# Patient Record
Sex: Male | Born: 1935 | Race: White | Hispanic: No | Marital: Married | Smoking: Former smoker
Health system: Southern US, Community
[De-identification: ages and names within clinical notes are randomized; demographics above are authoritative.]

## PROBLEM LIST (undated history)

## (undated) DIAGNOSIS — G4733 Obstructive sleep apnea (adult) (pediatric): Secondary | ICD-10-CM

## (undated) DIAGNOSIS — I10 Essential (primary) hypertension: Secondary | ICD-10-CM

## (undated) DIAGNOSIS — R600 Localized edema: Secondary | ICD-10-CM

## (undated) DIAGNOSIS — Z950 Presence of cardiac pacemaker: Secondary | ICD-10-CM

## (undated) DIAGNOSIS — I499 Cardiac arrhythmia, unspecified: Secondary | ICD-10-CM

## (undated) DIAGNOSIS — G709 Myoneural disorder, unspecified: Secondary | ICD-10-CM

## (undated) DIAGNOSIS — I2510 Atherosclerotic heart disease of native coronary artery without angina pectoris: Secondary | ICD-10-CM

## (undated) DIAGNOSIS — K5792 Diverticulitis of intestine, part unspecified, without perforation or abscess without bleeding: Secondary | ICD-10-CM

## (undated) DIAGNOSIS — K219 Gastro-esophageal reflux disease without esophagitis: Secondary | ICD-10-CM

## (undated) DIAGNOSIS — E079 Disorder of thyroid, unspecified: Secondary | ICD-10-CM

## (undated) DIAGNOSIS — D649 Anemia, unspecified: Secondary | ICD-10-CM

## (undated) DIAGNOSIS — M1990 Unspecified osteoarthritis, unspecified site: Secondary | ICD-10-CM

## (undated) DIAGNOSIS — E785 Hyperlipidemia, unspecified: Secondary | ICD-10-CM

## (undated) DIAGNOSIS — M26629 Arthralgia of temporomandibular joint, unspecified side: Secondary | ICD-10-CM

## (undated) DIAGNOSIS — D472 Monoclonal gammopathy: Secondary | ICD-10-CM

## (undated) DIAGNOSIS — F419 Anxiety disorder, unspecified: Secondary | ICD-10-CM

## (undated) DIAGNOSIS — E039 Hypothyroidism, unspecified: Secondary | ICD-10-CM

## (undated) HISTORY — DX: Obstructive sleep apnea (adult) (pediatric): G47.33

## (undated) HISTORY — DX: Atherosclerotic heart disease of native coronary artery without angina pectoris: I25.10

## (undated) HISTORY — DX: Diverticulitis of intestine, part unspecified, without perforation or abscess without bleeding: K57.92

## (undated) HISTORY — PX: INSERT / REPLACE / REMOVE PACEMAKER: SUR710

## (undated) HISTORY — PX: TONSILLECTOMY: SUR1361

## (undated) HISTORY — DX: Hyperlipidemia, unspecified: E78.5

## (undated) HISTORY — PX: HERNIA REPAIR: SHX51

## (undated) HISTORY — DX: Monoclonal gammopathy: D47.2

## (undated) HISTORY — PX: APPENDECTOMY: SHX54

## (undated) HISTORY — PX: JOINT REPLACEMENT: SHX530

## (undated) HISTORY — PX: CERVICAL LAMINECTOMY: SHX94

---

## 1998-02-19 ENCOUNTER — Ambulatory Visit (HOSPITAL_COMMUNITY): Admission: RE | Admit: 1998-02-19 | Discharge: 1998-02-19 | Payer: Self-pay | Admitting: Internal Medicine

## 1998-12-14 ENCOUNTER — Ambulatory Visit (HOSPITAL_COMMUNITY): Admission: RE | Admit: 1998-12-14 | Discharge: 1998-12-14 | Payer: Self-pay | Admitting: Internal Medicine

## 1998-12-18 ENCOUNTER — Encounter: Payer: Self-pay | Admitting: Cardiovascular Disease

## 1998-12-18 ENCOUNTER — Inpatient Hospital Stay (HOSPITAL_COMMUNITY): Admission: AD | Admit: 1998-12-18 | Discharge: 1998-12-24 | Payer: Self-pay | Admitting: Cardiovascular Disease

## 1998-12-19 ENCOUNTER — Encounter: Payer: Self-pay | Admitting: Cardiovascular Disease

## 1998-12-20 ENCOUNTER — Encounter: Payer: Self-pay | Admitting: Cardiovascular Disease

## 1998-12-21 ENCOUNTER — Encounter: Payer: Self-pay | Admitting: Cardiothoracic Surgery

## 1998-12-22 ENCOUNTER — Encounter: Payer: Self-pay | Admitting: Cardiothoracic Surgery

## 1999-01-08 ENCOUNTER — Encounter (HOSPITAL_COMMUNITY): Admission: RE | Admit: 1999-01-08 | Discharge: 1999-04-08 | Payer: Self-pay | Admitting: Cardiovascular Disease

## 1999-01-15 ENCOUNTER — Inpatient Hospital Stay (HOSPITAL_COMMUNITY): Admission: EM | Admit: 1999-01-15 | Discharge: 1999-01-17 | Payer: Self-pay | Admitting: Emergency Medicine

## 1999-04-09 ENCOUNTER — Encounter (HOSPITAL_COMMUNITY): Admission: RE | Admit: 1999-04-09 | Discharge: 1999-07-08 | Payer: Self-pay | Admitting: Cardiovascular Disease

## 1999-09-18 ENCOUNTER — Ambulatory Visit (HOSPITAL_COMMUNITY): Admission: RE | Admit: 1999-09-18 | Discharge: 1999-09-18 | Payer: Self-pay | Admitting: Internal Medicine

## 1999-10-03 ENCOUNTER — Ambulatory Visit (HOSPITAL_BASED_OUTPATIENT_CLINIC_OR_DEPARTMENT_OTHER): Admission: RE | Admit: 1999-10-03 | Discharge: 1999-10-03 | Payer: Self-pay | Admitting: General Surgery

## 1999-10-07 HISTORY — PX: CORONARY ARTERY BYPASS GRAFT: SHX141

## 2000-01-30 ENCOUNTER — Ambulatory Visit (HOSPITAL_COMMUNITY): Admission: RE | Admit: 2000-01-30 | Discharge: 2000-01-30 | Payer: Self-pay

## 2001-02-23 ENCOUNTER — Ambulatory Visit (HOSPITAL_COMMUNITY): Admission: RE | Admit: 2001-02-23 | Discharge: 2001-02-23 | Payer: Self-pay | Admitting: Internal Medicine

## 2001-07-29 ENCOUNTER — Encounter: Admission: RE | Admit: 2001-07-29 | Discharge: 2001-07-29 | Payer: Self-pay | Admitting: Internal Medicine

## 2001-07-29 ENCOUNTER — Encounter: Payer: Self-pay | Admitting: Internal Medicine

## 2001-09-22 ENCOUNTER — Encounter: Payer: Self-pay | Admitting: Internal Medicine

## 2001-09-22 ENCOUNTER — Encounter: Admission: RE | Admit: 2001-09-22 | Discharge: 2001-09-22 | Payer: Self-pay | Admitting: Internal Medicine

## 2002-03-17 ENCOUNTER — Encounter: Payer: Self-pay | Admitting: Internal Medicine

## 2002-03-17 ENCOUNTER — Encounter: Admission: RE | Admit: 2002-03-17 | Discharge: 2002-03-17 | Payer: Self-pay | Admitting: Internal Medicine

## 2002-08-23 ENCOUNTER — Encounter: Payer: Self-pay | Admitting: Orthopedic Surgery

## 2002-08-23 ENCOUNTER — Encounter: Admission: RE | Admit: 2002-08-23 | Discharge: 2002-08-23 | Payer: Self-pay | Admitting: Orthopedic Surgery

## 2002-08-25 ENCOUNTER — Ambulatory Visit (HOSPITAL_COMMUNITY): Admission: RE | Admit: 2002-08-25 | Discharge: 2002-08-25 | Payer: Self-pay | Admitting: Gastroenterology

## 2003-06-26 ENCOUNTER — Emergency Department (HOSPITAL_COMMUNITY): Admission: EM | Admit: 2003-06-26 | Discharge: 2003-06-26 | Payer: Self-pay | Admitting: Emergency Medicine

## 2003-06-26 ENCOUNTER — Encounter: Payer: Self-pay | Admitting: Emergency Medicine

## 2003-12-29 ENCOUNTER — Emergency Department (HOSPITAL_COMMUNITY): Admission: EM | Admit: 2003-12-29 | Discharge: 2003-12-29 | Payer: Self-pay | Admitting: Family Medicine

## 2005-09-26 ENCOUNTER — Encounter: Admission: RE | Admit: 2005-09-26 | Discharge: 2005-09-26 | Payer: Self-pay | Admitting: Internal Medicine

## 2006-03-11 ENCOUNTER — Encounter: Admission: RE | Admit: 2006-03-11 | Discharge: 2006-03-11 | Payer: Self-pay | Admitting: Gastroenterology

## 2006-12-15 ENCOUNTER — Inpatient Hospital Stay (HOSPITAL_COMMUNITY): Admission: EM | Admit: 2006-12-15 | Discharge: 2006-12-16 | Payer: Self-pay | Admitting: Emergency Medicine

## 2006-12-15 DIAGNOSIS — Z950 Presence of cardiac pacemaker: Secondary | ICD-10-CM

## 2006-12-15 HISTORY — DX: Presence of cardiac pacemaker: Z95.0

## 2006-12-15 HISTORY — PX: PACEMAKER INSERTION: SHX728

## 2007-01-26 ENCOUNTER — Emergency Department (HOSPITAL_COMMUNITY): Admission: EM | Admit: 2007-01-26 | Discharge: 2007-01-26 | Payer: Self-pay | Admitting: Emergency Medicine

## 2007-12-28 HISTORY — PX: US ECHOCARDIOGRAPHY: HXRAD669

## 2008-01-20 ENCOUNTER — Encounter: Admission: RE | Admit: 2008-01-20 | Discharge: 2008-01-20 | Payer: Self-pay | Admitting: Cardiovascular Disease

## 2008-01-24 ENCOUNTER — Ambulatory Visit (HOSPITAL_COMMUNITY): Admission: RE | Admit: 2008-01-24 | Discharge: 2008-01-24 | Payer: Self-pay | Admitting: *Deleted

## 2008-03-26 ENCOUNTER — Inpatient Hospital Stay (HOSPITAL_COMMUNITY): Admission: EM | Admit: 2008-03-26 | Discharge: 2008-03-29 | Payer: Self-pay | Admitting: Emergency Medicine

## 2008-03-28 HISTORY — PX: CARDIAC CATHETERIZATION: SHX172

## 2008-04-04 ENCOUNTER — Encounter (INDEPENDENT_AMBULATORY_CARE_PROVIDER_SITE_OTHER): Payer: Self-pay | Admitting: Cardiology

## 2008-04-04 ENCOUNTER — Ambulatory Visit: Payer: Self-pay | Admitting: Vascular Surgery

## 2008-04-04 ENCOUNTER — Ambulatory Visit (HOSPITAL_COMMUNITY): Admission: RE | Admit: 2008-04-04 | Discharge: 2008-04-04 | Payer: Self-pay | Admitting: Cardiology

## 2009-06-17 ENCOUNTER — Emergency Department (HOSPITAL_COMMUNITY): Admission: EM | Admit: 2009-06-17 | Discharge: 2009-06-17 | Payer: Self-pay | Admitting: Emergency Medicine

## 2009-12-17 ENCOUNTER — Encounter: Admission: RE | Admit: 2009-12-17 | Discharge: 2009-12-17 | Payer: Self-pay | Admitting: Orthopedic Surgery

## 2010-03-06 ENCOUNTER — Encounter (HOSPITAL_COMMUNITY): Admission: RE | Admit: 2010-03-06 | Discharge: 2010-05-08 | Payer: Self-pay | Admitting: Internal Medicine

## 2010-07-22 ENCOUNTER — Inpatient Hospital Stay (HOSPITAL_COMMUNITY): Admission: RE | Admit: 2010-07-22 | Discharge: 2010-07-26 | Payer: Self-pay | Admitting: Orthopedic Surgery

## 2010-12-18 LAB — BASIC METABOLIC PANEL
BUN: 9 mg/dL (ref 6–23)
CO2: 31 mEq/L (ref 19–32)
Calcium: 8.2 mg/dL — ABNORMAL LOW (ref 8.4–10.5)
Chloride: 100 mEq/L (ref 96–112)
GFR calc Af Amer: >60 mL/min (ref >60)
GFR calc Af Amer: >60 mL/min (ref >60)
GFR calc non Af Amer: >60 mL/min (ref >60)
Potassium: 5 mEq/L (ref 3.5–5.1)
Sodium: 134 mEq/L — ABNORMAL LOW (ref 135–145)
Sodium: 136 mEq/L (ref 135–145)

## 2010-12-18 LAB — CBC
HCT: 31.5 % — ABNORMAL LOW (ref 39.0–52.0)
HCT: 32.4 % — ABNORMAL LOW (ref 39.0–52.0)
Hemoglobin: 10.9 g/dL — ABNORMAL LOW (ref 13.0–17.0)
Hemoglobin: 11.1 g/dL — ABNORMAL LOW (ref 13.0–17.0)
Hemoglobin: 12 g/dL — ABNORMAL LOW (ref 13.0–17.0)
MCH: 36.4 pg — ABNORMAL HIGH (ref 26.0–34.0)
MCV: 105.2 fL — ABNORMAL HIGH (ref 78.0–100.0)
Platelets: 184 10*3/uL (ref 150–400)
RBC: 2.99 MIL/uL — ABNORMAL LOW (ref 4.22–5.81)
RBC: 3.08 MIL/uL — ABNORMAL LOW (ref 4.22–5.81)
RBC: 3.3 MIL/uL — ABNORMAL LOW (ref 4.22–5.81)
WBC: 7.5 10*3/uL (ref 4.0–10.5)
WBC: 9 10*3/uL (ref 4.0–10.5)
WBC: 9.1 10*3/uL (ref 4.0–10.5)

## 2010-12-18 LAB — PROTIME-INR
INR: 1.16 (ref 0.00–1.49)
INR: 1.17 (ref 0.00–1.49)
INR: 1.18 (ref 0.00–1.49)
INR: 1.27 (ref 0.00–1.49)
Prothrombin Time: 15 seconds (ref 11.6–15.2)
Prothrombin Time: 16.1 seconds — ABNORMAL HIGH (ref 11.6–15.2)

## 2010-12-18 LAB — TYPE AND SCREEN
ABO/RH(D): O POS
Antibody Screen: NEGATIVE

## 2010-12-18 LAB — MAGNESIUM: Magnesium: 1.9 mg/dL (ref 1.5–2.5)

## 2010-12-18 LAB — APTT: aPTT: 34 seconds (ref 24–37)

## 2010-12-19 LAB — CBC
MCV: 104.7 fL — ABNORMAL HIGH (ref 78.0–100.0)
Platelets: 207 10*3/uL (ref 150–400)
RDW: 13.7 % (ref 11.5–15.5)
WBC: 7.3 10*3/uL (ref 4.0–10.5)

## 2010-12-19 LAB — COMPREHENSIVE METABOLIC PANEL
AST: 31 U/L (ref 0–37)
Albumin: 3.6 g/dL (ref 3.5–5.2)
Alkaline Phosphatase: 68 U/L (ref 39–117)
BUN: 17 mg/dL (ref 6–23)
GFR calc Af Amer: >60 mL/min (ref >60)
Potassium: 4.9 mEq/L (ref 3.5–5.1)
Sodium: 139 mEq/L (ref 135–145)
Total Protein: 7 g/dL (ref 6.0–8.3)

## 2010-12-19 LAB — URINALYSIS, ROUTINE W REFLEX MICROSCOPIC
Nitrite: NEGATIVE
Specific Gravity, Urine: 1.021 (ref 1.005–1.030)
Urobilinogen, UA: 0.2 mg/dL (ref 0.0–1.0)

## 2010-12-19 LAB — URINE MICROSCOPIC-ADD ON

## 2010-12-19 LAB — PROTIME-INR: INR: 1.84 — ABNORMAL HIGH (ref 0.00–1.49)

## 2011-02-18 NOTE — Cardiovascular Report (Signed)
NAMELORENCE, NAGENGAST NO.:  [id number]   MEDICAL RECORD NO.:  [id number]          PATIENT TYPE:  OIB   LOCATION:  2899                         FACILITY:  [facility]   PHYSICIAN:  Darlin Priestly, MD  DATE OF BIRTH:  Apr 06, 1935   DATE OF PROCEDURE:  DATE OF DISCHARGE:  01/24/2008                            CARDIAC CATHETERIZATION   PROCEDURE:  DC cardioversion.   COMPLICATION:  None.   INDICATION:  Mr. Vanderwoude is a 76 year old male, patient of Dr. Nanetta Batty and Dr. Nila Nephew, with a history of CED, status post mild  bypass surgery with abnormal complete heart block, status post dual  chamber pacer implant.  He was recently found to be in atrial flutter by  Dr. Allyson Sabal.  He was subsequently __________ been on Coumadin and has had  greater than 6 weeks of therapeutic INRs. Of note, his INR today is 1.9.  I did a lengthy discussion with Mr. Stell as well as his wife regarding  his INR of 1.9.  We allowed to proceed with a DC cardioversion with 1  dose of subcu Lovenox and increased Coumadin level with a repeat INR  tomorrow.   DESCRIPTION OF PROCEDURE:  After getting informed consent, the patient  was brought to the short stay area.  The patient's pacer was  interrogated.  He was found to be in atrial flutter for approximately 70  days.  He then underwent successful and uncomplicated DC cardioversion  with 150 joule shock with restoration of sinus rhythm.  His pacemaker  was again interrogated and he was programmed to MVP with atrial  preference of pacing.  He woke in satisfactory condition.   CONCLUSION:  Successful DC cardioversion with restoration of sinus  rhythm.  He did undergo a pacer reprogramming for atrial preference in  MVP mode.      Darlin Priestly, MD  Electronically Signed     RHM/MEDQ  D:  01/24/2008  T:  01/25/2008  Job:  [id number]   cc:   Nanetta Batty, M.D.  Erskine Speed, M.D.

## 2011-02-18 NOTE — Cardiovascular Report (Signed)
Mike Price, SCHUPP NO.:  [id number]   MEDICAL RECORD NO.:  [id number]          PATIENT TYPE:  AMB   LOCATION:  SDS                          FACILITY:  [facility]   PHYSICIAN:  Nanetta Batty, M.D.   DATE OF BIRTH:  Sep 09, 1935   DATE OF PROCEDURE:  DATE OF DISCHARGE:                            CARDIAC CATHETERIZATION   HISTORY OF PRESENT ILLNESS:  Mr. Brue is a 76 year old gentleman with  history of coronary bypass grafting in the past.  He has PAF and is on  Coumadin anticoagulation.  He has had a pacemaker placed as well in the  past.  He recently was cathed and was found to have noncritical CAD with  patent grafts.  In followup in the office, he was noted to have a mass  in his groin with an auscultated bruit.  Duplex ultrasound in our office  revealed a femoral pseudoaneurysm measuring about 2-1/2 cm with a long  neck.  The patient is scheduled to go out of country this weekend and he  is brought in today for elective ultrasound-guided thrombin injection of  pseudoaneurysm.  Dr. Yates Decamp and I performed the procedures together.   The patient was premedicated with Dilaudid IV 1 mg.  His groin was  sterilized with Betadine swabs.  The pseudoaneurysm was localized with  duplex ultrasound.  The groin was anesthetized with 1% Xylocaine.  A 20-  gauge lumbar puncture needle was then used to ultrasonically enter the  pseudoaneurysm pouch and position was verified with injection of saline.  Following this, approximately 1000 units of thrombin was slowly injected  with visualization of thrombosis of the remaining patent portion of the  pseudoaneurysm.  The patient immediately related improvement in  symptoms.  He had a dopplerable pedal pulse at the end of procedure.   IMPRESSION:  Successful ultrasonically-guided thrombin injection of a  femoral pseudoaneurysm, status post cardiac catheterization.  The  patient's INR yesterday was 1.4 and his Coumadin dose was  adjusted.  He  will be discharged home later today and will be seen back in the office  once he returns from his oversees trip.      Nanetta Batty, M.D.  Electronically Signed     JB/MEDQ  D:  04/04/2008  T:  04/05/2008  Job:  [id number]   cc:   [facility]  [place] R. Jacinto Halim, MD

## 2011-02-18 NOTE — H&P (Signed)
Mike Price, Mike Price NO.:  [id number]   MEDICAL RECORD NO.:  [id number]          PATIENT TYPE:  EMS   LOCATION:  MAJO                         FACILITY:  [facility]   PHYSICIAN:  Mike Price, M.D.   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  His last catheterization was in March 2008.  This revealed a patent SVG to the diagonal, patent SVG to the ramus  intermedius, patent SVG to the PDA, patent LIMA to the LAD with good LV  function.  He recently had an outpatient Myoview, which showed new  subtle inferolateral ischemia, which was a change from his previous  Myoview from August 2007.  Mike Price was going to repeat this in a year.  The patient had been doing well with no symptoms and had had a recent  catheterization in March 2008 showing widely patent grafts and native  arteries.  The patient also had an echocardiogram recently, which showed  normal LV function.  He has a history of PAF and had a DC cardioversion  in April 2009 to sinus rhythm.  He has also had syncope in the past and  had a Medtronic pacemaker implanted in March 2008 for complete heart  block and syncope.  He just saw Mike Price January 07, 2008.  He called the  service this morning.  He had just eaten breakfast when he had an  episode where he felt flushed and had some vague anxiety feeling in his  chest.  He then broke out in a cold sweat.  He did not have chest pain  or chest tightness or shortness of breath.  He denies any palpitations  or tachycardia.  His main complaint was a feeling of being flushed.  He  is seen now in the emergency room.  Since he has been in the emergency  room, he has had another one of these episodes although unfortunately it  was on the way to x-ray and he was not monitored.  It was a mild  episode  and resolved quickly.  He is admitted now for observation and further  evaluation.   His past medical history is remarkable as noted above.  He had bypass  surgery in 2000 with patent grafts in March 2008.  He had a complete  heart block and syncope in March 2008 and had a Medtronic pacemaker  implanted.  He has preserved LV function.  He has PAF and had a DC  cardioversion in April 2009.  He has had a questionable diagnosis of  diverticulosis.  Apparently saw Mike Price, who told him he  actually probably was hypersensitive.  He has had previous neck surgery.   His current medications are:  1. Aspirin 81 mg a day.  2. Metoprolol 50 mg b.i.d.  3. Prilosec 20 mg b.i.d.  4. Zetia 10 mg a day.  5. Lipitor 40 mg a day.  6. Lisinopril  20 mg a day.  7. Clonazepam 0.25 b.i.d.   He has no known drug allergies.   SOCIAL HISTORY:  He is married.  Has 1 daughter and 6 grandchildren.  He  has not smoked in 20 years.  He is a retired Tourist information centre manager.  He was  at [employer] and [employer] for 20 years.   His family history is remarkable in that his mother died of heart  trouble in her 71s.  His father lived well into his 62s.  He has a  brother with hypertension.   REVIEW OF SYSTEMS:  Essentially unremarkable except for noted above.  He  has been told he has been anemic in the past, and Mike Price has him on  iron.  He has not had GI bleeding or melena or peptic ulcer disease.  He  has had remote prostatitis but no symptoms recently.  He says he  recently had a prostate exam and PSA by Mike Price and this was normal.  He denies any thyroid problems or diabetes.  He has not had any  tachycardia or syncope.   PHYSICAL EXAMINATION:  VITAL SIGNS:  Blood pressure 142/77, pulse 70,  temperature 98.4.  GENERAL:  He is a well-developed, well-nourished male in no acute  distress.  HEENT:  Normocephalic.  Extraocular movements are intact.  Sclerae are  nonicteric.  He wears  glasses.  NECK:  Without JVD or bruit.  Thyroid is not enlarged.  CHEST:  Clear to auscultation and percussion.  CARDIAC:  Reveals regular rate and rhythm without murmur, rub or gallop.  Normal S1 and S2.  ABDOMEN:  Nontender.  No hepatosplenomegaly.  EXTREMITIES:  Without edema.  Distal pulses are intact.  There are no  femoral artery bruits noted.  NEURO:  Exam is grossly intact.  He is awake, alert and oriented,  cooperative.  Moves all extremities without obvious deficit.  SKIN:  Warm and dry.   LABORATORIES:  White count 7.0, hemoglobin 15.2, hematocrit 43.6,  platelets 201.  Sodium 134, potassium 4.7, BUN 17, creatinine 0.95,  troponin 0.01, INR 1.9.  EKG is paced.   IMPRESSION:  1. Vague presyncopal spell with diaphoresis, rule out arrhythmia, rule      out myocardial infarction.  2. Known coronary disease, coronary bypass grafting in 2000 with      patent grafts March 2008.  3. Normal left ventricular function.  4. Paroxysmal atrial fibrillation.  Direct current cardioversion April      2009.  5. Coumadin therapy.  6. Treated hypertension.  7. Treated dyslipidemia.  Zetia was recently added   PLAN:  The patient will be admitted to telemetry for observation and to  rule out MI.      Mike Price, P.A.      Mike Price, M.D.  Electronically Signed    LKK/MEDQ  D:  03/26/2008  T:  03/26/2008  Job:  [id number]

## 2011-02-21 NOTE — Op Note (Signed)
[facility]  Patient:    Mike Price                       MRN: [id number] Proc. Date: 10/03/99 Adm. Date:  [id number] Attending:  Henrene Dodge CC:         Anselm Pancoast. Zachery Dakins, M.D.             Richard A. Alanda Amass, M.D.                           Operative Report  PREOPERATIVE DIAGNOSIS:  Large right inguinal hernia.  POSTOPERATIVE DIAGNOSIS:  Large direct right inguinal hernia.  OPERATION PERFORMED:  Right inguinal herniorrhaphy with mesh.  SURGEON:  Anselm Pancoast. Zachery Dakins, M.D.  ANESTHESIA:  Local with sedation.  INDICATIONS FOR PROCEDURE:  Mike Price is a 76 year old Caucasian male who I was asked to repair a right inguinal hernia by Dr. Jamey Ripa.  The patient has seen Dr. Jamey Ripa about two months ago and desired to have his hernia repaired after Christmas because of his family responsibilities with his wife.  Dr. Jamey Ripa is going to be on vacation and I was in agreement.  The patient had been on Coumadin.  Dr. Alanda Amass is his regular physician and we elected to discontinue the Coumadin four days ago.   DESCRIPTION OF PROCEDURE:  The patient was given Kefzol and taken to the operating room.  The right inguinal area was first clipped and then prepped with Betadine  surgical scrub and solution and draped in a sterile manner.  The inguinal incision area was infiltrated with a mixture of 0.5% plain Xylocaine and 0.25% Marcaine.  The ilioinguinal nerve area was infiltrated at the anterior iliac crest area with approximately 10 cc of the same solution using a blunted 21 gauge needle. Sharp dissection down through the skin, subcutaneous, Scarpas fascia, the external oblique was identified, the external ring then really stretched because of this  large hernia.  I opened the external oblique, identified the ilioinguinal nerve, did not actually separate it from the cord structures but you could see this large hernia sac.   At first I thought it was an indirect sac but really it was a large direct sac that we separated from the preperitoneal fat, placed it back in its normal position and opened up the large defect and then did a sort of a modified Shouldice type repair first, recreating the internal ring.  This was done with -0 Prolene and then after the two ends had been tied together I then fashioned a piece of Prolene mesh up like a sail slit laterally and placed it over the repair suturing the inferior limb with the inguinal ligament with a running 2-0 Prolene and the superior limb with the interrupted sutures to the anterior rectus fascia and lateral internal oblique muscle area.  Two tails had been sutured together nd then the external oblique was closed with a running 2-0 and 3-0 Vicryl. Scarpas fascia was closed with interrupted 3-0 Vicryl and subcuticular 4-0 nylon, 4-0 Vicryl placed and then benzoin and Steri-Strips on the skin.  The patient tolerated the procedure nicely and was awake at completion of surgery and will be released after a short stayin the recovery room. I will see him in the office in approximately a week.  I think he can resume his Coumadin today, probably be three or four days before he is therapeutic and there was  no excessive bleeding. DD:  10/03/99 TD:  10/04/99 Job: [id number]

## 2011-02-21 NOTE — Cardiovascular Report (Signed)
NAMEGLENDEN, Mike Price NO.:  [id number]   MEDICAL RECORD NO.:  [id number]          PATIENT TYPE:  INP   LOCATION:  2004                         FACILITY:  [facility]   PHYSICIAN:  Thereasa Solo. Little, M.D. DATE OF BIRTH:  1934-12-03   DATE OF PROCEDURE:  DATE OF DISCHARGE:  12/15/2006                            CARDIAC CATHETERIZATION   INDICATIONS FOR TEST:  This 75 year old male had bypass surgery in 2000.  He presented to the emergency room on the morning of this dictation  after having had a syncopal episode while on the toilet.  In the  emergency room, he was in third degree AV block.  He had a heart rate in  the 30s, but a blood pressure in the 140-150 range.   He is brought to the cath lab for cardiac catheterization to make sure  he is not having ongoing ischemia, and with plans for a permanent  pacemaker to be placed following his cath.   After obtaining informed consent, the patient was prepped and draped in  the usual sterile fashion exposing the right groin.  Following local  anesthetic with 1% Xylocaine, the Seldinger technique was employed and a  5-French introducer sheath was placed in the right femoral artery.  Left  and right coronary arteriography and graft visualization was performed.  Attempts at a left ventriculogram resulted in a 10+ second pause when  the pigtail catheter crossed the aortic valve.  The patient had external  pads in place, but was able to cough himself back into a rate of about  25.  Because of this, a temporary pacemaker was placed via the right  femoral vein through a 6-French sheath into the apex of the RV.  Good  capture was obtained to __________ .   EQUIPMENT:  5-French Judkins configuration diagnostic catheter with  internal mammary artery catheter was used to engage the IMA.   TOTAL CONTRAST:  85 cc.   HEMODYNAMIC MONITORING:  Central aortic pressure was 140/58, heart rate  was a ventricular escape rate at between 28  and 32.  He had one episode  as long as 15-second pause.  He had no response atropine.   PROCEDURE:  Temporary pacemaker into the apex of the RV using a 6-French  introducer sheath and a 5-French pacer was successfully placed.   CORONARY ARTERIOGRAPHY:  1. Left main was normal proximally but had distal 30% narrowing.      Circumflex - the AV groove circumflex was small with small OMs.  2. LAD.  The LAD had bidirectional flow in its midportion.  3. Right coronary artery 100% percent occluded.  4. Grafts.  Saphenous vein graft to the diagonal graft was widely      patent.  The diagonal was widely patent.  5. Saphenous vein grafts to the optional diagonal/ramus intermediate      graft widely patent; optional diagonal widely patent.  6. Saphenous vein graft to the PDA.  The graft was patent.  The PDA      was patent.  7. Internal mammary artery to the LAD.  Internal mammary artery was  widely patent.  The LAD was small with some minor irregularities      proximally just distal to the insertion site.   CONCLUSION:  1. Patent grafts.  2. Third degree AV block.   Ejection fraction by nuclear study June of 2007 was 67%.  In view of  this, the patient does not need an AICD, but strictly a dual chamber  pacemaker.           ______________________________  Thereasa Solo Little, M.D.     ABL/MEDQ  D:  12/15/2006  T:  12/17/2006  Job:  [id number]   cc:   Nanetta Batty, M.D.  Erskine Speed, M.D.

## 2011-02-21 NOTE — Discharge Summary (Signed)
Mike Price, Mike Price NO.:  [id number]   MEDICAL RECORD NO.:  [id number]          PATIENT TYPE:  INP   LOCATION:  3704                         FACILITY:  [facility]   PHYSICIAN:  Nanetta Batty, M.D.   DATE OF BIRTH:  Feb 27, 1935   DATE OF ADMISSION:  03/26/2008  DATE OF DISCHARGE:  03/29/2008                               DISCHARGE SUMMARY   DISCHARGE DIAGNOSES:  1. Presyncopal spell, catheterization this admission showing no      progression of coronary artery disease and patent grafts.  2. Preserved left ventricular function.  3. Coronary artery bypass grafting in 2000.  4. Paroxysmal atrial fibrillation, direct-current cardioversion, April      of 2009 and a sinus rhythm with Coumadin therapy.  5. Complete heart block and syncope in the past, status post Medtronic      pacemaker March 2008.  6. Treated hypertension.  7. Dyslipidemia.   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  He was cathed in March 2008 and had normal LV function and patent  grafts.  He underwent Medtronic pacemaker implant in March 2008, for  complete heart block and syncope.  He subsequently has had PAF and was  cardioverted in April 2009 to sinus rhythm.  He is admitted through the  emergency room on March 26, 2008, after a flushed spell after  breakfast.  He denies any chest pain or shortness of breath or nausea.  The patient was seen by Dr. Mariah Milling and admitted to telemetry.  He was  started on IV heparin and telemetry.  Pacemaker was interrogated and  showed no arrhythmias.  After discussion with Dr. Allyson Sabal, it was decided  to proceed with diagnostic catheterization.  This was done March 28, 2008, showing a patent vein graft to the RCA, patent LIMA to the LAD,  patent SVG to the ramus intermedius, and a patent SVG to the diagonal.  The native circumflex was without significant disease.  Dr.  Allyson Sabal feels  that the patient can be discharged on March 29, 2008, will resume his  Coumadin.   DISCHARGE MEDICATIONS:  1. Aspirin 81 mg a day.  2. Metoprolol 50 mg twice a day.  3. Lisinopril 10 mg twice a day.  4. Omeprazole 20 mg a day.  5. Clonazepam 2.5 mg twice a day.  6. Iron; Monday, Wednesday, and Friday.  7. Lipitor 40 mg a day.  8. Multivitamin daily.  9. Coumadin 7.5 on Monday, Wednesday, and Friday and 5 mg on Tuesday,      Thursday, Saturday, and Sunday.  10.Zetia 10 mg a day.   LABS:  EKG shows paced rhythm; white count 6.7, hemoglobin 13.7,  hematocrit 39.8, and platelets 164.  INR on admission was 1.9, INR at  discharge 1.5.  Sodium 138, potassium 4.2, BUN 12, and creatinine 0.9.  LFTs were normal.  CK-MB and troponins were negative x3.  TSH of 3.37.   DISPOSITION:  The patient is discharged in stable condition.  He will  follow up  with Dr. Allyson Sabal in a couple of weeks.  He will have a protime  checked in a week.      Abelino Derrick, P.A.      Nanetta Batty, M.D.  Electronically Signed    LKK/MEDQ  D:  04/12/2008  T:  04/13/2008  Job:  [id number]   cc:   Erskine Speed, M.D.

## 2011-02-21 NOTE — Discharge Summary (Signed)
NAMEVANESSA, ALESI NO.:  [id number]   MEDICAL RECORD NO.:  [id number]          PATIENT TYPE:  INP   LOCATION:  2004                         FACILITY:  [facility]   PHYSICIAN:  Darlin Priestly, MD  DATE OF BIRTH:  02-14-1935   DATE OF ADMISSION:  12/15/2006  DATE OF DISCHARGE:  12/16/2006                               DISCHARGE SUMMARY   DISCHARGE DIAGNOSES:  1. Syncope.  2. Patient with heart block, Mobitz II.  3. Placement of a permanent intravenous DDR Medtronic pacemaker.  4. History of coronary disease with bypass grafting.      a.     Cardiac cath with patent grafts and temporary pacer placed.  5. Hyperlipidemia.   DISCHARGE CONDITION:  Improved.   PROCEDURES:  1. December 15, 2006, cardiac catheterization, left cores and graft by      Dr. Julieanne Manson.  2. December 15, 2006, placement of temporary pacemaker by Dr. Julieanne Manson.  3. December 15, 2006, placement of a Medtronic Adapta permanent      pacemaker, serial number [id number], placed by Dr. Lenise Herald.   DISCHARGE MEDICATIONS:  1. Metoprolol 50 mg a half tab twice a day.  2. Lipitor 40 mg daily.  3. Enteric-coated aspirin 81 mg daily beginning Friday.  4. Clonazepam 0.25 mg twice a day.   DISCHARGE INSTRUCTIONS:  1. Increase activity slowly.  2. See pacemaker sheet for arm movements.  3. A low-sodium heart healthy diet.  4. Wound care as per pacemaker sheet, i.e. no showers for a week.  Do      not remove Steri-Strips.  5. Wash right groin cath site with soap and water, call if bleeding,      swelling or drainage.  6. Follow up with Dr. Jenne Campus, December 24, 2006, at noon.  7. Follow up with Dr. Allyson Sabal, January 04, 2007 at 11 a.m.   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  After  sitting on his toilet  and then found himself to be on his knees in front of the toilet, was  feeling lightheaded, a questionable syncopal episode.  His wife brought  him to the emergency room and was found in 2:1 block, Mobitz II, heart  rate in the 30s, blood pressure fortunately was 140 systolic.  He has  right bundle branch block, left anterior hemi-block, trifascicular block  and symptomatic.  No chest pain.   PAST MEDICAL HISTORY:  1. Bypass grafting in 2000 with left internal mammary artery to the      left anterior descending, saphenous vein graft to the diagonal      branch and ramus and posterior descending artery.  Negative      Cardiolite in 2007.  2. History of right bundle branch block.  3. He had some paroxysmal atrial fibrillation postoperatively, but      none since.  4.  History of right inguinal hernia repair.   OUTPATIENT MEDICATIONS:  1. Metoprolol 25 b.i.d.  2. Baby aspirin daily.  3. Multivitamin daily.  4. Lipitor 40 daily.  5. Clonazepam 0.5 mg daily.   ALLERGIES:  ANTIMALARIAL.   FAMILY HISTORY:  See H&P.   SOCIAL HISTORY:  See H&P.   REVIEW OF SYSTEMS:  See H&P.   PHYSICAL EXAM AT DISCHARGE:  VITAL SIGNS:  Blood pressure 122/62.  Pulse  70.  Respiratory rate is 18.  Temp 98.5.  Oxygen saturation 92%.  GENERAL:  Alert and oriented white male.  PACER SITE:  Not swollen.  Nontender.  No bleeding.  Steri-Strips were  intact.  HEART:  Regular rate and rhythm.  No murmur.  LUNGS:  Clear.  EXTREMITIES:  No lower extremity edema.   Please note, prior to his discharge home, he did have an episode went up  to the 120s and he had not had his beta-blocker, so we added that back  prior to a discharge home.  He would call us if he had further problems.   LABORATORY DATA:  Hemoglobin 12.2, hematocrit 37.6, WBC 10.9 on  admission, platelets 300, neutrophils 53, lymph 34, mono 10, eos 3,  basos 0.  Prior to discharge, his WBC was 8.0, hemoglobin 10.3,   hematocrit 31.4 felt to be due to blood loss secondary to pacer,  platelets 241.  Pro time, on admission 12.7, INR of 0.9, PTT 32.0.   Chemistry:  Sodium 136, potassium 4.3, chloride 105, CO2 26, glucose 95-  112, BUN 17, creatinine 0.94.   Cardiac enzymes were negative.  CK 89.  MB 2.0.  Troponin-I 0.03.  TSH  4.042.   Chest x-ray, on admission, stable mild cardiomegaly.  No acute  cardiopulmonary process.  Followup on the 12th of March, status post  left permanent pacemaker placement, no evidence for pneumo or effusion.   EKG:  I do not see the initial EKG in the chart from the emergency room.  There is a right bundle branch block and Mobitz II.  Followup with  temporary pacer, he is ventricular pacing and on March 12, he is AV  sequentially pacing.   HOSPITAL COURSE:  Patient was admitted after a syncope episode at home  and found to be 2:1 Mobitz block, Mobitz 2 with heart rate in the 30s.  Blood pressure was stable.  He was right bundle branch block, left  anterior hemi-block and first degree block, trifascicular block.  He was  taken to the cath lab to evaluate for ischemia prior to pacemaker.  His  cardiac cath revealed patent grafts.  He did have difficulty with  attempting to LV gram.  He would have 10 seconds of asystole and Dr.  Clarene Duke placed a temporary pacer.  He did have some third degree block by  the time he was in the cath lab.  He underwent permanent pacemaker  placement by Dr. Jenne Campus shortly thereafter and tolerated the procedure  well, it was placed on his left chest, left subclavian area.  By March  12, he was stable without further problems and ready for discharge home,  except for his tachycardia, which will be monitored as an outpatient,  but most likely off of his Lopressor.  He was seen and discharged by Dr.  Elsie Lincoln.      Darcella Gasman. Valarie Merino      Darlin Priestly, MD  Electronically Signed    LRI/MEDQ  D:  01/25/2007  T:  01/25/2007  Job:   [id number]   cc:   Nanetta Batty, M.D.  Erskine Speed, M.D.

## 2011-02-21 NOTE — Op Note (Signed)
   NAME:  Mike Price, Mike Price                         ACCOUNT NO.:  [id number]   MEDICAL RECORD NO.:  [id number]                   PATIENT TYPE:  AMB   LOCATION:  ENDO                                 FACILITY:  [facility]   PHYSICIAN:  Danise Edge, M.D.                DATE OF BIRTH:  [birth year]   DATE OF PROCEDURE:  08/25/2002  DATE OF DISCHARGE:                                 OPERATIVE REPORT   PROCEDURE PERFORMED:  Colonoscopy.   REFERRING PHYSICIAN:  Erskine Speed, M.D.   ENDOSCOPIST:  Charolett Bumpers, M.D.   INDICATIONS FOR PROCEDURE:  The patient is a 76 year old male who had left  upper quadrant abdominal discomfort.  He has completed a course of Cipro for  suspected acute diverticulitis.   PREMEDICATION:  Demerol 70 mg, Versed 7 mg.   INSTRUMENT USED:  Pediatric Olympus colonoscope.   DESCRIPTION OF PROCEDURE:  After obtaining informed consent, the patient was  placed in the left lateral decubitus position.  I administered intravenous  Demerol and intravenous Versed to achieve conscious sedation for the  procedure.  The patient's blood pressure, oxygen saturations and cardiac  rhythm were monitored throughout the procedure and documented in the medical  record.   Anal inspection was normal.  Digital rectal exam revealed a non nodular  prostate.  The pediatric Olympus video colonoscope was introduced into the  rectum and easily advanced to the cecum.  Colonic preparation for the exam  today was satisfactory.   Rectum:  Normal.   Sigmoid colon and descending colon:  Left colonic diverticulosis.   Splenic flexure:  Normal.   Transverse colon:  Normal.   Hepatic flexure:  Normal.   Ascending colon:  Normal.   Ileocecal valve and cecum:  Normal.    ASSESSMENT:  Left colonic diverticulosis; otherwise normal proctocolonoscopy  to the cecum.  No endoscopic evidence  for the presence of colorectal  neoplasia.       Danise Edge, M.D.    MJ/MEDQ  D:  08/25/2002  T:  08/25/2002  Job:  [id number]   cc:   Erskine Speed, M.D.  [address], [address]  [place]  [place] [address]  Fax: [phone]

## 2011-02-21 NOTE — Op Note (Signed)
NAMEJOHNN, KRASOWSKI NO.:  [id number]   MEDICAL RECORD NO.:  [id number]          PATIENT TYPE:  INP   LOCATION:  2004                         FACILITY:  [facility]   PHYSICIAN:  Darlin Priestly, MD  DATE OF BIRTH:  06-18-1935   DATE OF PROCEDURE:  12/15/2006  DATE OF DISCHARGE:  12/16/2006                               OPERATIVE REPORT   PROCEDURE:  Insertion of a Medtronic Adapta generator, model ADDR01,  serial # [id number], with active atrial and ventricular leads.   ATTENDING SURGEON:  Darlin Priestly, MD.   COMPLICATIONS:  None.   INDICATIONS:  Mr. Goodrich is a 76 year old male patient of Dr. Nila Nephew  and Dr. Nanetta Batty with a history of CAD, status post coronary  artery bypass surgery in 2000, history of right bundle branch block with  left anterior fascicular block.  He was admitted through the ER on  08/14/07 after a syncopal episode, and was found to be in second a third-  degree A-V heart block with ventricular rates in the 30s in the ER.  He  underwent cardiac catheterization by Dr. Caprice Kluver, revealing a widely  patent LIMA to the LAD, a patient vein graft to the diagonal, a patent  vein graft to the ramus and a patent vein graft to the PDA.  During  attempted LV-gram, the patient did develop greater than 10 seconds of  asystole requiring transcutaneous pacing.  He did have a temporary  transvenous pacer wire inserted by Dr. Clarene Duke.  He is now referred for  pacemaker placement secondary to intermittent second and third-degree  heart block with known CAD.   DESCRIPTION OF PROCEDURE:  After the patient gave informed written  consent, the patient was brought to the cardiac cath lab, where the left  chest was shaved, prepped and draped in sterile fashion.  ECG monitoring  was established.  Lidocaine 1% was used to anesthetize the left  midsubclavicular area.  Next, approximately a 3-cm incision was then  carried out beneath the left clavicle,  and hemostasis was obtained with  electrocautery.  Blunt dissection was then used to carry this down to  the left deltopectoral fascia.  Next, approximately a 3 x 4-cm pocket  was then created over the left deltopectoral fascia, and, again,  hemostasis was obtained with electrocautery.  The left subclavian vein  was then using fluoro and contrast visualization, and a guide wire was  then easily passed into the SVC.  A second stick was then performed with  easy passage of a second retained guide wire.  Four silk sutures were  then placed at the bases of the retained guide wires to anchor the leads  to the pectoral fascia.  Over the first retained guide wire, a #7-French  dilator and sheath were then inserted, and the dilator and guide wire  were removed.  Through this, a 58-cm active Medtronic lead, model #5076,  serial [id number], was then easily passed into the right atrium and the  peel-away sheath was removed.  Over the second retained guide wire, a  second 7-French dilator and sheath  were then inserted over the guide  wire, and the guide wire and dilator were removed.  Through this, a 52-  cm active Medtronic lead, model #5076, serial [id number], was then  inserted into the right atrium and the peel-away sheath was removed.  A  J curve was then placed on the ventricular lead stylet, and this was  allowed to prolapse through the tricuspid valve and was positioned in  the RV apex without difficulty.  We were able to capture at 2 V.  The  screw was then extended and thresholds were then determined.  R waves  were measured at 14.1 mV.  Thresholds were 0.7 V at 0.5 msec.  Impedance  was 776 ohms, and 10 V was negative for diaphragmatic stimulation.  Current was 1.1 mA.  The preformed atrial stylet was then inserted into  the atrial lead, and this was positioned in the area of the right atrial  appendage.  Once we were able to capture at 2 V, this screw was then  extended.  Thresholds were  then determined.  P waves were measured at  2.7 V.  Threshold in the atrium was 0.6 V at 0.5 msec.  The impedance  was 685 ohms.  Current was 1.3 mA.  Again, 10 V was negative for  diaphragmatic stimulation.  The leads were then secured to the pectoral  fascia with 2 silk sutures per lead.  The pocket was then copiously  irrigated with 1% kanamycin solution, and, again, hemostasis was  confirmed.  The leads were then connected in a serial fashion to a  Medtronic Adapta ADDR01 generator, serial [id number].  Header screws  were tightened and pacing was confirmed.  A single silk suture was then  placed in the apex of the pocket.  The generator and leads were then  delivered into the pocket, and the header was secured with the silk  suture.  The subcutaneous layer was then closed using running 2-0  Vicryl.  The skin was then closed using running 4-0 Vicryl.  Steri-  Strips were applied.  The patient was transferred to the recovery room  in stable condition.   CONCLUSION:  Successful implant of a Medtronic Adapta generator, model  ADDR01, serial # [id number], with active atrial and ventricular leads.      Darlin Priestly, MD  Electronically Signed     RHM/MEDQ  D:  12/15/2006  T:  12/17/2006  Job:  [id number]   cc:   Erskine Speed, M.D.  Nanetta Batty, M.D.  Thereasa Solo. Little, M.D.

## 2011-04-30 ENCOUNTER — Emergency Department (HOSPITAL_COMMUNITY)
Admission: EM | Admit: 2011-04-30 | Discharge: 2011-04-30 | Disposition: A | Discharge status: Home / Self Care | Payer: Medicare Other | Attending: Emergency Medicine | Admitting: Emergency Medicine

## 2011-04-30 DIAGNOSIS — Z7982 Long term (current) use of aspirin: Secondary | ICD-10-CM | POA: Insufficient documentation

## 2011-04-30 DIAGNOSIS — R079 Chest pain, unspecified: Secondary | ICD-10-CM | POA: Insufficient documentation

## 2011-04-30 DIAGNOSIS — I2510 Atherosclerotic heart disease of native coronary artery without angina pectoris: Secondary | ICD-10-CM | POA: Insufficient documentation

## 2011-04-30 DIAGNOSIS — Z79899 Other long term (current) drug therapy: Secondary | ICD-10-CM | POA: Insufficient documentation

## 2011-04-30 DIAGNOSIS — I10 Essential (primary) hypertension: Secondary | ICD-10-CM | POA: Insufficient documentation

## 2011-04-30 DIAGNOSIS — Z950 Presence of cardiac pacemaker: Secondary | ICD-10-CM | POA: Insufficient documentation

## 2011-04-30 DIAGNOSIS — M79609 Pain in unspecified limb: Secondary | ICD-10-CM | POA: Insufficient documentation

## 2011-04-30 LAB — BASIC METABOLIC PANEL
CO2: 30 mEq/L (ref 19–32)
GFR calc non Af Amer: >60 mL/min (ref >60)
Glucose, Bld: 115 mg/dL — ABNORMAL HIGH (ref 70–99)
Potassium: 4.5 mEq/L (ref 3.5–5.1)
Sodium: 135 mEq/L (ref 135–145)

## 2011-04-30 LAB — DIFFERENTIAL
Basophils Relative: 0 % (ref 0–1)
Eosinophils Absolute: 0.2 10*3/uL (ref 0.0–0.7)
Monocytes Absolute: 1 10*3/uL (ref 0.1–1.0)
Monocytes Relative: 13 % — ABNORMAL HIGH (ref 3–12)
Neutrophils Relative %: 61 % (ref 43–77)

## 2011-04-30 LAB — CBC
MCH: 36.1 pg — ABNORMAL HIGH (ref 26.0–34.0)
MCHC: 36.4 g/dL — ABNORMAL HIGH (ref 30.0–36.0)
Platelets: 185 10*3/uL (ref 150–400)
RDW: 12.6 % (ref 11.5–15.5)

## 2011-04-30 LAB — PROTIME-INR: Prothrombin Time: 29.4 seconds — ABNORMAL HIGH (ref 11.6–15.2)

## 2011-07-01 LAB — PROTIME-INR: INR: 1.9 — ABNORMAL HIGH

## 2011-07-03 LAB — BASIC METABOLIC PANEL
BUN: 12
CO2: 25
Calcium: 8.6
Creatinine, Ser: 0.97
Glucose, Bld: 99

## 2011-07-03 LAB — URINALYSIS, ROUTINE W REFLEX MICROSCOPIC
Bilirubin Urine: NEGATIVE
Ketones, ur: NEGATIVE
Nitrite: NEGATIVE
Protein, ur: NEGATIVE
Specific Gravity, Urine: 1.017
pH: 6

## 2011-07-03 LAB — DIFFERENTIAL
Basophils Relative: 0
Eosinophils Absolute: 0.2
Neutro Abs: 4.7
Neutrophils Relative %: 67

## 2011-07-03 LAB — CBC
Hemoglobin: 15.2
MCHC: 34.5
MCHC: 34.5
MCHC: 34.7
MCHC: 34.8
MCV: 101.2 — ABNORMAL HIGH
MCV: 101.7 — ABNORMAL HIGH
MCV: 102.6 — ABNORMAL HIGH
Platelets: 183
Platelets: 197
RDW: 12.8
RDW: 13.1
WBC: 7
WBC: 8
WBC: 8.2

## 2011-07-03 LAB — PROTIME-INR
INR: 1.9 — ABNORMAL HIGH
Prothrombin Time: 21.2 — ABNORMAL HIGH
Prothrombin Time: 22.6 — ABNORMAL HIGH

## 2011-07-03 LAB — COMPREHENSIVE METABOLIC PANEL
ALT: 25
Alkaline Phosphatase: 69
BUN: 17
CO2: 27
Calcium: 9.1
GFR calc non Af Amer: >60
Glucose, Bld: 139 — ABNORMAL HIGH
Potassium: 4.7
Sodium: 134 — ABNORMAL LOW

## 2011-07-03 LAB — URINE MICROSCOPIC-ADD ON

## 2011-07-03 LAB — HEPARIN LEVEL (UNFRACTIONATED)
Heparin Unfractionated: 0.75 — ABNORMAL HIGH
Heparin Unfractionated: <0.1 — ABNORMAL LOW

## 2011-07-03 LAB — CARDIAC PANEL(CRET KIN+CKTOT+MB+TROPI)
CK, MB: 1.6
Total CK: 112

## 2011-07-03 LAB — CK TOTAL AND CKMB (NOT AT ARMC): Total CK: 153

## 2011-07-03 LAB — TSH: TSH: 3.379

## 2012-01-15 ENCOUNTER — Other Ambulatory Visit (HOSPITAL_COMMUNITY): Payer: Self-pay | Admitting: Orthopedic Surgery

## 2012-01-15 DIAGNOSIS — M25552 Pain in left hip: Secondary | ICD-10-CM

## 2012-01-20 ENCOUNTER — Encounter (HOSPITAL_COMMUNITY)
Admission: RE | Admit: 2012-01-20 | Discharge: 2012-01-20 | Disposition: A | Discharge status: Home / Self Care | Payer: Medicare Other | Source: Ambulatory Visit | Attending: Orthopedic Surgery | Admitting: Orthopedic Surgery

## 2012-01-20 DIAGNOSIS — M25552 Pain in left hip: Secondary | ICD-10-CM

## 2012-01-20 DIAGNOSIS — Z96649 Presence of unspecified artificial hip joint: Secondary | ICD-10-CM | POA: Insufficient documentation

## 2012-01-20 DIAGNOSIS — M25559 Pain in unspecified hip: Secondary | ICD-10-CM | POA: Insufficient documentation

## 2012-01-20 MED ORDER — TECHNETIUM TC 99M MEDRONATE IV KIT
25.0000 | PACK | Freq: Once | INTRAVENOUS | Status: AC | PRN
  Administered 2012-01-20: 24.7 via INTRAVENOUS

## 2012-02-12 HISTORY — PX: NM MYOCAR PERF WALL MOTION: HXRAD629

## 2012-09-03 ENCOUNTER — Encounter (HOSPITAL_COMMUNITY): Payer: Self-pay | Admitting: *Deleted

## 2012-09-03 ENCOUNTER — Emergency Department (HOSPITAL_COMMUNITY)
Admission: EM | Admit: 2012-09-03 | Discharge: 2012-09-04 | Disposition: A | Discharge status: Home / Self Care | Payer: Medicare Other | Attending: Emergency Medicine | Admitting: Emergency Medicine

## 2012-09-03 ENCOUNTER — Emergency Department (INDEPENDENT_AMBULATORY_CARE_PROVIDER_SITE_OTHER)
Admission: EM | Admit: 2012-09-03 | Discharge: 2012-09-03 | Disposition: A | Discharge status: Home / Self Care | Payer: Medicare Other | Source: Home / Self Care | Attending: Family Medicine | Admitting: Family Medicine

## 2012-09-03 ENCOUNTER — Encounter (HOSPITAL_COMMUNITY): Payer: Self-pay | Admitting: Emergency Medicine

## 2012-09-03 ENCOUNTER — Emergency Department (HOSPITAL_COMMUNITY)
Admit: 2012-09-03 | Discharge: 2012-09-03 | Disposition: A | Discharge status: Home / Self Care | Payer: Medicare Other | Attending: Family Medicine | Admitting: Family Medicine

## 2012-09-03 DIAGNOSIS — R6883 Chills (without fever): Secondary | ICD-10-CM | POA: Insufficient documentation

## 2012-09-03 DIAGNOSIS — I509 Heart failure, unspecified: Secondary | ICD-10-CM | POA: Insufficient documentation

## 2012-09-03 DIAGNOSIS — Z79899 Other long term (current) drug therapy: Secondary | ICD-10-CM | POA: Insufficient documentation

## 2012-09-03 DIAGNOSIS — J40 Bronchitis, not specified as acute or chronic: Secondary | ICD-10-CM

## 2012-09-03 DIAGNOSIS — J209 Acute bronchitis, unspecified: Secondary | ICD-10-CM | POA: Insufficient documentation

## 2012-09-03 DIAGNOSIS — R062 Wheezing: Secondary | ICD-10-CM | POA: Insufficient documentation

## 2012-09-03 DIAGNOSIS — R0602 Shortness of breath: Secondary | ICD-10-CM | POA: Insufficient documentation

## 2012-09-03 DIAGNOSIS — E079 Disorder of thyroid, unspecified: Secondary | ICD-10-CM | POA: Insufficient documentation

## 2012-09-03 DIAGNOSIS — M7989 Other specified soft tissue disorders: Secondary | ICD-10-CM | POA: Insufficient documentation

## 2012-09-03 DIAGNOSIS — I10 Essential (primary) hypertension: Secondary | ICD-10-CM | POA: Insufficient documentation

## 2012-09-03 DIAGNOSIS — Z87891 Personal history of nicotine dependence: Secondary | ICD-10-CM | POA: Insufficient documentation

## 2012-09-03 DIAGNOSIS — Z7901 Long term (current) use of anticoagulants: Secondary | ICD-10-CM | POA: Insufficient documentation

## 2012-09-03 HISTORY — DX: Essential (primary) hypertension: I10

## 2012-09-03 HISTORY — DX: Disorder of thyroid, unspecified: E07.9

## 2012-09-03 LAB — COMPREHENSIVE METABOLIC PANEL
CO2: 28 mEq/L (ref 19–32)
Calcium: 9.7 mg/dL (ref 8.4–10.5)
Creatinine, Ser: 0.66 mg/dL (ref 0.50–1.35)
GFR calc Af Amer: >90 mL/min (ref >90)
GFR calc non Af Amer: >90 mL/min (ref >90)
Glucose, Bld: 147 mg/dL — ABNORMAL HIGH (ref 70–99)
Total Protein: 7.4 g/dL (ref 6.0–8.3)

## 2012-09-03 LAB — CBC WITH DIFFERENTIAL/PLATELET
Eosinophils Absolute: 0 10*3/uL (ref 0.0–0.7)
Eosinophils Relative: 0 % (ref 0–5)
HCT: 42.6 % (ref 39.0–52.0)
Lymphocytes Relative: 6 % — ABNORMAL LOW (ref 12–46)
Lymphs Abs: 0.5 10*3/uL — ABNORMAL LOW (ref 0.7–4.0)
MCH: 36.1 pg — ABNORMAL HIGH (ref 26.0–34.0)
MCV: 102.4 fL — ABNORMAL HIGH (ref 78.0–100.0)
Monocytes Absolute: 0.1 10*3/uL (ref 0.1–1.0)
Platelets: 198 10*3/uL (ref 150–400)
RBC: 4.16 MIL/uL — ABNORMAL LOW (ref 4.22–5.81)
RDW: 12.3 % (ref 11.5–15.5)
WBC: 7.9 10*3/uL (ref 4.0–10.5)

## 2012-09-03 LAB — URINALYSIS, ROUTINE W REFLEX MICROSCOPIC
Leukocytes, UA: NEGATIVE
Nitrite: NEGATIVE
Protein, ur: 30 mg/dL — AB
Specific Gravity, Urine: 1.027 (ref 1.005–1.030)
Urobilinogen, UA: 1 mg/dL (ref 0.0–1.0)

## 2012-09-03 LAB — PRO B NATRIURETIC PEPTIDE: Pro B Natriuretic peptide (BNP): 923.7 pg/mL — ABNORMAL HIGH (ref 0–450)

## 2012-09-03 LAB — POCT I-STAT TROPONIN I: Troponin i, poc: 0.02 ng/mL (ref 0.00–0.08)

## 2012-09-03 MED ORDER — FLUTICASONE PROPIONATE HFA 110 MCG/ACT IN AERO
1.0000 | INHALATION_SPRAY | Freq: Two times a day (BID) | RESPIRATORY_TRACT | Status: DC
Start: 2012-09-03 — End: 2013-01-11

## 2012-09-03 MED ORDER — AZITHROMYCIN 250 MG PO TABS: ORAL_TABLET | ORAL | Status: DC

## 2012-09-03 MED ORDER — IPRATROPIUM BROMIDE 0.02 % IN SOLN
0.2500 mg | Freq: Once | RESPIRATORY_TRACT | Status: AC
  Administered 2012-09-03: 0.26 mg via RESPIRATORY_TRACT

## 2012-09-03 MED ORDER — METHYLPREDNISOLONE SODIUM SUCC 125 MG IJ SOLR
INTRAMUSCULAR | Status: AC
  Filled 2012-09-03: qty 2

## 2012-09-03 MED ORDER — BENZONATATE 100 MG PO CAPS: 100.0000 mg | ORAL_CAPSULE | Freq: Three times a day (TID) | ORAL | Status: DC

## 2012-09-03 MED ORDER — ALBUTEROL SULFATE (5 MG/ML) 0.5% IN NEBU
INHALATION_SOLUTION | RESPIRATORY_TRACT | Status: AC
  Filled 2012-09-03: qty 0.5

## 2012-09-03 MED ORDER — METHYLPREDNISOLONE SODIUM SUCC 125 MG IJ SOLR
125.0000 mg | Freq: Once | INTRAMUSCULAR | Status: AC
  Administered 2012-09-03: 125 mg via INTRAMUSCULAR

## 2012-09-03 MED ORDER — ALBUTEROL SULFATE (5 MG/ML) 0.5% IN NEBU
2.5000 mg | INHALATION_SOLUTION | Freq: Once | RESPIRATORY_TRACT | Status: AC
  Administered 2012-09-03: 2.5 mg via RESPIRATORY_TRACT

## 2012-09-03 NOTE — ED Provider Notes (Addendum)
History     CSN: [id number]  Arrival date & time 09/03/12  1103   First MD Initiated Contact with Patient 09/03/12 1142      Chief Complaint  Patient presents with  . Cough    (Consider location/radiation/quality/duration/timing/severity/associated sxs/prior treatment) HPI Comments: 77 year old male with history of hypothyroidism, coronary artery disease status post CABG, and atrial fibrillation on chronic Coumadin anticoagulation also patient has a pacer. Former smoker. Here complaining of persistent productive cough of green sputum for one week. Symptoms associated with wheezing and worsening of the cough at nighttime. Denies current chest pain or shortness of breath. Patient reports feeling tired from coughing and also having some discomfort in his upper abdomen below the bilateral ribs only when coughing. Denies pleuritic chest pain with deep inspiration. No headache, fever or chills. He called his primary care provider about his symptoms on November 25 and had a prescription called in to his pharmacy for Augmentin he started to take on same day until today. Also taking a codeine-based cough elixir at nighttime he started 2 days ago with very mild relief of the cough at night. Denies vomiting or diarrhea. Appetite has been decreased although is getting better.    Past Medical History  Diagnosis Date  . Hypertension   . Thyroid disease     No past surgical history on file.  No family history on file.  History  Substance Use Topics  . Smoking status: Not on file  . Smokeless tobacco: Not on file  . Alcohol Use:       Review of Systems  HENT: Negative for ear pain, congestion and sore throat.   Eyes: Negative for visual disturbance.  Respiratory: Positive for cough, shortness of breath and wheezing. Negative for chest tightness.   Cardiovascular: Negative for chest pain and leg swelling.  Gastrointestinal: Negative for nausea, vomiting, abdominal pain and diarrhea.  Skin:  Negative for rash.  Neurological: Negative for headaches.    Allergies  Review of patient's allergies indicates no known allergies.  Home Medications   Current Outpatient Rx  Name  Route  Sig  Dispense  Refill  . ASPIRIN 81 MG PO TABS   Oral   Take 81 mg by mouth daily.         [name] Kitchen KLONOPIN PO   Oral   Take by mouth.         [name] Kitchen LEVOTHYROXINE SODIUM PO   Oral   Take by mouth.         . METOPROLOL TARTRATE PO   Oral   Take by mouth 2 (two) times daily.         . MULTIVITAMIN PO   Oral   Take by mouth.         [name] Kitchen PROTONIX PO   Oral   Take by mouth.         . DIOVAN PO   Oral   Take by mouth.         . WARFARIN SODIUM PO   Oral   Take by mouth.         . AZITHROMYCIN 250 MG PO TABS      Take 2 tablets by mouth on day #1 then 1 tablet by mouth daily next following 4 days.   6 tablet   0   . BENZONATATE 100 MG PO CAPS   Oral   Take 1 capsule (100 mg total) by mouth every 8 (eight) hours.   21 capsule   0   .  FLUTICASONE PROPIONATE  HFA 110 MCG/ACT IN AERO   Inhalation   Inhale 1 puff into the lungs 2 (two) times daily.   1 Inhaler   0     Use as instructed for one week.     BP 142/87  Pulse 106  Temp 98.8 F (37.1 C) (Oral)  Resp 22  SpO2 97%  Physical Exam  Nursing note and vitals reviewed. Constitutional: He is oriented to person, place, and time. He appears well-developed and well-nourished. No distress.  HENT:  Head: Normocephalic and atraumatic.  Right Ear: External ear normal.  Left Ear: External ear normal.  Nose: Nose normal.  Mouth/Throat: Oropharynx is clear and moist. No oropharyngeal exudate.  Eyes: Conjunctivae normal are normal. Right eye exhibits no discharge. Left eye exhibits no discharge.  Neck: Neck supple. No JVD present.  Cardiovascular: Regular rhythm and intact distal pulses.  Exam reveals no gallop and no friction rub.        Mild tachycardia 105  Pulmonary/Chest: He has no rales. He exhibits no  tenderness.       Prolonged expiration with expiratory wheezing on initial exam. No tachypnea. No orthopnea. No pleuritic chest pain with inspiration. No crackles or rales. No rhonchi. Yellow green thick sputum after coughing during exam  Abdominal: Soft. He exhibits no distension. There is no tenderness.       No ascites  Lymphadenopathy:    He has no cervical adenopathy.  Neurological: He is alert and oriented to person, place, and time.  Skin: No rash noted. He is not diaphoretic.    ED Course  Procedures (including critical care time)  Labs Reviewed - No data to display Dg Chest 2 View  09/03/2012  *RADIOLOGY REPORT*  Clinical Data: Productive cough  CHEST - 2 VIEW  Comparison: 07/24/2010  Findings: Cardiomegaly again noted.  Dual lead cardiac pacemaker is unchanged in position.  No focal infiltrate or pulmonary edema. Bilateral perihilar and infrahilar increased bronchial markings suspicious for bronchitic changes.  Stable mild degenerative changes thoracic spine. Status post CABG.  IMPRESSION: No acute infiltrate or pulmonary edema.  Bilateral perihilar and infrahilar increased bronchial markings suspicious for bronchitic changes.  Status post CABG.   Original Report Authenticated By: Natasha Mead, M.D.      1. Bronchitis     EKG: With a ventricular rate of 106 beats per minutes is paced shows mostly wide negative QRS complexes. No obvious ST changes. Concerning as EKG is different from prior EKG last year where patient had up right wide complexes in most anterior leads. Decided to transfer to the emergency department for further evaluation and management. Difficult for me to interpret this EKG. Talked to Dr. Herbie Baltimore from [facility] cardiology I will fax EKG for his review he will likely follow in the ED.    MDM  77 year old male with history of hypothyroidism, coronary artery disease status post CABG, A. fib on chronic anticoagulation also has a pacemaker. Here complaining of  persistent and productive cough with yellow sputum associated with wheezing worse symptoms overnight for over one week. No chest pain. Patient is on Augmentin days 5/7. On exam: Patient was slightly tachycardic with heart rate 105-108, O2 sat 95-97% respiration 22. Initialy exam patient had prolonged expiration and expiratory wheezing, no crackles or rails no orthopnea. X-ray consistent op bronchitic changes. Patient had administered Solu-Medrol 125 mg IM x1 and was given nebulization of albuterol 2.5 mg and Atrovent 2.5 mg x1 lungs were clear after treatment. My plan was to  discharge patient on added Zithromax and Flovent and complete augmentin. Patient with persistent cough despite clear lungs and treatment. Given changes in his EKG and bronchitic picture decided to transfer patient to the emergency department for further evaluation and management.         Sharin Grave, MD 09/03/12 1503  Note: Patient with no SOB or chest pain at the moment. States he needs to go home and check on his dog. He signed against medical advice I stressed to him the need to follow up with his cardiologist and primary care provider early next week if not coming back to the ED today. And call 911 or be taken to the ED if any new or worsening symptoms despite following treatment.  Sharin Grave, MD 09/03/12 1530

## 2012-09-03 NOTE — ED Notes (Signed)
i triaged this man earlier no distress.  The pt has not had any change.  Alert no distress not coughing

## 2012-09-03 NOTE — ED Notes (Signed)
The pt has had issues with cough cold since last Friday when he was visiting out-of town.  Productive cough.  [facility] was [facility] today and his wife was brought here for the same symptoms.  He is on an antibiotic.

## 2012-09-03 NOTE — ED Notes (Signed)
Triaged by ramone, cma 

## 2012-09-03 NOTE — ED Provider Notes (Signed)
History     CSN: [id number]  Arrival date & time 09/03/12  1711   First MD Initiated Contact with Patient 09/03/12 2043      Chief Complaint  Patient presents with  . 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 This started about a week ago. He had been prescribed Augmentin by his PCP, as well as cough medicine, which was ineffective.  He was seen at the urgent care Center earlier today and was felt to have a bronchitis, blood also noted to have an abnormal EKG. He was advised to come to the ED to be checked. However he had to check on his dog and so went home. Now he returns, continuing to have cough. He is a man who has a history of coronary artery disease, and has had quadruple bypass a number of years ago. Since then he's had some ankle edema. He is also on levothyroxine for hypothyroidism.   Patient is a 77 y.o. male presenting with cough. The history is provided by the patient. No language interpreter was used.  Cough This is a new problem. The current episode started more than 1 week ago. The problem occurs hourly. The problem has been gradually worsening. The cough is non-productive. There has been no fever. Associated symptoms include chills. Pertinent negatives include no chest pain and no shortness of breath. Treatments tried: Augmentin, cough medicine, without improvement.    Past Medical History  Diagnosis Date  . Hypertension   . Thyroid disease     History reviewed. No pertinent past surgical history.  No family history on file.  History  Substance Use Topics  . Smoking status: Former Games developer  . Smokeless tobacco: Not on file  . Alcohol Use: Yes      Review of Systems  Constitutional: Positive for chills. Negative for fever.  HENT: Negative.   Eyes: Negative.   Respiratory:  Positive for cough. Negative for shortness of breath.   Cardiovascular: Positive for leg swelling. Negative for chest pain.  Gastrointestinal: Negative.   Genitourinary: Negative.   Musculoskeletal: Negative.   Skin: Negative.   Neurological: Negative.   Psychiatric/Behavioral: Negative.     Allergies  Review of patient's allergies indicates no known allergies.  Home Medications   Current Outpatient Rx  Name  Route  Sig  Dispense  Refill  . AMOXICILLIN-POT CLAVULANATE 875-125 MG PO TABS   Oral   Take 1 tablet by mouth 2 (two) times daily.         . ASPIRIN 81 MG PO TABS   Oral   Take 81 mg by mouth daily.         . ATORVASTATIN CALCIUM 40 MG PO TABS   Oral   Take 40 mg by mouth daily.         [name] Kitchen CLONAZEPAM 0.5 MG PO TABS   Oral   Take 0.25-0.5 mg by mouth 3 (three) times daily as needed. Takes 1/2 tablet twice daily then a whole tablet at bedtime         . DOXAZOSIN MESYLATE 2 MG PO TABS   Oral   Take 1 mg by mouth at bedtime.         [name] Kitchen EZETIMIBE 10 MG PO TABS   Oral   Take 10 mg by mouth daily.         [name] Kitchen FERROUS SULFATE 325 (65 FE) MG PO TABS  Oral   Take 325 mg by mouth every other day. On Monday, Wednesday and Friday         . FLUTICASONE PROPIONATE  HFA 110 MCG/ACT IN AERO   Inhalation   Inhale 1 puff into the lungs 2 (two) times daily.   1 Inhaler   0     Use as instructed for one week.   [name] Kitchen HYDROCODONE-HOMATROPINE 5-1.5 MG/5ML PO SYRP   Oral   Take 5 mLs by mouth at bedtime.         [name] Kitchen LEVOTHYROXINE SODIUM 75 MCG PO TABS   Oral   Take 75 mcg by mouth daily.         [name] Kitchen METOPROLOL TARTRATE 50 MG PO TABS   Oral   Take 50 mg by mouth 2 (two) times daily.         . ADULT MULTIVITAMIN W/MINERALS CH   Oral   Take 1 tablet by mouth daily.         [name] Kitchen OMEPRAZOLE 20 MG PO CPDR   Oral   Take 20 mg by mouth 2 (two) times daily.         [name] Kitchen VALSARTAN 160 MG PO TABS   Oral   Take 160 mg by mouth daily.         . WARFARIN SODIUM 5  MG PO TABS   Oral   Take 5-7.5 mg by mouth daily. Patient takes 7.5 mg on Tuesday, Wednesday, Thursday. Then takes 5 mg on Monday and Friday. Then takes 2.5 mg on Saturday         . AZITHROMYCIN 250 MG PO TABS      Take 2 tablets by mouth on day #1 then 1 tablet by mouth daily next following 4 days.   6 tablet   0   . BENZONATATE 100 MG PO CAPS   Oral   Take 1 capsule (100 mg total) by mouth every 8 (eight) hours.   21 capsule   0     BP 159/77  Pulse 105  Temp 98.1 F (36.7 C) (Oral)  Resp 13  Ht 6' (1.829 m)  Wt 245 lb (111.131 kg)  BMI 33.23 kg/m2  SpO2 95%  Physical Exam  Nursing note and vitals reviewed. Constitutional: He appears well-developed and well-nourished. No distress.       Tachycardia of 105 on monitor when I saw pt.  HENT:  Head: Normocephalic and atraumatic.  Right Ear: External ear normal.  Left Ear: External ear normal.  Mouth/Throat: Oropharynx is clear and moist.  Eyes: Conjunctivae normal and EOM are normal. Pupils are equal, round, and reactive to light.  Neck: Normal range of motion. Neck supple.  Cardiovascular: Normal rate, regular rhythm and normal heart sounds.   Pulmonary/Chest: Effort normal and breath sounds normal.       Pacemaker on left upper anterior chest.  Abdominal: Soft. Bowel sounds are normal.  Musculoskeletal:       2+ bilateral ankle and lower leg edema.  Skin: Skin is warm and dry.  Psychiatric: He has a normal mood and affect. His behavior is normal.    ED Course  Procedures (including critical care time)   Labs Reviewed  CBC WITH DIFFERENTIAL  COMPREHENSIVE METABOLIC PANEL  URINALYSIS, ROUTINE W REFLEX MICROSCOPIC  PRO B NATRIURETIC PEPTIDE  URINALYSIS, ROUTINE W REFLEX MICROSCOPIC   Dg Chest 2 View  09/03/2012  *RADIOLOGY REPORT*  Clinical Data: Productive cough  CHEST - 2 VIEW  Comparison: 07/24/2010  Findings:  Cardiomegaly again noted.  Dual lead cardiac pacemaker is unchanged in position.  No focal  infiltrate or pulmonary edema. Bilateral perihilar and infrahilar increased bronchial markings suspicious for bronchitic changes.  Stable mild degenerative changes thoracic spine. Status post CABG.  IMPRESSION: No acute infiltrate or pulmonary edema.  Bilateral perihilar and infrahilar increased bronchial markings suspicious for bronchitic changes.  Status post CABG.   Original Report Authenticated By: Natasha Mead, M.D.    Pt seen --> physical exam performed.  Lab workup including Pro BNP ordered.  Chest x-ray earlier today suggests CHF to me.   Date: 09/03/2012  Rate:108  Rhythm: Electronically paced rhythm.  QRS Axis: left  Intervals: QT prolonged  ST/T Wave abnormalities: nonspecific T wave changes  Conduction Disutrbances:nonspecific intraventricular conduction delay  Narrative Interpretation: Abnormal EKG  Old EKG Reviewed: changes noted--prior EKG's from7/25/2012 and 07/22/2010 show paced rhythm.  10:44 PM Results for orders placed during the hospital encounter of 09/03/12  CBC WITH DIFFERENTIAL      Component Value Range   WBC 7.9  4.0 - 10.5 K/uL   RBC 4.16 (*) 4.22 - 5.81 MIL/uL   Hemoglobin 15.0  13.0 - 17.0 g/dL   HCT 84.6  96.2 - 95.2 %   MCV 102.4 (*) 78.0 - 100.0 fL   MCH 36.1 (*) 26.0 - 34.0 pg   MCHC 35.2  30.0 - 36.0 g/dL   RDW 84.1  32.4 - 40.1 %   Platelets 198  150 - 400 K/uL   Neutrophils Relative 94 (*) 43 - 77 %   Neutro Abs 7.4  1.7 - 7.7 K/uL   Lymphocytes Relative 6 (*) 12 - 46 %   Lymphs Abs 0.5 (*) 0.7 - 4.0 K/uL   Monocytes Relative 1 (*) 3 - 12 %   Monocytes Absolute 0.1  0.1 - 1.0 K/uL   Eosinophils Relative 0  0 - 5 %   Eosinophils Absolute 0.0  0.0 - 0.7 K/uL   Basophils Relative 0  0 - 1 %   Basophils Absolute 0.0  0.0 - 0.1 K/uL  COMPREHENSIVE METABOLIC PANEL      Component Value Range   Sodium 133 (*) 135 - 145 mEq/L   Potassium 4.7  3.5 - 5.1 mEq/L   Chloride 93 (*) 96 - 112 mEq/L   CO2 28  19 - 32 mEq/L   Glucose, Bld 147 (*) 70 - 99  mg/dL   BUN 15  6 - 23 mg/dL   Creatinine, Ser 0.27  0.50 - 1.35 mg/dL   Calcium 9.7  8.4 - 25.3 mg/dL   Total Protein 7.4  6.0 - 8.3 g/dL   Albumin 3.9  3.5 - 5.2 g/dL   AST 41 (*) 0 - 37 U/L   ALT 24  0 - 53 U/L   Alkaline Phosphatase 78  39 - 117 U/L   Total Bilirubin 1.3 (*) 0.3 - 1.2 mg/dL   GFR calc non Af Amer >90  >90 mL/min   GFR calc Af Amer >90  >90 mL/min  PRO B NATRIURETIC PEPTIDE      Component Value Range   Pro B Natriuretic peptide (BNP) 923.7 (*) 0 - 450 pg/mL  URINALYSIS, ROUTINE W REFLEX MICROSCOPIC      Component Value Range   Color, Urine YELLOW  YELLOW   APPearance CLEAR  CLEAR   Specific Gravity, Urine 1.027  1.005 - 1.030   pH 5.5  5.0 - 8.0   Glucose, UA NEGATIVE  NEGATIVE mg/dL  Hgb urine dipstick MODERATE (*) NEGATIVE   Bilirubin Urine NEGATIVE  NEGATIVE   Ketones, ur 15 (*) NEGATIVE mg/dL   Protein, ur 30 (*) NEGATIVE mg/dL   Urobilinogen, UA 1.0  0.0 - 1.0 mg/dL   Nitrite NEGATIVE  NEGATIVE   Leukocytes, UA NEGATIVE  NEGATIVE  POCT I-STAT TROPONIN I      Component Value Range   Troponin i, poc 0.02  0.00 - 0.08 ng/mL   Comment 3           URINE MICROSCOPIC-ADD ON      Component Value Range   Squamous Epithelial / LPF RARE  RARE   RBC / HPF 3-6  <3 RBC/hpf   Dg Chest 2 View  09/03/2012  *RADIOLOGY REPORT*  Clinical Data: Productive cough  CHEST - 2 VIEW  Comparison: 07/24/2010  Findings: Cardiomegaly again noted.  Dual lead cardiac pacemaker is unchanged in position.  No focal infiltrate or pulmonary edema. Bilateral perihilar and infrahilar increased bronchial markings suspicious for bronchitic changes.  Stable mild degenerative changes thoracic spine. Status post CABG.  IMPRESSION: No acute infiltrate or pulmonary edema.  Bilateral perihilar and infrahilar increased bronchial markings suspicious for bronchitic changes.  Status post CABG.   Original Report Authenticated By: Natasha Mead, M.D.    Lab workup does not show cause of persistant cough.   Pt had recent trip to [place].  Will order CT angio of chest to check for pulmonary embolism.  Wells score -- 4.5, intermediate risk.  1:09 AM CT angio of chest showed mild CHF, no PE.  Pt to take his Rx of azithromycin, and also start Lasix 40 mg each morning.  Followup with Elmore Guise, M.D., his internist.   1. Acute bronchitis   2. CHF (congestive heart failure)          Carleene Cooper III, MD 09/04/12 [phone]

## 2012-09-03 NOTE — ED Notes (Signed)
Iv team here 

## 2012-09-03 NOTE — ED Notes (Signed)
The pt asking for food.  Unable to do so at present

## 2012-09-03 NOTE — ED Notes (Signed)
The pt needs iv unable to get.  Iv team called

## 2012-09-03 NOTE — ED Notes (Signed)
Reports going out of town and became sick.  PCP was called and gave patient Augmentin on 08/30/12 which did not work and then patient was given hydromet on 09/01/12.  Patient states he is still coughing and would like some to check on him since he has not actually seen anyone.

## 2012-09-04 ENCOUNTER — Emergency Department (HOSPITAL_COMMUNITY): Payer: Medicare Other

## 2012-09-04 MED ORDER — IOHEXOL 350 MG/ML SOLN
100.0000 mL | Freq: Once | INTRAVENOUS | Status: AC | PRN
  Administered 2012-09-04: 100 mL via INTRAVENOUS

## 2012-09-04 MED ORDER — FUROSEMIDE 40 MG PO TABS: 40.0000 mg | ORAL_TABLET | Freq: Every day | ORAL | Status: DC

## 2012-09-04 MED ORDER — AZITHROMYCIN 250 MG PO TABS
500.0000 mg | ORAL_TABLET | Freq: Every day | ORAL | Status: DC
  Administered 2012-09-04: 500 mg via ORAL
  Filled 2012-09-04: qty 2

## 2012-09-04 NOTE — ED Notes (Signed)
Waiting  for ct

## 2012-09-04 NOTE — ED Notes (Signed)
The pt returned from c-t 

## 2012-12-03 ENCOUNTER — Other Ambulatory Visit: Payer: Self-pay | Admitting: Orthopedic Surgery

## 2012-12-03 MED ORDER — DEXAMETHASONE SODIUM PHOSPHATE 10 MG/ML IJ SOLN: 10.0000 mg | Freq: Once | INTRAMUSCULAR | Status: DC

## 2012-12-03 MED ORDER — BUPIVACAINE LIPOSOME 1.3 % IJ SUSP: 20.0000 mL | Freq: Once | INTRAMUSCULAR | Status: DC

## 2012-12-03 NOTE — Progress Notes (Signed)
Preoperative surgical orders have been place into the Epic hospital system for Mike Price on 12/03/2012, 2:30 PM  by Patrica Duel for surgery on 01/17/2013.  Preop Total Knee orders including Experal, IV Tylenol, and IV Decadron as long as there are no contraindications to the above medications. Avel Peace, PA-C

## 2012-12-17 ENCOUNTER — Emergency Department (HOSPITAL_COMMUNITY): Payer: Medicare Other

## 2012-12-17 ENCOUNTER — Encounter (HOSPITAL_COMMUNITY): Payer: Self-pay | Admitting: Emergency Medicine

## 2012-12-17 ENCOUNTER — Emergency Department (HOSPITAL_COMMUNITY)
Admission: EM | Admit: 2012-12-17 | Discharge: 2012-12-17 | Disposition: A | Discharge status: Home / Self Care | Payer: Medicare Other | Attending: Emergency Medicine | Admitting: Emergency Medicine

## 2012-12-17 DIAGNOSIS — M129 Arthropathy, unspecified: Secondary | ICD-10-CM | POA: Insufficient documentation

## 2012-12-17 DIAGNOSIS — Z7901 Long term (current) use of anticoagulants: Secondary | ICD-10-CM | POA: Insufficient documentation

## 2012-12-17 DIAGNOSIS — E079 Disorder of thyroid, unspecified: Secondary | ICD-10-CM | POA: Insufficient documentation

## 2012-12-17 DIAGNOSIS — I10 Essential (primary) hypertension: Secondary | ICD-10-CM | POA: Insufficient documentation

## 2012-12-17 DIAGNOSIS — Z79899 Other long term (current) drug therapy: Secondary | ICD-10-CM | POA: Insufficient documentation

## 2012-12-17 DIAGNOSIS — Z7982 Long term (current) use of aspirin: Secondary | ICD-10-CM | POA: Insufficient documentation

## 2012-12-17 DIAGNOSIS — Y9289 Other specified places as the place of occurrence of the external cause: Secondary | ICD-10-CM | POA: Insufficient documentation

## 2012-12-17 DIAGNOSIS — IMO0002 Reserved for concepts with insufficient information to code with codable children: Secondary | ICD-10-CM | POA: Insufficient documentation

## 2012-12-17 DIAGNOSIS — Y9389 Activity, other specified: Secondary | ICD-10-CM | POA: Insufficient documentation

## 2012-12-17 DIAGNOSIS — W1809XA Striking against other object with subsequent fall, initial encounter: Secondary | ICD-10-CM | POA: Insufficient documentation

## 2012-12-17 DIAGNOSIS — I4891 Unspecified atrial fibrillation: Secondary | ICD-10-CM | POA: Insufficient documentation

## 2012-12-17 DIAGNOSIS — R791 Abnormal coagulation profile: Secondary | ICD-10-CM

## 2012-12-17 DIAGNOSIS — S0990XA Unspecified injury of head, initial encounter: Secondary | ICD-10-CM

## 2012-12-17 DIAGNOSIS — Z87891 Personal history of nicotine dependence: Secondary | ICD-10-CM | POA: Insufficient documentation

## 2012-12-17 LAB — BASIC METABOLIC PANEL
CO2: 31 mEq/L (ref 19–32)
Chloride: 98 mEq/L (ref 96–112)
Creatinine, Ser: 0.87 mg/dL (ref 0.50–1.35)
GFR calc Af Amer: >90 mL/min (ref >90)
Potassium: 4.5 mEq/L (ref 3.5–5.1)

## 2012-12-17 LAB — PROTIME-INR
INR: 3.2 — ABNORMAL HIGH (ref 0.00–1.49)
Prothrombin Time: 31 seconds — ABNORMAL HIGH (ref 11.6–15.2)

## 2012-12-17 LAB — CBC WITH DIFFERENTIAL/PLATELET
Basophils Absolute: 0 10*3/uL (ref 0.0–0.1)
Basophils Relative: 0 % (ref 0–1)
Eosinophils Absolute: 0.2 10*3/uL (ref 0.0–0.7)
Hemoglobin: 13.6 g/dL (ref 13.0–17.0)
MCH: 35 pg — ABNORMAL HIGH (ref 26.0–34.0)
MCHC: 34.1 g/dL (ref 30.0–36.0)
Monocytes Absolute: 0.8 10*3/uL (ref 0.1–1.0)
Monocytes Relative: 11 % (ref 3–12)
Neutro Abs: 4.8 10*3/uL (ref 1.7–7.7)
Neutrophils Relative %: 63 % (ref 43–77)
RDW: 13.4 % (ref 11.5–15.5)

## 2012-12-17 NOTE — ED Notes (Addendum)
Reports while installing a toilet set around 1730hr yesterday afternoon fell off the tolilet sit and landed on his behind. Hit head against the sink. No LOC, no neuro deficits. Called family MD after incident and instructed to be watching out if acting funny call 911 therefore did not call EMS. Patient slepted through the night and denies any problem.  Call family MD this morning (MD not available) spoked to the nurse instructed to go to the ED for a CT scan. Patient drove self to the ED. General appearance is a patient in no acute distress and no neuro deficit.

## 2012-12-17 NOTE — ED Notes (Signed)
Patient transported to CT 

## 2012-12-17 NOTE — ED Provider Notes (Signed)
History     CSN: [id number]  Arrival date & time 12/17/12  1233   First MD Initiated Contact with Patient 12/17/12 1241      No chief complaint on file.   (Consider location/radiation/quality/duration/timing/severity/associated sxs/prior treatment) The history is provided by the patient.  ADIT RIDDLES is a 78 y.o. male history A. fib on Coumadin, arthritis, here presenting with fall. He was trying to fix a toilet and then his knees gave out due to chronic arthritis pain and he hit the back of his head. Did not pass out and denies any chest pain shortness of breath. His INR was 2.9 3 weeks ago. Denies any weakness or numbness but does have some pain in his right shoulder that is chronic. No vomiting or headaches.    Past Medical History  Diagnosis Date  . Hypertension   . Thyroid disease     History reviewed. No pertinent past surgical history.  No family history on file.  History  Substance Use Topics  . Smoking status: Former Games developer  . Smokeless tobacco: Not on file  . Alcohol Use: Yes      Review of Systems  Neurological:       Fall   All other systems reviewed and are negative.    Allergies  Review of patient's allergies indicates no known allergies.  Home Medications   Current Outpatient Rx  Name  Route  Sig  Dispense  Refill  . aspirin 81 MG tablet   Oral   Take 81 mg by mouth daily.         [name] Kitchen atorvastatin (LIPITOR) 40 MG tablet   Oral   Take 40 mg by mouth daily.         . clonazePAM (KLONOPIN) 0.5 MG tablet   Oral   Take 0.25-0.5 mg by mouth 3 (three) times daily as needed. Takes 1/2 tablet twice daily then a whole tablet at bedtime         . doxazosin (CARDURA) 2 MG tablet   Oral   Take 1 mg by mouth at bedtime.         [name] Kitchen ezetimibe (ZETIA) 10 MG tablet   Oral   Take 10 mg by mouth daily.         . ferrous sulfate 325 (65 FE) MG tablet   Oral   Take 325 mg by mouth every other day. On Monday, Wednesday and Friday          . fluticasone (FLOVENT HFA) 110 MCG/ACT inhaler   Inhalation   Inhale 1 puff into the lungs 2 (two) times daily.   1 Inhaler   0     Use as instructed for one week.   . furosemide (LASIX) 40 MG tablet   Oral   Take 20 mg by mouth daily.         [name] Kitchen levothyroxine (SYNTHROID, LEVOTHROID) 75 MCG tablet   Oral   Take 75 mcg by mouth daily.         . metoprolol (LOPRESSOR) 50 MG tablet   Oral   Take 50 mg by mouth 2 (two) times daily.         . Multiple Vitamin (MULTIVITAMIN WITH MINERALS) TABS   Oral   Take 1 tablet by mouth daily.         [name] Kitchen omeprazole (PRILOSEC) 20 MG capsule   Oral   Take 20 mg by mouth 2 (two) times daily.         [name] Kitchen  valsartan (DIOVAN) 160 MG tablet   Oral   Take 160 mg by mouth daily.         [name] Kitchen warfarin (COUMADIN) 5 MG tablet   Oral   Take 5-7.5 mg by mouth daily. Patient takes 7.5 mg 1 and 1/2 tablet every day except on Monday and Friday, pt takes 1 tablet for 5 mg dose.           BP 135/81  Pulse 105  Temp(Src) 97.9 F (36.6 C) (Oral)  Resp 16  Ht 5\' 10"  (1.778 m)  Wt 235 lb (106.595 kg)  BMI 33.72 kg/m2  Physical Exam  Nursing note and vitals reviewed. Constitutional: He is oriented to person, place, and time. He appears well-developed and well-nourished.  HENT:  Head: Normocephalic.  Mouth/Throat: Oropharynx is clear and moist.  + bruise in posterior head, no laceration   Eyes: Conjunctivae are normal. Pupils are equal, round, and reactive to light.  Neck: Normal range of motion. Neck supple.  No midline tenderness, nl ROM   Cardiovascular: Normal rate, regular rhythm and normal heart sounds.   Pulmonary/Chest: Effort normal and breath sounds normal. No respiratory distress. He has no wheezes. He has no rales.  Abdominal: Soft. Bowel sounds are normal. He exhibits no distension. There is no tenderness. There is no rebound.  Musculoskeletal: Normal range of motion. He exhibits no edema and no tenderness.  No signs of  extremity trauma   Neurological: He is alert and oriented to person, place, and time.  Skin: Skin is warm and dry.  Psychiatric: He has a normal mood and affect. His behavior is normal. Judgment and thought content normal.    ED Course  Procedures (including critical care time)  Labs Reviewed  CBC WITH DIFFERENTIAL - Abnormal; Notable for the following:    RBC 3.89 (*)    MCV 102.6 (*)    MCH 35.0 (*)    All other components within normal limits  PROTIME-INR - Abnormal; Notable for the following:    Prothrombin Time 31.0 (*)    INR 3.20 (*)    All other components within normal limits  BASIC METABOLIC PANEL - Abnormal; Notable for the following:    Glucose, Bld 114 (*)    GFR calc non Af Amer 82 (*)    All other components within normal limits   Ct Head Wo Contrast  12/17/2012  *RADIOLOGY REPORT*  Clinical Data: Fall.  Head injury  CT HEAD WITHOUT CONTRAST  Technique:  Contiguous axial images were obtained from the base of the skull through the vertex without contrast.  Comparison: None  Findings: Mild atrophy.  Mild chronic microvascular ischemic change in the white matter.  Negative for acute infarct.  Negative for hemorrhage or mass.  No midline shift.  Negative for skull fracture.  IMPRESSION: Mild chronic microvascular ischemia.  No acute abnormality.   Original Report Authenticated By: Janeece Riggers, M.D.      No diagnosis found.    MDM  TINY RIETZ is a 78 y.o. male here with s/p fall on coumadin. Small posterior bruise. Will get CT head to r/o bleed. Will check INR and basic labs.   2:16 PM CT head nl. INR 3.2. I told him to hold 3 doses and restart Monday or talk to his doctor Monday to get repeat INR. Head injury instructions given.         Richardean Canal, MD 12/17/12 [phone]

## 2012-12-21 ENCOUNTER — Ambulatory Visit: Payer: Self-pay | Admitting: Cardiovascular Disease

## 2012-12-21 DIAGNOSIS — Z7901 Long term (current) use of anticoagulants: Secondary | ICD-10-CM | POA: Insufficient documentation

## 2012-12-21 DIAGNOSIS — I4821 Permanent atrial fibrillation: Secondary | ICD-10-CM | POA: Insufficient documentation

## 2012-12-21 DIAGNOSIS — I4891 Unspecified atrial fibrillation: Secondary | ICD-10-CM

## 2013-01-11 ENCOUNTER — Encounter (HOSPITAL_COMMUNITY)
Admission: RE | Admit: 2013-01-11 | Discharge: 2013-01-11 | Disposition: A | Discharge status: Home / Self Care | Payer: Medicare Other | Source: Ambulatory Visit | Attending: Orthopedic Surgery | Admitting: Orthopedic Surgery

## 2013-01-11 ENCOUNTER — Encounter (HOSPITAL_COMMUNITY): Payer: Self-pay

## 2013-01-11 ENCOUNTER — Encounter (HOSPITAL_COMMUNITY): Payer: Self-pay | Admitting: Pharmacy Technician

## 2013-01-11 DIAGNOSIS — M26629 Arthralgia of temporomandibular joint, unspecified side: Secondary | ICD-10-CM

## 2013-01-11 DIAGNOSIS — R600 Localized edema: Secondary | ICD-10-CM

## 2013-01-11 DIAGNOSIS — I499 Cardiac arrhythmia, unspecified: Secondary | ICD-10-CM

## 2013-01-11 DIAGNOSIS — F419 Anxiety disorder, unspecified: Secondary | ICD-10-CM

## 2013-01-11 HISTORY — DX: Anxiety disorder, unspecified: F41.9

## 2013-01-11 HISTORY — DX: Unspecified osteoarthritis, unspecified site: M19.90

## 2013-01-11 HISTORY — DX: Gastro-esophageal reflux disease without esophagitis: K21.9

## 2013-01-11 HISTORY — DX: Cardiac arrhythmia, unspecified: I49.9

## 2013-01-11 HISTORY — DX: Arthralgia of temporomandibular joint, unspecified side: M26.629

## 2013-01-11 HISTORY — DX: Hypothyroidism, unspecified: E03.9

## 2013-01-11 HISTORY — DX: Localized edema: R60.0

## 2013-01-11 HISTORY — DX: Myoneural disorder, unspecified: G70.9

## 2013-01-11 HISTORY — DX: Anemia, unspecified: D64.9

## 2013-01-11 HISTORY — DX: Presence of cardiac pacemaker: Z95.0

## 2013-01-11 LAB — COMPREHENSIVE METABOLIC PANEL
BUN: 24 mg/dL — ABNORMAL HIGH (ref 6–23)
CO2: 33 mEq/L — ABNORMAL HIGH (ref 19–32)
Calcium: 9.7 mg/dL (ref 8.4–10.5)
Chloride: 103 mEq/L (ref 96–112)
Creatinine, Ser: 0.98 mg/dL (ref 0.50–1.35)
GFR calc Af Amer: >90 mL/min (ref >90)
Potassium: 5 mEq/L (ref 3.5–5.1)
Sodium: 141 mEq/L (ref 135–145)
Total Protein: 7.1 g/dL (ref 6.0–8.3)

## 2013-01-11 LAB — CBC
MCV: 103 fL — ABNORMAL HIGH (ref 78.0–100.0)
Platelets: 189 10*3/uL (ref 150–400)
RBC: 4.03 MIL/uL — ABNORMAL LOW (ref 4.22–5.81)
RDW: 13 % (ref 11.5–15.5)
WBC: 7.5 10*3/uL (ref 4.0–10.5)

## 2013-01-11 LAB — SURGICAL PCR SCREEN: MRSA, PCR: NEGATIVE

## 2013-01-11 LAB — URINALYSIS, ROUTINE W REFLEX MICROSCOPIC
Bilirubin Urine: NEGATIVE
Hgb urine dipstick: NEGATIVE
Protein, ur: NEGATIVE mg/dL
Urobilinogen, UA: 1 mg/dL (ref 0.0–1.0)

## 2013-01-11 LAB — PROTIME-INR: Prothrombin Time: 20.5 seconds — ABNORMAL HIGH (ref 11.6–15.2)

## 2013-01-11 LAB — APTT: aPTT: 39 seconds — ABNORMAL HIGH (ref 24–37)

## 2013-01-11 NOTE — Pre-Procedure Instructions (Addendum)
01-11-13- EKG 3'14, Stress 5'13-report with chart. CT Chest 11'13-Epic. W. Zakia Sainato,RN 01-11-13 1555 Pt/PTT-labs viewable in Epic- will repeat PT/INR on arrival AM of. Faxed to Dr. Deri Fuelling office. W. Kennon Portela

## 2013-01-11 NOTE — Patient Instructions (Addendum)
20 Mike Price  01/11/2013   Your procedure is scheduled on: 4-14  -2014  Report to [facility] at     1200 NOON.  Call this number if you have problems the morning of surgery: [phone]  Or Presurgical Testing [phone](Charlea Nardo)     Do not eat food:After Midnight.  May have clear liquids:up to 6 Hours before arrival. Nothing after : 0900 AM  Clear liquids include soda, tea, black coffee, apple or grape juice, broth.  Take these medicines the morning of surgery with A SIP OF WATER: Clonazepam. Levothyroxine. Metoprolol. Omeprazole. Stop Aspirin and coumadin as directed-use Lovenox as instructed- Last dose 01-16-13 8am.   Do not wear jewelry, make-up or nail polish.  Do not wear lotions, powders, or perfumes. You may wear deodorant.  Do not shave 12 hours prior to first CHG shower(legs and under arms).(face and neck okay.)  Do not bring valuables to the hospital.  Contacts, dentures or bridgework,body piercing,  may not be worn into surgery.  Leave suitcase in the car. After surgery it may be brought to your room.  For patients admitted to the hospital, checkout time is 11:00 AM the day of discharge.   Patients discharged the day of surgery will not be allowed to drive home. Must have responsible person with you x 24 hours once discharged.  Name and phone number of your driver: [name], [phone]  Special Instructions: CHG(Chlorhedine 4%-"Hibiclens","Betasept","Aplicare") Shower Use Special Wash: see special instructions.(avoid face and genitals)   Please read over the following fact sheets that you were given: MRSA Information, Blood Transfusion fact sheet, Incentive Spirometry Instruction.    Failure to follow these instructions may result in Cancellation of your surgery.   Patient signature_______________________________________________________

## 2013-01-11 NOTE — Progress Notes (Signed)
Labs viewable in Epic note PT/PTT- will repeat PT/ INR AM of- pt. Being bridged with Lovenox.Saunders Glance

## 2013-01-12 NOTE — H&P (Signed)
TOTAL KNEE ADMISSION H&P  Patient is being admitted for right total knee arthroplasty.  Subjective:  Chief Complaint:right knee pain.  HPI: Mike Price, 78 y.o. male, has a history of pain and functional disability in the right knee due to arthritis and has failed non-surgical conservative treatments for greater than 12 weeks to includeNSAID's and/or analgesics, corticosteriod injections, viscosupplementation injections and activity modification.  Onset of symptoms was gradual, starting 5 years ago with gradually worsening course since that time. The patient noted no past surgery on the right knee(s).  Patient currently rates pain in the right knee(s) at 6 out of 10 with activity. Patient has night pain, worsening of pain with activity and weight bearing, pain that interferes with activities of daily living, pain with passive range of motion, crepitus and joint swelling.  Patient has evidence of periarticular osteophytes and joint space narrowing by imaging studies. There is no active infection.  Patient Active Problem List   Diagnosis Date Noted  . Atrial fibrillation 12/21/2012  . Long term (current) use of anticoagulants 12/21/2012   Past Medical History  Diagnosis Date  . Hypertension   . Thyroid disease   . TMJ tenderness 01-11-13    right side-has issues from time to time.  . Pacemaker 01-11-13    3'08  . Dysrhythmia 01-11-13     hx. A. Fib-Pacemaker implanted left chest  . Hypothyroidism   . Anxiety 01-11-13    tx. Clonazepam.  . Edema extremities 01-11-13    retains fluid in legs-right greater than left.  . Neuromuscular disorder     neuropathy in feet  . GERD (gastroesophageal reflux disease)   . Arthritis     Osteoarthritis-knees, fingers  . Anemia     Past Surgical History  Procedure Laterality Date  . Coronary artery bypass graft  01-11-13    4 vessels-'00  . Tonsillectomy      child  . Appendectomy    . Insert / replace / remove pacemaker      '08-inserted left chest   . Cervical laminectomy    . Hernia repair    . Joint replacement      LTHA     Current outpatient prescriptions: aspirin 81 MG tablet, Take 81 mg by mouth daily., Disp: , Rfl: ;   atorvastatin (LIPITOR) 40 MG tablet, Take 40 mg by mouth every evening. , Disp: , Rfl: ;   clonazePAM (KLONOPIN) 0.5 MG tablet, Take 0.25-0.5 mg by mouth 3 (three) times daily as needed. Takes 1/2 tablet twice daily then a whole tablet at bedtime, Disp: , Rfl: ;   doxazosin (CARDURA) 2 MG tablet, Take 1 mg by mouth at bedtime., Disp: , Rfl:  enoxaparin (LOVENOX) 100 MG/ML injection, Inject 100 mg into the skin every 12 (twelve) hours., Disp: , Rfl: ;   ezetimibe (ZETIA) 10 MG tablet, Take 10 mg by mouth every evening. , Disp: , Rfl: ;  ferrous sulfate 325 (65 FE) MG tablet, Take 325 mg by mouth every other day. On Monday, Wednesday and Friday, Disp: , Rfl: ;   furosemide (LASIX) 20 MG tablet, Take 10 mg by mouth 2 (two) times daily., Disp: , Rfl:  levothyroxine (SYNTHROID, LEVOTHROID) 75 MCG tablet, Take 75 mcg by mouth daily before breakfast. , Disp: , Rfl: ;   metoprolol (LOPRESSOR) 50 MG tablet, Take 50 mg by mouth 2 (two) times daily., Disp: , Rfl: ;  Multiple Vitamin (MULTIVITAMIN WITH MINERALS) TABS, Take 1 tablet by mouth daily., Disp: , Rfl: ;  omeprazole (PRILOSEC) 20 MG capsule, Take 20 mg by mouth 2 (two) times daily., Disp: , Rfl:  traMADol (ULTRAM) 50 MG tablet, Take 50 mg by mouth every 6 (six) hours as needed for pain., Disp: , Rfl: ;   valsartan (DIOVAN) 160 MG tablet, Take 160 mg by mouth every morning. , Disp: , Rfl: ;   warfarin (COUMADIN) 5 MG tablet, Take 5-7.5 mg by mouth daily. Patient takes 7.5 mg 1 and 1/2 tablet every day except on Monday, Wednesday, and Friday, pt takes 1 tablet for 5 mg dose., Disp: , Rfl:   Allergies  Allergen Reactions  . Lisinopril Cough  . Mefloquine Other (See Comments)    "made me crazy" anxious, upset    History  Substance Use Topics  . Smoking status:  Former Smoker    Quit date: 01/12/1984  . Smokeless tobacco: Not on file  . Alcohol Use: 1.8 oz/week    3 Shots of liquor per week     Comment:  3 ounces daily    Family History Father deceased age 30 due to sepsis; hx of OA, CAD Mother deceased age 70 due to MI  Review of Systems  Constitutional: Positive for diaphoresis. Negative for fever, chills, weight loss and malaise/fatigue.  HENT: Negative.  Negative for neck pain.   Eyes: Negative.   Respiratory: Positive for wheezing. Negative for cough, hemoptysis, sputum production and shortness of breath.   Cardiovascular: Positive for leg swelling. Negative for chest pain, palpitations, orthopnea, claudication and PND.  Gastrointestinal: Positive for constipation. Negative for heartburn, nausea, vomiting, abdominal pain, diarrhea, blood in stool and melena.  Genitourinary: Negative.   Musculoskeletal: Positive for joint pain. Negative for myalgias, back pain and falls.       Right knee pain  Skin: Negative for itching and rash.  Neurological: Positive for tremors. Negative for dizziness, tingling, sensory change, focal weakness, seizures, loss of consciousness and weakness.  Endo/Heme/Allergies: Negative.   Psychiatric/Behavioral: Negative.     Objective:  Physical Exam  Constitutional: He is oriented to person, place, and time. He appears well-developed and well-nourished. No distress.  HENT:  Head: Normocephalic and atraumatic.  Right Ear: External ear normal.  Left Ear: External ear normal.  Nose: Nose normal.  Mouth/Throat: Oropharynx is clear and moist.  Eyes: Conjunctivae are normal.  Neck: Normal range of motion. Neck supple. No tracheal deviation present. No thyromegaly present.  Cardiovascular: Normal rate, normal heart sounds and intact distal pulses.  An irregularly irregular rhythm present.  No murmur heard. Respiratory: Effort normal. No respiratory distress. He has decreased breath sounds. He has no wheezes. He  exhibits no tenderness.  GI: Soft. Bowel sounds are normal. He exhibits no distension and no mass. There is no tenderness.  Musculoskeletal:       Right hip: Normal.       Left hip: Normal.       Right knee: He exhibits decreased range of motion and swelling. He exhibits no effusion and no erythema. Tenderness found. Medial joint line and lateral joint line tenderness noted.       Left knee: He exhibits decreased range of motion. He exhibits no swelling, no effusion and no erythema. No tenderness found.       Right lower leg: He exhibits no tenderness and no swelling.       Left lower leg: He exhibits no tenderness and no swelling.  Evaluation of his right knee shows no effusion. Range about 10 to 120. Marked crepitus on range  of motion, tenderness medial greater than lateral with no instability. The left knee no effusion. Range 5 to 130. No tenderness or instability.  Neurological: He is alert and oriented to person, place, and time. He has normal strength and normal reflexes. No sensory deficit.  Skin: No rash noted. He is not diaphoretic. No erythema.  Psychiatric: He has a normal mood and affect. His behavior is normal.    Pulse: 88 (Regular) BP: 124/86 (Sitting, Left Arm, Standard)   Estimated body mass index is 33.22 kg/(m^2) as calculated from the following:   Height as of 09/03/12: 6' (1.829 m).   Weight as of 09/03/12: 111.131 kg (245 lb).   Imaging Review Plain radiographs demonstrate severe degenerative joint disease of the right knee(s). The overall alignment ismild varus. The bone quality appears to be good for age and reported activity level.  Assessment/Plan:  End stage arthritis, right knee   The patient history, physical examination, clinical judgment of the provider and imaging studies are consistent with end stage degenerative joint disease of the right knee(s) and total knee arthroplasty is deemed medically necessary. The treatment options including  medical management, injection therapy arthroscopy and arthroplasty were discussed at length. The risks and benefits of total knee arthroplasty were presented and reviewed. The risks due to aseptic loosening, infection, stiffness, patella tracking problems, thromboembolic complications and other imponderables were discussed. The patient acknowledged the explanation, agreed to proceed with the plan and consent was signed. Patient is being admitted for inpatient treatment for surgery, pain control, PT, OT, prophylactic antibiotics, VTE prophylaxis, progressive ambulation and ADL's and discharge planning. The patient is planning to be discharged to skilled nursing facility [facility])     Dimitri Ped, [place]

## 2013-01-16 ENCOUNTER — Other Ambulatory Visit: Payer: Self-pay | Admitting: Orthopedic Surgery

## 2013-01-17 ENCOUNTER — Encounter (HOSPITAL_COMMUNITY)
Admission: RE | Disposition: A | Discharge status: Skilled Nursing Facility | Payer: Self-pay | Source: Ambulatory Visit | Attending: Orthopedic Surgery

## 2013-01-17 ENCOUNTER — Encounter (HOSPITAL_COMMUNITY): Payer: Self-pay | Admitting: Anesthesiology

## 2013-01-17 ENCOUNTER — Inpatient Hospital Stay (HOSPITAL_COMMUNITY)
Admission: RE | Admit: 2013-01-17 | Discharge: 2013-01-20 | DRG: 470 | Disposition: A | Discharge status: Skilled Nursing Facility | Payer: Medicare Other | Source: Ambulatory Visit | Attending: Orthopedic Surgery | Admitting: Orthopedic Surgery

## 2013-01-17 ENCOUNTER — Encounter (HOSPITAL_COMMUNITY): Payer: Self-pay | Admitting: *Deleted

## 2013-01-17 ENCOUNTER — Inpatient Hospital Stay (HOSPITAL_COMMUNITY): Payer: Medicare Other | Admitting: Anesthesiology

## 2013-01-17 DIAGNOSIS — Z01812 Encounter for preprocedural laboratory examination: Secondary | ICD-10-CM

## 2013-01-17 DIAGNOSIS — I10 Essential (primary) hypertension: Secondary | ICD-10-CM | POA: Diagnosis present

## 2013-01-17 DIAGNOSIS — G5790 Unspecified mononeuropathy of unspecified lower limb: Secondary | ICD-10-CM | POA: Diagnosis present

## 2013-01-17 DIAGNOSIS — F411 Generalized anxiety disorder: Secondary | ICD-10-CM | POA: Diagnosis present

## 2013-01-17 DIAGNOSIS — M1710 Unilateral primary osteoarthritis, unspecified knee: Principal | ICD-10-CM | POA: Diagnosis present

## 2013-01-17 DIAGNOSIS — Z96651 Presence of right artificial knee joint: Secondary | ICD-10-CM

## 2013-01-17 DIAGNOSIS — K219 Gastro-esophageal reflux disease without esophagitis: Secondary | ICD-10-CM | POA: Diagnosis present

## 2013-01-17 DIAGNOSIS — M179 Osteoarthritis of knee, unspecified: Secondary | ICD-10-CM | POA: Diagnosis present

## 2013-01-17 DIAGNOSIS — D62 Acute posthemorrhagic anemia: Secondary | ICD-10-CM | POA: Diagnosis not present

## 2013-01-17 DIAGNOSIS — E871 Hypo-osmolality and hyponatremia: Secondary | ICD-10-CM

## 2013-01-17 DIAGNOSIS — R609 Edema, unspecified: Secondary | ICD-10-CM | POA: Diagnosis present

## 2013-01-17 DIAGNOSIS — Z7901 Long term (current) use of anticoagulants: Secondary | ICD-10-CM

## 2013-01-17 DIAGNOSIS — Z950 Presence of cardiac pacemaker: Secondary | ICD-10-CM

## 2013-01-17 DIAGNOSIS — I4891 Unspecified atrial fibrillation: Secondary | ICD-10-CM | POA: Diagnosis present

## 2013-01-17 HISTORY — PX: TOTAL KNEE ARTHROPLASTY: SHX125

## 2013-01-17 LAB — PROTIME-INR
INR: 1.04 (ref 0.00–1.49)
Prothrombin Time: 13.5 seconds (ref 11.6–15.2)

## 2013-01-17 LAB — TYPE AND SCREEN
ABO/RH(D): O POS
Antibody Screen: NEGATIVE

## 2013-01-17 SURGERY — ARTHROPLASTY, KNEE, TOTAL
Anesthesia: General | Site: Knee | Laterality: Right | Wound class: Clean

## 2013-01-17 MED ORDER — DEXAMETHASONE SODIUM PHOSPHATE 10 MG/ML IJ SOLN: 10.0000 mg | Freq: Once | INTRAMUSCULAR | Status: AC

## 2013-01-17 MED ORDER — SODIUM CHLORIDE 0.9 % IJ SOLN
INTRAMUSCULAR | Status: DC | PRN
  Administered 2013-01-17: 16:00:00

## 2013-01-17 MED ORDER — 0.9 % SODIUM CHLORIDE (POUR BTL) OPTIME
TOPICAL | Status: DC | PRN
  Administered 2013-01-17: 1000 mL

## 2013-01-17 MED ORDER — ENOXAPARIN SODIUM 30 MG/0.3ML ~~LOC~~ SOLN
30.0000 mg | Freq: Two times a day (BID) | SUBCUTANEOUS | Status: DC
  Administered 2013-01-18 – 2013-01-20 (×5): 30 mg via SUBCUTANEOUS
  Filled 2013-01-17 (×7): qty 0.3

## 2013-01-17 MED ORDER — SODIUM CHLORIDE 0.9 % IV SOLN: INTRAVENOUS | Status: DC

## 2013-01-17 MED ORDER — ASPIRIN EC 81 MG PO TBEC
81.0000 mg | DELAYED_RELEASE_TABLET | Freq: Every day | ORAL | Status: DC
  Administered 2013-01-18 – 2013-01-20 (×3): 81 mg via ORAL
  Filled 2013-01-17 (×3): qty 1

## 2013-01-17 MED ORDER — PHENOL 1.4 % MT LIQD: 1.0000 | OROMUCOSAL | Status: DC | PRN

## 2013-01-17 MED ORDER — OXYCODONE HCL 5 MG PO TABS
5.0000 mg | ORAL_TABLET | ORAL | Status: DC | PRN
  Administered 2013-01-17 – 2013-01-18 (×3): 10 mg via ORAL
  Administered 2013-01-19: 5 mg via ORAL
  Filled 2013-01-17 (×3): qty 2
  Filled 2013-01-17: qty 1

## 2013-01-17 MED ORDER — POLYETHYLENE GLYCOL 3350 17 G PO PACK
17.0000 g | PACK | Freq: Every day | ORAL | Status: DC
  Administered 2013-01-18 – 2013-01-20 (×3): 17 g via ORAL

## 2013-01-17 MED ORDER — ACETAMINOPHEN 10 MG/ML IV SOLN
1000.0000 mg | Freq: Once | INTRAVENOUS | Status: AC
  Administered 2013-01-17: 1000 mg via INTRAVENOUS

## 2013-01-17 MED ORDER — STERILE WATER FOR IRRIGATION IR SOLN
Status: DC | PRN
  Administered 2013-01-17: 3000 mL

## 2013-01-17 MED ORDER — ONDANSETRON HCL 4 MG/2ML IJ SOLN: 4.0000 mg | Freq: Four times a day (QID) | INTRAMUSCULAR | Status: DC | PRN

## 2013-01-17 MED ORDER — ASPIRIN 81 MG PO TABS
81.0000 mg | ORAL_TABLET | Freq: Every day | ORAL | Status: DC
Start: 2013-01-17 — End: 2013-01-17

## 2013-01-17 MED ORDER — WARFARIN SODIUM 7.5 MG PO TABS
7.5000 mg | ORAL_TABLET | Freq: Once | ORAL | Status: AC
  Administered 2013-01-17: 7.5 mg via ORAL
  Filled 2013-01-17: qty 1

## 2013-01-17 MED ORDER — PROMETHAZINE HCL 25 MG/ML IJ SOLN
6.2500 mg | INTRAMUSCULAR | Status: DC | PRN
  Administered 2013-01-17: 6.25 mg via INTRAVENOUS

## 2013-01-17 MED ORDER — TRAMADOL HCL 50 MG PO TABS
50.0000 mg | ORAL_TABLET | Freq: Four times a day (QID) | ORAL | Status: DC | PRN
  Administered 2013-01-18 – 2013-01-20 (×7): 100 mg via ORAL
  Filled 2013-01-17 (×7): qty 2

## 2013-01-17 MED ORDER — FLEET ENEMA 7-19 GM/118ML RE ENEM: 1.0000 | ENEMA | Freq: Once | RECTAL | Status: AC | PRN

## 2013-01-17 MED ORDER — BUPIVACAINE LIPOSOME 1.3 % IJ SUSP
20.0000 mL | Freq: Once | INTRAMUSCULAR | Status: DC
  Filled 2013-01-17: qty 20

## 2013-01-17 MED ORDER — MIDAZOLAM HCL 5 MG/5ML IJ SOLN
INTRAMUSCULAR | Status: DC | PRN
  Administered 2013-01-17 (×4): 1 mg via INTRAVENOUS

## 2013-01-17 MED ORDER — ACETAMINOPHEN 325 MG PO TABS: 650.0000 mg | ORAL_TABLET | Freq: Four times a day (QID) | ORAL | Status: DC | PRN

## 2013-01-17 MED ORDER — IRBESARTAN 150 MG PO TABS
150.0000 mg | ORAL_TABLET | Freq: Every day | ORAL | Status: DC
  Administered 2013-01-18 – 2013-01-20 (×3): 150 mg via ORAL
  Filled 2013-01-17 (×3): qty 1

## 2013-01-17 MED ORDER — METOCLOPRAMIDE HCL 10 MG PO TABS: 5.0000 mg | ORAL_TABLET | Freq: Three times a day (TID) | ORAL | Status: DC | PRN

## 2013-01-17 MED ORDER — ONDANSETRON HCL 4 MG PO TABS: 4.0000 mg | ORAL_TABLET | Freq: Four times a day (QID) | ORAL | Status: DC | PRN

## 2013-01-17 MED ORDER — FUROSEMIDE 20 MG PO TABS
10.0000 mg | ORAL_TABLET | Freq: Two times a day (BID) | ORAL | Status: DC
  Administered 2013-01-18 – 2013-01-20 (×3): 10 mg via ORAL
  Filled 2013-01-17 (×7): qty 0.5

## 2013-01-17 MED ORDER — METHOCARBAMOL 100 MG/ML IJ SOLN: 500.0000 mg | Freq: Four times a day (QID) | INTRAVENOUS | Status: DC | PRN

## 2013-01-17 MED ORDER — DOXAZOSIN MESYLATE 1 MG PO TABS
1.0000 mg | ORAL_TABLET | Freq: Every day | ORAL | Status: DC
  Administered 2013-01-17 – 2013-01-19 (×3): 1 mg via ORAL
  Filled 2013-01-17 (×4): qty 1

## 2013-01-17 MED ORDER — ATORVASTATIN CALCIUM 40 MG PO TABS
40.0000 mg | ORAL_TABLET | Freq: Every evening | ORAL | Status: DC
  Administered 2013-01-18: 40 mg via ORAL
  Filled 2013-01-17 (×2): qty 1

## 2013-01-17 MED ORDER — DEXAMETHASONE 6 MG PO TABS
10.0000 mg | ORAL_TABLET | Freq: Once | ORAL | Status: AC
  Administered 2013-01-18: 10 mg via ORAL
  Filled 2013-01-17: qty 1

## 2013-01-17 MED ORDER — DIPHENHYDRAMINE HCL 12.5 MG/5ML PO ELIX: 12.5000 mg | ORAL_SOLUTION | ORAL | Status: DC | PRN

## 2013-01-17 MED ORDER — KETAMINE HCL 10 MG/ML IJ SOLN
INTRAMUSCULAR | Status: DC | PRN
  Administered 2013-01-17 (×2): 20 mg via INTRAVENOUS

## 2013-01-17 MED ORDER — DOCUSATE SODIUM 100 MG PO CAPS
100.0000 mg | ORAL_CAPSULE | Freq: Two times a day (BID) | ORAL | Status: DC
  Administered 2013-01-17 – 2013-01-20 (×6): 100 mg via ORAL

## 2013-01-17 MED ORDER — FERROUS SULFATE 325 (65 FE) MG PO TABS
325.0000 mg | ORAL_TABLET | ORAL | Status: DC
  Administered 2013-01-19: 325 mg via ORAL
  Filled 2013-01-17: qty 1

## 2013-01-17 MED ORDER — CLONAZEPAM 0.5 MG PO TABS
0.2500 mg | ORAL_TABLET | Freq: Two times a day (BID) | ORAL | Status: DC | PRN
  Administered 2013-01-19: 0.25 mg via ORAL
  Filled 2013-01-17: qty 2
  Filled 2013-01-17: qty 1

## 2013-01-17 MED ORDER — PROPOFOL 10 MG/ML IV BOLUS
INTRAVENOUS | Status: DC | PRN
  Administered 2013-01-17: 20 mg via INTRAVENOUS

## 2013-01-17 MED ORDER — PROPOFOL 10 MG/ML IV EMUL
INTRAVENOUS | Status: DC | PRN
  Administered 2013-01-17: 100 ug/kg/min via INTRAVENOUS

## 2013-01-17 MED ORDER — CEFAZOLIN SODIUM-DEXTROSE 2-3 GM-% IV SOLR
2.0000 g | Freq: Four times a day (QID) | INTRAVENOUS | Status: AC
  Administered 2013-01-17 – 2013-01-18 (×2): 2 g via INTRAVENOUS
  Filled 2013-01-17 (×2): qty 50

## 2013-01-17 MED ORDER — CHLORHEXIDINE GLUCONATE 4 % EX LIQD
60.0000 mL | Freq: Once | CUTANEOUS | Status: DC
Start: 2013-01-17 — End: 2013-01-17

## 2013-01-17 MED ORDER — MENTHOL 3 MG MT LOZG: 1.0000 | LOZENGE | OROMUCOSAL | Status: DC | PRN

## 2013-01-17 MED ORDER — PANTOPRAZOLE SODIUM 40 MG PO TBEC
40.0000 mg | DELAYED_RELEASE_TABLET | Freq: Every day | ORAL | Status: DC
  Filled 2013-01-17: qty 1

## 2013-01-17 MED ORDER — PHENYLEPHRINE HCL 10 MG/ML IJ SOLN
INTRAMUSCULAR | Status: DC | PRN
  Administered 2013-01-17 (×4): 80 ug via INTRAVENOUS

## 2013-01-17 MED ORDER — MORPHINE SULFATE 2 MG/ML IJ SOLN
1.0000 mg | INTRAMUSCULAR | Status: DC | PRN
  Administered 2013-01-17 (×2): 1 mg via INTRAVENOUS
  Filled 2013-01-17 (×2): qty 1

## 2013-01-17 MED ORDER — METOCLOPRAMIDE HCL 5 MG/ML IJ SOLN: 5.0000 mg | Freq: Three times a day (TID) | INTRAMUSCULAR | Status: DC | PRN

## 2013-01-17 MED ORDER — LACTATED RINGERS IV SOLN: INTRAVENOUS | Status: DC

## 2013-01-17 MED ORDER — ACETAMINOPHEN 650 MG RE SUPP: 650.0000 mg | Freq: Four times a day (QID) | RECTAL | Status: DC | PRN

## 2013-01-17 MED ORDER — SODIUM CHLORIDE 0.9 % IV SOLN
10.0000 mg | INTRAVENOUS | Status: DC | PRN
  Administered 2013-01-17: 100 ug/min via INTRAVENOUS

## 2013-01-17 MED ORDER — CEFAZOLIN SODIUM-DEXTROSE 2-3 GM-% IV SOLR
2.0000 g | INTRAVENOUS | Status: AC
  Administered 2013-01-17: 2 g via INTRAVENOUS

## 2013-01-17 MED ORDER — HYDROMORPHONE HCL PF 1 MG/ML IJ SOLN
0.2500 mg | INTRAMUSCULAR | Status: DC | PRN
  Administered 2013-01-17: 0.25 mg via INTRAVENOUS

## 2013-01-17 MED ORDER — METOPROLOL TARTRATE 50 MG PO TABS
50.0000 mg | ORAL_TABLET | Freq: Two times a day (BID) | ORAL | Status: DC
  Administered 2013-01-17 – 2013-01-19 (×4): 50 mg via ORAL
  Filled 2013-01-17 (×5): qty 1

## 2013-01-17 MED ORDER — SODIUM CHLORIDE 0.45 % IV SOLN
INTRAVENOUS | Status: DC
  Administered 2013-01-18 (×2): via INTRAVENOUS

## 2013-01-17 MED ORDER — BISACODYL 10 MG RE SUPP: 10.0000 mg | Freq: Every day | RECTAL | Status: DC | PRN

## 2013-01-17 MED ORDER — BUPIVACAINE IN DEXTROSE 0.75-8.25 % IT SOLN
INTRATHECAL | Status: DC | PRN
  Administered 2013-01-17: 2 mL via INTRATHECAL

## 2013-01-17 MED ORDER — METHOCARBAMOL 500 MG PO TABS
500.0000 mg | ORAL_TABLET | Freq: Four times a day (QID) | ORAL | Status: DC | PRN
  Administered 2013-01-18 – 2013-01-20 (×6): 500 mg via ORAL
  Filled 2013-01-17 (×6): qty 1

## 2013-01-17 MED ORDER — FENTANYL CITRATE 0.05 MG/ML IJ SOLN
INTRAMUSCULAR | Status: DC | PRN
  Administered 2013-01-17: 100 ug via INTRAVENOUS

## 2013-01-17 MED ORDER — LEVOTHYROXINE SODIUM 75 MCG PO TABS
75.0000 ug | ORAL_TABLET | Freq: Every day | ORAL | Status: DC
  Administered 2013-01-18 – 2013-01-20 (×3): 75 ug via ORAL
  Filled 2013-01-17 (×4): qty 1

## 2013-01-17 MED ORDER — EZETIMIBE 10 MG PO TABS
10.0000 mg | ORAL_TABLET | Freq: Every evening | ORAL | Status: DC
  Administered 2013-01-18: 10 mg via ORAL
  Filled 2013-01-17 (×2): qty 1

## 2013-01-17 MED ORDER — CLONAZEPAM 0.5 MG PO TABS
0.5000 mg | ORAL_TABLET | Freq: Every evening | ORAL | Status: DC | PRN
  Administered 2013-01-17 – 2013-01-19 (×3): 0.5 mg via ORAL
  Filled 2013-01-17 (×2): qty 1

## 2013-01-17 MED ORDER — WARFARIN - PHARMACIST DOSING INPATIENT: Freq: Every day | Status: DC

## 2013-01-17 MED ORDER — EPHEDRINE SULFATE 50 MG/ML IJ SOLN
INTRAMUSCULAR | Status: DC | PRN
  Administered 2013-01-17 (×2): 10 mg via INTRAVENOUS

## 2013-01-17 MED ORDER — LACTATED RINGERS IV SOLN
INTRAVENOUS | Status: DC
  Administered 2013-01-17: 1000 mL via INTRAVENOUS
  Administered 2013-01-17 (×2): via INTRAVENOUS

## 2013-01-17 MED ORDER — ACETAMINOPHEN 10 MG/ML IV SOLN
1000.0000 mg | Freq: Four times a day (QID) | INTRAVENOUS | Status: AC
  Administered 2013-01-17 – 2013-01-18 (×4): 1000 mg via INTRAVENOUS
  Filled 2013-01-17 (×7): qty 100

## 2013-01-17 SURGICAL SUPPLY — 53 items
BAG ZIPLOCK 12X15 (MISCELLANEOUS) ×2 IMPLANT
BANDAGE ELASTIC 6 VELCRO ST LF (GAUZE/BANDAGES/DRESSINGS) ×2 IMPLANT
BANDAGE ESMARK 6X9 LF (GAUZE/BANDAGES/DRESSINGS) ×1 IMPLANT
BLADE SAG 18X100X1.27 (BLADE) ×2 IMPLANT
BLADE SAW SGTL 11.0X1.19X90.0M (BLADE) ×2 IMPLANT
BNDG ESMARK 6X9 LF (GAUZE/BANDAGES/DRESSINGS) ×2
BOWL SMART MIX CTS (DISPOSABLE) ×2 IMPLANT
CEMENT HV SMART SET (Cement) ×4 IMPLANT
CLOTH BEACON ORANGE TIMEOUT ST (SAFETY) ×2 IMPLANT
CUFF TOURN SGL QUICK 34 (TOURNIQUET CUFF) ×2
CUFF TRNQT CYL 34X4X40X1 (TOURNIQUET CUFF) ×1 IMPLANT
DRAPE EXTREMITY T 121X128X90 (DRAPE) ×2 IMPLANT
DRAPE POUCH INSTRU U-SHP 10X18 (DRAPES) ×2 IMPLANT
DRAPE U-SHAPE 47X51 STRL (DRAPES) ×2 IMPLANT
DRSG ADAPTIC 3X8 NADH LF (GAUZE/BANDAGES/DRESSINGS) ×2 IMPLANT
DURAPREP 26ML APPLICATOR (WOUND CARE) ×2 IMPLANT
ELECT REM PT RETURN 9FT ADLT (ELECTROSURGICAL) ×2
ELECTRODE REM PT RTRN 9FT ADLT (ELECTROSURGICAL) ×1 IMPLANT
EVACUATOR 1/8 PVC DRAIN (DRAIN) ×2 IMPLANT
FACESHIELD LNG OPTICON STERILE (SAFETY) ×10 IMPLANT
GLOVE BIO SURGEON STRL SZ7.5 (GLOVE) ×2 IMPLANT
GLOVE BIO SURGEON STRL SZ8 (GLOVE) ×2 IMPLANT
GLOVE BIOGEL PI IND STRL 8 (GLOVE) ×2 IMPLANT
GLOVE BIOGEL PI INDICATOR 8 (GLOVE) ×2
GLOVE SURG SS PI 6.5 STRL IVOR (GLOVE) ×4 IMPLANT
GOWN STRL NON-REIN LRG LVL3 (GOWN DISPOSABLE) ×4 IMPLANT
GOWN STRL REIN XL XLG (GOWN DISPOSABLE) ×2 IMPLANT
HANDPIECE INTERPULSE COAX TIP (DISPOSABLE) ×1
IMMOBILIZER KNEE 20 (SOFTGOODS) ×2
IMMOBILIZER KNEE 20 THIGH 36 (SOFTGOODS) ×1 IMPLANT
KIT BASIN OR (CUSTOM PROCEDURE TRAY) ×2 IMPLANT
MANIFOLD NEPTUNE II (INSTRUMENTS) ×2 IMPLANT
NDL SAFETY ECLIPSE 18X1.5 (NEEDLE) ×1 IMPLANT
NEEDLE HYPO 18GX1.5 SHARP (NEEDLE) ×1
NEEDLE MAYO .5 CIRCLE (NEEDLE) ×2 IMPLANT
NS IRRIG 1000ML POUR BTL (IV SOLUTION) ×2 IMPLANT
PACK TOTAL JOINT (CUSTOM PROCEDURE TRAY) ×2 IMPLANT
PAD ABD 7.5X8 STRL (GAUZE/BANDAGES/DRESSINGS) ×2 IMPLANT
PADDING CAST COTTON 6X4 STRL (CAST SUPPLIES) ×2 IMPLANT
POSITIONER SURGICAL ARM (MISCELLANEOUS) ×2 IMPLANT
SET HNDPC FAN SPRY TIP SCT (DISPOSABLE) ×1 IMPLANT
SPONGE GAUZE 4X4 12PLY (GAUZE/BANDAGES/DRESSINGS) ×2 IMPLANT
STRIP CLOSURE SKIN 1/2X4 (GAUZE/BANDAGES/DRESSINGS) ×2 IMPLANT
SUCTION FRAZIER 12FR DISP (SUCTIONS) ×2 IMPLANT
SUT MNCRL AB 4-0 PS2 18 (SUTURE) ×2 IMPLANT
SUT VIC AB 2-0 CT1 27 (SUTURE) ×6
SUT VIC AB 2-0 CT1 TAPERPNT 27 (SUTURE) ×3 IMPLANT
SUT VLOC 180 0 24IN GS25 (SUTURE) ×2 IMPLANT
SYR 50ML LL SCALE MARK (SYRINGE) ×2 IMPLANT
TOWEL OR 17X26 10 PK STRL BLUE (TOWEL DISPOSABLE) ×4 IMPLANT
TRAY FOLEY CATH 14FRSI W/METER (CATHETERS) ×2 IMPLANT
WATER STERILE IRR 1500ML POUR (IV SOLUTION) ×4 IMPLANT
WRAP KNEE MAXI GEL POST OP (GAUZE/BANDAGES/DRESSINGS) ×4 IMPLANT

## 2013-01-17 NOTE — Progress Notes (Signed)
ANTICOAGULATION CONSULT NOTE - Initial Consult  Pharmacy Consult for:  Coumadin Indication:   Atrial fibrillation and VTE prophylaxis following right TKA   Allergies  Allergen Reactions  . Lisinopril Cough  . Mefloquine Other (See Comments)    "made me crazy" anxious, upset    Patient Measurements: Height: 5\' 10"  (177.8 cm) Weight: 237 lb (107.502 kg) IBW/kg (Calculated) : 73   Vital Signs: Temp: 98.6 F (37 C) (04/14 1950) Temp src: Oral (04/14 1850) BP: 138/70 mmHg (04/14 1950) Pulse Rate: 60 (04/14 1950)  Labs:  Recent Labs  01/17/13 1225  LABPROT 13.5  INR 1.04    Estimated Creatinine Clearance: 78.7 ml/min (by C-G formula based on Cr of 0.98).   Medical History: Past Medical History  Diagnosis Date  . Hypertension   . Thyroid disease   . TMJ tenderness 01-11-13    right side-has issues from time to time.  . Pacemaker 01-11-13    3'08  . Dysrhythmia 01-11-13     hx. A. Fib-Pacemaker implanted left chest  . Hypothyroidism   . Anxiety 01-11-13    tx. Clonazepam.  . Edema extremities 01-11-13    retains fluid in legs-right greater than left.  . Neuromuscular disorder     neuropathy in feet  . GERD (gastroesophageal reflux disease)   . Arthritis     Osteoarthritis-knees, fingers  . Anemia     Medications:  Scheduled:  . [COMPLETED] acetaminophen  1,000 mg Intravenous Once  . acetaminophen  1,000 mg Intravenous Q6H  . [START ON 01/18/2013] aspirin EC  81 mg Oral Daily  . [START ON 01/18/2013] atorvastatin  40 mg Oral QPM  . [COMPLETED]  ceFAZolin (ANCEF) IV  2 g Intravenous On Call to OR  .  ceFAZolin (ANCEF) IV  2 g Intravenous Q6H  . [START ON 01/18/2013] dexamethasone  10 mg Oral Once   Or  . [START ON 01/18/2013] dexamethasone  10 mg Intravenous Once  . docusate sodium  100 mg Oral BID  . doxazosin  1 mg Oral QHS  . [START ON 01/18/2013] enoxaparin (LOVENOX) injection  30 mg Subcutaneous Q12H  . [START ON 01/18/2013] ezetimibe  10 mg Oral QPM  . [START  ON 01/19/2013] ferrous sulfate  325 mg Oral Custom  . [START ON 01/18/2013] furosemide  10 mg Oral BID  . [START ON 01/18/2013] irbesartan  150 mg Oral Daily  . [START ON 01/18/2013] levothyroxine  75 mcg Oral QAC breakfast  . metoprolol  50 mg Oral BID  . [START ON 01/18/2013] pantoprazole  40 mg Oral Daily  . polyethylene glycol  17 g Oral Daily  . [DISCONTINUED] aspirin  81 mg Oral Daily  . [DISCONTINUED] bupivacaine liposome  20 mL Infiltration Once  . [DISCONTINUED] chlorhexidine  60 mL Topical Once    Assessment:  Asked to assist with Coumadin therapy for this 78 year-old male following right total knee arthroplasty today.  Mr. Haffey receives chronic anticoagulation with Coumadin due to atrial fibrillation.  The usual home dose is documented as 5 mg on Monday, Wednesday, Friday, and 7.5 mg on all other days of the week.  Lovenox 30 mg subq every 12 hours has been ordered until the INR is >/= 1.8.  Goals of Therapy:   INR 2-3  Monitor platelets by anticoagulation protocol: Yes   Plan:   Give 7.5 mg tonight  Follow PT/INR daily  [employer] R.Ph. 01/17/2013 8:45 PM

## 2013-01-17 NOTE — Anesthesia Preprocedure Evaluation (Addendum)
Anesthesia Evaluation  Patient identified by MRN, date of birth, ID band Patient awake    Reviewed: Allergy & Precautions, H&P , NPO status , Patient's Chart, lab work & pertinent test results  Airway Mallampati: III TM Distance: >3 FB Neck ROM: Full    Dental  (+) Teeth Intact and Dental Advisory Given   Pulmonary neg pulmonary ROS,  breath sounds clear to auscultation  Pulmonary exam normal       Cardiovascular hypertension, Pt. on medications and Pt. on home beta blockers + CAD and + CABG + dysrhythmias Atrial Fibrillation + pacemaker Rhythm:Irregular Rate:Tachycardia     Neuro/Psych Anxiety Severe claustrophobia following CABG Neuromuscular disease negative neurological ROS  negative psych ROS   GI/Hepatic Neg liver ROS, GERD-  Medicated,  Endo/Other  Hypothyroidism Morbid obesity  Renal/GU negative Renal ROS  negative genitourinary   Musculoskeletal negative musculoskeletal ROS (+)   Abdominal (+) + obese,   Peds  Hematology negative hematology ROS (+)   Anesthesia Other Findings   Reproductive/Obstetrics                           Anesthesia Physical Anesthesia Plan  ASA: III  Anesthesia Plan: Spinal   Post-op Pain Management:    Induction: Intravenous  Airway Management Planned: Simple Face Mask  Additional Equipment:   Intra-op Plan:   Post-operative Plan:   Informed Consent: I have reviewed the patients History and Physical, chart, labs and discussed the procedure including the risks, benefits and alternatives for the proposed anesthesia with the patient or authorized representative who has indicated his/her understanding and acceptance.   Dental advisory given  Plan Discussed with: CRNA  Anesthesia Plan Comments:        Anesthesia Quick Evaluation

## 2013-01-17 NOTE — Anesthesia Postprocedure Evaluation (Signed)
Anesthesia Post Note  Patient: Mike Price  Procedure(s) Performed: Procedure(s) (LRB): RIGHT TOTAL KNEE ARTHROPLASTY (Right)  Anesthesia type: Spinal  Patient location: PACU  Post pain: Pain level controlled  Post assessment: Post-op Vital signs reviewed  Last Vitals:  Filed Vitals:   01/17/13 1825  BP: 107/53  Pulse: 60  Temp:   Resp: 11    Post vital signs: Reviewed  Level of consciousness: sedated  Complications: No apparent anesthesia complications

## 2013-01-17 NOTE — Preoperative (Signed)
Beta Blockers   Reason not to administer Beta Blockers:Took Metoprolol this am. 

## 2013-01-17 NOTE — Interval H&P Note (Signed)
History and Physical Interval Note:  01/17/2013 1:37 PM  Mike Price  has presented today for surgery, with the diagnosis of osteoarthritis of the right knee  The various methods of treatment have been discussed with the patient and family. After consideration of risks, benefits and other options for treatment, the patient has consented to  Procedure(s): RIGHT TOTAL KNEE ARTHROPLASTY (Right) as a surgical intervention .  The patient's history has been reviewed, patient examined, no change in status, stable for surgery.  I have reviewed the patient's chart and labs.  Questions were answered to the patient's satisfaction.     Loanne Drilling

## 2013-01-17 NOTE — Op Note (Signed)
Pre-operative diagnosis- Osteoarthritis  Right knee(s)  Post-operative diagnosis- Osteoarthritis Right knee(s)  Procedure-  Right  Total Knee Arthroplasty  Surgeon- Gus Rankin. Hristopher Missildine, MD  Assistant- Avel Peace, PA-C   Anesthesia-  Spinal EBL-* No blood loss amount entered *  Drains Hemovac  Tourniquet time-  39 minutes @ 300 mm Hg Complications- None  Condition-PACU - hemodynamically stable.   Brief Clinical Note  Mike Price is a 78 y.o. year old male with end stage OA of his right knee with progressively worsening pain and dysfunction. He has constant pain, with activity and at rest and significant functional deficits with difficulties even with ADLs. He has had extensive non-op management including analgesics, injections of cortisone and viscosupplements, and home exercise program, but remains in significant pain with significant dysfunction. Radiographs show bone on bone arthritis medial and patellofemoral. He presents now for right Total Knee Arthroplasty.    Procedure in detail---   The patient is brought into the operating room and positioned supine on the operating table. After successful administration of  Spinal,   a tourniquet is placed high on the  Right thigh(s) and the lower extremity is prepped and draped in the usual sterile fashion. Time out is performed by the operating team and then the  Right lower extremity is wrapped in Esmarch, knee flexed and the tourniquet inflated to 300 mmHg.       A midline incision is made with a ten blade through the subcutaneous tissue to the level of the extensor mechanism. A fresh blade is used to make a medial parapatellar arthrotomy. Soft tissue over the proximal medial tibia is subperiosteally elevated to the joint line with a knife and into the semimembranosus bursa with a Cobb elevator. Soft tissue over the proximal lateral tibia is elevated with attention being paid to avoiding the patellar tendon on the tibial tubercle. The patella  is everted, knee flexed 90 degrees and the ACL and PCL are removed. Findings are bone on bone medial and patellofemoral with large medial osteophytes.        The drill is used to create a starting hole in the distal femur and the canal is thoroughly irrigated with sterile saline to remove the fatty contents. The 5 degree Right  valgus alignment guide is placed into the femoral canal and the distal femoral cutting block is pinned to remove 10 mm off the distal femur. Resection is made with an oscillating saw.      The tibia is subluxed forward and the menisci are removed. The extramedullary alignment guide is placed referencing proximally at the medial aspect of the tibial tubercle and distally along the second metatarsal axis and tibial crest. The block is pinned to remove 2mm off the more deficient medial  side. Resection is made with an oscillating saw. Size 4is the most appropriate size for the tibia and the proximal tibia is prepared with the modular drill and keel punch for that size.      The femoral sizing guide is placed and size 5 is most appropriate. Rotation is marked off the epicondylar axis and confirmed by creating a rectangular flexion gap at 90 degrees. The size 5 cutting block is pinned in this rotation and the anterior, posterior and chamfer cuts are made with the oscillating saw. The intercondylar block is then placed and that cut is made.      Trial size 4 tibial component, trial size 5 posterior stabilized femur and a 12.5  mm posterior stabilized rotating platform  insert trial is placed. Full extension is achieved with excellent varus/valgus and anterior/posterior balance throughout full range of motion. The patella is everted and thickness measured to be 25  mm. Free hand resection is taken to 15 mm, a 38 template is placed, lug holes are drilled, trial patella is placed, and it tracks normally. Osteophytes are removed off the posterior femur with the trial in place. All trials are removed  and the cut bone surfaces prepared with pulsatile lavage. Cement is mixed and once ready for implantation, the size 4 tibial implant, size  5 posterior stabilized femoral component, and the size 38 patella are cemented in place and the patella is held with the clamp. The trial insert is placed and the knee held in full extension. The Exparel (20 ml mixed with 50 ml saline) is injected into the extensor mechanism, posterior capsule, medial and lateral gutters and subcutaneous tissues.  All extruded cement is removed and once the cement is hard the permanent 12.5 mm posterior stabilized rotating platform insert is placed into the tibial tray.      The wound is copiously irrigated with saline solution and the extensor mechanism closed over a hemovac drain with #1 PDS suture. The tourniquet is released for a total tourniquet time of 39  minutes. Flexion against gravity is 140 degrees and the patella tracks normally. Subcutaneous tissue is closed with 2.0 vicryl and subcuticular with running 4.0 Monocryl. The incision is cleaned and dried and steri-strips and a bulky sterile dressing are applied. The limb is placed into a knee immobilizer and the patient is awakened and transported to recovery in stable condition.      Please note that a surgical assistant was a medical necessity for this procedure in order to perform it in a safe and expeditious manner. Surgical assistant was necessary to retract the ligaments and vital neurovascular structures to prevent injury to them and also necessary for proper positioning of the limb to allow for anatomic placement of the prosthesis.   Gus Rankin Bridgett Hattabaugh, MD    01/17/2013, 4:57 PM

## 2013-01-17 NOTE — Anesthesia Procedure Notes (Signed)
Spinal  Patient location during procedure: OR Start time: 01/17/2013 3:50 PM End time: 01/17/2013 3:55 PM Staffing Anesthesiologist: Lucille Passy F Performed by: anesthesiologist  Preanesthetic Checklist Completed: patient identified, site marked, surgical consent, pre-op evaluation, timeout performed, IV checked, risks and benefits discussed and monitors and equipment checked Spinal Block Patient position: sitting Prep: Betadine Patient monitoring: heart rate, continuous pulse ox and blood pressure Injection technique: single-shot Needle Needle type: Spinocan  Needle gauge: 22 G Needle length: 9 cm Additional Notes Expiration date of kit checked and confirmed. Patient tolerated procedure well, without complications. Lot [id number] DOE 01/2014

## 2013-01-17 NOTE — Transfer of Care (Signed)
Immediate Anesthesia Transfer of Care Note  Patient: Mike Price  Procedure(s) Performed: Procedure(s): RIGHT TOTAL KNEE ARTHROPLASTY (Right)  Patient Location: PACU  Anesthesia Type:Spinal  Level of Consciousness: awake, alert , oriented and patient cooperative  Airway & Oxygen Therapy: Patient Spontanous Breathing and Patient connected to nasal cannula oxygen  Post-op Assessment: Report given to PACU RN, Post -op Vital signs reviewed and stable and SAB level with sensory sensation to toes but unable to move toes.  Post vital signs: Reviewed and stable  Complications: No apparent anesthesia complications

## 2013-01-18 DIAGNOSIS — E871 Hypo-osmolality and hyponatremia: Secondary | ICD-10-CM

## 2013-01-18 LAB — CBC
HCT: 38.5 % — ABNORMAL LOW (ref 39.0–52.0)
Hemoglobin: 13.2 g/dL (ref 13.0–17.0)
MCH: 35.1 pg — ABNORMAL HIGH (ref 26.0–34.0)
MCV: 102.4 fL — ABNORMAL HIGH (ref 78.0–100.0)
Platelets: 123 10*3/uL — ABNORMAL LOW (ref 150–400)
RBC: 3.76 MIL/uL — ABNORMAL LOW (ref 4.22–5.81)

## 2013-01-18 LAB — BASIC METABOLIC PANEL
BUN: 15 mg/dL (ref 6–23)
CO2: 28 mEq/L (ref 19–32)
Calcium: 8.9 mg/dL (ref 8.4–10.5)
Creatinine, Ser: 0.77 mg/dL (ref 0.50–1.35)
Glucose, Bld: 108 mg/dL — ABNORMAL HIGH (ref 70–99)

## 2013-01-18 MED ORDER — NON FORMULARY: 20.0000 mg | Freq: Two times a day (BID) | Status: DC

## 2013-01-18 MED ORDER — OMEPRAZOLE 20 MG PO CPDR
20.0000 mg | DELAYED_RELEASE_CAPSULE | Freq: Two times a day (BID) | ORAL | Status: DC
  Administered 2013-01-18 – 2013-01-19 (×3): 20 mg via ORAL
  Filled 2013-01-18 (×5): qty 1

## 2013-01-18 MED ORDER — WARFARIN SODIUM 10 MG PO TABS
10.0000 mg | ORAL_TABLET | Freq: Once | ORAL | Status: AC
  Administered 2013-01-18: 10 mg via ORAL
  Filled 2013-01-18: qty 1

## 2013-01-18 MED ORDER — FUROSEMIDE 10 MG/ML IJ SOLN
20.0000 mg | Freq: Once | INTRAMUSCULAR | Status: AC
  Administered 2013-01-18: 20 mg via INTRAVENOUS
  Filled 2013-01-18: qty 2

## 2013-01-18 NOTE — Progress Notes (Signed)
Utilization review completed.  

## 2013-01-18 NOTE — Progress Notes (Signed)
Clinical Social Work Department BRIEF PSYCHOSOCIAL ASSESSMENT 01/18/2013  Patient:  ARDITH, LEWMAN     Account Number:  [id number]     Admit date:  01/17/2013  Clinical Social Worker:  Sofie Hartigan, MSW  Date/Time:  01/18/2013 12:00 M  Referred by:  Physician  Date Referred:  01/18/2013 Referred for  SNF Placement   Other Referral:   Interview type:  Patient Other interview type:    PSYCHOSOCIAL DATA Living Status:  WIFE Admitted from facility:   Level of care:   Primary support name:  Faith Primary support relationship to patient:  SPOUSE Degree of support available:   supportive    CURRENT CONCERNS Current Concerns  Post-Acute Placement   Other Concerns:    SOCIAL WORK ASSESSMENT / PLAN Pt is a 78 yr old gentleman living at home prior to hospitalization. CSW met with pt to assist with d/c planning. Pt has made prior arrangements to have ST Rehab at [facility] following hospital d/c. CSW has contacted SNF and d/c plan has been confirmed. CSW will continue to follow to assist with d/c planning to SNF.   Assessment/plan status:  Psychosocial Support/Ongoing Assessment of Needs Other assessment/ plan:   Information/referral to community resources:   None needed at this time.    PATIENT'S/FAMILY'S RESPONSE TO PLAN OF CARE: Pt is looking forward to having rehab at [facility].   Cori Razor LCSW [phone]

## 2013-01-18 NOTE — Evaluation (Signed)
Physical Therapy Evaluation Patient Details Name: Mike Price MRN: [id number] DOB: 08/16/1935 Today's Date: 01/18/2013 Time: 4098-1191 PT Time Calculation (min): 34 min  PT Assessment / Plan / Recommendation Clinical Impression  Pt s/p R TKR presents with decreased R LE strength/ROM and post op pain limiting functional mobility    PT Assessment  Patient needs continued PT services    Follow Up Recommendations  SNF    Does the patient have the potential to tolerate intense rehabilitation      Barriers to Discharge        Equipment Recommendations  None recommended by PT    Recommendations for Other Services OT consult   Frequency 7X/week    Precautions / Restrictions Precautions Precautions: Fall;Knee Restrictions Weight Bearing Restrictions: No Other Position/Activity Restrictions: WBAT   Pertinent Vitals/Pain 5/10; premed, cold packs provided      Mobility  Bed Mobility Bed Mobility: Supine to Sit Supine to Sit: 1: +2 Total assist;With rails Supine to Sit: Patient Percentage: 60% Details for Bed Mobility Assistance: cues for sequence and use of L LE to self assist; physical assist with R LE and to bring trunk to upright posture Transfers Transfers: Sit to Stand;Stand to Sit Sit to Stand: 1: +2 Total assist;With upper extremity assist;From bed;From elevated surface Sit to Stand: Patient Percentage: 60% Stand to Sit: 1: +2 Total assist;To chair/3-in-1;With armrests;With upper extremity assist Stand to Sit: Patient Percentage: 60% Details for Transfer Assistance: cues for LE management and use of UEs to self assist Ambulation/Gait Ambulation/Gait Assistance: 1: +2 Total assist Ambulation/Gait: Patient Percentage: 70% Ambulation Distance (Feet): 12 Feet Assistive device: Rolling walker Ambulation/Gait Assistance Details: cues for sequence, posture, stride length and position from RW Gait Pattern: Step-to pattern;Decreased step length - right;Decreased step  length - left;Antalgic    Exercises Total Joint Exercises Ankle Circles/Pumps: AROM;10 reps;Supine;Both Quad Sets: AROM;10 reps;Supine;Both Heel Slides: AAROM;Right;10 reps;Supine Straight Leg Raises: AAROM;10 reps;Supine;Right Goniometric ROM: AAROM at knee -10 - 40   PT Diagnosis: Difficulty walking  PT Problem List: Decreased strength;Decreased range of motion;Decreased activity tolerance;Decreased mobility;Decreased knowledge of use of DME;Obesity PT Treatment Interventions: DME instruction;Gait training;Stair training;Functional mobility training;Therapeutic activities;Therapeutic exercise;Patient/family education   PT Goals Acute Rehab PT Goals PT Goal Formulation: With patient Time For Goal Achievement: 01/26/13 Potential to Achieve Goals: Good Pt will go Supine/Side to Sit: with supervision PT Goal: Supine/Side to Sit - Progress: Goal set today Pt will go Sit to Supine/Side: with supervision PT Goal: Sit to Supine/Side - Progress: Goal set today Pt will go Sit to Stand: with supervision PT Goal: Sit to Stand - Progress: Goal set today Pt will go Stand to Sit: with supervision PT Goal: Stand to Sit - Progress: Goal set today Pt will Ambulate: 51 - 150 feet;with supervision;with rolling walker PT Goal: Ambulate - Progress: Goal set today  Visit Information  Last PT Received On: 01/18/13 Assistance Needed: +2    Subjective Data  Subjective: I had to use a cane before  Patient Stated Goal: Rehab and home to resume previous lifestyle with decreased pain   Prior Functioning  Home Living Lives With: Spouse Prior Function Level of Independence: Independent with assistive device(s) Able to Take Stairs?: Yes Driving: Yes Communication Communication: No difficulties Dominant Hand: Right    Cognition  Cognition Overall Cognitive Status: Appears within functional limits for tasks assessed/performed Arousal/Alertness: Awake/alert Orientation Level: Appears intact for  tasks assessed Behavior During Session: [facility] for tasks performed    Extremity/Trunk Assessment Right Upper Extremity  Assessment RUE ROM/Strength/Tone: [facility] for tasks assessed Left Upper Extremity Assessment LUE ROM/Strength/Tone: WFL for tasks assessed Right Lower Extremity Assessment RLE ROM/Strength/Tone: Deficits RLE ROM/Strength/Tone Deficits: 2/5 quads with AAROM -10 - 40 Left Lower Extremity Assessment LLE ROM/Strength/Tone: WFL for tasks assessed   Balance    End of Session PT - End of Session Equipment Utilized During Treatment: Gait belt;Right knee immobilizer Activity Tolerance: Patient tolerated treatment well Patient left: in chair;with call bell/phone within reach;with family/visitor present Nurse Communication: Mobility status  GP     Alaa Eyerman 01/18/2013, 12:45 PM

## 2013-01-18 NOTE — Progress Notes (Signed)
Clinical Social Work Department CLINICAL SOCIAL WORK PLACEMENT NOTE 01/18/2013  Patient:  Mike Price, Mike Price  Account Number:  [id number] Admit date:  01/17/2013  Clinical Social Worker:  Cori Razor, LCSW  Date/time:  01/18/2013 02:24 PM  Clinical Social Work is seeking post-discharge placement for this patient at the following level of care:   SKILLED NURSING   (*CSW will update this form in Epic as items are completed)     Patient/family provided with [facility] Department of Clinical Social Work's list of facilities offering this level of care within the geographic area requested by the patient (or if unable, by the patient's family).  01/18/2013  Patient/family informed of their freedom to choose among providers that offer the needed level of care, that participate in Medicare, Medicaid or managed care program needed by the patient, have an available bed and are willing to accept the patient.    Patient/family informed of [facility]' ownership interest in [facility], as well as of the fact that they are under no obligation to receive care at this facility.  PASARR submitted to EDS on 01/18/2013 PASARR number received from EDS on 01/18/2013  FL2 transmitted to all facilities in geographic area requested by pt/family on  01/18/2013 FL2 transmitted to all facilities within larger geographic area on   Patient informed that his/her managed care company has contracts with or will negotiate with  certain facilities, including the following:     Patient/family informed of bed offers received:  01/18/2013 Patient chooses bed at [facility] Physician recommends and patient chooses bed at    Patient to be transferred to [facility] on   Patient to be transferred to facility by   The following physician request were entered in Epic:   Additional Comments:  Cori Razor LCSW [phone]

## 2013-01-18 NOTE — Progress Notes (Signed)
Subjective: 1 Day Post-Op Procedure(s) (LRB): RIGHT TOTAL KNEE ARTHROPLASTY (Right) Patient reports pain as mild and moderate.   Patient seen in rounds by Dr. Lequita Halt. Patient is well, but has had some minor complaints of pain in the knee, requiring pain medications We will start therapy today.  Plan is to go Skilled nursing facility after hospital stay.  Wants to look into [place].  Objective: Vital signs in last 24 hours: Temp:  [97.5 F (36.4 C)-98.6 F (37 C)] 98.1 F (36.7 C) (04/15 0519) Pulse Rate:  [59-105] 64 (04/15 0519) Resp:  [11-20] 16 (04/15 0519) BP: (98-152)/(48-87) 139/74 mmHg (04/15 0519) SpO2:  [97 %-100 %] 100 % (04/15 0519) FiO2 (%):  [100 %] 100 % (04/14 1850) Weight:  [107.502 kg (237 lb)] 107.502 kg (237 lb) (04/14 1850)  Intake/Output from previous day:  Intake/Output Summary (Last 24 hours) at 01/18/13 0810 Last data filed at 01/18/13 0701  Gross per 24 hour  Intake 4221.66 ml  Output   1085 ml  Net 3136.66 ml    Intake/Output this shift: UOP since MN 325 +3136  Labs:  Recent Labs  01/18/13 0430  HGB 13.2    Recent Labs  01/18/13 0430  WBC 8.6  RBC 3.76*  HCT 38.5*  PLT 123*    Recent Labs  01/18/13 0430  NA 134*  K 4.5  CL 97  CO2 28  BUN 15  CREATININE 0.77  GLUCOSE 108*  CALCIUM 8.9    Recent Labs  01/17/13 1225 01/18/13 0430  INR 1.04 1.02    EXAM General - Patient is Alert, Appropriate and Oriented Extremity - Neurovascular intact Sensation intact distally Dorsiflexion/Plantar flexion intact Dressing - dressing C/D/I Motor Function - intact, moving foot and toes well on exam.  Hemovac pulled without difficulty.  Past Medical History  Diagnosis Date  . Hypertension   . Thyroid disease   . TMJ tenderness 01-11-13    right side-has issues from time to time.  . Pacemaker 01-11-13    3'08  . Dysrhythmia 01-11-13     hx. A. Fib-Pacemaker implanted left chest  . Hypothyroidism   . Anxiety 01-11-13   tx. Clonazepam.  . Edema extremities 01-11-13    retains fluid in legs-right greater than left.  . Neuromuscular disorder     neuropathy in feet  . GERD (gastroesophageal reflux disease)   . Arthritis     Osteoarthritis-knees, fingers  . Anemia     Assessment/Plan: 1 Day Post-Op Procedure(s) (LRB): RIGHT TOTAL KNEE ARTHROPLASTY (Right) Principal Problem:   OA (osteoarthritis) of knee Active Problems:   Postop Hyponatremia  Estimated body mass index is 34.01 kg/(m^2) as calculated from the following:   Height as of this encounter: 5\' 10"  (1.778 m).   Weight as of this encounter: 107.502 kg (237 lb). Advance diet Up with therapy Discharge to SNF when bed available   Positive volume overload with elevated BP's.  Will give one dose of IV lasix and then resume his normal PO lasix later today.  He is back on his BP meds and will put parameters on them as needed. Cardura Lasix Lopressor Diovan  DVT Prophylaxis - Lovenox and Coumadin, ASA 81 mg on hold Weight-Bearing as tolerated to right leg No vaccines. D/C O2 and Pulse OX and try on Room Air  Take Coumadin for three weeks for postoperative protocol and then the patient may resume their previous Coumadin home regimen.  The dose may need to be adjusted based upon the INR.  Please follow the INR and titrate Coumadin dose for a therapeutic range between 2.0 and 3.0 INR.  After completing the three weeks of Coumadin, the patient may resume their previous Coumadin home regimen.  Continue Lovenox injections until the INR is therapeutic at or greater than 2.0.  When INR reaches the therapeutic level of equal to or greater than 2.0, the patient may discontinue the Lovenox injections.  PERKINS, ALEXZANDREW 01/18/2013, 8:10 AM

## 2013-01-18 NOTE — Progress Notes (Signed)
ANTICOAGULATION CONSULT NOTE - Follow up Consult  Pharmacy Consult for:  Warfarin Indication:   Atrial fibrillation and VTE prophylaxis s/p right TKA   Allergies  Allergen Reactions  . Lisinopril Cough  . Mefloquine Other (See Comments)    "made me crazy" anxious, upset    Patient Measurements: Height: 5\' 10"  (177.8 cm) Weight: 237 lb (107.502 kg) IBW/kg (Calculated) : 73   Vital Signs: Temp: 99.8 F (37.7 C) (04/15 0958) Temp src: Oral (04/15 0958) BP: 120/60 mmHg (04/15 0958) Pulse Rate: 60 (04/15 0958)  Labs:  Recent Labs  01/17/13 1225 01/18/13 0430  HGB  --  13.2  HCT  --  38.5*  PLT  --  123*  LABPROT 13.5 13.3  INR 1.04 1.02  CREATININE  --  0.77    Estimated Creatinine Clearance: 96.4 ml/min (by C-G formula based on Cr of 0.77).    Assessment: 61 yoM s/p right total knee arthroplasty 4/14 with history of chronic anticoagulation with warfarin due to atrial fibrillation.  Warfarin was held prior to surgery with last dose on 01/11/13.  Pharmacy is asked to dose warfarin inpatient.  PTA warfarin dose: 5 mg on Monday, Wednesday, Friday, and 7.5 mg all other days.  Lovenox 30 mg subq every 12 hours is ordered until the INR is >/= 1.8.   INR (1.02) remains subtherapeutic as expected after only one dose.  Hgb is decreased (13.2) but remains WNL, Plt (123) decreased.  No bleeding is reported.  Goals of Therapy:   INR 2-3  Monitor platelets by anticoagulation protocol: Yes   Plan:   Warfarin 10 mg tonight  Follow PT/INR daily   Lynann Beaver PharmD, BCPS Pager [phone] 01/18/2013 12:00 PM

## 2013-01-18 NOTE — Progress Notes (Signed)
Physical Therapy Treatment Patient Details Name: Mike Price MRN: [id number] DOB: [birth year] Today's Date: 01/18/2013 Time: 6295-2841 PT Time Calculation (min): 24 min  PT Assessment / Plan / Recommendation Comments on Treatment Session       Follow Up Recommendations  SNF     Does the patient have the potential to tolerate intense rehabilitation     Barriers to Discharge        Equipment Recommendations  None recommended by PT    Recommendations for Other Services OT consult  Frequency 7X/week   Plan Discharge plan remains appropriate    Precautions / Restrictions Precautions Precautions: Fall;Knee Required Braces or Orthoses: Knee Immobilizer - Right Knee Immobilizer - Right: Discontinue once straight leg raise with < 10 degree lag Restrictions Weight Bearing Restrictions: No Other Position/Activity Restrictions: WBAT   Pertinent Vitals/Pain     Mobility  Bed Mobility Bed Mobility: Sit to Supine Sit to Supine: 3: Mod assist Details for Bed Mobility Assistance: cues for sequence and use of L LE to self assist; physical assist with R LE and to bring trunk to upright posture Transfers Transfers: Sit to Stand;Stand to Sit Sit to Stand: 1: +2 Total assist;With upper extremity assist;From elevated surface;From chair/3-in-1;With armrests Sit to Stand: Patient Percentage: 60% Stand to Sit: 1: +2 Total assist;With upper extremity assist;To bed Stand to Sit: Patient Percentage: 70% Details for Transfer Assistance: cues for LE management and use of UEs to self assist Ambulation/Gait Ambulation/Gait Assistance: 1: +2 Total assist Ambulation/Gait: Patient Percentage: 70% Ambulation Distance (Feet): 22 Feet Assistive device: Rolling walker Ambulation/Gait Assistance Details: cues for posture, sequence, position from RW and stride length Gait Pattern: Step-to pattern;Decreased step length - right;Decreased step length - left;Antalgic General Gait Details: Multiple short  standing breaks required to complete task    Exercises     PT Diagnosis:    PT Problem List:   PT Treatment Interventions:     PT Goals Acute Rehab PT Goals PT Goal Formulation: With patient Time For Goal Achievement: 01/26/13 Potential to Achieve Goals: Good Pt will go Supine/Side to Sit: with supervision PT Goal: Supine/Side to Sit - Progress: Goal set today Pt will go Sit to Supine/Side: with supervision PT Goal: Sit to Supine/Side - Progress: Goal set today Pt will go Sit to Stand: with supervision PT Goal: Sit to Stand - Progress: Goal set today Pt will go Stand to Sit: with supervision PT Goal: Stand to Sit - Progress: Goal set today Pt will Ambulate: 51 - 150 feet;with supervision;with rolling walker PT Goal: Ambulate - Progress: Goal set today  Visit Information  Last PT Received On: 01/18/13 Assistance Needed: +2    Subjective Data  Subjective: Am I doing ok? Patient Stated Goal: Rehab and home to resume previous lifestyle with decreased pain   Cognition  Cognition Overall Cognitive Status: Appears within functional limits for tasks assessed/performed Arousal/Alertness: Awake/alert Orientation Level: Appears intact for tasks assessed Behavior During Session: [facility] for tasks performed    Balance     End of Session PT - End of Session Equipment Utilized During Treatment: Gait belt;Right knee immobilizer Activity Tolerance: Patient tolerated treatment well Patient left: in bed;with call bell/phone within reach Nurse Communication: Mobility status   GP     Mike Price 01/18/2013, 2:48 PM

## 2013-01-18 NOTE — Progress Notes (Signed)
OT Cancellation Note  Patient Details Name: Mike Price MRN: [id number] DOB: January 14, 1935   Cancelled Treatment:     Pt plans STSNF for rehab.  Will defer OT to that venue.    [name] 01/18/2013, 12:56 PM Marica Otter, OTR/L [phone] 01/18/2013

## 2013-01-19 ENCOUNTER — Encounter (HOSPITAL_COMMUNITY): Payer: Self-pay | Admitting: Orthopedic Surgery

## 2013-01-19 LAB — BASIC METABOLIC PANEL
BUN: 15 mg/dL (ref 6–23)
CO2: 29 mEq/L (ref 19–32)
Calcium: 9.1 mg/dL (ref 8.4–10.5)
GFR calc non Af Amer: 86 mL/min — ABNORMAL LOW (ref >90)
Glucose, Bld: 168 mg/dL — ABNORMAL HIGH (ref 70–99)

## 2013-01-19 LAB — CBC
HCT: 33.7 % — ABNORMAL LOW (ref 39.0–52.0)
Hemoglobin: 11.6 g/dL — ABNORMAL LOW (ref 13.0–17.0)
MCH: 34.9 pg — ABNORMAL HIGH (ref 26.0–34.0)
MCHC: 34.4 g/dL (ref 30.0–36.0)
MCV: 101.5 fL — ABNORMAL HIGH (ref 78.0–100.0)
RBC: 3.32 MIL/uL — ABNORMAL LOW (ref 4.22–5.81)

## 2013-01-19 MED ORDER — CLONAZEPAM 0.5 MG PO TABS
0.2500 mg | ORAL_TABLET | Freq: Two times a day (BID) | ORAL | Status: DC | PRN
  Administered 2013-01-19 – 2013-01-20 (×3): 0.25 mg via ORAL
  Filled 2013-01-19 (×2): qty 1

## 2013-01-19 MED ORDER — OMEPRAZOLE 20 MG PO CPDR
20.0000 mg | DELAYED_RELEASE_CAPSULE | Freq: Two times a day (BID) | ORAL | Status: DC
  Administered 2013-01-19 – 2013-01-20 (×2): 20 mg via ORAL
  Filled 2013-01-19 (×4): qty 1

## 2013-01-19 MED ORDER — ATORVASTATIN CALCIUM 40 MG PO TABS
40.0000 mg | ORAL_TABLET | Freq: Every day | ORAL | Status: DC
  Administered 2013-01-19: 40 mg via ORAL
  Filled 2013-01-19 (×2): qty 1

## 2013-01-19 MED ORDER — EZETIMIBE 10 MG PO TABS
10.0000 mg | ORAL_TABLET | Freq: Every day | ORAL | Status: DC
  Administered 2013-01-19: 10 mg via ORAL
  Filled 2013-01-19 (×2): qty 1

## 2013-01-19 MED ORDER — WARFARIN SODIUM 7.5 MG PO TABS
7.5000 mg | ORAL_TABLET | Freq: Once | ORAL | Status: AC
  Administered 2013-01-19: 7.5 mg via ORAL
  Filled 2013-01-19: qty 1

## 2013-01-19 MED ORDER — METOPROLOL TARTRATE 50 MG PO TABS
50.0000 mg | ORAL_TABLET | Freq: Two times a day (BID) | ORAL | Status: DC
  Administered 2013-01-19 – 2013-01-20 (×2): 50 mg via ORAL
  Filled 2013-01-19 (×4): qty 1

## 2013-01-19 NOTE — Progress Notes (Signed)
Physical Therapy Treatment Patient Details Name: Mike Price MRN: [id number] DOB: 1934-12-09 Today's Date: 01/19/2013 Time: 9147-8295 PT Time Calculation (min): 44 min  PT Assessment / Plan / Recommendation Comments on Treatment Session       Follow Up Recommendations  SNF     Does the patient have the potential to tolerate intense rehabilitation     Barriers to Discharge        Equipment Recommendations  None recommended by PT    Recommendations for Other Services OT consult  Frequency 7X/week   Plan Discharge plan remains appropriate    Precautions / Restrictions Precautions Precautions: Fall;Knee Required Braces or Orthoses: Knee Immobilizer - Right Knee Immobilizer - Right: Discontinue once straight leg raise with < 10 degree lag Restrictions Weight Bearing Restrictions: No Other Position/Activity Restrictions: WBAT   Pertinent Vitals/Pain 4/10; premed, ice packs provided    Mobility  Bed Mobility Bed Mobility: Sit to Supine Supine to Sit: 4: Min assist;3: Mod assist Details for Bed Mobility Assistance: cues for sequence and use of L LE to self assist; physical assist with R LE and to bring trunk to upright posture Transfers Transfers: Sit to Stand;Stand to Sit Sit to Stand: 3: Mod assist Stand to Sit: 3: Mod assist Details for Transfer Assistance: cues for LE management and use of UEs to self assist Ambulation/Gait Ambulation/Gait Assistance: 3: Mod assist;4: Min assist Ambulation Distance (Feet): 62 Feet Assistive device: Rolling walker Ambulation/Gait Assistance Details: cues for posture, sequence, position from RW and stride length Gait Pattern: Step-to pattern;Decreased step length - right;Decreased step length - left;Antalgic General Gait Details: Multiple short standing breaks required to complete task    Exercises Total Joint Exercises Ankle Circles/Pumps: AROM;Supine;Both;20 reps Quad Sets: AROM;Supine;Both;20 reps Heel Slides:  AAROM;Right;Supine;20 reps Straight Leg Raises: AAROM;Supine;Right;20 reps   PT Diagnosis:    PT Problem List:   PT Treatment Interventions:     PT Goals Acute Rehab PT Goals PT Goal Formulation: With patient Time For Goal Achievement: 01/26/13 Potential to Achieve Goals: Good Pt will go Supine/Side to Sit: with supervision PT Goal: Supine/Side to Sit - Progress: Progressing toward goal Pt will go Sit to Supine/Side: with supervision PT Goal: Sit to Supine/Side - Progress: Progressing toward goal Pt will go Sit to Stand: with supervision PT Goal: Sit to Stand - Progress: Progressing toward goal Pt will go Stand to Sit: with supervision PT Goal: Stand to Sit - Progress: Progressing toward goal Pt will Ambulate: 51 - 150 feet;with supervision;with rolling walker PT Goal: Ambulate - Progress: Progressing toward goal  Visit Information  Last PT Received On: 01/19/13 Assistance Needed: +2    Subjective Data  Subjective: I am doing much better than yesterday Patient Stated Goal: Rehab and home to resume previous lifestyle with decreased pain   Cognition  Cognition Arousal/Alertness: Awake/alert Overall Cognitive Status: Within Functional Limits for tasks assessed    Balance     End of Session PT - End of Session Equipment Utilized During Treatment: Gait belt;Right knee immobilizer Activity Tolerance: Patient tolerated treatment well Patient left: in chair;with call bell/phone within reach;with family/visitor present Nurse Communication: Mobility status   GP     Mike Price 01/19/2013, 12:58 PM

## 2013-01-19 NOTE — Progress Notes (Signed)
Subjective: 2 Days Post-Op Procedure(s) (LRB): RIGHT TOTAL KNEE ARTHROPLASTY (Right) Patient reports pain as mild.   Patient seen in rounds for Dr. Lequita Halt. Patient is well, but has had some minor complaints of pain in the knee, requiring pain medications Plan is to go Skilled nursing facility after hospital stay. Wants to look into [place].   Objective: Vital signs in last 24 hours: Temp:  [97.8 F (36.6 C)-99.4 F (37.4 C)] 97.8 F (36.6 C) (04/16 1409) Pulse Rate:  [61-109] 109 (04/16 1409) Resp:  [16] 16 (04/16 0538) BP: (124-150)/(60-90) 150/90 mmHg (04/16 1409) SpO2:  [90 %-99 %] 99 % (04/16 1409)  Intake/Output from previous day:  Intake/Output Summary (Last 24 hours) at 01/19/13 1443 Last data filed at 01/19/13 1411  Gross per 24 hour  Intake   1400 ml  Output   3875 ml  Net  -2475 ml    Intake/Output this shift: Total I/O In: 480 [P.O.:480] Out: 600 [Urine:600]  Labs:  Recent Labs  01/18/13 0430 01/19/13 0435  HGB 13.2 11.6*    Recent Labs  01/18/13 0430 01/19/13 0435  WBC 8.6 14.1*  RBC 3.76* 3.32*  HCT 38.5* 33.7*  PLT 123* 135*    Recent Labs  01/18/13 0430 01/19/13 0435  NA 134* 136  K 4.5 4.2  CL 97 100  CO2 28 29  BUN 15 15  CREATININE 0.77 0.77  GLUCOSE 108* 168*  CALCIUM 8.9 9.1    Recent Labs  01/18/13 0430 01/19/13 0435  INR 1.02 1.29    EXAM General - Patient is Alert, Appropriate and Oriented Extremity - Neurovascular intact Sensation intact distally Dorsiflexion/Plantar flexion intact No cellulitis present Dressing/Incision - clean, dry, no drainage, healing Motor Function - intact, moving foot and toes well on exam.   Past Medical History  Diagnosis Date  . Hypertension   . Thyroid disease   . TMJ tenderness 01-11-13    right side-has issues from time to time.  . Pacemaker 01-11-13    3'08  . Dysrhythmia 01-11-13     hx. A. Fib-Pacemaker implanted left chest  . Hypothyroidism   . Anxiety 01-11-13   tx. Clonazepam.  . Edema extremities 01-11-13    retains fluid in legs-right greater than left.  . Neuromuscular disorder     neuropathy in feet  . GERD (gastroesophageal reflux disease)   . Arthritis     Osteoarthritis-knees, fingers  . Anemia     Assessment/Plan: 2 Days Post-Op Procedure(s) (LRB): RIGHT TOTAL KNEE ARTHROPLASTY (Right) Principal Problem:   OA (osteoarthritis) of knee Active Problems:   Postop Hyponatremia  Estimated body mass index is 34.01 kg/(m^2) as calculated from the following:   Height as of this encounter: 5\' 10"  (1.778 m).   Weight as of this encounter: 107.502 kg (237 lb). Up with therapy Plan for discharge tomorrow Discharge to SNF - Wants to look into [place].   DVT Prophylaxis - Lovenox and Coumadin, ASA 81 mg on hold Weight-Bearing as tolerated to right leg  Take Coumadin for three weeks for postoperative protocol and then the patient may resume their previous Coumadin home regimen. The dose may need to be adjusted based upon the INR. Please follow the INR and titrate Coumadin dose for a therapeutic range between 2.0 and 3.0 INR. After completing the three weeks of Coumadin, the patient may resume their previous Coumadin home regimen.   Continue Lovenox injections until the INR is therapeutic at or greater than 2.0. When INR reaches the therapeutic level  of equal to or greater than 2.0, the patient may discontinue the Lovenox injections.  Patrica Duel 01/19/2013, 2:43 PM

## 2013-01-19 NOTE — Progress Notes (Signed)
Physical Therapy Treatment Patient Details Name: Mike Price MRN: [id number] DOB: [birth year] Today's Date: 01/19/2013 Time: 7829-5621 PT Time Calculation (min): 50 min  PT Assessment / Plan / Recommendation Comments on Treatment Session       Follow Up Recommendations  SNF     Does the patient have the potential to tolerate intense rehabilitation     Barriers to Discharge        Equipment Recommendations  None recommended by PT    Recommendations for Other Services OT consult  Frequency 7X/week   Plan Discharge plan remains appropriate    Precautions / Restrictions Precautions Precautions: Fall;Knee Required Braces or Orthoses: Knee Immobilizer - Right Knee Immobilizer - Right: Discontinue once straight leg raise with < 10 degree lag Restrictions Weight Bearing Restrictions: No Other Position/Activity Restrictions: WBAT   Pertinent Vitals/Pain     Mobility  Bed Mobility Bed Mobility: Sit to Supine;Supine to Sit Supine to Sit: 4: Min assist Sit to Supine: 4: Min assist Details for Bed Mobility Assistance: cues for sequence and use of L LE to self assist; physical assist with R LE and to bring trunk to upright posture Transfers Transfers: Sit to Stand;Stand to Sit Sit to Stand: 3: Mod assist Stand to Sit: 4: Min assist;3: Mod assist Details for Transfer Assistance: cues for LE management and use of UEs to self assist Ambulation/Gait Ambulation/Gait Assistance: 4: Min assist;3: Mod assist Ambulation Distance (Feet): 100 Feet (twice) Assistive device: Rolling walker Ambulation/Gait Assistance Details: cues for posture, stride length, position from RW and sequence Gait Pattern: Step-to pattern;Decreased step length - right;Decreased step length - left;Antalgic General Gait Details: Multiple short standing breaks required to complete task    Exercises     PT Diagnosis:    PT Problem List:   PT Treatment Interventions:     PT Goals Acute Rehab PT Goals PT  Goal Formulation: With patient Time For Goal Achievement: 01/26/13 Potential to Achieve Goals: Good Pt will go Supine/Side to Sit: with supervision PT Goal: Supine/Side to Sit - Progress: Progressing toward goal Pt will go Sit to Supine/Side: with supervision PT Goal: Sit to Supine/Side - Progress: Progressing toward goal Pt will go Sit to Stand: with supervision PT Goal: Sit to Stand - Progress: Progressing toward goal Pt will go Stand to Sit: with supervision PT Goal: Stand to Sit - Progress: Progressing toward goal Pt will Ambulate: 51 - 150 feet;with supervision;with rolling walker PT Goal: Ambulate - Progress: Progressing toward goal  Visit Information  Last PT Received On: 01/19/13 Assistance Needed: +1    Subjective Data  Subjective: I want to make it to the nurses station Patient Stated Goal: Rehab and home to resume previous lifestyle with decreased pain   Cognition  Cognition Arousal/Alertness: Awake/alert Behavior During Therapy: WFL for tasks assessed/performed Overall Cognitive Status: Within Functional Limits for tasks assessed    Balance     End of Session PT - End of Session Equipment Utilized During Treatment: Gait belt;Right knee immobilizer Activity Tolerance: Patient tolerated treatment well Patient left: in bed;with call bell/phone within reach Nurse Communication: Mobility status   GP     Mike Price 01/19/2013, 3:58 PM

## 2013-01-19 NOTE — Progress Notes (Signed)
ANTICOAGULATION CONSULT NOTE - Follow up Consult  Pharmacy Consult for:  Warfarin Indication:   Atrial fibrillation and VTE prophylaxis s/p right TKA   Allergies  Allergen Reactions  . Lisinopril Cough  . Mefloquine Other (See Comments)    "made me crazy" anxious, upset    Patient Measurements: Height: 5\' 10"  (177.8 cm) Weight: 237 lb (107.502 kg) IBW/kg (Calculated) : 73   Vital Signs: Temp: 98.2 F (36.8 C) (04/16 0538) BP: 124/67 mmHg (04/16 0538) Pulse Rate: 105 (04/16 0538)  Labs:  Recent Labs  01/17/13 1225 01/18/13 0430 01/19/13 0435  HGB  --  13.2 11.6*  HCT  --  38.5* 33.7*  PLT  --  123* 135*  LABPROT 13.5 13.3 15.8*  INR 1.04 1.02 1.29  CREATININE  --  0.77 0.77    Estimated Creatinine Clearance: 96.4 ml/min (by C-G formula based on Cr of 0.77).    Assessment: 48 yoM s/p right total knee arthroplasty 4/14 with history of chronic anticoagulation with warfarin due to atrial fibrillation.  Warfarin was held prior to surgery with last dose on 01/11/13.  Pharmacy is asked to dose warfarin inpatient.  PTA warfarin dose: 5 mg on Monday, Wednesday, Friday, and 7.5 mg all other days.  Lovenox 30 mg subq every 12 hours is ordered until the INR is >/= 1.8.   INR starting to rise now.  Hgb trended down from yesterday. Platelets are better. On home aspirin 81mg . No bleeding is reported.  Tolerating diet.  Goals of Therapy:   INR 2-3  Monitor platelets by anticoagulation protocol: Yes   Plan:   Warfarin 7.5mg  tonight.  Cont Lovenox 30mg  SQ q12h.  Follow PT/INR daily.  Charolotte Eke, PharmD, pager [phone]. 01/19/2013,8:50 AM.

## 2013-01-19 NOTE — Care Management Note (Signed)
    Page 1 of 1   01/19/2013     6:09:53 PM   CARE MANAGEMENT NOTE 01/19/2013  Patient:  Mike Price, Mike Price   Account Number:  [id number]  Date Initiated:  01/19/2013  Documentation initiated by:  Colleen Can  Subjective/Objective Assessment:   dx osteoarthritis right knee; total knee replacemnt     Action/Plan:   Plans SNF rehab   Anticipated DC Date:  01/20/2013   Anticipated DC Plan:  SKILLED NURSING FACILITY  In-house referral  Clinical Social Worker      DC Planning Services  CM consult      Choice offered to / List presented to:             Status of service:  Completed, signed off Medicare Important Message given?  NA - LOS <3 / Initial given by admissions (If response is "NO", the following Medicare IM given date fields will be blank) Date Medicare IM given:   Date Additional Medicare IM given:    Discharge Disposition:    Per UR Regulation:    If discussed at Long Length of Stay Meetings, dates discussed:    Comments:

## 2013-01-20 DIAGNOSIS — D62 Acute posthemorrhagic anemia: Secondary | ICD-10-CM

## 2013-01-20 LAB — CBC
MCHC: 34.4 g/dL (ref 30.0–36.0)
Platelets: 151 10*3/uL (ref 150–400)
RDW: 13.1 % (ref 11.5–15.5)
WBC: 13.9 10*3/uL — ABNORMAL HIGH (ref 4.0–10.5)

## 2013-01-20 LAB — PROTIME-INR
INR: 1.78 — ABNORMAL HIGH (ref 0.00–1.49)
Prothrombin Time: 20.1 seconds — ABNORMAL HIGH (ref 11.6–15.2)

## 2013-01-20 MED ORDER — METHOCARBAMOL 500 MG PO TABS: 500.0000 mg | ORAL_TABLET | Freq: Four times a day (QID) | ORAL | Status: DC | PRN

## 2013-01-20 MED ORDER — BISACODYL 10 MG RE SUPP: 10.0000 mg | Freq: Every day | RECTAL | Status: DC | PRN

## 2013-01-20 MED ORDER — POLYETHYLENE GLYCOL 3350 17 G PO PACK: 17.0000 g | PACK | Freq: Every day | ORAL | Status: DC

## 2013-01-20 MED ORDER — METOCLOPRAMIDE HCL 5 MG PO TABS: 5.0000 mg | ORAL_TABLET | Freq: Three times a day (TID) | ORAL | Status: DC | PRN

## 2013-01-20 MED ORDER — OXYCODONE HCL 5 MG PO TABS: 5.0000 mg | ORAL_TABLET | ORAL | Status: DC | PRN

## 2013-01-20 MED ORDER — ENOXAPARIN SODIUM 30 MG/0.3ML ~~LOC~~ SOLN: 30.0000 mg | Freq: Two times a day (BID) | SUBCUTANEOUS | Status: DC

## 2013-01-20 MED ORDER — DSS 100 MG PO CAPS: 100.0000 mg | ORAL_CAPSULE | Freq: Two times a day (BID) | ORAL | Status: DC

## 2013-01-20 MED ORDER — ONDANSETRON HCL 4 MG PO TABS: 4.0000 mg | ORAL_TABLET | Freq: Four times a day (QID) | ORAL | Status: DC | PRN

## 2013-01-20 MED ORDER — DIPHENHYDRAMINE HCL 12.5 MG/5ML PO ELIX: 12.5000 mg | ORAL_SOLUTION | ORAL | Status: DC | PRN

## 2013-01-20 MED ORDER — ACETAMINOPHEN 325 MG PO TABS: 650.0000 mg | ORAL_TABLET | Freq: Four times a day (QID) | ORAL | Status: DC | PRN

## 2013-01-20 MED ORDER — WARFARIN SODIUM 5 MG PO TABS: 5.0000 mg | ORAL_TABLET | Freq: Every day | ORAL | Status: DC

## 2013-01-20 NOTE — Progress Notes (Signed)
Clinical Social Work Department CLINICAL SOCIAL WORK PLACEMENT NOTE 01/20/2013  Patient:  Mike Price, Mike Price  Account Number:  [id number] Admit date:  01/17/2013  Clinical Social Worker:  Cori Razor, LCSW  Date/time:  01/18/2013 02:24 PM  Clinical Social Work is seeking post-discharge placement for this patient at the following level of care:   SKILLED NURSING   (*CSW will update this form in Epic as items are completed)     Patient/family provided with [facility] Department of Clinical Social Work's list of facilities offering this level of care within the geographic area requested by the patient (or if unable, by the patient's family).  01/18/2013  Patient/family informed of their freedom to choose among providers that offer the needed level of care, that participate in Medicare, Medicaid or managed care program needed by the patient, have an available bed and are willing to accept the patient.    Patient/family informed of [facility]' ownership interest in [facility], as well as of the fact that they are under no obligation to receive care at this facility.  PASARR submitted to [employer] on 01/18/2013 PASARR number received from [employer] on 01/18/2013  FL2 transmitted to all facilities in geographic area requested by pt/family on  01/18/2013 FL2 transmitted to all facilities within larger geographic area on   Patient informed that his/her managed care company has contracts with or will negotiate with  certain facilities, including the following:     Patient/family informed of bed offers received:  01/18/2013 Patient chooses bed at [facility] Physician recommends and patient chooses bed at    Patient to be transferred to [facility] on  01/20/2013 Patient to be transferred to facility by [facility]  The following physician request were entered in Epic:   Additional Comments:  Cori Razor LCSW [phone]

## 2013-01-20 NOTE — Discharge Summary (Signed)
Physician Discharge Summary   Patient ID: Mike Price MRN: [id number] DOB/AGE: Jul 19, 1935 78 y.o.  Admit date: 01/17/2013 Discharge date: 01/20/2013  Primary Diagnosis:  Osteoarthritis Right knee  Admission Diagnoses:  Past Medical History  Diagnosis Date  . Hypertension   . Thyroid disease   . TMJ tenderness 01-11-13    right side-has issues from time to time.  . Pacemaker 01-11-13    3'08  . Dysrhythmia 01-11-13     hx. A. Fib-Pacemaker implanted left chest  . Hypothyroidism   . Anxiety 01-11-13    tx. Clonazepam.  . Edema extremities 01-11-13    retains fluid in legs-right greater than left.  . Neuromuscular disorder     neuropathy in feet  . GERD (gastroesophageal reflux disease)   . Arthritis     Osteoarthritis-knees, fingers  . Anemia    Discharge Diagnoses:   Principal Problem:   OA (osteoarthritis) of knee Active Problems:   Postop Hyponatremia   Postoperative anemia due to acute blood loss  Estimated body mass index is 34.01 kg/(m^2) as calculated from the following:   Height as of this encounter: 5\' 10"  (1.778 m).   Weight as of this encounter: 107.502 kg (237 lb).  Procedure:  Procedure(s) (LRB): RIGHT TOTAL KNEE ARTHROPLASTY (Right)   Consults: None  HPI: Mike Price is a 78 y.o. year old male with end stage OA of his right knee with progressively worsening pain and dysfunction. He has constant pain, with activity and at rest and significant functional deficits with difficulties even with ADLs. He has had extensive non-op management including analgesics, injections of cortisone and viscosupplements, and home exercise program, but remains in significant pain with significant dysfunction. Radiographs show bone on bone arthritis medial and patellofemoral. He presents now for right Total Knee Arthroplasty.   Laboratory Data: Admission on 01/17/2013  Component Date Value Range Status  . Prothrombin Time 01/17/2013 13.5  11.6 - 15.2 seconds Final  . INR  01/17/2013 1.04  0.00 - 1.49 Final  . WBC 01/18/2013 8.6  4.0 - 10.5 K/uL Final  . RBC 01/18/2013 3.76* 4.22 - 5.81 MIL/uL Final  . Hemoglobin 01/18/2013 13.2  13.0 - 17.0 g/dL Final  . HCT 78/29/5621 38.5* 39.0 - 52.0 % Final  . MCV 01/18/2013 102.4* 78.0 - 100.0 fL Final  . MCH 01/18/2013 35.1* 26.0 - 34.0 pg Final  . MCHC 01/18/2013 34.3  30.0 - 36.0 g/dL Final  . RDW 30/86/5784 13.0  11.5 - 15.5 % Final  . Platelets 01/18/2013 123* 150 - 400 K/uL Final  . Sodium 01/18/2013 134* 135 - 145 mEq/L Final  . Potassium 01/18/2013 4.5  3.5 - 5.1 mEq/L Final  . Chloride 01/18/2013 97  96 - 112 mEq/L Final  . CO2 01/18/2013 28  19 - 32 mEq/L Final  . Glucose, Bld 01/18/2013 108* 70 - 99 mg/dL Final  . BUN 69/62/9528 15  6 - 23 mg/dL Final  . Creatinine, Ser 01/18/2013 0.77  0.50 - 1.35 mg/dL Final  . Calcium 41/32/4401 8.9  8.4 - 10.5 mg/dL Final  . GFR calc non Af Amer 01/18/2013 86* >90 mL/min Final  . GFR calc Af Amer 01/18/2013 >90  >90 mL/min Final   Comment:                                 The eGFR has been calculated  using the CKD EPI equation.                          This calculation has not been                          validated in all clinical                          situations.                          eGFR's persistently                          <90 mL/min signify                          possible Chronic Kidney Disease.  [name] Kitchen Prothrombin Time 01/18/2013 13.3  11.6 - 15.2 seconds Final  . INR 01/18/2013 1.02  0.00 - 1.49 Final  . WBC 01/19/2013 14.1* 4.0 - 10.5 K/uL Final  . RBC 01/19/2013 3.32* 4.22 - 5.81 MIL/uL Final  . Hemoglobin 01/19/2013 11.6* 13.0 - 17.0 g/dL Final  . HCT 16/07/9603 33.7* 39.0 - 52.0 % Final  . MCV 01/19/2013 101.5* 78.0 - 100.0 fL Final  . MCH 01/19/2013 34.9* 26.0 - 34.0 pg Final  . MCHC 01/19/2013 34.4  30.0 - 36.0 g/dL Final  . RDW 54/06/8118 12.7  11.5 - 15.5 % Final  . Platelets 01/19/2013 135* 150 - 400 K/uL Final    . Sodium 01/19/2013 136  135 - 145 mEq/L Final  . Potassium 01/19/2013 4.2  3.5 - 5.1 mEq/L Final  . Chloride 01/19/2013 100  96 - 112 mEq/L Final  . CO2 01/19/2013 29  19 - 32 mEq/L Final  . Glucose, Bld 01/19/2013 168* 70 - 99 mg/dL Final  . BUN 14/78/2956 15  6 - 23 mg/dL Final  . Creatinine, Ser 01/19/2013 0.77  0.50 - 1.35 mg/dL Final  . Calcium 21/30/8657 9.1  8.4 - 10.5 mg/dL Final  . GFR calc non Af Amer 01/19/2013 86* >90 mL/min Final  . GFR calc Af Amer 01/19/2013 >90  >90 mL/min Final   Comment:                                 The eGFR has been calculated                          using the CKD EPI equation.                          This calculation has not been                          validated in all clinical                          situations.                          eGFR's persistently                          <  90 mL/min signify                          possible Chronic Kidney Disease.  [name] Kitchen Prothrombin Time 01/19/2013 15.8* 11.6 - 15.2 seconds Final  . INR 01/19/2013 1.29  0.00 - 1.49 Final  . WBC 01/20/2013 13.9* 4.0 - 10.5 K/uL Final  . RBC 01/20/2013 3.22* 4.22 - 5.81 MIL/uL Final  . Hemoglobin 01/20/2013 11.4* 13.0 - 17.0 g/dL Final  . HCT 74/25/9563 33.1* 39.0 - 52.0 % Final  . MCV 01/20/2013 102.8* 78.0 - 100.0 fL Final  . MCH 01/20/2013 35.4* 26.0 - 34.0 pg Final  . MCHC 01/20/2013 34.4  30.0 - 36.0 g/dL Final  . RDW 87/56/4332 13.1  11.5 - 15.5 % Final  . Platelets 01/20/2013 151  150 - 400 K/uL Final  . Prothrombin Time 01/20/2013 20.1* 11.6 - 15.2 seconds Final  . INR 01/20/2013 1.78* 0.00 - 1.49 Final  Hospital Outpatient Visit on 01/11/2013  Component Date Value Range Status  . MRSA, PCR 01/11/2013 NEGATIVE  NEGATIVE Final  . Staphylococcus aureus 01/11/2013 NEGATIVE  NEGATIVE Final   Comment:                                 The Xpert SA Assay (FDA                          approved for NASAL specimens                          in patients over 46  years of age),                          is one component of                          a comprehensive surveillance                          program.  Test performance has                          been validated by [employer] for patients greater                          than or equal to 74 year old.                          It is not intended                          to diagnose infection nor to                          guide or monitor treatment.  [name] Kitchen aPTT 01/11/2013 39* 24 - 37 seconds Final   Comment:  IF BASELINE aPTT IS ELEVATED,                          SUGGEST PATIENT RISK ASSESSMENT                          BE USED TO DETERMINE APPROPRIATE                          ANTICOAGULANT THERAPY.  . WBC 01/11/2013 7.5  4.0 - 10.5 K/uL Final  . RBC 01/11/2013 4.03* 4.22 - 5.81 MIL/uL Final  . Hemoglobin 01/11/2013 14.1  13.0 - 17.0 g/dL Final  . HCT 84/13/2440 41.5  39.0 - 52.0 % Final  . MCV 01/11/2013 103.0* 78.0 - 100.0 fL Final  . MCH 01/11/2013 35.0* 26.0 - 34.0 pg Final  . MCHC 01/11/2013 34.0  30.0 - 36.0 g/dL Final  . RDW 08/02/2535 13.0  11.5 - 15.5 % Final  . Platelets 01/11/2013 189  150 - 400 K/uL Final  . Sodium 01/11/2013 141  135 - 145 mEq/L Final  . Potassium 01/11/2013 5.0  3.5 - 5.1 mEq/L Final  . Chloride 01/11/2013 103  96 - 112 mEq/L Final  . CO2 01/11/2013 33* 19 - 32 mEq/L Final  . Glucose, Bld 01/11/2013 110* 70 - 99 mg/dL Final  . BUN 64/40/3474 24* 6 - 23 mg/dL Final  . Creatinine, Ser 01/11/2013 0.98  0.50 - 1.35 mg/dL Final  . Calcium 25/95/6387 9.7  8.4 - 10.5 mg/dL Final  . Total Protein 01/11/2013 7.1  6.0 - 8.3 g/dL Final  . Albumin 56/43/3295 3.7  3.5 - 5.2 g/dL Final  . AST 18/84/1660 21  0 - 37 U/L Final  . ALT 01/11/2013 16  0 - 53 U/L Final  . Alkaline Phosphatase 01/11/2013 77  39 - 117 U/L Final  . Total Bilirubin 01/11/2013 1.3* 0.3 - 1.2 mg/dL Final  . GFR calc non Af Amer 01/11/2013 78*  >90 mL/min Final  . GFR calc Af Amer 01/11/2013 >90  >90 mL/min Final   Comment:                                 The eGFR has been calculated                          using the CKD EPI equation.                          This calculation has not been                          validated in all clinical                          situations.                          eGFR's persistently                          <90 mL/min signify  possible Chronic Kidney Disease.  [name] Kitchen Prothrombin Time 01/11/2013 20.5* 11.6 - 15.2 seconds Final  . INR 01/11/2013 1.83* 0.00 - 1.49 Final  . ABO/RH(D) 01/11/2013 O POS   Final  . Antibody Screen 01/11/2013 NEG   Final  . Sample Expiration 01/11/2013 01/20/2013   Final  . Color, Urine 01/11/2013 YELLOW  YELLOW Final  . APPearance 01/11/2013 CLEAR  CLEAR Final  . Specific Gravity, Urine 01/11/2013 1.030  1.005 - 1.030 Final  . pH 01/11/2013 6.5  5.0 - 8.0 Final  . Glucose, UA 01/11/2013 NEGATIVE  NEGATIVE mg/dL Final  . Hgb urine dipstick 01/11/2013 NEGATIVE  NEGATIVE Final  . Bilirubin Urine 01/11/2013 NEGATIVE  NEGATIVE Final  . Ketones, ur 01/11/2013 NEGATIVE  NEGATIVE mg/dL Final  . Protein, ur 16/07/9603 NEGATIVE  NEGATIVE mg/dL Final  . Urobilinogen, UA 01/11/2013 1.0  0.0 - 1.0 mg/dL Final  . Nitrite 54/06/8118 NEGATIVE  NEGATIVE Final  . Leukocytes, UA 01/11/2013 NEGATIVE  NEGATIVE Final   MICROSCOPIC NOT DONE ON URINES WITH NEGATIVE PROTEIN, BLOOD, LEUKOCYTES, NITRITE, OR GLUCOSE <1000 mg/dL.  Admission on 12/17/2012, Discharged on 12/17/2012  Component Date Value Range Status  . WBC 12/17/2012 7.6  4.0 - 10.5 K/uL Final  . RBC 12/17/2012 3.89* 4.22 - 5.81 MIL/uL Final  . Hemoglobin 12/17/2012 13.6  13.0 - 17.0 g/dL Final  . HCT 14/78/2956 39.9  39.0 - 52.0 % Final  . MCV 12/17/2012 102.6* 78.0 - 100.0 fL Final  . MCH 12/17/2012 35.0* 26.0 - 34.0 pg Final  . MCHC 12/17/2012 34.1  30.0 - 36.0 g/dL Final  . RDW 21/30/8657 13.4   11.5 - 15.5 % Final  . Platelets 12/17/2012 182  150 - 400 K/uL Final  . Neutrophils Relative 12/17/2012 63  43 - 77 % Final  . Neutro Abs 12/17/2012 4.8  1.7 - 7.7 K/uL Final  . Lymphocytes Relative 12/17/2012 24  12 - 46 % Final  . Lymphs Abs 12/17/2012 1.8  0.7 - 4.0 K/uL Final  . Monocytes Relative 12/17/2012 11  3 - 12 % Final  . Monocytes Absolute 12/17/2012 0.8  0.1 - 1.0 K/uL Final  . Eosinophils Relative 12/17/2012 2  0 - 5 % Final  . Eosinophils Absolute 12/17/2012 0.2  0.0 - 0.7 K/uL Final  . Basophils Relative 12/17/2012 0  0 - 1 % Final  . Basophils Absolute 12/17/2012 0.0  0.0 - 0.1 K/uL Final  . Prothrombin Time 12/17/2012 31.0* 11.6 - 15.2 seconds Final  . INR 12/17/2012 3.20* 0.00 - 1.49 Final  . Sodium 12/17/2012 135  135 - 145 mEq/L Final  . Potassium 12/17/2012 4.5  3.5 - 5.1 mEq/L Final  . Chloride 12/17/2012 98  96 - 112 mEq/L Final  . CO2 12/17/2012 31  19 - 32 mEq/L Final  . Glucose, Bld 12/17/2012 114* 70 - 99 mg/dL Final  . BUN 84/69/6295 23  6 - 23 mg/dL Final  . Creatinine, Ser 12/17/2012 0.87  0.50 - 1.35 mg/dL Final  . Calcium 28/41/3244 9.5  8.4 - 10.5 mg/dL Final  . GFR calc non Af Amer 12/17/2012 82* >90 mL/min Final  . GFR calc Af Amer 12/17/2012 >90  >90 mL/min Final   Comment:                                 The eGFR has been calculated  using the CKD EPI equation.                          This calculation has not been                          validated in all clinical                          situations.                          eGFR's persistently                          <90 mL/min signify                          possible Chronic Kidney Disease.     X-Rays:No results found.  EKG: Orders placed during the hospital encounter of 09/03/12  . ED EKG  . ED EKG  . EKG     Hospital Course: Mike Price is a 78 y.o. who was admitted to [facility]. They were brought to the operating room on 01/17/2013 and  underwent Procedure(s): RIGHT TOTAL KNEE ARTHROPLASTY.  Patient tolerated the procedure well and was later transferred to the recovery room and then to the orthopaedic floor for postoperative care.  They were given PO and IV analgesics for pain control following their surgery.  They were given 24 hours of postoperative antibiotics of  Anti-infectives   Start     Dose/Rate Route Frequency Ordered Stop   01/17/13 2200  ceFAZolin (ANCEF) IVPB 2 g/50 mL premix     2 g 100 mL/hr over 30 Minutes Intravenous Every 6 hours 01/17/13 1858 01/18/13 0455   01/17/13 1000  ceFAZolin (ANCEF) IVPB 2 g/50 mL premix     2 g 100 mL/hr over 30 Minutes Intravenous On call to O.R. 01/17/13 4098 01/17/13 1557     and started on DVT prophylaxis in the form of Lovenox and Coumadin.   PT and OT were ordered for total joint protocol.  Discharge planning consulted to help with postop disposition and equipment needs.  Patient had a decent night on the evening of surgery.  Patient wanted to look into [place].  They started to get up OOB with therapy on day one. Hemovac drain was pulled without difficulty.  Continued to work with therapy into day two.  Dressing was changed on day two and the incision was healing well.  By day three, the patient had progressed with therapy and meeting their goals.  Incision was healing well.  Patient was seen in rounds and was ready to go to the SNF, [facility] [place].   Discharge Medications: Prior to Admission medications   Medication Sig Start Date End Date Taking? Authorizing Provider  aspirin 81 MG tablet Take 81 mg by mouth daily.   Yes Historical Provider, MD  atorvastatin (LIPITOR) 40 MG tablet Take 40 mg by mouth every evening.    Yes Historical Provider, MD  clonazePAM (KLONOPIN) 0.5 MG tablet Take 0.25-0.5 mg by mouth 3 (three) times daily as needed. Takes 1/2 tablet twice daily then a whole tablet at bedtime   Yes Historical Provider, MD  doxazosin (CARDURA) 2 MG tablet Take  1 mg by mouth at bedtime.  Yes Historical Provider, MD  ezetimibe (ZETIA) 10 MG tablet Take 10 mg by mouth every evening.    Yes Historical Provider, MD  ferrous sulfate 325 (65 FE) MG tablet Take 325 mg by mouth every other day. On Monday, Wednesday and Friday   Yes Historical Provider, MD  furosemide (LASIX) 20 MG tablet Take 10 mg by mouth 2 (two) times daily.   Yes Historical Provider, MD  levothyroxine (SYNTHROID, LEVOTHROID) 75 MCG tablet Take 75 mcg by mouth daily before breakfast.    Yes Historical Provider, MD  metoprolol (LOPRESSOR) 50 MG tablet Take 50 mg by mouth 2 (two) times daily.   Yes Historical Provider, MD  omeprazole (PRILOSEC) 20 MG capsule Take 20 mg by mouth 2 (two) times daily.   Yes Historical Provider, MD  traMADol (ULTRAM) 50 MG tablet Take 50 mg by mouth every 6 (six) hours as needed for pain.   Yes Historical Provider, MD  valsartan (DIOVAN) 160 MG tablet Take 160 mg by mouth every morning.    Yes Historical Provider, MD  acetaminophen (TYLENOL) 325 MG tablet Take 2 tablets (650 mg total) by mouth every 6 (six) hours as needed. 01/20/13   Darrelle Wiberg Julien Girt, PA-C  bisacodyl (DULCOLAX) 10 MG suppository Place 1 suppository (10 mg total) rectally daily as needed. 01/20/13   Reatha Sur Julien Girt, PA-C  diphenhydrAMINE (BENADRYL) 12.5 MG/5ML elixir Take 5-10 mLs (12.5-25 mg total) by mouth every 4 (four) hours as needed for itching. 01/20/13   Nalah Macioce, PA-C  docusate sodium 100 MG CAPS Take 100 mg by mouth 2 (two) times daily. 01/20/13   Aundray Cartlidge, PA-C  enoxaparin (LOVENOX) 30 MG/0.3ML injection Inject 0.3 mLs (30 mg total) into the skin every 12 (twelve) hours. Continue Lovenox injections until the INR is therapeutic at or greater than 2.0.  When INR reaches the therapeutic level of equal to or greater than 2.0, the patient may discontinue the Lovenox injections. 01/20/13   Antionette Luster Julien Girt, PA-C  methocarbamol (ROBAXIN) 500 MG tablet Take 1 tablet (500  mg total) by mouth every 6 (six) hours as needed. 01/20/13   Burnice Vassel Julien Girt, PA-C  metoCLOPramide (REGLAN) 5 MG tablet Take 1-2 tablets (5-10 mg total) by mouth every 8 (eight) hours as needed (if ondansetron (ZOFRAN) ineffective.). 01/20/13   Yolanda Huffstetler, PA-C  ondansetron (ZOFRAN) 4 MG tablet Take 1 tablet (4 mg total) by mouth every 6 (six) hours as needed for nausea. 01/20/13   Mckennon Zwart, PA-C  oxyCODONE (OXY IR/ROXICODONE) 5 MG immediate release tablet Take 1-2 tablets (5-10 mg total) by mouth every 3 (three) hours as needed. 01/20/13   Sharmel Ballantine, PA-C  polyethylene glycol (MIRALAX / GLYCOLAX) packet Take 17 g by mouth daily. 01/20/13   Dhruva Orndoff Julien Girt, PA-C  warfarin (COUMADIN) 5 MG tablet Take 1-1.5 tablets (5-7.5 mg total) by mouth daily. Coumadin for 3 wks postop, then resume previous home regimen. Adjust dose based upon the INR.  Follow INR, titrate Coumadin dose for a therapeutic range between 2.0 and 3.0 INR.  After completing the 3 weeks of Coumadin, the patient may resume their previous Coumadin home regimen. Home Dose: 7.5 mg 1 and 1/2 tablet every day except on Mon, Wed, and Fri, pt takes 1 tablet for 5 mg dose. 01/20/13   Calab Sachse Julien Girt, PA-C    Diet: Cardiac diet Activity:WBAT Follow-up:in 2 weeks Disposition - Skilled nursing facility - [facility] [place] Discharged Condition: good   Discharge Orders   Future Orders Complete By Expires  Call MD / Call 911  As directed     Comments:      If you experience chest pain or shortness of breath, CALL 911 and be transported to the hospital emergency room.  If you develope a fever above 101 F, pus (white drainage) or increased drainage or redness at the wound, or calf pain, call your surgeon's office.    Change dressing  As directed     Comments:      Change dressing daily with sterile 4 x 4 inch gauze dressing and apply TED hose. Do not submerge the incision under water.    Constipation  Prevention  As directed     Comments:      Drink plenty of fluids.  Prune juice may be helpful.  You may use a stool softener, such as Colace (over the counter) 100 mg twice a day.  Use MiraLax (over the counter) for constipation as needed.    Diet - low sodium heart healthy  As directed     Discharge instructions  As directed     Comments:      Pick up stool softner and laxative for home. Do not submerge incision under water. May shower. Continue to use ice for pain and swelling from surgery.  Take Coumadin for three weeks for postoperative protocol and then the patient may resume their previous Coumadin home regimen.  The dose may need to be adjusted based upon the INR.  Please follow the INR and titrate Coumadin dose for a therapeutic range between 2.0 and 3.0 INR.  After completing the three weeks of Coumadin, the patient may resume their previous Coumadin home regimen.  Continue Lovenox injections until the INR is therapeutic at or greater than 2.0.  When INR reaches the therapeutic level of equal to or greater than 2.0, the patient may discontinue the Lovenox injections.  When discharged from the skilled rehab facility, please have the facility set up the patient's Home Health Physical Therapy prior to being released.  Also provide the patient with their medications at time of release from the facility to include their pain medication, the muscle relaxants, and their blood thinner medication.  If the patient is still at the rehab facility at time of follow up appointment, please also assist the patient in arranging follow up appointment in our office and any transportation needs.    Do not put a pillow under the knee. Place it under the heel.  As directed     Do not sit on low chairs, stoools or toilet seats, as it may be difficult to get up from low surfaces  As directed     Driving restrictions  As directed     Comments:      No driving until released by the physician.    Increase  activity slowly as tolerated  As directed     Lifting restrictions  As directed     Comments:      No lifting until released by the physician.    Patient may shower  As directed     Comments:      You may shower without a dressing once there is no drainage.  Do not wash over the wound.  If drainage remains, do not shower until drainage stops.    TED hose  As directed     Comments:      Use stockings (TED hose) for 3 weeks on both leg(s).  You may remove them at night for sleeping.  Weight bearing as tolerated  As directed         Medication List    STOP taking these medications       multivitamin with minerals Tabs      TAKE these medications       acetaminophen 325 MG tablet  Commonly known as:  TYLENOL  Take 2 tablets (650 mg total) by mouth every 6 (six) hours as needed.     aspirin 81 MG tablet  Take 81 mg by mouth daily.     atorvastatin 40 MG tablet  Commonly known as:  LIPITOR  Take 40 mg by mouth every evening.     bisacodyl 10 MG suppository  Commonly known as:  DULCOLAX  Place 1 suppository (10 mg total) rectally daily as needed.     clonazePAM 0.5 MG tablet  Commonly known as:  KLONOPIN  Take 0.25-0.5 mg by mouth 3 (three) times daily as needed. Takes 1/2 tablet twice daily then a whole tablet at bedtime     diphenhydrAMINE 12.5 MG/5ML elixir  Commonly known as:  BENADRYL  Take 5-10 mLs (12.5-25 mg total) by mouth every 4 (four) hours as needed for itching.     doxazosin 2 MG tablet  Commonly known as:  CARDURA  Take 1 mg by mouth at bedtime.     DSS 100 MG Caps  Take 100 mg by mouth 2 (two) times daily.     enoxaparin 30 MG/0.3ML injection  Commonly known as:  LOVENOX  Inject 0.3 mLs (30 mg total) into the skin every 12 (twelve) hours. Continue Lovenox injections until the INR is therapeutic at or greater than 2.0.  When INR reaches the therapeutic level of equal to or greater than 2.0, the patient may discontinue the Lovenox injections.      ezetimibe 10 MG tablet  Commonly known as:  ZETIA  Take 10 mg by mouth every evening.     ferrous sulfate 325 (65 FE) MG tablet  Take 325 mg by mouth every other day. On Monday, Wednesday and Friday     furosemide 20 MG tablet  Commonly known as:  LASIX  Take 10 mg by mouth 2 (two) times daily.     levothyroxine 75 MCG tablet  Commonly known as:  SYNTHROID, LEVOTHROID  Take 75 mcg by mouth daily before breakfast.     methocarbamol 500 MG tablet  Commonly known as:  ROBAXIN  Take 1 tablet (500 mg total) by mouth every 6 (six) hours as needed.     metoCLOPramide 5 MG tablet  Commonly known as:  REGLAN  Take 1-2 tablets (5-10 mg total) by mouth every 8 (eight) hours as needed (if ondansetron (ZOFRAN) ineffective.).     metoprolol 50 MG tablet  Commonly known as:  LOPRESSOR  Take 50 mg by mouth 2 (two) times daily.     omeprazole 20 MG capsule  Commonly known as:  PRILOSEC  Take 20 mg by mouth 2 (two) times daily.     ondansetron 4 MG tablet  Commonly known as:  ZOFRAN  Take 1 tablet (4 mg total) by mouth every 6 (six) hours as needed for nausea.     oxyCODONE 5 MG immediate release tablet  Commonly known as:  Oxy IR/ROXICODONE  Take 1-2 tablets (5-10 mg total) by mouth every 3 (three) hours as needed.     polyethylene glycol packet  Commonly known as:  MIRALAX / GLYCOLAX  Take 17 g by mouth daily.  traMADol 50 MG tablet  Commonly known as:  ULTRAM  Take 50 mg by mouth every 6 (six) hours as needed for pain.     valsartan 160 MG tablet  Commonly known as:  DIOVAN  Take 160 mg by mouth every morning.     warfarin 5 MG tablet  Commonly known as:  COUMADIN  Take 1-1.5 tablets (5-7.5 mg total) by mouth daily. Coumadin for 3 wks postop, then resume previous home regimen. Adjust dose based upon the INR.  Follow INR, titrate Coumadin dose for a therapeutic range between 2.0 and 3.0 INR.  After completing the 3 weeks of Coumadin, the patient may resume their previous  Coumadin home regimen.  Home Dose: 7.5 mg 1 and 1/2 tablet every day except on Mon, Wed, and Fri, pt takes 1 tablet for 5 mg dose.           Follow-up Information   Follow up with Loanne Drilling, MD. Schedule an appointment as soon as possible for a visit in 2 weeks.   Contact information:   [address] [place] [address] [phone]       Signed: Patrica Duel 01/20/2013, 9:15 AM

## 2013-01-20 NOTE — Progress Notes (Signed)
Physical Therapy Treatment Patient Details Name: Mike Price MRN: [id number] DOB: [birth year] Today's Date: 01/20/2013 Time: 4098-1191 PT Time Calculation (min): 62 min  PT Assessment / Plan / Recommendation Comments on Treatment Session       Follow Up Recommendations  SNF     Does the patient have the potential to tolerate intense rehabilitation     Barriers to Discharge        Equipment Recommendations  None recommended by PT    Recommendations for Other Services OT consult  Frequency 7X/week   Plan Discharge plan remains appropriate    Precautions / Restrictions Precautions Precautions: Fall;Knee Required Braces or Orthoses: Knee Immobilizer - Right Knee Immobilizer - Right: Discontinue once straight leg raise with < 10 degree lag Restrictions Weight Bearing Restrictions: No Other Position/Activity Restrictions: WBAT   Pertinent Vitals/Pain 5-6/10; premed, cold packs provided    Mobility  Bed Mobility Bed Mobility: Sit to Supine;Supine to Sit Supine to Sit: 4: Min assist Sit to Supine: 4: Min assist Details for Bed Mobility Assistance: cues for sequence and use of L LE to self assist; physical assist with R LE and to bring trunk to upright posture Transfers Transfers: Sit to Stand;Stand to Sit Sit to Stand: 4: Min assist;3: Mod assist Stand to Sit: 4: Min assist Details for Transfer Assistance: cues for LE management and use of UEs to self assist Ambulation/Gait Ambulation/Gait Assistance: 4: Min assist Ambulation Distance (Feet): 200 Feet Assistive device: Rolling walker Ambulation/Gait Assistance Details: cues for posture, position from RW and stride length Gait Pattern: Step-to pattern;Decreased step length - right;Decreased step length - left;Antalgic General Gait Details: Multiple short standing breaks required to complete task    Exercises Total Joint Exercises Ankle Circles/Pumps: AROM;Supine;Both;20 reps Quad Sets: AROM;Supine;Both;20 reps Short  Arc QuadBarbaraann Boys;Both;20 reps;Supine Heel Slides: AAROM;Right;Supine;20 reps Straight Leg Raises: AAROM;Supine;Right;20 reps   PT Diagnosis:    PT Problem List:   PT Treatment Interventions:     PT Goals Acute Rehab PT Goals PT Goal Formulation: With patient Time For Goal Achievement: 01/26/13 Potential to Achieve Goals: Good Pt will go Supine/Side to Sit: with supervision PT Goal: Supine/Side to Sit - Progress: Progressing toward goal Pt will go Sit to Supine/Side: with supervision PT Goal: Sit to Supine/Side - Progress: Progressing toward goal Pt will go Sit to Stand: with supervision PT Goal: Sit to Stand - Progress: Progressing toward goal Pt will go Stand to Sit: with supervision PT Goal: Stand to Sit - Progress: Progressing toward goal Pt will Ambulate: 51 - 150 feet;with supervision;with rolling walker PT Goal: Ambulate - Progress: Progressing toward goal  Visit Information  Last PT Received On: 01/20/13 Assistance Needed: +1    Subjective Data  Patient Stated Goal: Rehab and home to resume previous lifestyle with decreased pain   Cognition  Cognition Arousal/Alertness: Awake/alert Behavior During Therapy: WFL for tasks assessed/performed Overall Cognitive Status: Within Functional Limits for tasks assessed    Balance     End of Session PT - End of Session Equipment Utilized During Treatment: Gait belt;Right knee immobilizer Activity Tolerance: Patient tolerated treatment well Patient left: in chair;with call bell/phone within reach Nurse Communication: Mobility status   GP     Kim Oki 01/20/2013, 12:10 PM

## 2013-01-20 NOTE — Progress Notes (Signed)
Subjective: 3 Days Post-Op Procedure(s) (LRB): RIGHT TOTAL KNEE ARTHROPLASTY (Right) Patient reports pain as mild.   Patient seen in rounds by Dr. Lequita Halt. Patient is well, and has had no acute complaints or problems Patient is ready to go to the SNF today. Going to [facility] at [place].  Objective: Vital signs in last 24 hours: Temp:  [97.7 F (36.5 C)-98.5 F (36.9 C)] 97.7 F (36.5 C) (04/17 0512) Pulse Rate:  [103-109] 103 (04/17 0512) Resp:  [16-18] 16 (04/17 0512) BP: (130-150)/(60-90) 143/87 mmHg (04/17 0512) SpO2:  [94 %-99 %] 94 % (04/17 0512)  Intake/Output from previous day:  Intake/Output Summary (Last 24 hours) at 01/20/13 0857 Last data filed at 01/20/13 1610  Gross per 24 hour  Intake    720 ml  Output   1550 ml  Net   -830 ml     Labs:  Recent Labs  01/18/13 0430 01/19/13 0435 01/20/13 0420  HGB 13.2 11.6* 11.4*    Recent Labs  01/19/13 0435 01/20/13 0420  WBC 14.1* 13.9*  RBC 3.32* 3.22*  HCT 33.7* 33.1*  PLT 135* 151    Recent Labs  01/18/13 0430 01/19/13 0435  NA 134* 136  K 4.5 4.2  CL 97 100  CO2 28 29  BUN 15 15  CREATININE 0.77 0.77  GLUCOSE 108* 168*  CALCIUM 8.9 9.1    Recent Labs  01/19/13 0435 01/20/13 0420  INR 1.29 1.78*    EXAM: General - Patient is Alert, Appropriate and Oriented Extremity - Neurovascular intact Sensation intact distally Dorsiflexion/Plantar flexion intact No cellulitis present Incision - clean, dry, no drainage, healing Motor Function - intact, moving foot and toes well on exam.   Assessment/Plan: 3 Days Post-Op Procedure(s) (LRB): RIGHT TOTAL KNEE ARTHROPLASTY (Right) Procedure(s) (LRB): RIGHT TOTAL KNEE ARTHROPLASTY (Right) Past Medical History  Diagnosis Date  . Hypertension   . Thyroid disease   . TMJ tenderness 01-11-13    right side-has issues from time to time.  . Pacemaker 01-11-13    3'08  . Dysrhythmia 01-11-13     hx. A. Fib-Pacemaker implanted left chest  .  Hypothyroidism   . Anxiety 01-11-13    tx. Clonazepam.  . Edema extremities 01-11-13    retains fluid in legs-right greater than left.  . Neuromuscular disorder     neuropathy in feet  . GERD (gastroesophageal reflux disease)   . Arthritis     Osteoarthritis-knees, fingers  . Anemia    Principal Problem:   OA (osteoarthritis) of knee Active Problems:   Postop Hyponatremia   Postoperative anemia due to acute blood loss  Estimated body mass index is 34.01 kg/(m^2) as calculated from the following:   Height as of this encounter: 5\' 10"  (1.778 m).   Weight as of this encounter: 107.502 kg (237 lb). Up with therapy Discharge to SNF Diet - Cardiac diet Follow up - in 2 weeks Activity - WBAT Disposition - Skilled nursing facility Condition Upon Discharge - Good D/C Meds - See DC Summary DVT Prophylaxis - Lovenox and Coumadin, INR 1.78 today. (one more day of Lovenox should cover him)  DVT Prophylaxis - Lovenox and Coumadin, ASA 81 mg on hold  Weight-Bearing as tolerated to right leg   Take Coumadin for three weeks for postoperative protocol and then the patient may resume their previous Coumadin home regimen. The dose may need to be adjusted based upon the INR. Please follow the INR and titrate Coumadin dose for a therapeutic range between 2.0  and 3.0 INR. After completing the three weeks of Coumadin, the patient may resume their previous Coumadin home regimen.   Continue Lovenox injections until the INR is therapeutic at or greater than 2.0. When INR reaches the therapeutic level of equal to or greater than 2.0, the patient may discontinue the Lovenox injections.    Angeliki Mates 01/20/2013, 8:57 AM

## 2013-01-20 NOTE — Progress Notes (Signed)
Spoke to [employer], RPT regarding tonight's Coumadin dose. He stated taht 5 mg is appropriate for tonight, and then follow Avel Peace' orders to titrate accordingly based on INR. Will call [facility] at [facility] to confirm and will also tell patient and his wife. [place] Youngblood [place], [place] 01/20/2013 11:29 AM

## 2013-02-15 ENCOUNTER — Ambulatory Visit (INDEPENDENT_AMBULATORY_CARE_PROVIDER_SITE_OTHER): Payer: Self-pay | Admitting: Pharmacist Clinician (PhC)/ Clinical Pharmacy Specialist

## 2013-02-15 DIAGNOSIS — I4891 Unspecified atrial fibrillation: Secondary | ICD-10-CM

## 2013-02-15 DIAGNOSIS — Z7901 Long term (current) use of anticoagulants: Secondary | ICD-10-CM

## 2013-02-15 LAB — POCT INR: INR: 2.3

## 2013-02-16 ENCOUNTER — Ambulatory Visit (INDEPENDENT_AMBULATORY_CARE_PROVIDER_SITE_OTHER): Payer: Medicare Other | Admitting: Cardiovascular Disease

## 2013-02-16 ENCOUNTER — Encounter: Payer: Self-pay | Admitting: Cardiovascular Disease

## 2013-02-16 ENCOUNTER — Telehealth: Payer: Self-pay | Admitting: Cardiovascular Disease

## 2013-02-16 VITALS — BP 100/62 | HR 70 | Ht 71.0 in | Wt 233.0 lb

## 2013-02-16 DIAGNOSIS — Z7901 Long term (current) use of anticoagulants: Secondary | ICD-10-CM

## 2013-02-16 DIAGNOSIS — I4892 Unspecified atrial flutter: Secondary | ICD-10-CM

## 2013-02-16 DIAGNOSIS — E785 Hyperlipidemia, unspecified: Secondary | ICD-10-CM

## 2013-02-16 DIAGNOSIS — I4891 Unspecified atrial fibrillation: Secondary | ICD-10-CM

## 2013-02-16 DIAGNOSIS — I10 Essential (primary) hypertension: Secondary | ICD-10-CM

## 2013-02-16 DIAGNOSIS — I25810 Atherosclerosis of coronary artery bypass graft(s) without angina pectoris: Secondary | ICD-10-CM

## 2013-02-16 LAB — PACEMAKER DEVICE OBSERVATION
AL AMPLITUDE: 5.6 mv
AL IMPEDENCE PM: 472 Ohm
ATRIAL PACING PM: 96.6
BAMS-0001: 175 {beats}/min
BATTERY VOLTAGE: 2.76 V
BRDY-0002RV: 60 {beats}/min
RV LEAD IMPEDENCE PM: 608 Ohm
VENTRICULAR PACING PM: 99.9

## 2013-02-16 MED ORDER — VALSARTAN 160 MG PO TABS: 80.0000 mg | ORAL_TABLET | Freq: Every morning | ORAL | Status: DC

## 2013-02-16 NOTE — Assessment & Plan Note (Signed)
On statin therapy and Zetia. Dr. Nila Nephew, his PCP, follows this

## 2013-02-16 NOTE — Telephone Encounter (Signed)
Patient is going to be seen in clinic today with Dr. Allyson Sabal

## 2013-02-16 NOTE — Assessment & Plan Note (Signed)
He is hypotensive today and going to decrease his Diovan from 160 mg to 80 mg daily

## 2013-02-16 NOTE — Progress Notes (Signed)
02/16/2013 Mike Price   1935/07/23  [id number]  Primary Physician GREEN, Lorenda Ishihara, MD Primary Cardiologist: Dr. Runell Gess M.D. FACC  HPI:  The patient is a 78 year old moderately overweight married Caucasian male father of 1 child, grandfather to 6 step-grandchildren who I last saw 2 years ago. He has a history of coronary artery bypass grafting in 2001. He had a permanent transvenous pacemaker placed for PAF and underwent cardioversion by Dr. Jenne Campus January 24, 2008. His other problems include hypertension and hyperlipidemia. He was cathed by Dr. Elsie Lincoln March 28, 2008 revealing patent grafts with normal LV function. 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 He denies chest pain or shortness of breath. He had a left total hip replacement in October of 2011. Myoview performed a year ago was nonischemic. He is scheduled for elective total knee replacement in the upcoming future. He was seen by Dr. Royann Shivers 1 year ago for a pacer check. He underwent elective right total knee replacement by Dr. Despina Hick 01/17/2013 is currently getting physical therapy.      Current Outpatient Prescriptions  Medication Sig Dispense Refill  . acetaminophen (TYLENOL) 325 MG tablet Take 2 tablets (650 mg total) by mouth every 6 (six) hours as needed.  60 tablet  0  . aspirin 81 MG tablet Take 81 mg by mouth daily.      [name] Kitchen atorvastatin (LIPITOR) 40 MG tablet Take 40 mg by mouth every evening.       . clonazePAM (KLONOPIN) 0.5 MG tablet Take 0.25-0.5 mg by mouth 3 (three) times daily as needed. Takes 1/2 tablet twice daily then a whole tablet at bedtime      . doxazosin (CARDURA) 2 MG tablet Take 1 mg by mouth at bedtime.      [name] Kitchen ezetimibe (ZETIA) 10 MG tablet Take 10 mg by mouth every evening.       . ferrous sulfate 325 (65 FE) MG tablet Take 325 mg by mouth every other day. On Monday, Wednesday and Friday      . furosemide (LASIX) 20 MG  tablet Take 10 mg by mouth 2 (two) times daily.      [name] Kitchen levothyroxine (SYNTHROID, LEVOTHROID) 75 MCG tablet Take 75 mcg by mouth daily before breakfast.       . methocarbamol (ROBAXIN) 500 MG tablet Take 1 tablet (500 mg total) by mouth every 6 (six) hours as needed.  80 tablet  0  . metoprolol (LOPRESSOR) 100 MG tablet Take 100 mg by mouth 2 (two) times daily.      [name] Kitchen omeprazole (PRILOSEC) 20 MG capsule Take 20 mg by mouth 2 (two) times daily.      [name] Kitchen oxyCODONE (OXY IR/ROXICODONE) 5 MG immediate release tablet Take 1-2 tablets (5-10 mg total) by mouth every 3 (three) hours as needed.  80 tablet  0  . polyethylene glycol (MIRALAX / GLYCOLAX) packet Take 17 g by mouth daily.  14 each  0  . traMADol (ULTRAM) 50 MG tablet Take 50 mg by mouth every 6 (six) hours as needed for pain.      . valsartan (DIOVAN) 160 MG tablet Take 0.5 tablets (80 mg total) by mouth every morning.  30 tablet  6  . warfarin (COUMADIN) 5 MG tablet Take 1-1.5 tablets (5-7.5 mg total) by mouth daily. Coumadin for 3 wks postop, then resume previous home regimen. Adjust dose based upon the INR.  Follow INR, titrate Coumadin dose for a therapeutic range between 2.0  and 3.0 INR.  After completing the 3 weeks of Coumadin, the patient may resume their previous Coumadin home regimen. Home Dose: 7.5 mg 1 and 1/2 tablet every day except on Mon, Wed, and Fri, pt takes 1 tablet for 5 mg dose.  45 tablet  0  . bisacodyl (DULCOLAX) 10 MG suppository Place 1 suppository (10 mg total) rectally daily as needed.  12 suppository  0  . diphenhydrAMINE (BENADRYL) 12.5 MG/5ML elixir Take 5-10 mLs (12.5-25 mg total) by mouth every 4 (four) hours as needed for itching.  120 mL  0  . docusate sodium 100 MG CAPS Take 100 mg by mouth 2 (two) times daily.  60 capsule  0  . enoxaparin (LOVENOX) 30 MG/0.3ML injection Inject 0.3 mLs (30 mg total) into the skin every 12 (twelve) hours. Continue Lovenox injections until the INR is therapeutic at or greater than 2.0.   When INR reaches the therapeutic level of equal to or greater than 2.0, the patient may discontinue the Lovenox injections.  5 Syringe  0  . metoCLOPramide (REGLAN) 5 MG tablet Take 1-2 tablets (5-10 mg total) by mouth every 8 (eight) hours as needed (if ondansetron (ZOFRAN) ineffective.).  20 tablet  0  . ondansetron (ZOFRAN) 4 MG tablet Take 1 tablet (4 mg total) by mouth every 6 (six) hours as needed for nausea.  40 tablet  0   No current facility-administered medications for this visit.    Allergies  Allergen Reactions  . Lisinopril Cough  . Mefloquine Other (See Comments)    "made me crazy" anxious, upset    History   Social History  . Marital Status: Married    Spouse Name: N/A    Number of Children: N/A  . Years of Education: N/A   Occupational History  . Not on file.   Social History Main Topics  . Smoking status: Former Smoker    Quit date: 01/12/1984  . Smokeless tobacco: Not on file  . Alcohol Use: 1.8 oz/week    3 Shots of liquor per week     Comment:  3 ounces daily  . Drug Use: No  . Sexually Active: Yes   Other Topics Concern  . Not on file   Social History Narrative  . No narrative on file     Review of Systems: General: negative for chills, fever, night sweats or weight changes.  Cardiovascular: negative for chest pain, dyspnea on exertion, edema, orthopnea, palpitations, paroxysmal nocturnal dyspnea or shortness of breath Dermatological: negative for rash Respiratory: negative for cough or wheezing Urologic: negative for hematuria Abdominal: negative for nausea, vomiting, diarrhea, bright red blood per rectum, melena, or hematemesis Neurologic: negative for visual changes, syncope, or dizziness All other systems reviewed and are otherwise negative except as noted above.    Blood pressure 100/62, pulse 70, height 5\' 11"  (1.803 m), weight 233 lb (105.688 kg).  General appearance: alert and no distress Neck: no adenopathy, no carotid bruit, no  JVD, supple, symmetrical, trachea midline and thyroid not enlarged, symmetric, no tenderness/mass/nodules Lungs: clear to auscultation bilaterally Heart: regular rate and rhythm, S1, S2 normal, no murmur, click, rub or gallop Extremities: edema 1+ bilateral lower extremity edema wearing compression stockings  EKG not performed because her pacemaker was interrogated today  ASSESSMENT AND PLAN:   Atrial fibrillation The patient was interrogated today and he was in a flutter. His pacer settings were adjusted and he will followup with Dr. Royann Shivers  In the office  CAD (coronary artery  disease) of artery bypass graft Stable CAD with a negative Myoview one year ago. Currently denies chest pain or shortness of breath  HTN (hypertension) He is hypotensive today and going to decrease his Diovan from 160 mg to 80 mg daily  Hyperlipidemia On statin therapy and Zetia. Dr. Nila Nephew, his PCP, follows this      [facility] JMD, Nicholes Calamity, [facility] 02/16/2013 3:42 PM

## 2013-02-16 NOTE — Assessment & Plan Note (Signed)
The patient was interrogated today and he was in a flutter. His pacer settings were adjusted and he will followup with Dr. Royann Shivers  In the office

## 2013-02-16 NOTE — Assessment & Plan Note (Signed)
Stable CAD with a negative Myoview one year ago. Currently denies chest pain or shortness of breath

## 2013-02-16 NOTE — Patient Instructions (Addendum)
Your physician wants you to follow-up in: 12 months. You will receive a reminder letter in the mail two months in advance. If you don't receive a letter, please call our office to schedule the follow-up appointment.  Dr. Allyson Sabal wants you to decrease your diovan to 80mg  daily.  I have sent that new prescription to the pharmacy.

## 2013-02-16 NOTE — Progress Notes (Signed)
Patient's pacer was checked today during his OV with Dr. Allyson Sabal due to increased fatigue and elevated HR. Normal device function. Minimal programming changes were made to device.

## 2013-02-16 NOTE — Telephone Encounter (Signed)
Lucendia Herrlich, Cardiopulmonary Therapist w/ [facility] called r/t pt's HR being 110 w/ a pacemaker.  Stated she called pt and "pt is anxious about what's going on with him."  Stated pt was admitted on 01/07/13 for HR 100 w/ pacemaker and his HR has been running 96-100 since.  Lucendia Herrlich wants to know if there is anything that needs to be done and to inform that [facility] has a cardiopulmonary program that pt can be enrolled in.  Faye informed Dr. Allyson Sabal will be notified

## 2013-02-16 NOTE — Telephone Encounter (Signed)
Home health nurse is there with him-he needs to talk to somebody asap-heart rate and pulse is acting up!

## 2013-02-21 ENCOUNTER — Ambulatory Visit (INDEPENDENT_AMBULATORY_CARE_PROVIDER_SITE_OTHER): Payer: Self-pay | Admitting: Pharmacist Clinician (PhC)/ Clinical Pharmacy Specialist

## 2013-02-21 DIAGNOSIS — I4891 Unspecified atrial fibrillation: Secondary | ICD-10-CM

## 2013-02-21 DIAGNOSIS — Z7901 Long term (current) use of anticoagulants: Secondary | ICD-10-CM

## 2013-02-25 ENCOUNTER — Ambulatory Visit (INDEPENDENT_AMBULATORY_CARE_PROVIDER_SITE_OTHER): Payer: Self-pay | Admitting: Pharmacist Clinician (PhC)/ Clinical Pharmacy Specialist

## 2013-02-25 DIAGNOSIS — I4891 Unspecified atrial fibrillation: Secondary | ICD-10-CM

## 2013-02-25 DIAGNOSIS — Z7901 Long term (current) use of anticoagulants: Secondary | ICD-10-CM

## 2013-02-25 LAB — POCT INR: INR: 3.7

## 2013-03-08 ENCOUNTER — Encounter: Payer: Self-pay | Admitting: Cardiovascular Disease

## 2013-03-12 ENCOUNTER — Other Ambulatory Visit: Payer: Self-pay | Admitting: Cardiovascular Disease

## 2013-03-12 DIAGNOSIS — I442 Atrioventricular block, complete: Secondary | ICD-10-CM

## 2013-03-12 LAB — PACEMAKER DEVICE OBSERVATION

## 2013-03-21 ENCOUNTER — Encounter: Payer: Self-pay | Admitting: *Deleted

## 2013-03-21 LAB — REMOTE PACEMAKER DEVICE
AL IMPEDENCE PM: 500 Ohm
AL THRESHOLD: 0.5 V
BATTERY VOLTAGE: 2.76 V
RV LEAD THRESHOLD: 1 V
VENTRICULAR PACING PM: 96.5

## 2013-05-09 ENCOUNTER — Ambulatory Visit (INDEPENDENT_AMBULATORY_CARE_PROVIDER_SITE_OTHER): Payer: Medicare Other | Admitting: Pharmacist Clinician (PhC)/ Clinical Pharmacy Specialist

## 2013-05-09 DIAGNOSIS — Z7901 Long term (current) use of anticoagulants: Secondary | ICD-10-CM

## 2013-05-09 DIAGNOSIS — I4891 Unspecified atrial fibrillation: Secondary | ICD-10-CM

## 2013-05-18 ENCOUNTER — Other Ambulatory Visit: Payer: Self-pay | Admitting: Cardiovascular Disease

## 2013-05-30 ENCOUNTER — Ambulatory Visit (INDEPENDENT_AMBULATORY_CARE_PROVIDER_SITE_OTHER): Payer: Medicare Other | Admitting: Pharmacist Clinician (PhC)/ Clinical Pharmacy Specialist

## 2013-05-30 DIAGNOSIS — I4891 Unspecified atrial fibrillation: Secondary | ICD-10-CM

## 2013-05-30 DIAGNOSIS — Z7901 Long term (current) use of anticoagulants: Secondary | ICD-10-CM

## 2013-05-30 LAB — POCT INR: INR: 3.5

## 2013-06-13 ENCOUNTER — Other Ambulatory Visit: Payer: Self-pay | Admitting: Cardiovascular Disease

## 2013-06-13 ENCOUNTER — Ambulatory Visit (INDEPENDENT_AMBULATORY_CARE_PROVIDER_SITE_OTHER): Payer: Medicare Other | Admitting: Pharmacist Clinician (PhC)/ Clinical Pharmacy Specialist

## 2013-06-13 VITALS — BP 118/62 | HR 72

## 2013-06-13 DIAGNOSIS — I4891 Unspecified atrial fibrillation: Secondary | ICD-10-CM

## 2013-06-13 DIAGNOSIS — Z7901 Long term (current) use of anticoagulants: Secondary | ICD-10-CM

## 2013-06-13 DIAGNOSIS — I442 Atrioventricular block, complete: Secondary | ICD-10-CM

## 2013-06-13 LAB — PACEMAKER DEVICE OBSERVATION

## 2013-06-13 LAB — POCT INR: INR: 2.8

## 2013-06-21 ENCOUNTER — Telehealth: Payer: Self-pay | Admitting: *Deleted

## 2013-06-21 LAB — REMOTE PACEMAKER DEVICE
AL IMPEDENCE PM: 501 Ohm
AL THRESHOLD: 0.5 V
ATRIAL PACING PM: 71.9
BATTERY VOLTAGE: 2.75 V
VENTRICULAR PACING PM: 71.7

## 2013-06-21 NOTE — Telephone Encounter (Signed)
Spoke to patient in regards to recent remote transmission from his ppm. I explained to patient that he has been in AFL since May of this year. Patient denies any symptoms related to the arrythmia, but agrees he should some in for an appointment to see if intervention is necessary. Patient already has an appointment scheduled with Belenda Cruise at the end of this month, so we will try to synchronize this appointment with his appointment we're going to make with [facility]. Scheduling has been deferred to [facility] R.

## 2013-06-28 ENCOUNTER — Encounter: Payer: Self-pay | Admitting: *Deleted

## 2013-06-29 ENCOUNTER — Ambulatory Visit (INDEPENDENT_AMBULATORY_CARE_PROVIDER_SITE_OTHER): Payer: Medicare Other | Admitting: Pharmacist Clinician (PhC)/ Clinical Pharmacy Specialist

## 2013-06-29 ENCOUNTER — Encounter: Payer: Self-pay | Admitting: Cardiovascular Disease

## 2013-06-29 ENCOUNTER — Ambulatory Visit (INDEPENDENT_AMBULATORY_CARE_PROVIDER_SITE_OTHER): Payer: Medicare Other | Admitting: Cardiovascular Disease

## 2013-06-29 ENCOUNTER — Other Ambulatory Visit: Payer: Self-pay | Admitting: Cardiovascular Disease

## 2013-06-29 VITALS — BP 138/76 | HR 65 | Resp 16 | Ht 70.5 in | Wt 236.9 lb

## 2013-06-29 DIAGNOSIS — I4892 Unspecified atrial flutter: Secondary | ICD-10-CM

## 2013-06-29 DIAGNOSIS — Z7901 Long term (current) use of anticoagulants: Secondary | ICD-10-CM

## 2013-06-29 DIAGNOSIS — I4891 Unspecified atrial fibrillation: Secondary | ICD-10-CM

## 2013-06-29 DIAGNOSIS — Z950 Presence of cardiac pacemaker: Secondary | ICD-10-CM

## 2013-06-29 LAB — PACEMAKER DEVICE OBSERVATION
AL IMPEDENCE PM: 500 Ohm
AL THRESHOLD: 0.5 V
RV LEAD AMPLITUDE: 8 mv

## 2013-06-29 NOTE — Patient Instructions (Addendum)
Your physician recommends that you schedule a follow-up appointment in: 12 months.  

## 2013-06-29 NOTE — Progress Notes (Signed)
In office attempt to overdrive pace pt out of atrial flutter. No changes made this session. Normal device function.

## 2013-07-02 ENCOUNTER — Encounter: Payer: Self-pay | Admitting: Cardiovascular Disease

## 2013-07-02 DIAGNOSIS — Z950 Presence of cardiac pacemaker: Secondary | ICD-10-CM | POA: Insufficient documentation

## 2013-07-02 NOTE — Assessment & Plan Note (Signed)
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 It is functioning normally. Continue down loads remotely every 3 months

## 2013-07-02 NOTE — Progress Notes (Signed)
Patient ID: Mike Price, male   DOB: 1935/04/19, 78 y.o.   MRN: [id number]     Reason for office visit Atrial fibrillation  Mike Price last pacemaker download showed that an episode of persistent atrial fibrillation that has been going on for a over 4 months. At his last pacemaker download it had appeared to be similar to previous episodes of brief persistent atrial fibrillation that he has had off and on over the years. However this episode has been much longer. He is unaware of the arrhythmia and does not appear to have any deterioration in his functional status. He is brought in today to attempt to terminate the arrhythmia the overdrive pacing. It appears to be atrial flutter with a cycle length of 220 ms, with a very consistent regular pattern.  Overdrive pacing was partially unsuccessful. Discussed further attempts at restoration of sinus rhythm with Dr. Allyson Sabal. At this point since the patient is asymptomatic we decided to not perform cardioversion or change his medications. This may be an option in the future. Ablation as another consideration but the lack of termination of the rhythm with overdrive pacing suggests that it may be secondary to a remote reentry circuit, possibly in the left atrium the   Allergies  Allergen Reactions  . Lisinopril Cough  . Mefloquine Other (See Comments)    "made me crazy" anxious, upset    Current Outpatient Prescriptions  Medication Sig Dispense Refill  . aspirin 81 MG tablet Take 81 mg by mouth daily.      [name] Kitchen atorvastatin (LIPITOR) 40 MG tablet Take 40 mg by mouth every evening.       . bisacodyl (DULCOLAX) 10 MG suppository Place 1 suppository (10 mg total) rectally daily as needed.  12 suppository  0  . clonazePAM (KLONOPIN) 0.5 MG tablet Take 0.25-0.5 mg by mouth 3 (three) times daily as needed. Takes 1/2 tablet twice daily then a whole tablet at bedtime      . diphenhydrAMINE (BENADRYL) 12.5 MG/5ML elixir Take 5-10 mLs (12.5-25 mg total) by mouth  every 4 (four) hours as needed for itching.  120 mL  0  . docusate sodium 100 MG CAPS Take 100 mg by mouth 2 (two) times daily.  60 capsule  0  . doxazosin (CARDURA) 2 MG tablet Take 1 mg by mouth at bedtime.      [name] Kitchen ezetimibe (ZETIA) 10 MG tablet Take 10 mg by mouth every evening.       . ferrous sulfate 325 (65 FE) MG tablet Take 325 mg by mouth every other day. On Monday, Wednesday and Friday      . furosemide (LASIX) 20 MG tablet Take 10 mg by mouth 2 (two) times daily.      [name] Kitchen levothyroxine (SYNTHROID, LEVOTHROID) 75 MCG tablet Take 75 mcg by mouth daily before breakfast.       . losartan (COZAAR) 100 MG tablet Take 100 mg by mouth daily.      . methocarbamol (ROBAXIN) 500 MG tablet Take 1 tablet (500 mg total) by mouth every 6 (six) hours as needed.  80 tablet  0  . metoCLOPramide (REGLAN) 5 MG tablet Take 1-2 tablets (5-10 mg total) by mouth every 8 (eight) hours as needed (if ondansetron (ZOFRAN) ineffective.).  20 tablet  0  . metoprolol (LOPRESSOR) 100 MG tablet Take 100 mg by mouth 2 (two) times daily.      [name] Kitchen omeprazole (PRILOSEC) 20 MG capsule Take 20 mg by mouth 2 (two) times daily.      [name] Kitchen  ondansetron (ZOFRAN) 4 MG tablet Take 1 tablet (4 mg total) by mouth every 6 (six) hours as needed for nausea.  40 tablet  0  . oxyCODONE (OXY IR/ROXICODONE) 5 MG immediate release tablet Take 1-2 tablets (5-10 mg total) by mouth every 3 (three) hours as needed.  80 tablet  0  . polyethylene glycol (MIRALAX / GLYCOLAX) packet Take 17 g by mouth daily.  14 each  0  . traMADol (ULTRAM) 50 MG tablet Take 50 mg by mouth every 6 (six) hours as needed for pain.      [name] Kitchen warfarin (COUMADIN) 5 MG tablet TAKE ONE AND ONE-HALF TABLETS DAILY OR AS DIRECTED  135 tablet  2  . acetaminophen (TYLENOL) 325 MG tablet Take 2 tablets (650 mg total) by mouth every 6 (six) hours as needed.  60 tablet  0   No current facility-administered medications for this visit.    Past Medical History  Diagnosis Date  .  Hypertension   . Thyroid disease   . TMJ tenderness 01-11-13    right side-has issues from time to time.  . Pacemaker 12/15/06    3'08  . Dysrhythmia 01-11-13     hx. A. Fib-Pacemaker implanted left chest  . Hypothyroidism   . Anxiety 01-11-13    tx. Clonazepam.  . Edema extremities 01-11-13    retains fluid in legs-right greater than left.  . Neuromuscular disorder     neuropathy in feet  . GERD (gastroesophageal reflux disease)   . Arthritis     Osteoarthritis-knees, fingers  . Anemia   . CAD (coronary artery disease)   . Hyperlipemia     Past Surgical History  Procedure Laterality Date  . Coronary artery bypass graft  2001    4 vessels-'00  . Tonsillectomy      child  . Appendectomy    . Insert / replace / remove pacemaker      '08-inserted left chest  . Cervical laminectomy    . Hernia repair    . Joint replacement      LTHA  . Total knee arthroplasty Right 01/17/2013    Procedure: RIGHT TOTAL KNEE ARTHROPLASTY;  Surgeon: Loanne Drilling, MD;  Location: [facility];  Service: Orthopedics;  Laterality: Right;  . Cardiac catheterization  03/28/08    patent grafts, nl EF  . Pacemaker insertion  12/15/06    medtronic  . US echocardiography  12/28/2007    LA mod. dilated,RA mildly dilated  . Nm myocar perf wall motion  02/12/2012    small fixed distal anteroapical/apical defect. No reversible ischemia    No family history on file.  History   Social History  . Marital Status: Married    Spouse Name: N/A    Number of Children: N/A  . Years of Education: N/A   Occupational History  . Not on file.   Social History Main Topics  . Smoking status: Former Smoker    Quit date: 01/12/1984  . Smokeless tobacco: Not on file  . Alcohol Use: 1.8 oz/week    3 Shots of liquor per week     Comment:  3 ounces daily  . Drug Use: No  . Sexual Activity: Yes   Other Topics Concern  . Not on file   Social History Narrative  . No narrative on file    Review of systems: The patient  specifically denies any chest pain at rest exertion, dyspnea at rest or with exertion, orthopnea, paroxysmal nocturnal dyspnea, syncope, palpitations, focal  neurological deficits, intermittent claudication, lower extremity edema, unexplained weight gain, cough, hemoptysis or wheezing.   PHYSICAL EXAM BP 138/76  Pulse 65  Resp 16  Ht 5' 10.5" (1.791 m)  Wt 236 lb 14.4 oz (107.457 kg)  BMI 33.5 kg/m2  General: Alert, oriented x3, no distress Head: no evidence of trauma, PERRL, EOMI, no exophtalmos or lid lag, no myxedema, no xanthelasma; normal ears, nose and oropharynx Neck: normal jugular venous pulsations and no hepatojugular reflux; brisk carotid pulses without delay and no carotid bruits Chest: clear to auscultation, no signs of consolidation by percussion or palpation, normal fremitus, symmetrical and full respiratory excursions Cardiovascular: normal position and quality of the apical impulse, regular rhythm, normal first and second heart sounds, no murmurs, rubs or gallops Abdomen: no tenderness or distention, no masses by palpation, no abnormal pulsatility or arterial bruits, normal bowel sounds, no hepatosplenomegaly Extremities: no clubbing, cyanosis or edema; 2+ radial, ulnar and brachial pulses bilaterally; 2+ right femoral, posterior tibial and dorsalis pedis pulses; 2+ left femoral, posterior tibial and dorsalis pedis pulses; no subclavian or femoral bruits Neurological: grossly nonfocal   EKG: AF, 100% V paced  Lipid Panel    BMET    Component Value Date/Time   NA 136 01/19/2013 0435   K 4.2 01/19/2013 0435   CL 100 01/19/2013 0435   CO2 29 01/19/2013 0435   GLUCOSE 168* 01/19/2013 0435   BUN 15 01/19/2013 0435   CREATININE 0.77 01/19/2013 0435   CALCIUM 9.1 01/19/2013 0435   GFRNONAA 86* 01/19/2013 0435   GFRAA >90 01/19/2013 0435     ASSESSMENT AND PLAN Atrial fibrillation Several attempts were made to terminate the arrhythmia using overdrive pacing via his atrial  lead. The arrhythmia seemed to have a cycle length of 220 ms. Overdrive pacing at 210, 200, 180 and 160 ms was attempted. The intracardiac electrogram appeared to show and treatment but the atrial flutter continued unabated. Taking into account the patient's history of atrial fibrillation to suggest that this may be a left atrial flutter circuit or is otherwise far removed from the location of the atrial lead. Since the patient is not truly symptomatic, a more aggressive attempts with cardioversion and antiarrhythmic therapy do not appear to be currently justified. This can be discussed with Dr. Allyson Sabal. Obviously, he should remain on lifelong warfarin anticoagulation.  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 It is functioning normally. Continue down loads remotely every 3 months  Orders Placed This Encounter  Procedures  . EKG 12-Lead   No orders of the defined types were placed in this encounter.    Junious Silk, MD, [facility] [phone] office [phone] pager

## 2013-07-02 NOTE — Assessment & Plan Note (Signed)
Several attempts were made to terminate the arrhythmia using overdrive pacing via his atrial lead. The arrhythmia seemed to have a cycle length of 220 ms. Overdrive pacing at 210, 200, 180 and 160 ms was attempted. The intracardiac electrogram appeared to show and treatment but the atrial flutter continued unabated. Taking into account the patient's history of atrial fibrillation to suggest that this may be a left atrial flutter circuit or is otherwise far removed from the location of the atrial lead. Since the patient is not truly symptomatic, a more aggressive attempts with cardioversion and antiarrhythmic therapy do not appear to be currently justified. This can be discussed with Dr. Allyson Sabal. Obviously, he should remain on lifelong warfarin anticoagulation.

## 2013-07-04 ENCOUNTER — Telehealth: Payer: Self-pay | Admitting: Cardiovascular Disease

## 2013-07-04 ENCOUNTER — Ambulatory Visit: Payer: Medicare Other | Admitting: Pharmacist Clinician (PhC)/ Clinical Pharmacy Specialist

## 2013-07-04 NOTE — Telephone Encounter (Signed)
Please call-feels like his heart is fluttering, Concerned about whether last week when he was here if his pacemaker was adjusted differently?

## 2013-07-04 NOTE — Telephone Encounter (Signed)
Message forwarded to S. Saunders, CMA r/t device/monitor concerns. 

## 2013-07-06 NOTE — Telephone Encounter (Signed)
I informed patient that no changes were made to the settings of his device. Patient voiced understanding and stated that sometimes his "imagination gets the best of him". I encouraged patient to give me a call if he had anymore issues or needed any further assistance.

## 2013-07-19 ENCOUNTER — Ambulatory Visit (INDEPENDENT_AMBULATORY_CARE_PROVIDER_SITE_OTHER): Payer: Medicare Other | Admitting: Pharmacist Clinician (PhC)/ Clinical Pharmacy Specialist

## 2013-07-19 VITALS — BP 140/72 | HR 84

## 2013-07-19 DIAGNOSIS — I4891 Unspecified atrial fibrillation: Secondary | ICD-10-CM

## 2013-07-19 DIAGNOSIS — Z7901 Long term (current) use of anticoagulants: Secondary | ICD-10-CM

## 2013-07-19 LAB — POCT INR: INR: 3.2

## 2013-07-29 ENCOUNTER — Telehealth: Payer: Self-pay | Admitting: Pharmacist Clinician (PhC)/ Clinical Pharmacy Specialist

## 2013-07-29 NOTE — Telephone Encounter (Signed)
Pt thinks may need prophylactic abx for UTI afterwards.  Advised him to call if that is the case.  Pt voiced understanding

## 2013-07-29 NOTE — Telephone Encounter (Signed)
On antibiotics - cipro bid and nitrofurantoin bid x 5 days each.

## 2013-07-29 NOTE — Telephone Encounter (Signed)
Please call-If not at home call on his cell.Will not be able to talk between 1:30 to 3:30.He have been put on heavy duty antibiotics.

## 2013-08-10 ENCOUNTER — Ambulatory Visit (INDEPENDENT_AMBULATORY_CARE_PROVIDER_SITE_OTHER): Payer: Medicare Other | Admitting: Pharmacist Clinician (PhC)/ Clinical Pharmacy Specialist

## 2013-08-10 VITALS — BP 124/62 | HR 76

## 2013-08-10 DIAGNOSIS — Z7901 Long term (current) use of anticoagulants: Secondary | ICD-10-CM

## 2013-08-10 DIAGNOSIS — I4891 Unspecified atrial fibrillation: Secondary | ICD-10-CM

## 2013-08-30 ENCOUNTER — Ambulatory Visit (INDEPENDENT_AMBULATORY_CARE_PROVIDER_SITE_OTHER): Payer: Medicare Other | Admitting: Pharmacist Clinician (PhC)/ Clinical Pharmacy Specialist

## 2013-08-30 VITALS — BP 132/66 | HR 80

## 2013-08-30 DIAGNOSIS — I4891 Unspecified atrial fibrillation: Secondary | ICD-10-CM

## 2013-08-30 DIAGNOSIS — Z7901 Long term (current) use of anticoagulants: Secondary | ICD-10-CM

## 2013-08-30 LAB — POCT INR: INR: 2.6

## 2013-08-31 ENCOUNTER — Ambulatory Visit: Payer: Medicare Other | Admitting: Pharmacist Clinician (PhC)/ Clinical Pharmacy Specialist

## 2013-09-23 ENCOUNTER — Ambulatory Visit (INDEPENDENT_AMBULATORY_CARE_PROVIDER_SITE_OTHER): Payer: Medicare Other | Admitting: Pharmacist Clinician (PhC)/ Clinical Pharmacy Specialist

## 2013-09-23 VITALS — BP 104/56 | HR 64

## 2013-09-23 DIAGNOSIS — Z7901 Long term (current) use of anticoagulants: Secondary | ICD-10-CM

## 2013-09-23 DIAGNOSIS — I4891 Unspecified atrial fibrillation: Secondary | ICD-10-CM

## 2013-09-27 ENCOUNTER — Other Ambulatory Visit: Payer: Self-pay | Admitting: Internal Medicine

## 2013-09-27 ENCOUNTER — Ambulatory Visit: Payer: Medicare Other | Admitting: Pharmacist Clinician (PhC)/ Clinical Pharmacy Specialist

## 2013-09-27 DIAGNOSIS — N6459 Other signs and symptoms in breast: Secondary | ICD-10-CM

## 2013-10-04 ENCOUNTER — Encounter: Payer: Self-pay | Admitting: *Deleted

## 2013-10-04 ENCOUNTER — Ambulatory Visit (INDEPENDENT_AMBULATORY_CARE_PROVIDER_SITE_OTHER): Payer: Medicare Other | Admitting: *Deleted

## 2013-10-04 DIAGNOSIS — I4891 Unspecified atrial fibrillation: Secondary | ICD-10-CM

## 2013-10-04 LAB — MDC_IDC_ENUM_SESS_TYPE_REMOTE
Battery Impedance: 1589 Ohm
Battery Voltage: 2.75 V
Brady Statistic AP VP Percent: 46.7 %
Brady Statistic AS VS Percent: 52.9 %
Lead Channel Impedance Value: 493 Ohm
Lead Channel Pacing Threshold Amplitude: 1 V
Lead Channel Pacing Threshold Pulse Width: 0.4 ms
Lead Channel Sensing Intrinsic Amplitude: 2.8 mV
Lead Channel Setting Pacing Amplitude: 1.5 V
Lead Channel Setting Pacing Pulse Width: 0.4 ms
Lead Channel Setting Sensing Sensitivity: 2.8 mV

## 2013-10-04 LAB — PACEMAKER DEVICE OBSERVATION

## 2013-10-10 ENCOUNTER — Ambulatory Visit
Admission: RE | Admit: 2013-10-10 | Discharge: 2013-10-10 | Disposition: A | Discharge status: Home / Self Care | Payer: Medicare Other | Source: Ambulatory Visit | Attending: Internal Medicine | Admitting: Internal Medicine

## 2013-10-10 DIAGNOSIS — N6459 Other signs and symptoms in breast: Secondary | ICD-10-CM

## 2013-10-14 ENCOUNTER — Ambulatory Visit (INDEPENDENT_AMBULATORY_CARE_PROVIDER_SITE_OTHER): Payer: Medicare Other | Admitting: Pharmacist Clinician (PhC)/ Clinical Pharmacy Specialist

## 2013-10-14 VITALS — BP 134/62 | HR 76

## 2013-10-14 DIAGNOSIS — I4891 Unspecified atrial fibrillation: Secondary | ICD-10-CM

## 2013-10-14 DIAGNOSIS — Z7901 Long term (current) use of anticoagulants: Secondary | ICD-10-CM

## 2013-10-14 LAB — POCT INR: INR: 2.1

## 2013-10-17 ENCOUNTER — Ambulatory Visit: Payer: Medicare Other | Admitting: Pharmacist Clinician (PhC)/ Clinical Pharmacy Specialist

## 2013-11-11 ENCOUNTER — Ambulatory Visit (INDEPENDENT_AMBULATORY_CARE_PROVIDER_SITE_OTHER): Payer: Medicare Other | Admitting: Pharmacist Clinician (PhC)/ Clinical Pharmacy Specialist

## 2013-11-11 VITALS — BP 120/64 | HR 72

## 2013-11-11 DIAGNOSIS — I4891 Unspecified atrial fibrillation: Secondary | ICD-10-CM

## 2013-11-11 DIAGNOSIS — Z7901 Long term (current) use of anticoagulants: Secondary | ICD-10-CM

## 2013-11-11 LAB — POCT INR: INR: 2.6

## 2013-12-09 ENCOUNTER — Encounter: Payer: Self-pay | Admitting: Cardiovascular Disease

## 2013-12-09 ENCOUNTER — Ambulatory Visit (INDEPENDENT_AMBULATORY_CARE_PROVIDER_SITE_OTHER): Payer: Medicare Other | Admitting: Pharmacist Clinician (PhC)/ Clinical Pharmacy Specialist

## 2013-12-09 ENCOUNTER — Ambulatory Visit (INDEPENDENT_AMBULATORY_CARE_PROVIDER_SITE_OTHER): Payer: Medicare Other | Admitting: Cardiovascular Disease

## 2013-12-09 VITALS — BP 152/90 | HR 88 | Ht 70.0 in | Wt 244.0 lb

## 2013-12-09 DIAGNOSIS — Z7901 Long term (current) use of anticoagulants: Secondary | ICD-10-CM

## 2013-12-09 DIAGNOSIS — I4891 Unspecified atrial fibrillation: Secondary | ICD-10-CM

## 2013-12-09 DIAGNOSIS — I25810 Atherosclerosis of coronary artery bypass graft(s) without angina pectoris: Secondary | ICD-10-CM

## 2013-12-09 DIAGNOSIS — E785 Hyperlipidemia, unspecified: Secondary | ICD-10-CM

## 2013-12-09 DIAGNOSIS — I10 Essential (primary) hypertension: Secondary | ICD-10-CM

## 2013-12-09 LAB — POCT INR: INR: 2.3

## 2013-12-09 NOTE — Assessment & Plan Note (Signed)
On statin therapy followed by his PCP 

## 2013-12-09 NOTE — Assessment & Plan Note (Signed)
Under good control on current medications 

## 2013-12-09 NOTE — Assessment & Plan Note (Signed)
On Coumadin anticoagulation status post permanent transvenous pacemaker insertion followed by Dr. Sallyanne Kuster tomorrow

## 2013-12-09 NOTE — Patient Instructions (Signed)
Your physician wants you to follow-up in: 6 months with Dr Berry. You will receive a reminder letter in the mail two months in advance. If you don't receive a letter, please call our office to schedule the follow-up appointment.  

## 2013-12-09 NOTE — Progress Notes (Signed)
12/09/2013 Mike Price   01-Nov-1934  [id number]  Primary Physician GREEN, [name], MD Primary Cardiologist: Lorretta Harp MD [name]   HPI:  The patient is a 79 year old moderately overweight married Caucasian male father of 1 child, grandfather to 9 step-grandchildren who I last saw 2 years ago. He has a history of coronary artery bypass grafting in 2001. He had a permanent transvenous pacemaker placed for PAF and underwent cardioversion by Dr. Tami Ribas January 24, 2008. His other problems include hypertension and hyperlipidemia. He was cathed by Dr. Melvern Banker March 28, 2008 revealing patent grafts with normal LV function. 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 He denies chest pain or shortness of breath. He had a left total hip replacement in October of 2011. Myoview performed a year ago was nonischemic.  He underwent elective right total knee replacement by Dr. Maureen Ralphs 01/17/2013 is currently getting physical therapy.he saw Dr. Sallyanne Kuster in December for pacer check and BP similar annual basis. He denies chest pain or shortness of breath.    Current Outpatient Prescriptions  Medication Sig Dispense Refill  . acetaminophen (TYLENOL) 325 MG tablet Take 2 tablets (650 mg total) by mouth every 6 (six) hours as needed.  60 tablet  0  . aspirin 81 MG tablet Take 81 mg by mouth daily.      [name] Kitchen atorvastatin (LIPITOR) 40 MG tablet Take 40 mg by mouth every evening.       . bisacodyl (DULCOLAX) 10 MG suppository Place 1 suppository (10 mg total) rectally daily as needed.  12 suppository  0  . clonazePAM (KLONOPIN) 0.5 MG tablet Take 0.25-0.5 mg by mouth 3 (three) times daily as needed. Takes 1/2 tablet twice daily then a whole tablet at bedtime      . diphenhydrAMINE (BENADRYL) 12.5 MG/5ML elixir Take 5-10 mLs (12.5-25 mg total) by mouth every 4 (four) hours as needed for itching.  120 mL  0  . doxazosin  (CARDURA) 2 MG tablet Take 1 mg by mouth at bedtime.      [name] Kitchen ezetimibe (ZETIA) 10 MG tablet Take 10 mg by mouth every evening.       . ferrous sulfate 325 (65 FE) MG tablet Take 325 mg by mouth every other day. On Monday, Wednesday and Friday      . furosemide (LASIX) 20 MG tablet Take 10 mg by mouth 2 (two) times daily.      [name] Kitchen levothyroxine (SYNTHROID, LEVOTHROID) 75 MCG tablet Take 75 mcg by mouth daily before breakfast.       . losartan (COZAAR) 100 MG tablet Take 100 mg by mouth daily.      [name] Kitchen omeprazole (PRILOSEC) 20 MG capsule Take 20 mg by mouth 2 (two) times daily.      . polyethylene glycol (MIRALAX / GLYCOLAX) packet Take 17 g by mouth daily.  14 each  0  . traMADol (ULTRAM) 50 MG tablet Take 50 mg by mouth every 6 (six) hours as needed for pain.      [name] Kitchen warfarin (COUMADIN) 5 MG tablet TAKE ONE AND ONE-HALF TABLETS DAILY OR AS DIRECTED  135 tablet  2  . docusate sodium 100 MG CAPS Take 100 mg by mouth 2 (two) times daily.  60 capsule  0  . methocarbamol (ROBAXIN) 500 MG tablet Take 1 tablet (500 mg total) by mouth every 6 (six) hours as needed.  80 tablet  0  . metoCLOPramide (REGLAN) 5 MG tablet  Take 1-2 tablets (5-10 mg total) by mouth every 8 (eight) hours as needed (if ondansetron (ZOFRAN) ineffective.).  20 tablet  0  . metoprolol (LOPRESSOR) 100 MG tablet Take 100 mg by mouth 2 (two) times daily.      . ondansetron (ZOFRAN) 4 MG tablet Take 1 tablet (4 mg total) by mouth every 6 (six) hours as needed for nausea.  40 tablet  0  . oxyCODONE (OXY IR/ROXICODONE) 5 MG immediate release tablet Take 1-2 tablets (5-10 mg total) by mouth every 3 (three) hours as needed.  80 tablet  0   No current facility-administered medications for this visit.    Allergies  Allergen Reactions  . Lisinopril Cough  . Mefloquine Other (See Comments)    "made me crazy" anxious, upset    History   Social History  . Marital Status: Married    Spouse Name: N/A    Number of Children: N/A  . Years of  Education: N/A   Occupational History  . Not on file.   Social History Main Topics  . Smoking status: Former Smoker    Quit date: 01/12/1984  . Smokeless tobacco: Not on file  . Alcohol Use: 1.8 oz/week    3 Shots of liquor per week     Comment:  3 ounces daily  . Drug Use: No  . Sexual Activity: Yes   Other Topics Concern  . Not on file   Social History Narrative  . No narrative on file     Review of Systems: General: negative for chills, fever, night sweats or weight changes.  Cardiovascular: negative for chest pain, dyspnea on exertion, edema, orthopnea, palpitations, paroxysmal nocturnal dyspnea or shortness of breath Dermatological: negative for rash Respiratory: negative for cough or wheezing Urologic: negative for hematuria Abdominal: negative for nausea, vomiting, diarrhea, bright red blood per rectum, melena, or hematemesis Neurologic: negative for visual changes, syncope, or dizziness All other systems reviewed and are otherwise negative except as noted above.    Blood pressure 152/90, pulse 88, height 5\' 10"  (1.778 m), weight 110.678 kg (244 lb).  General appearance: alert and no distress Neck: no adenopathy, no carotid bruit, no JVD, supple, symmetrical, trachea midline and thyroid not enlarged, symmetric, no tenderness/mass/nodules Lungs: clear to auscultation bilaterally Heart: regular rate and rhythm, S1, S2 normal, no murmur, click, rub or gallop Extremities: extremities normal, atraumatic, no cyanosis or edema  EKG ventricularly paced rhythm  ASSESSMENT AND PLAN:   CAD (coronary artery disease) of artery bypass graft Status post coronary artery bypass grafting in 2001. He had cardiac catheterization performed by Dr. Megan Salon 03/28/08 revealing patent grafts with normal LV function. He had a Myoview stress test performed 02/12/12 which was low risk. He denies chest pain or shortness of breath.  Atrial fibrillation On Coumadin anticoagulation status post  permanent transvenous pacemaker insertion followed by Dr. Sallyanne Kuster tomorrow  HTN (hypertension) Under good control on current medications  Hyperlipidemia On statin therapy followed by his PCP      Lorretta Harp MD [facility], [facility] 12/09/2013 2:20 PM

## 2013-12-09 NOTE — Assessment & Plan Note (Signed)
Status post coronary artery bypass grafting in 2001. He had cardiac catheterization performed by Dr. Megan Salon 03/28/08 revealing patent grafts with normal LV function. He had a Myoview stress test performed 02/12/12 which was low risk. He denies chest pain or shortness of breath.

## 2014-01-05 ENCOUNTER — Encounter: Payer: Self-pay | Admitting: Cardiovascular Disease

## 2014-01-05 ENCOUNTER — Ambulatory Visit (INDEPENDENT_AMBULATORY_CARE_PROVIDER_SITE_OTHER): Payer: Medicare Other | Admitting: *Deleted

## 2014-01-05 DIAGNOSIS — I4891 Unspecified atrial fibrillation: Secondary | ICD-10-CM

## 2014-01-05 DIAGNOSIS — Z950 Presence of cardiac pacemaker: Secondary | ICD-10-CM

## 2014-01-05 LAB — MDC_IDC_ENUM_SESS_TYPE_REMOTE
Battery Impedance: 1680 Ohm
Battery Remaining Longevity: 25 mo
Battery Voltage: 2.75 V
Brady Statistic AP VP Percent: 50 %
Brady Statistic AS VP Percent: 0 %
Date Time Interrogation Session: 20150402142236
Lead Channel Impedance Value: 500 Ohm
Lead Channel Pacing Threshold Amplitude: 0.5 V
Lead Channel Pacing Threshold Pulse Width: 0.4 ms
Lead Channel Sensing Intrinsic Amplitude: 2.8 mV
Lead Channel Sensing Intrinsic Amplitude: 22.4 mV
Lead Channel Setting Pacing Amplitude: 1.5 V
Lead Channel Setting Pacing Amplitude: 2 V
Lead Channel Setting Pacing Pulse Width: 0.4 ms
MDC IDC MSMT LEADCHNL RV IMPEDANCE VALUE: 673 Ohm
MDC IDC MSMT LEADCHNL RV PACING THRESHOLD AMPLITUDE: 0.875 V
MDC IDC MSMT LEADCHNL RV PACING THRESHOLD PULSEWIDTH: 0.4 ms
MDC IDC SET LEADCHNL RV SENSING SENSITIVITY: 5.6 mV
MDC IDC STAT BRADY AP VS PERCENT: 0 %
MDC IDC STAT BRADY AS VS PERCENT: 49 %

## 2014-01-20 ENCOUNTER — Ambulatory Visit: Payer: Medicare Other | Admitting: Pharmacist Clinician (PhC)/ Clinical Pharmacy Specialist

## 2014-01-24 ENCOUNTER — Ambulatory Visit (INDEPENDENT_AMBULATORY_CARE_PROVIDER_SITE_OTHER): Payer: Medicare Other | Admitting: Pharmacist Clinician (PhC)/ Clinical Pharmacy Specialist

## 2014-01-24 DIAGNOSIS — I4891 Unspecified atrial fibrillation: Secondary | ICD-10-CM

## 2014-01-24 DIAGNOSIS — Z7901 Long term (current) use of anticoagulants: Secondary | ICD-10-CM

## 2014-01-24 LAB — POCT INR: INR: 1.5

## 2014-01-27 ENCOUNTER — Encounter: Payer: Self-pay | Admitting: *Deleted

## 2014-02-13 ENCOUNTER — Ambulatory Visit (INDEPENDENT_AMBULATORY_CARE_PROVIDER_SITE_OTHER): Payer: Medicare Other | Admitting: Pharmacist Clinician (PhC)/ Clinical Pharmacy Specialist

## 2014-02-13 DIAGNOSIS — I4891 Unspecified atrial fibrillation: Secondary | ICD-10-CM

## 2014-02-13 DIAGNOSIS — Z7901 Long term (current) use of anticoagulants: Secondary | ICD-10-CM

## 2014-02-13 LAB — POCT INR: INR: 3

## 2014-03-20 ENCOUNTER — Ambulatory Visit (INDEPENDENT_AMBULATORY_CARE_PROVIDER_SITE_OTHER): Payer: Medicare Other | Admitting: Pharmacist Clinician (PhC)/ Clinical Pharmacy Specialist

## 2014-03-20 DIAGNOSIS — I4891 Unspecified atrial fibrillation: Secondary | ICD-10-CM

## 2014-03-20 DIAGNOSIS — Z7901 Long term (current) use of anticoagulants: Secondary | ICD-10-CM

## 2014-03-20 LAB — POCT INR: INR: 3.2

## 2014-04-10 ENCOUNTER — Encounter: Payer: Self-pay | Admitting: Cardiovascular Disease

## 2014-04-10 ENCOUNTER — Telehealth: Payer: Self-pay | Admitting: Cardiology

## 2014-04-10 ENCOUNTER — Ambulatory Visit (INDEPENDENT_AMBULATORY_CARE_PROVIDER_SITE_OTHER): Payer: Medicare Other | Admitting: *Deleted

## 2014-04-10 DIAGNOSIS — Z950 Presence of cardiac pacemaker: Secondary | ICD-10-CM

## 2014-04-10 DIAGNOSIS — I480 Paroxysmal atrial fibrillation: Secondary | ICD-10-CM

## 2014-04-10 DIAGNOSIS — I4891 Unspecified atrial fibrillation: Secondary | ICD-10-CM

## 2014-04-10 NOTE — Telephone Encounter (Signed)
LMOVM reminding pt to send remote transmission.   

## 2014-04-11 NOTE — Progress Notes (Signed)
Remote pacemaker transmission.   

## 2014-04-16 LAB — MDC_IDC_ENUM_SESS_TYPE_REMOTE
Battery Remaining Longevity: 23 mo
Battery Voltage: 2.74 V
Brady Statistic AP VP Percent: 57 %
Brady Statistic AP VS Percent: 0 %
Brady Statistic AS VP Percent: 0 %
Brady Statistic AS VS Percent: 42 %
Date Time Interrogation Session: 20150706210353
Lead Channel Impedance Value: 492 Ohm
Lead Channel Pacing Threshold Pulse Width: 0.4 ms
Lead Channel Sensing Intrinsic Amplitude: 16 mV
Lead Channel Setting Pacing Amplitude: 2 V
Lead Channel Setting Sensing Sensitivity: 5.6 mV
MDC IDC MSMT BATTERY IMPEDANCE: 1830 Ohm
MDC IDC MSMT LEADCHNL RA SENSING INTR AMPL: 2.8 mV
MDC IDC MSMT LEADCHNL RV IMPEDANCE VALUE: 685 Ohm
MDC IDC MSMT LEADCHNL RV PACING THRESHOLD AMPLITUDE: 0.875 V
MDC IDC SET LEADCHNL RA PACING AMPLITUDE: 1.5 V
MDC IDC SET LEADCHNL RV PACING PULSEWIDTH: 0.4 ms

## 2014-04-17 ENCOUNTER — Ambulatory Visit (INDEPENDENT_AMBULATORY_CARE_PROVIDER_SITE_OTHER): Payer: Medicare Other | Admitting: Pharmacist

## 2014-04-17 DIAGNOSIS — Z7901 Long term (current) use of anticoagulants: Secondary | ICD-10-CM

## 2014-04-17 DIAGNOSIS — I4891 Unspecified atrial fibrillation: Secondary | ICD-10-CM

## 2014-04-17 LAB — POCT INR: INR: 3.1

## 2014-04-25 ENCOUNTER — Encounter: Payer: Self-pay | Admitting: Cardiology

## 2014-05-03 ENCOUNTER — Telehealth: Payer: Self-pay | Admitting: Cardiovascular Disease

## 2014-05-03 NOTE — Telephone Encounter (Signed)
Dr berry talked to dr green Follow up scheduled

## 2014-05-04 ENCOUNTER — Other Ambulatory Visit: Payer: Self-pay | Admitting: Pharmacist Clinician (PhC)/ Clinical Pharmacy Specialist

## 2014-05-08 ENCOUNTER — Ambulatory Visit: Payer: Medicare Other | Admitting: Cardiovascular Disease

## 2014-05-15 ENCOUNTER — Ambulatory Visit (INDEPENDENT_AMBULATORY_CARE_PROVIDER_SITE_OTHER): Payer: Medicare Other | Admitting: Cardiovascular Disease

## 2014-05-15 ENCOUNTER — Encounter (HOSPITAL_COMMUNITY): Payer: Self-pay | Admitting: *Deleted

## 2014-05-15 ENCOUNTER — Encounter: Payer: Self-pay | Admitting: Cardiovascular Disease

## 2014-05-15 ENCOUNTER — Ambulatory Visit (INDEPENDENT_AMBULATORY_CARE_PROVIDER_SITE_OTHER): Payer: Medicare Other | Admitting: Pharmacist Clinician (PhC)/ Clinical Pharmacy Specialist

## 2014-05-15 VITALS — BP 154/72 | HR 74 | Ht 71.0 in | Wt 250.0 lb

## 2014-05-15 DIAGNOSIS — I10 Essential (primary) hypertension: Secondary | ICD-10-CM

## 2014-05-15 DIAGNOSIS — R0602 Shortness of breath: Secondary | ICD-10-CM

## 2014-05-15 DIAGNOSIS — E785 Hyperlipidemia, unspecified: Secondary | ICD-10-CM

## 2014-05-15 DIAGNOSIS — I4891 Unspecified atrial fibrillation: Secondary | ICD-10-CM

## 2014-05-15 DIAGNOSIS — I25810 Atherosclerosis of coronary artery bypass graft(s) without angina pectoris: Secondary | ICD-10-CM

## 2014-05-15 DIAGNOSIS — Z7901 Long term (current) use of anticoagulants: Secondary | ICD-10-CM

## 2014-05-15 DIAGNOSIS — Z5181 Encounter for therapeutic drug level monitoring: Secondary | ICD-10-CM

## 2014-05-15 DIAGNOSIS — Z950 Presence of cardiac pacemaker: Secondary | ICD-10-CM

## 2014-05-15 LAB — POCT INR: INR: 3.9

## 2014-05-15 NOTE — Patient Instructions (Signed)
  We will see you back in follow up in 6 months with Dr Gwenlyn Found  Dr Gwenlyn Found has ordered: 1.  Echocardiogram. Echocardiography is a painless test that uses sound waves to create images of your heart. It provides your doctor with information about the size and shape of your heart and how well your heart's chambers and valves are working. This procedure takes approximately one hour. There are no restrictions for this procedure.   2. Lexiscan Myoview- this is a test that looks at the blood flow to your heart muscle.  It takes approximately 2 1/2 hours. Please follow instruction sheet, as given.

## 2014-05-15 NOTE — Assessment & Plan Note (Signed)
On statin therapy followed by his PCP 

## 2014-05-15 NOTE — Assessment & Plan Note (Signed)
History of coronary artery bypass grafting in 2001. Cardiac catheterization performed by Dr. Melvern Banker 03/28/08 revealed normal LV systolic function with patent grafts. He had a nonischemic Myoview 2 years ago. He has complained of increasing shortness of breath which was his presenting to bypass grafting. I'm going to repeat a 2-D echocardiogram and a pharmacologic Myoview stress test.

## 2014-05-15 NOTE — Assessment & Plan Note (Signed)
Controlled on current medications 

## 2014-05-15 NOTE — Assessment & Plan Note (Signed)
Followed remotely on a quarterly basis. Scheduled to see Dr. Sallyanne Kuster in September

## 2014-05-15 NOTE — Progress Notes (Signed)
05/15/2014 Mike Price   August 05, 1935  [id number]  Primary Physician Price, Mike Bachelor, MD Primary Cardiologist: Mike Harp MD Mike Price   HPI:  The patient is a 79 year old moderately overweight married Caucasian male father of 1 child, grandfather to 49 step-grandchildren who I last saw 5 months ago. He has a history of coronary artery bypass grafting in 2001. He had a permanent transvenous pacemaker placed for PAF and underwent cardioversion by Dr. Tami Price January 24, 2008. His other problems include hypertension and hyperlipidemia. He was cathed by Dr. Melvern Price March 28, 2008 revealing patent grafts with normal LV function. 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 He denies chest pain or shortness of breath. He had a left total hip replacement in October of 2011. Myoview performed a year ago was nonischemic.several months he's noticed increasing dyspnea on exertion. He saw Dr. Nyoka Price, his primary care physician, who referred him back for further evaluation. Apparently shortness of breath as his presenting symptom prior to coronary artery bypass grafting.   Current Outpatient Prescriptions  Medication Sig Dispense Refill  . acetaminophen (TYLENOL) 325 MG tablet Take 2 tablets (650 mg total) by mouth every 6 (six) hours as needed.  60 tablet  0  . aspirin 81 MG tablet Take 81 mg by mouth daily.      [name] Kitchen atorvastatin (LIPITOR) 40 MG tablet Take 40 mg by mouth every evening.       . bisacodyl (DULCOLAX) 10 MG suppository Place 1 suppository (10 mg total) rectally daily as needed.  12 suppository  0  . clonazePAM (KLONOPIN) 0.5 MG tablet Take 0.25-0.5 mg by mouth 3 (three) times daily as needed. Takes 1/2 tablet twice daily then a whole tablet at bedtime      . doxazosin (CARDURA) 2 MG tablet Take 1 mg by mouth at bedtime.      [name] Kitchen ezetimibe (ZETIA) 10 MG tablet Take 10 mg by mouth every evening.       .  ferrous sulfate 325 (65 FE) MG tablet Take 325 mg by mouth every other day. On Monday, Wednesday and Friday      . furosemide (LASIX) 20 MG tablet Take 10 mg by mouth daily.       [name] Kitchen levothyroxine (SYNTHROID, LEVOTHROID) 75 MCG tablet Take 75 mcg by mouth daily before breakfast.       . losartan (COZAAR) 100 MG tablet Take 100 mg by mouth daily.      . metoprolol (LOPRESSOR) 100 MG tablet Take 100 mg by mouth 2 (two) times daily.      . nitrofurantoin (MACRODANTIN) 100 MG capsule Take 100 mg by mouth 2 (two) times daily.       . NON FORMULARY CPAP qhs      . omeprazole (PRILOSEC) 20 MG capsule Take 20 mg by mouth 2 (two) times daily.      . traMADol (ULTRAM) 50 MG tablet Take 50 mg by mouth every 6 (six) hours as needed for pain.      [name] Kitchen warfarin (COUMADIN) 5 MG tablet TAKE ONE AND ONE-HALF TABLETS DAILY OR AS DIRECTED  135 tablet  1   No current facility-administered medications for this visit.    Allergies  Allergen Reactions  . Lisinopril Cough  . Mefloquine Other (See Comments)    "made me crazy" anxious, upset    History   Social History  . Marital Status: Married    Spouse Name: N/A  Number of Children: N/A  . Years of Education: N/A   Occupational History  . Not on file.   Social History Main Topics  . Smoking status: Former Smoker    Quit date: 01/12/1984  . Smokeless tobacco: Not on file  . Alcohol Use: 1.8 oz/week    3 Shots of liquor per week     Comment:  3 ounces daily  . Drug Use: No  . Sexual Activity: Yes   Other Topics Concern  . Not on file   Social History Narrative  . No narrative on file     Review of Systems: General: negative for chills, fever, night sweats or weight changes.  Cardiovascular: negative for chest pain, dyspnea on exertion, edema, orthopnea, palpitations, paroxysmal nocturnal dyspnea or shortness of breath Dermatological: negative for rash Respiratory: negative for cough or wheezing Urologic: negative for hematuria Abdominal:  negative for nausea, vomiting, diarrhea, bright red blood per rectum, melena, or hematemesis Neurologic: negative for visual changes, syncope, or dizziness All other systems reviewed and are otherwise negative except as noted above.    Blood pressure 154/72, pulse 74, height 5\' 11"  (1.803 m), weight 250 lb (113.399 kg).  General appearance: alert and no distress Neck: no adenopathy, no carotid bruit, no JVD, supple, symmetrical, trachea midline and thyroid not enlarged, symmetric, no tenderness/mass/nodules Lungs: clear to auscultation bilaterally Heart: regular rate and rhythm, S1, S2 normal, no murmur, click, rub or gallop Extremities: extremities normal, atraumatic, no cyanosis or edema  EKG ventricular paced rhythm, A. Fib underlying  ASSESSMENT AND PLAN:   Atrial fibrillation On Coumadin anticoagulation followed in our clinic  CAD (coronary artery disease) of artery bypass graft History of coronary artery bypass grafting in 2001. Cardiac catheterization performed by Dr. Melvern Price 03/28/08 revealed normal LV systolic function with patent grafts. He had a nonischemic Myoview 2 years ago. He has complained of increasing shortness of breath which was his presenting to bypass grafting. I'm going to repeat a 2-D echocardiogram and a pharmacologic Myoview stress test.  HTN (hypertension) Controlled on current medications  Hyperlipidemia On statin therapy followed by his PCP  Pacemaker Followed remotely on a quarterly basis. Scheduled to see Dr. Sallyanne Price in September      Mike Harp MD [facility], [facility] 05/15/2014 12:51 PM

## 2014-05-15 NOTE — Assessment & Plan Note (Signed)
On Coumadin anticoagulation followed in our clinic

## 2014-05-23 ENCOUNTER — Telehealth (HOSPITAL_COMMUNITY): Payer: Self-pay

## 2014-05-23 ENCOUNTER — Encounter (HOSPITAL_COMMUNITY): Payer: Medicare Other

## 2014-05-23 NOTE — Telephone Encounter (Signed)
Encounter complete. 

## 2014-05-25 ENCOUNTER — Ambulatory Visit (HOSPITAL_COMMUNITY)
Admission: RE | Admit: 2014-05-25 | Discharge: 2014-05-25 | Disposition: A | Discharge status: Home / Self Care | Payer: Medicare Other | Source: Ambulatory Visit | Attending: Cardiovascular Disease | Admitting: Cardiovascular Disease

## 2014-05-25 ENCOUNTER — Telehealth (HOSPITAL_COMMUNITY): Payer: Self-pay

## 2014-05-25 ENCOUNTER — Telehealth: Payer: Self-pay | Admitting: Cardiovascular Disease

## 2014-05-25 ENCOUNTER — Ambulatory Visit (HOSPITAL_BASED_OUTPATIENT_CLINIC_OR_DEPARTMENT_OTHER)
Admission: RE | Admit: 2014-05-25 | Discharge: 2014-05-25 | Disposition: A | Discharge status: Home / Self Care | Payer: Medicare Other | Source: Ambulatory Visit | Attending: Cardiology | Admitting: Cardiology

## 2014-05-25 DIAGNOSIS — I25810 Atherosclerosis of coronary artery bypass graft(s) without angina pectoris: Secondary | ICD-10-CM

## 2014-05-25 DIAGNOSIS — R0602 Shortness of breath: Secondary | ICD-10-CM

## 2014-05-25 DIAGNOSIS — I4891 Unspecified atrial fibrillation: Secondary | ICD-10-CM | POA: Insufficient documentation

## 2014-05-25 DIAGNOSIS — R0609 Other forms of dyspnea: Secondary | ICD-10-CM | POA: Diagnosis present

## 2014-05-25 DIAGNOSIS — R0989 Other specified symptoms and signs involving the circulatory and respiratory systems: Principal | ICD-10-CM | POA: Insufficient documentation

## 2014-05-25 DIAGNOSIS — I059 Rheumatic mitral valve disease, unspecified: Secondary | ICD-10-CM

## 2014-05-25 MED ORDER — TECHNETIUM TC 99M SESTAMIBI GENERIC - CARDIOLITE
10.9000 | Freq: Once | INTRAVENOUS | Status: AC | PRN
  Administered 2014-05-25: 10.9 via INTRAVENOUS

## 2014-05-25 MED ORDER — TECHNETIUM TC 99M SESTAMIBI GENERIC - CARDIOLITE
30.4000 | Freq: Once | INTRAVENOUS | Status: AC | PRN
  Administered 2014-05-25: 30 via INTRAVENOUS

## 2014-05-25 MED ORDER — AMINOPHYLLINE 25 MG/ML IV SOLN
75.0000 mg | Freq: Once | INTRAVENOUS | Status: AC
  Administered 2014-05-25: 75 mg via INTRAVENOUS

## 2014-05-25 MED ORDER — REGADENOSON 0.4 MG/5ML IV SOLN
0.4000 mg | Freq: Once | INTRAVENOUS | Status: AC
  Administered 2014-05-25: 0.4 mg via INTRAVENOUS

## 2014-05-25 NOTE — Telephone Encounter (Signed)
Pt called in concerned about taking metoprolol before coming in for his stress test today . He was not instructed on what drugs he could and couldn't take. Please call  Thanks

## 2014-05-25 NOTE — Telephone Encounter (Signed)
Encounter complete. 

## 2014-05-25 NOTE — Procedures (Addendum)
[facility] [address] [place] [address] [phone]  Cardiology Nuclear Med Study  Mike Price is a 79 y.o. male     MRN : [id number]     DOB: October 11, 1934  Procedure Date: 05/25/2014  Nuclear Med Background Indication for Stress Test:  Graft Patency History:  CAD;CABG X4-2001;PACER;PAF w/cardioversion on 01/24/2008;Last NUC MPI on 02/12/2012-nonischemic;EF-not graded Cardiac Risk Factors: Family History - CAD, History of Smoking, Hypertension, Lipids and Obesity  Symptoms:  DOE and Fatigue   Nuclear Pre-Procedure Caffeine/Decaff Intake:  12:00am NPO After: 10am   IV Site: R Forearm  IV 0.9% NS with Angio Cath:  22g  Chest Size (in):  50"  IV Started by: Rolene Course, RN  Height: 5\' 11"  (1.803 m)  Cup Size: n/a  BMI:  Body mass index is 34.88 kg/(m^2). Weight:  250 lb (113.399 kg)   Tech Comments:  n/a    Nuclear Med Study 1 or 2 day study: 1 day  Stress Test Type:  Kathryn Provider:  Quay Burow, MD   Resting Radionuclide: Technetium 44m Sestamibi  Resting Radionuclide Dose: 10.9 mCi   Stress Radionuclide:  Technetium 16m Sestamibi  Stress Radionuclide Dose: 30.4 mCi           Stress Protocol Rest HR: 65 Stress HR: 66  Rest BP: 155/85 Stress BP: 162/78  Exercise Time (min): n/a METS: n/a   Predicted Max HR: 143 bpm % Max HR: 47.55 bpm Rate Pressure Product: 11016  Dose of Adenosine (mg):  n/a Dose of Lexiscan: 0.4 mg  Dose of Atropine (mg): n/a Dose of Dobutamine: n/a mcg/kg/min (at max HR)  Stress Test Technologist: Leane Para, CCT Nuclear Technologist: Imagene Riches, CNMT   Rest Procedure:  Myocardial perfusion imaging was performed at rest 45 minutes following the intravenous administration of Technetium 75m Sestamibi. Stress Procedure:  The patient received IV Lexiscan 0.4 mg over 15-seconds.  Technetium 91m Sestamibi injected IV at 30-seconds. Patient experienced  SOB, Neck Pressure/Heaviness and 75 mg Aminophylline IV was administered. There were no significant changes with Lexiscan.  Quantitative spect images were obtained after a 45 minute delay.  Transient Ischemic Dilatation (Normal <1.22):  1.14  QGS EDV:  141 ml QGS ESV:  62 ml LV Ejection Fraction: 56%     Rest ECG: Atrial Fibrilliation and ventricular paced rhythm  Stress ECG: No significant change from baseline ECG  QPS Raw Data Images:  Normal; no motion artifact; normal heart/lung ratio. Stress Images:  There is decreased uptake in the apex. Rest Images:  Comparison with the stress images reveals no significant change. Subtraction (SDS):  There is a fixed defect that is most consistent with a previous infarction. LV Wall Motion:  Normal overall systolic function with marked contractile asynchrony due to ventricular pacing and apical dyskinesis  Impression Exercise Capacity:  Lexiscan with no exercise. BP Response:  Hypertensive blood pressure response. Clinical Symptoms:  No significant symptoms noted. ECG Impression:  No significant ECG changes with Lexiscan. Comparison with Prior Nuclear Study: No significant change from previous study 2013   Overall Impression:  Low risk stress nuclear study with a small fixed apical defect.   Sanda Klein, MD  05/25/2014 4:12 PM

## 2014-05-25 NOTE — Telephone Encounter (Signed)
Forward to Nuclear testing for answer- Rolene Course RN

## 2014-05-25 NOTE — Progress Notes (Signed)
2D Echocardiogram Complete.  05/25/2014   Mike Price [place], RDCS

## 2014-05-25 NOTE — Telephone Encounter (Signed)
SPOKE TO WIFE  SHE STATES PATIENT RECEIVED INSTRUCTION CONCERNING MYOVIEW

## 2014-05-26 ENCOUNTER — Ambulatory Visit (INDEPENDENT_AMBULATORY_CARE_PROVIDER_SITE_OTHER): Payer: Medicare Other | Admitting: Pharmacist Clinician (PhC)/ Clinical Pharmacy Specialist

## 2014-05-26 DIAGNOSIS — Z7901 Long term (current) use of anticoagulants: Secondary | ICD-10-CM

## 2014-05-26 DIAGNOSIS — I4891 Unspecified atrial fibrillation: Secondary | ICD-10-CM

## 2014-05-26 LAB — POCT INR: INR: 2.2

## 2014-06-23 ENCOUNTER — Ambulatory Visit (INDEPENDENT_AMBULATORY_CARE_PROVIDER_SITE_OTHER): Payer: Medicare Other | Admitting: Pharmacist Clinician (PhC)/ Clinical Pharmacy Specialist

## 2014-06-23 DIAGNOSIS — I4891 Unspecified atrial fibrillation: Secondary | ICD-10-CM

## 2014-06-23 DIAGNOSIS — Z7901 Long term (current) use of anticoagulants: Secondary | ICD-10-CM

## 2014-06-23 LAB — POCT INR: INR: 2.1

## 2014-06-27 ENCOUNTER — Ambulatory Visit (INDEPENDENT_AMBULATORY_CARE_PROVIDER_SITE_OTHER): Payer: Medicare Other | Admitting: Cardiovascular Disease

## 2014-06-27 ENCOUNTER — Encounter: Payer: Self-pay | Admitting: Cardiovascular Disease

## 2014-06-27 VITALS — BP 132/70 | HR 75 | Resp 16 | Ht 70.0 in | Wt 252.9 lb

## 2014-06-27 DIAGNOSIS — I442 Atrioventricular block, complete: Secondary | ICD-10-CM

## 2014-06-27 DIAGNOSIS — I25810 Atherosclerosis of coronary artery bypass graft(s) without angina pectoris: Secondary | ICD-10-CM

## 2014-06-27 DIAGNOSIS — I4819 Other persistent atrial fibrillation: Secondary | ICD-10-CM

## 2014-06-27 DIAGNOSIS — I4891 Unspecified atrial fibrillation: Secondary | ICD-10-CM

## 2014-06-27 LAB — MDC_IDC_ENUM_SESS_TYPE_INCLINIC
Battery Impedance: 1983 Ohm
Battery Voltage: 2.74 V
Brady Statistic RV Percent Paced: 98 %
Lead Channel Setting Pacing Amplitude: 2.5 V
Lead Channel Setting Pacing Pulse Width: 0.4 ms
Lead Channel Setting Sensing Sensitivity: 5.6 mV
MDC IDC MSMT BATTERY REMAINING LONGEVITY: 27 mo
MDC IDC MSMT LEADCHNL RA IMPEDANCE VALUE: 67 Ohm
MDC IDC MSMT LEADCHNL RV IMPEDANCE VALUE: 703 Ohm
MDC IDC MSMT LEADCHNL RV PACING THRESHOLD AMPLITUDE: 0.75 V
MDC IDC MSMT LEADCHNL RV PACING THRESHOLD PULSEWIDTH: 0.4 ms
MDC IDC SESS DTM: 20150922140330

## 2014-06-27 NOTE — Patient Instructions (Addendum)
Remote monitoring is used to monitor your pacemaker from home. This monitoring reduces the number of office visits required to check your device to one time per year. It allows [place] to keep an eye on the functioning of your device to ensure it is working properly. You are scheduled for a device check from home on 09-27-2014. You may send your transmission at any time that day. If you have a wireless device, the transmission will be sent automatically. After your physician reviews your transmission, you will receive a postcard with your next transmission date.  Your physician recommends that you schedule a follow-up appointment in: 12 months with Dr.Croitoru + pacemaker check

## 2014-06-27 NOTE — Progress Notes (Signed)
Patient ID: Mike Price, male   DOB: 1935-01-29, 79 y.o.   MRN: [id number]      Reason for office visit atrial fibrillation, pacemaker check  Mike Price is here for pacemaker follow up. He sees Dr. Gwenlyn Found for CAD s/p CABG 2001 and hyperlipidemia. He has had a pacemaker since 2008 (Medtronic) and has previously had cardioversion for atrial fibrillation. He has been in uninterrupted atrial fibrillation for about 16 months now and appears oblivious to the arrhythmia. At one point the arrhythmia appeared organized, flutter-like, but overdrive pacing was unsuccessful. He is anticoagulated, without CVA/TIA or bleeding problems.  Device check is normal. 100% atrial fibrillation , today with very slow ventricular response (essentially complete heart block). Has 62% V pacing and only very brief occasions of RVR, usually very well rate controlled. Lead parameters are normal. Estimated longevity 22 months.    Allergies  Allergen Reactions  . Lisinopril Cough  . Mefloquine Other (See Comments)    "made me crazy" anxious, upset    Current Outpatient Prescriptions  Medication Sig Dispense Refill  . acetaminophen (TYLENOL) 325 MG tablet Take 2 tablets (650 mg total) by mouth every 6 (six) hours as needed.  60 tablet  0  . aspirin 81 MG tablet Take 81 mg by mouth daily.      [name] Kitchen atorvastatin (LIPITOR) 40 MG tablet Take 40 mg by mouth every evening.       . bisacodyl (DULCOLAX) 10 MG suppository Place 1 suppository (10 mg total) rectally daily as needed.  12 suppository  0  . clonazePAM (KLONOPIN) 0.5 MG tablet Take 0.25-0.5 mg by mouth 3 (three) times daily as needed. Takes 1/2 tablet twice daily then a whole tablet at bedtime      . doxazosin (CARDURA) 2 MG tablet Take 1 mg by mouth at bedtime.      [name] Kitchen ezetimibe (ZETIA) 10 MG tablet Take 10 mg by mouth every evening.       . ferrous sulfate 325 (65 FE) MG tablet Take 325 mg by mouth every other day. On Monday, Wednesday and Friday      . furosemide  (LASIX) 20 MG tablet Take 10 mg by mouth daily.       [name] Kitchen losartan (COZAAR) 100 MG tablet Take 100 mg by mouth daily.      . metoprolol (LOPRESSOR) 100 MG tablet Take 100 mg by mouth 2 (two) times daily.      . nitrofurantoin (MACRODANTIN) 100 MG capsule Take 100 mg by mouth 2 (two) times daily.       . NON FORMULARY CPAP qhs      . omeprazole (PRILOSEC) 20 MG capsule Take 20 mg by mouth 2 (two) times daily.      . traMADol (ULTRAM) 50 MG tablet Take 50 mg by mouth every 6 (six) hours as needed for pain.      [name] Kitchen warfarin (COUMADIN) 5 MG tablet TAKE ONE AND ONE-HALF TABLETS DAILY OR AS DIRECTED  135 tablet  1  . levothyroxine (SYNTHROID, LEVOTHROID) 75 MCG tablet Take 75 mcg by mouth daily before breakfast.        No current facility-administered medications for this visit.    Past Medical History  Diagnosis Date  . Hypertension   . Thyroid disease   . TMJ tenderness 01-11-13    right side-has issues from time to time.  . Pacemaker 12/15/06    3'08  . Dysrhythmia 01-11-13     hx. A. Fib-Pacemaker implanted left chest  .  Hypothyroidism   . Anxiety 01-11-13    tx. Clonazepam.  . Edema extremities 01-11-13    retains fluid in legs-right greater than left.  . Neuromuscular disorder     neuropathy in feet  . GERD (gastroesophageal reflux disease)   . Arthritis     Osteoarthritis-knees, fingers  . Anemia   . CAD (coronary artery disease)   . Hyperlipemia     Past Surgical History  Procedure Laterality Date  . Coronary artery bypass graft  2001    4 vessels-'00  . Tonsillectomy      child  . Appendectomy    . Insert / replace / remove pacemaker      '08-inserted left chest  . Cervical laminectomy    . Hernia repair    . Joint replacement      LTHA  . Total knee arthroplasty Right 01/17/2013    Procedure: RIGHT TOTAL KNEE ARTHROPLASTY;  Surgeon: Gearlean Alf, MD;  Location: [facility];  Service: Orthopedics;  Laterality: Right;  . Cardiac catheterization  03/28/08    patent grafts, nl  EF  . Pacemaker insertion  12/15/06    medtronic  . US echocardiography  12/28/2007    LA mod. dilated,RA mildly dilated  . Nm myocar perf wall motion  02/12/2012    small fixed distal anteroapical/apical defect. No reversible ischemia    No family history on file.  History   Social History  . Marital Status: Married    Spouse Name: N/A    Number of Children: N/A  . Years of Education: N/A   Occupational History  . Not on file.   Social History Main Topics  . Smoking status: Former Smoker    Quit date: 01/12/1984  . Smokeless tobacco: Not on file  . Alcohol Use: 1.8 oz/week    3 Shots of liquor per week     Comment:  3 ounces daily  . Drug Use: No  . Sexual Activity: Yes   Other Topics Concern  . Not on file   Social History Narrative  . No narrative on file    Review of systems: The patient specifically denies any chest pain at rest or with exertion, dyspnea at rest or with exertion, orthopnea, paroxysmal nocturnal dyspnea, syncope, palpitations, focal neurological deficits, intermittent claudication, lower extremity edema, unexplained weight gain, cough, hemoptysis or wheezing.  The patient also denies abdominal pain, nausea, vomiting, dysphagia, diarrhea, constipation, polyuria, polydipsia, dysuria, hematuria, frequency, urgency, abnormal bleeding or bruising, fever, chills, unexpected weight changes, mood swings, change in skin or hair texture, change in voice quality, auditory or visual problems, allergic reactions or rashes, new musculoskeletal complaints other than usual "aches and pains".   PHYSICAL EXAM BP 132/70  Pulse 75  Resp 16  Ht 5\' 10"  (1.778 m)  Wt 114.715 kg (252 lb 14.4 oz)  BMI 36.29 kg/m2  General: Alert, oriented x3, no distress Head: no evidence of trauma, PERRL, EOMI, no exophtalmos or lid lag, no myxedema, no xanthelasma; normal ears, nose and oropharynx Neck: normal jugular venous pulsations and no hepatojugular reflux; brisk carotid pulses  without delay and no carotid bruits Chest: clear to auscultation, no signs of consolidation by percussion or palpation, normal fremitus, symmetrical and full respiratory excursions Cardiovascular: normal position and quality of the apical impulse, regular rhythm, normal first and second heart sounds, no murmurs, rubs or gallops Abdomen: no tenderness or distention, no masses by palpation, no abnormal pulsatility or arterial bruits, normal bowel sounds, no hepatosplenomegaly Extremities: no  clubbing, cyanosis or edema; 2+ radial, ulnar and brachial pulses bilaterally; 2+ right femoral, posterior tibial and dorsalis pedis pulses; 2+ left femoral, posterior tibial and dorsalis pedis pulses; no subclavian or femoral bruits Neurological: grossly nonfocal  BMET    Component Value Date/Time   NA 136 01/19/2013 0435   K 4.2 01/19/2013 0435   CL 100 01/19/2013 0435   CO2 29 01/19/2013 0435   GLUCOSE 168* 01/19/2013 0435   BUN 15 01/19/2013 0435   CREATININE 0.77 01/19/2013 0435   CALCIUM 9.1 01/19/2013 0435   GFRNONAA 86* 01/19/2013 0435   GFRAA >90 01/19/2013 0435     ASSESSMENT AND PLAN  Normal pacemaker function. Atrial fibrillation appears to be permanent. Switched to VVIR mode. Continue warfarin and consider reducing beta blocker dose to reduce V pacing frequency. Continue Q3 months [employer] monitoring.  Orders Placed This Encounter  Procedures  . Implantable device check   Patient Instructions  Remote monitoring is used to monitor your pacemaker from home. This monitoring reduces the number of office visits required to check your device to one time per year. It allows [place] to keep an eye on the functioning of your device to ensure it is working properly. You are scheduled for a device check from home on 09-27-2014. You may send your transmission at any time that day. If you have a wireless device, the transmission will be sent automatically. After your physician reviews your transmission, you will  receive a postcard with your next transmission date.  Your physician recommends that you schedule a follow-up appointment in: 12 months with Dr.Eulah Walkup + pacemaker check    Wynonna Fitzhenry  Sanda Klein, MD, [place] [phone] office [phone] pager

## 2014-07-03 ENCOUNTER — Other Ambulatory Visit: Payer: Self-pay | Admitting: Pharmacist Clinician (PhC)/ Clinical Pharmacy Specialist

## 2014-07-03 MED ORDER — WARFARIN SODIUM 5 MG PO TABS: ORAL_TABLET | ORAL | Status: DC

## 2014-07-07 ENCOUNTER — Encounter: Payer: Self-pay | Admitting: Cardiovascular Disease

## 2014-07-12 ENCOUNTER — Telehealth: Payer: Self-pay | Admitting: Cardiovascular Disease

## 2014-07-12 NOTE — Telephone Encounter (Signed)
Please call asap,can not get up with [facility].Question about medicine,does not want it to interfer with his Coumadin.

## 2014-07-12 NOTE — Telephone Encounter (Signed)
Returned call to patient. He saw PCP yesterday and he thinks patient has diverticulitis. He prescribed patient CIPROFLOXACIN 500MG  BID x 7 days and FLAGYL 500MG  BID x 7 days.   Per Erasmo Downer, patient to take antibiotics as directed and HOLD warfarin tomorrow Thursday October 8th, repeat INR on Monday 10/12 (appointment made for 7:40am)  Patient voiced understanding.

## 2014-07-17 ENCOUNTER — Ambulatory Visit (INDEPENDENT_AMBULATORY_CARE_PROVIDER_SITE_OTHER): Payer: Medicare Other | Admitting: Pharmacist Clinician (PhC)/ Clinical Pharmacy Specialist

## 2014-07-17 DIAGNOSIS — I4891 Unspecified atrial fibrillation: Secondary | ICD-10-CM

## 2014-07-17 DIAGNOSIS — Z7901 Long term (current) use of anticoagulants: Secondary | ICD-10-CM

## 2014-07-17 LAB — POCT INR: INR: 1.9

## 2014-07-21 ENCOUNTER — Ambulatory Visit: Payer: Medicare Other | Admitting: Pharmacist Clinician (PhC)/ Clinical Pharmacy Specialist

## 2014-08-07 ENCOUNTER — Ambulatory Visit (INDEPENDENT_AMBULATORY_CARE_PROVIDER_SITE_OTHER): Payer: Medicare Other | Admitting: Pharmacist Clinician (PhC)/ Clinical Pharmacy Specialist

## 2014-08-07 DIAGNOSIS — Z7901 Long term (current) use of anticoagulants: Secondary | ICD-10-CM

## 2014-08-07 DIAGNOSIS — I4891 Unspecified atrial fibrillation: Secondary | ICD-10-CM

## 2014-08-07 LAB — POCT INR: INR: 2.6

## 2014-09-04 ENCOUNTER — Ambulatory Visit (INDEPENDENT_AMBULATORY_CARE_PROVIDER_SITE_OTHER): Payer: Medicare Other | Admitting: Pharmacist Clinician (PhC)/ Clinical Pharmacy Specialist

## 2014-09-04 DIAGNOSIS — I4891 Unspecified atrial fibrillation: Secondary | ICD-10-CM

## 2014-09-04 DIAGNOSIS — Z7901 Long term (current) use of anticoagulants: Secondary | ICD-10-CM

## 2014-09-04 LAB — POCT INR: INR: 2.7

## 2014-09-27 ENCOUNTER — Ambulatory Visit (INDEPENDENT_AMBULATORY_CARE_PROVIDER_SITE_OTHER): Payer: Medicare Other | Admitting: *Deleted

## 2014-09-27 ENCOUNTER — Telehealth: Payer: Self-pay | Admitting: Cardiology

## 2014-09-27 DIAGNOSIS — I442 Atrioventricular block, complete: Secondary | ICD-10-CM

## 2014-09-27 LAB — MDC_IDC_ENUM_SESS_TYPE_REMOTE
Battery Impedance: 2422 Ohm
Brady Statistic RV Percent Paced: 100 %
Date Time Interrogation Session: 20151223173336
Lead Channel Impedance Value: 67 Ohm
Lead Channel Impedance Value: 691 Ohm
Lead Channel Pacing Threshold Amplitude: 1 V
Lead Channel Setting Pacing Amplitude: 2.5 V
Lead Channel Setting Sensing Sensitivity: 5.6 mV
MDC IDC MSMT BATTERY REMAINING LONGEVITY: 22 mo
MDC IDC MSMT BATTERY VOLTAGE: 2.72 V
MDC IDC MSMT LEADCHNL RV PACING THRESHOLD PULSEWIDTH: 0.4 ms
MDC IDC SET LEADCHNL RV PACING PULSEWIDTH: 0.4 ms

## 2014-09-27 NOTE — Telephone Encounter (Signed)
Spoke with pt and reminded pt of remote transmission that is due today. Pt verbalized understanding.   

## 2014-09-27 NOTE — Progress Notes (Signed)
Remote pacemaker transmission.   

## 2014-10-04 ENCOUNTER — Ambulatory Visit (INDEPENDENT_AMBULATORY_CARE_PROVIDER_SITE_OTHER): Payer: Medicare Other | Admitting: Pharmacist Clinician (PhC)/ Clinical Pharmacy Specialist

## 2014-10-04 DIAGNOSIS — Z7901 Long term (current) use of anticoagulants: Secondary | ICD-10-CM

## 2014-10-04 DIAGNOSIS — I4891 Unspecified atrial fibrillation: Secondary | ICD-10-CM

## 2014-10-04 LAB — POCT INR: INR: 3.6

## 2014-10-09 ENCOUNTER — Encounter: Payer: Self-pay | Admitting: Cardiology

## 2014-10-19 ENCOUNTER — Encounter: Payer: Self-pay | Admitting: Cardiovascular Disease

## 2014-10-25 ENCOUNTER — Ambulatory Visit (INDEPENDENT_AMBULATORY_CARE_PROVIDER_SITE_OTHER): Payer: Medicare Other | Admitting: Pharmacist Clinician (PhC)/ Clinical Pharmacy Specialist

## 2014-10-25 DIAGNOSIS — I4891 Unspecified atrial fibrillation: Secondary | ICD-10-CM

## 2014-10-25 DIAGNOSIS — Z7901 Long term (current) use of anticoagulants: Secondary | ICD-10-CM

## 2014-10-25 LAB — POCT INR: INR: 4.4

## 2014-11-07 ENCOUNTER — Ambulatory Visit (INDEPENDENT_AMBULATORY_CARE_PROVIDER_SITE_OTHER): Payer: Medicare Other | Admitting: Cardiovascular Disease

## 2014-11-07 ENCOUNTER — Ambulatory Visit (INDEPENDENT_AMBULATORY_CARE_PROVIDER_SITE_OTHER): Payer: Medicare Other | Admitting: *Deleted

## 2014-11-07 ENCOUNTER — Encounter: Payer: Self-pay | Admitting: Cardiovascular Disease

## 2014-11-07 VITALS — BP 122/76 | HR 78 | Ht 71.0 in | Wt 251.9 lb

## 2014-11-07 DIAGNOSIS — Z950 Presence of cardiac pacemaker: Secondary | ICD-10-CM

## 2014-11-07 DIAGNOSIS — I4891 Unspecified atrial fibrillation: Secondary | ICD-10-CM

## 2014-11-07 DIAGNOSIS — I482 Chronic atrial fibrillation, unspecified: Secondary | ICD-10-CM

## 2014-11-07 DIAGNOSIS — E785 Hyperlipidemia, unspecified: Secondary | ICD-10-CM

## 2014-11-07 DIAGNOSIS — I25700 Atherosclerosis of coronary artery bypass graft(s), unspecified, with unstable angina pectoris: Secondary | ICD-10-CM

## 2014-11-07 DIAGNOSIS — Z7901 Long term (current) use of anticoagulants: Secondary | ICD-10-CM

## 2014-11-07 DIAGNOSIS — I10 Essential (primary) hypertension: Secondary | ICD-10-CM

## 2014-11-07 LAB — POCT INR: INR: 2.1

## 2014-11-07 NOTE — Assessment & Plan Note (Signed)
History of permanent transvenous pacemaker followed by Dr. Croitoru. 

## 2014-11-07 NOTE — Assessment & Plan Note (Signed)
History of A. Fib on Coumadin anticoagulation followed here in our office. He does have a permanent transvenous pacemaker.

## 2014-11-07 NOTE — Progress Notes (Signed)
11/07/2014 Mike Price   1934-12-02  [id number]  Primary Physician [name], Mike Bachelor, MD Primary Cardiologist: Mike Harp MD Mike Price   HPI:  The patient is a 80 year old moderately overweight married Caucasian male father of 1 child, grandfather to 55 step-grandchildren who I last saw 4 months ago. He has a history of coronary artery bypass grafting in 2001. He had a permanent transvenous pacemaker placed for PAF and underwent cardioversion by Dr. Tami Price January 24, 2008. His other problems include hypertension and hyperlipidemia. He was cathed by Dr. Melvern Price March 28, 2008 revealing patent grafts with normal LV function. 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 He denies chest pain or shortness of breath. He had a left total hip replacement in October of 2011. Myoview performed a year ago was nonischemic.several months he's noticed increasing dyspnea on exertion. He saw Dr. Nyoka Price, his primary care physician, who referred him back for further evaluation. Apparently shortness of breath as his presenting symptom prior to coronary artery bypass grafting.a Myoview stress test performed 05/25/14 was low risk and nonischemic. His symptoms have somewhat improved. He is now on C Pap for obstructive sleep apnea.   Current Outpatient Prescriptions  Medication Sig Dispense Refill  . acetaminophen (TYLENOL) 325 MG tablet Take 2 tablets (650 mg total) by mouth every 6 (six) hours as needed. 60 tablet 0  . aspirin 81 MG tablet Take 81 mg by mouth daily.    [name] Kitchen atorvastatin (LIPITOR) 40 MG tablet Take 40 mg by mouth every evening.     . bisacodyl (DULCOLAX) 10 MG suppository Place 1 suppository (10 mg total) rectally daily as needed. 12 suppository 0  . clonazePAM (KLONOPIN) 0.5 MG tablet Take 0.25-0.5 mg by mouth 3 (three) times daily as needed. Takes 1/2 tablet twice daily then a whole tablet at bedtime    .  doxazosin (CARDURA) 2 MG tablet Take 1 mg by mouth at bedtime.    [name] Kitchen ezetimibe (ZETIA) 10 MG tablet Take 10 mg by mouth every evening.     . ferrous sulfate 325 (65 FE) MG tablet Take 325 mg by mouth every other day. On Monday, Wednesday and Friday    . furosemide (LASIX) 20 MG tablet Take 10 mg by mouth daily.     [name] Kitchen levothyroxine (SYNTHROID, LEVOTHROID) 75 MCG tablet Take 75 mcg by mouth daily before breakfast.     . losartan (COZAAR) 100 MG tablet Take 100 mg by mouth daily.    . metoprolol (LOPRESSOR) 100 MG tablet Take 100 mg by mouth 2 (two) times daily.    . metroNIDAZOLE (FLAGYL) 500 MG tablet Take 500 mg by mouth 2 (two) times daily.    . nitrofurantoin (MACRODANTIN) 100 MG capsule Take 100 mg by mouth 2 (two) times daily.     . NON FORMULARY CPAP qhs    . omeprazole (PRILOSEC) 20 MG capsule Take 20 mg by mouth 2 (two) times daily.    . traMADol (ULTRAM) 50 MG tablet Take 50 mg by mouth every 6 (six) hours as needed for pain.    [name] Kitchen warfarin (COUMADIN) 5 MG tablet Take 1 to 1.5 tablets by mouth daily as directed by coumadin clinic 120 tablet 1   No current facility-administered medications for this visit.    Allergies  Allergen Reactions  . Lisinopril Cough  . Mefloquine Other (See Comments)    "made me crazy" anxious, upset    History   Social History  .  Marital Status: Married    Spouse Name: N/A    Number of Children: N/A  . Years of Education: N/A   Occupational History  . Not on file.   Social History Main Topics  . Smoking status: Former Smoker    Quit date: 01/12/1984  . Smokeless tobacco: Not on file  . Alcohol Use: 1.8 oz/week    3 Shots of liquor per week     Comment:  3 ounces daily  . Drug Use: No  . Sexual Activity: Yes   Other Topics Concern  . Not on file   Social History Narrative     Review of Systems: General: negative for chills, fever, night sweats or weight changes.  Cardiovascular: negative for chest pain, dyspnea on exertion, edema,  orthopnea, palpitations, paroxysmal nocturnal dyspnea or shortness of breath Dermatological: negative for rash Respiratory: negative for cough or wheezing Urologic: negative for hematuria Abdominal: negative for nausea, vomiting, diarrhea, bright red blood per rectum, melena, or hematemesis Neurologic: negative for visual changes, syncope, or dizziness All other systems reviewed and are otherwise negative except as noted above.    Blood pressure 122/76, pulse 78, height 5\' 11"  (1.803 m), weight 251 lb 14.4 oz (114.261 kg).  General appearance: alert and no distress Neck: no adenopathy, no carotid bruit, no JVD, supple, symmetrical, trachea midline and thyroid not enlarged, symmetric, no tenderness/mass/nodules Lungs: clear to auscultation bilaterally Heart: regular rate and rhythm, S1, S2 normal, no murmur, click, rub or gallop Extremities: extremities normal, atraumatic, no cyanosis or edema  EKG paced rhythm at 78. I presume reviewed this EKG  ASSESSMENT AND PLAN:   Pacemaker History of permanent transvenous pacemaker followed by Dr. Sallyanne Price.   Hyperlipidemia History of hyperlipidemia on Zetia followed by his PCP   HTN (hypertension) History of hypertension with blood pressure measures 122/76. He is on metoprolol and losartan. Continue current meds at current dosing   CAD (coronary artery disease) of artery bypass graft History of CAD status post coronary artery bypass grafting in 2001. He had a Myoview performed in 05/25/14 which was low risk and nonischemic. He denies chest pain or shortness of breath.   Atrial fibrillation History of A. Fib on Coumadin anticoagulation followed here in our office. He does have a permanent transvenous pacemaker.       Mike Harp MD FACP,FACC,FAHA, [facility] 11/07/2014 10:58 AM

## 2014-11-07 NOTE — Assessment & Plan Note (Signed)
History of CAD status post coronary artery bypass grafting in 2001. He had a Myoview performed in 05/25/14 which was low risk and nonischemic. He denies chest pain or shortness of breath.

## 2014-11-07 NOTE — Assessment & Plan Note (Signed)
History of hypertension with blood pressure measures 122/76. He is on metoprolol and losartan. Continue current meds at current dosing

## 2014-11-07 NOTE — Assessment & Plan Note (Signed)
History of hyperlipidemia on Zetia followed by his PCP 

## 2014-11-07 NOTE — Patient Instructions (Signed)
We request that you follow-up in: 6 months with an extender and in 12 months with Dr Berry  You will receive a reminder letter in the mail two months in advance. If you don't receive a letter, please call our office to schedule the follow-up appointment.   

## 2014-11-08 ENCOUNTER — Ambulatory Visit: Payer: Medicare Other | Admitting: Pharmacist Clinician (PhC)/ Clinical Pharmacy Specialist

## 2014-11-08 LAB — POCT INR: INR: 2.1

## 2014-11-29 ENCOUNTER — Ambulatory Visit (INDEPENDENT_AMBULATORY_CARE_PROVIDER_SITE_OTHER): Payer: Medicare Other | Admitting: Pharmacist Clinician (PhC)/ Clinical Pharmacy Specialist

## 2014-11-29 DIAGNOSIS — I4891 Unspecified atrial fibrillation: Secondary | ICD-10-CM

## 2014-11-29 DIAGNOSIS — Z7901 Long term (current) use of anticoagulants: Secondary | ICD-10-CM

## 2014-11-29 LAB — POCT INR: INR: 2.2

## 2014-12-05 ENCOUNTER — Encounter: Payer: Self-pay | Admitting: Physical Therapy

## 2014-12-05 ENCOUNTER — Ambulatory Visit: Payer: Medicare Other | Admitting: Occupational Therapy

## 2014-12-05 ENCOUNTER — Ambulatory Visit: Payer: Medicare Other | Attending: Internal Medicine | Admitting: Physical Therapy

## 2014-12-05 DIAGNOSIS — R531 Weakness: Secondary | ICD-10-CM

## 2014-12-05 DIAGNOSIS — R29818 Other symptoms and signs involving the nervous system: Secondary | ICD-10-CM | POA: Diagnosis not present

## 2014-12-05 DIAGNOSIS — R279 Unspecified lack of coordination: Secondary | ICD-10-CM | POA: Insufficient documentation

## 2014-12-05 DIAGNOSIS — Z7409 Other reduced mobility: Secondary | ICD-10-CM

## 2014-12-05 DIAGNOSIS — R269 Unspecified abnormalities of gait and mobility: Secondary | ICD-10-CM

## 2014-12-05 DIAGNOSIS — R278 Other lack of coordination: Secondary | ICD-10-CM

## 2014-12-05 DIAGNOSIS — M259 Joint disorder, unspecified: Secondary | ICD-10-CM | POA: Diagnosis not present

## 2014-12-05 DIAGNOSIS — R29898 Other symptoms and signs involving the musculoskeletal system: Secondary | ICD-10-CM

## 2014-12-05 DIAGNOSIS — R2689 Other abnormalities of gait and mobility: Secondary | ICD-10-CM

## 2014-12-05 NOTE — Therapy (Signed)
[facility] [address] [place], [name], [address] Phone: [phone]   Fax:  [phone]  Occupational Therapy Evaluation  Patient Details  Name: Mike Price MRN: [id number] Date of Birth: 1935/06/24 Referring Provider:  Criselda Peaches, MD  Encounter Date: 12/05/2014      OT End of Session - 12/05/14 1658    Visit Number 1   Number of Visits 17   Date for OT Re-Evaluation 01/30/15   Authorization Type Medicare, [employer] secondary   Authorization Time Period 60 days-01/30/15   Authorization - Visit Number 1   Authorization - Number of Visits 10   OT Start Time 1318   OT Stop Time 1400   OT Time Calculation (min) 42 min   Activity Tolerance Patient tolerated treatment well   Behavior During Therapy [facility] for tasks assessed/performed      Past Medical History  Diagnosis Date  . Hypertension   . Thyroid disease   . TMJ tenderness 01-11-13    right side-has issues from time to time.  . Pacemaker 12/15/06    3'08  . Dysrhythmia 01-11-13     hx. A. Fib-Pacemaker implanted left chest  . Hypothyroidism   . Anxiety 01-11-13    tx. Clonazepam.  . Edema extremities 01-11-13    retains fluid in legs-right greater than left.  . Neuromuscular disorder     neuropathy in feet  . GERD (gastroesophageal reflux disease)   . Arthritis     Osteoarthritis-knees, fingers  . Anemia   . CAD (coronary artery disease)   . Hyperlipemia   . Obstructive sleep apnea     on C Pap    Past Surgical History  Procedure Laterality Date  . Coronary artery bypass graft  2001    4 vessels-'00  . Tonsillectomy      child  . Appendectomy    . Insert / replace / remove pacemaker      '08-inserted left chest  . Cervical laminectomy    . Hernia repair    . Joint replacement      LTHA  . Total knee arthroplasty Right 01/17/2013    Procedure: RIGHT TOTAL KNEE ARTHROPLASTY;  Surgeon: Gearlean Alf, MD;  Location: [facility];  Service:  Orthopedics;  Laterality: Right;  . Cardiac catheterization  03/28/08    patent grafts, nl EF  . Pacemaker insertion  12/15/06    medtronic  . US echocardiography  12/28/2007    LA mod. dilated,RA mildly dilated  . Nm myocar perf wall motion  02/12/2012    small fixed distal anteroapical/apical defect. No reversible ischemia    There were no vitals taken for this visit.  Visit Diagnosis:  Decreased coordination  Balance problems  Impaired functional mobility and activity tolerance  Weakness generalized      Subjective Assessment - 12/05/14 1325    Symptoms Pt reports knee pain    Currently in Pain? Yes   Pain Location Knee   Pain Orientation Right   Pain Descriptors / Indicators Aching   Pain Type Chronic pain   Aggravating Factors  laying on his back, walking and standing aggravates   Pain Relieving Factors rest   Multiple Pain Sites No          [facility] OT Assessment - 12/05/14 1328    Assessment   Diagnosis Neuropathy, hx of CBG and pacemaker placement   Onset Date 11/21/14  Pt referral date, tingling in hands incr. 1 week ago  Assessment Pt reports difficulty with spilling items and writing.   Prior Therapy yes after knee and hip replacements and open heat surgery.   Precautions   Precautions Fall;ICD/Pacemaker  CPAP   Restrictions   Weight Bearing Restrictions No   Balance Screen   Has the patient fallen in the past 6 months No   Has the patient had a decrease in activity level because of a fear of falling?  Yes   Is the patient reluctant to leave their home because of a fear of falling?  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 ~30 minutes, steps into [employer] (takes seated rest breaks)   ADL   Eating/Feeding Independent   Grooming Independent   Upper Body Bathing Modified independent   Lower Body Bathing Modified independent  holds grab bar   Upper Body Dressing Needs assist for fasteners;Minimal assistance  difficulty fastening buttons,    Lower Body Dressing Modified independent  uses long handled shoe horn, sock aide   Where Assessd - Lower Body Dressing Other (comment)  therapist recommends pt sit due to fall risk   Toilet Tranfer Modified independent   Tub/Shower Transfer Supervision/safety  recommended, however pt performs mod I at times   Tree surgeon Walk in shower   Transfers/Ambulation Related to ADL's Pt is at increased fall risk   Mobility   Mobility Status Independent  walks with cane   Written Expression   Dominant Hand Right   Handwriting 100% legible;Mild micrographia  tremor noted with writing   Vision - History   Baseline Vision Wears glasses all the time   Additional Comments cataracts   Activity Tolerance   Activity Tolerance Tolerates 30 min activity with muliple rests   Activity Tolerance Comments Pt reports he fatigues more quickly now with standing activities.   Cognition   Overall Cognitive Status Within Functional Limits for tasks assessed   Sensation   Light Touch Impaired by gross assessment   Coordination   Gross Motor Movements are Fluid and Coordinated No   Fine Motor Movements are Fluid and Coordinated No   9 Hole Peg Test Right;Left     Right 9 Hole Peg Test 37.03 secs  with 3 drops   Left 9 Hole Peg Test 39.34 secs  with 1 drop   Tremors bilaterally with functional use.   Other grip RUE 66 lbs,LUE  64 lbs   ROM /  Strength   AROM / PROM / Strength AROM;Strength   AROM   Overall AROM Comments mild limitations in bilateral shoulder flexion   Strength   Overall Strength Deficits   Right/Left Shoulder Right;Left   Right Shoulder Flexion 3/5  unable to sustain due to pain, weakness   Left Shoulder Flexion 4/5   Right/Left Elbow Right;Left   Right Elbow Flexion 4/5   Right Elbow Extension 4/5   Left Elbow Flexion 4/5   Left Elbow Extension  4/5   Hand Function   Right Hand Grip (lbs) 66   Left Hand Grip (lbs) 64                         OT Short Term Goals - 2014/12/17 1720    OT SHORT TERM GOAL #1   Title I with HEP   Baseline check 01/04/15   Time 4   Period Weeks   Status New   OT SHORT TERM GOAL #2   Title Pt will demonstrate ability to perform functional reaching in standing x 15 mins without LOB or rest breaks.   Time 4   Period Weeks   Status New   OT SHORT TERM GOAL #3   Title Pt will verbalize understanding of adapted strategies for ADLS/IADLS to increase pt safety, independence and minimize spills   Time 4   Period Weeks   Status New           OT Long Term Goals - 12/17/2014 1723    OT LONG TERM GOAL #1   Title Pt will demonstrate improved standing balance as evidenced by increasing standing functional reach by 2 inches bilaterally(from baseline.)   Baseline check 01/30/15   Time 8   Period Weeks   Status New   OT LONG TERM GOAL #2   Title Pt will demonstrate ability to perfom dynamic functional reaching activities in standing without LOB in prep for improved ADL/IADL performance.   Time 8   Period Weeks   Status New   OT LONG TERM GOAL #3   Title Pt will demonstrate improved fine motor coordination as evidenced by decreasing bilateral 9 hole peg test scores to 35 secs or less without any drops.   Time 8   Period Weeks   Status New   OT LONG TERM GOAL #4   Title Pt will demonstrate ability to write a short paragraph without decrease in letter size and  100% legibility.   Time 8   Period Weeks   Status New   OT LONG TERM GOAL #5   Title I with updated HEP.   Time 8   Period Weeks   Status New               Plan - Dec 17, 2014 1700    Clinical Impression Statement Pt with neuropathy and  hx of CABG, pacemaker placement presents with decreased coordiantion, strenght and activity tolerance which impedes performance of daily activities. Pt can benefiit from skilled occupational therapy to maximize functional independence with ADLs/IADLS.   Rehab Potential Good   Clinical Impairments Affecting Rehab Potential decreased coordination, tremor, decreased strength, decreased activity tolerance   OT Frequency 2x / week  plus eval   OT Duration 8 weeks   Plan check standing functional reach next visit and document, coordination HEP   Consulted and Agree with Plan of Care Patient;Family member/caregiver   Family Member Consulted wife, Leta Speller - 2014-12-17 1729    Functional Assessment Tool Used 9 hole peg test RUE 37.03, LUE 39.34, increased fall risk during ADLs.   Functional Limitation Self care   Self Care Current Status [phone]) At least 20 percent but less than 40 percent impaired, limited or restricted   Self Care Goal Status (E8315) At least 1 percent but less than 20 percent impaired, limited or restricted      Problem List Patient Active Problem List   Diagnosis Date  Noted  . Pacemaker 07/02/2013  . CAD (coronary artery disease) of artery bypass graft 02/16/2013  . HTN (hypertension) 02/16/2013  . Hyperlipidemia 02/16/2013  . Postoperative anemia due to acute blood loss 01/20/2013  . Postop Hyponatremia 01/18/2013  . OA (osteoarthritis) of knee 01/17/2013  . Atrial fibrillation 12/21/2012  . Long term (current) use of anticoagulants 12/21/2012    Andra Matsuo 12/05/2014, 5:34 PM Theone Murdoch, OTR/L [phone] Phone: [phone] 5:34 PM 12/05/2014 [facility] [address] [place], [name], [address] Phone: [phone]   Fax:  [phone]

## 2014-12-05 NOTE — Therapy (Signed)
[facility] [address] [place], [name], [address] Phone: [phone]   Fax:  [phone]  Physical Therapy Evaluation  Patient Details  Name: Mike Price MRN: [id number] Date of Birth: 07-14-1935 Referring Provider:  Criselda Peaches, MD  Encounter Date: 12/05/2014      PT End of Session - 12/05/14 1338    Visit Number 1   Number of Visits 18   Date for PT Re-Evaluation 02/02/15   PT Start Time 1230   PT Stop Time 1315   PT Time Calculation (min) 45 min   Equipment Utilized During Treatment Gait belt   Activity Tolerance Patient tolerated treatment well   Behavior During Therapy [facility] for tasks assessed/performed      Past Medical History  Diagnosis Date  . Hypertension   . Thyroid disease   . TMJ tenderness 01-11-13    right side-has issues from time to time.  . Pacemaker 12/15/06    3'08  . Dysrhythmia 01-11-13     hx. A. Fib-Pacemaker implanted left chest  . Hypothyroidism   . Anxiety 01-11-13    tx. Clonazepam.  . Edema extremities 01-11-13    retains fluid in legs-right greater than left.  . Neuromuscular disorder     neuropathy in feet  . GERD (gastroesophageal reflux disease)   . Arthritis     Osteoarthritis-knees, fingers  . Anemia   . CAD (coronary artery disease)   . Hyperlipemia   . Obstructive sleep apnea     on C Pap    Past Surgical History  Procedure Laterality Date  . Coronary artery bypass graft  2001    4 vessels-'00  . Tonsillectomy      child  . Appendectomy    . Insert / replace / remove pacemaker      '08-inserted left chest  . Cervical laminectomy    . Hernia repair    . Joint replacement      LTHA  . Total knee arthroplasty Right 01/17/2013    Procedure: RIGHT TOTAL KNEE ARTHROPLASTY;  Surgeon: Gearlean Alf, MD;  Location: [facility];  Service: Orthopedics;  Laterality: Right;  . Cardiac catheterization  03/28/08    patent grafts, nl EF  . Pacemaker insertion  12/15/06     medtronic  . US echocardiography  12/28/2007    LA mod. dilated,RA mildly dilated  . Nm myocar perf wall motion  02/12/2012    small fixed distal anteroapical/apical defect. No reversible ischemia    There were no vitals taken for this visit.  Visit Diagnosis:  Abnormality of gait  Balance problems  Ankle weakness  Impaired functional mobility and activity tolerance      Subjective Assessment - 12/05/14 1240    Symptoms this 80yo male presents to PT for gait abnoramlity. He has peripheral neuropathy with balance issues.   Patient Stated Goals To walk better and more stable   Currently in Pain? No/denies          [facility] PT Assessment - 12/05/14 1230    Assessment   Medical Diagnosis Neuropathy Gait Abnormality   Precautions   Precautions Fall   Restrictions   Weight Bearing Restrictions No   Balance Screen   Has the patient fallen in the past 6 months No   Has the patient had a decrease in activity level because of a fear of falling?  Yes   Is the patient reluctant to leave their home because of a fear of falling?  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, steps into [employer]   Overall Cognitive Status Within Functional Limits for tasks assessed   Posture/Postural Control   Posture/Postural Control Postural limitations   Postural Limitations Rounded Shoulders;Forward head;Flexed trunk  wide base   ROM / Strength   AROM / PROM /  Strength AROM;Strength   AROM   Overall AROM  Within functional limits for tasks performed   Right Knee Extension -15   Right Knee Flexion --  WFL   Right Ankle Dorsiflexion 0   Left Ankle Dorsiflexion 0   Strength   Overall Strength Deficits   Overall Strength Comments hips grossly 4/5   Right Ankle Dorsiflexion 3+/5   Right Ankle Plantar Flexion 2-/5   Left Ankle Dorsiflexion 3+/5   Left Ankle Plantar Flexion 2-/5   Transfers   Transfers Sit to Stand;Stand to Sit   Sit to Stand 6: Modified independent (Device/Increase time);With upper extremity assist;With armrests;From chair/3-in-1   Stand to Sit 6: Modified independent (Device/Increase time);With upper extremity assist;With armrests;To chair/3-in-1   Ambulation/Gait   Ambulation/Gait Yes   Ambulation/Gait Assistance 5: Supervision   Ambulation Distance (Feet) 100 Feet   Assistive device Straight cane   Gait Pattern Decreased step length - right;Decreased step length - left;Decreased stride length;Decreased dorsiflexion - right;Decreased dorsiflexion - left;Trunk flexed;Wide base of support  No plantarflexion/push off in terminal stance, rocks to side   Ambulation Surface Level;Indoor   Gait velocity 1.63 ft/sec   Berg Balance Test   Sit to Stand Able to stand  independently using hands   Standing Unsupported Able to stand 2 minutes with supervision   Sitting with Back Unsupported but Feet Supported on Floor or Stool Able to sit safely and securely 2 minutes   Stand to Sit Uses backs of legs against chair to control descent   Transfers Able to transfer safely, definite need of hands   Standing Unsupported with Eyes Closed Needs help to keep from falling   Standing Ubsupported with Feet Together Needs help to attain position and unable to hold for 15 seconds   From Standing, Reach Forward with Outstretched Arm Reaches forward but needs supervision   From Standing Position, Pick up Object from Floor Able to pick up shoe, needs  supervision   From Standing Position, Turn to Look Behind Over each Shoulder Needs supervision when turning   Turn 360 Degrees Needs close supervision or verbal cueing   Standing Unsupported, Alternately Place Feet on Step/Stool Able to complete >2 steps/needs minimal assist   Standing Unsupported, One Foot in Front Needs help to step but can hold 15 seconds   Standing on One Leg Tries to lift leg/unable to hold 3 seconds but remains standing independently   Total Score 24   Timed Up and Go Test   Normal TUG (seconds) 18.25  18.25sec with cane, 17.78sec without device  PT Education - 12/05/14 1338    Education provided Yes   Education Details evaluation results and fall risk   Person(s) Educated Patient;Spouse   Methods Explanation   Comprehension Verbalized understanding          PT Short Term Goals - 12/05/14 1348    PT SHORT TERM GOAL #1   Title demonstrates understanding of initial HEP (Target Date: 01/04/15)   Time 30   Period Days   Status New   PT SHORT TERM GOAL #2   Title verbalizes understanding of fall prevention strategies in home (Target Date: 01/04/15)   Time 30   Period Days   Status New   PT SHORT TERM GOAL #3   Title ambulates 200' with single point cane with cues for gait. (Target Date: 01/04/15)   Time 30   Period Days   Status New   PT SHORT TERM GOAL #4   Title negotiates ramp & curb with single point cane with minimal assist. (Target Date: 01/04/15)   Time 30   Period Days   Status New           PT Long Term Goals - 12/05/14 1351    PT LONG TERM GOAL #1   Title verbalizes / demonstrates understanding of fitness plan /ongoing HEP. (Target Date: 02/02/15)   Time 60   Period Days   Status New   PT LONG TERM GOAL #2   Title ambulates 300' with LRAD safely modified independent. (Target Date: 02/02/15)   Time 60   Period Days   Status New   PT LONG TERM GOAL #3   Title negotiates ramp, curb & stairs (1  rail) with LRAD modified independent. (Target Date: 02/02/15)   Time 60   Period Days   Status New   PT LONG TERM GOAL #4   Title Berg Balance >32/ 56 (Target Date: 02/02/15)   Time 60   Period Days   Status New   PT LONG TERM GOAL #5   Title Timed Up & Go with & without cane <13.5 seconds (Target Date: 02/02/15)   Time 60   Period Days   Status New               Plan - 12/05/14 1339    Clinical Impression Statement This 80yo male with neuropathy, cardiac and orthopedic (left THR & right TKR) has gait and balance deficits placing him at high risk of falls.  Muscle weakness, gait abnormality, Berg Balance (24/56), Gait Velocity 1.72ft/sec, TImed Up-Go 18.25sec.                                             Pt will benefit from skilled therapeutic intervention in order to improve on the following deficits Abnormal gait;Decreased activity tolerance;Decreased balance;Decreased endurance;Decreased knowledge of use of DME;Decreased mobility;Decreased range of motion;Decreased strength;Postural dysfunction   Rehab Potential Good   PT Frequency 2x / week   PT Duration Other (comment)  9 weeks (60 days)   PT Treatment/Interventions ADLs/Self Care Home Management;DME Instruction;Gait training;Stair training;Functional mobility training;Therapeutic activities;Therapeutic exercise;Balance training;Neuromuscular re-education;Patient/family education   PT Next Visit Plan HEP, try toe-off AFO   PT Home Exercise Plan ROM & strength of ankles & hips, balance   Consulted and Agree with Plan of Care Patient;Family member/caregiver   Family Member Consulted wife  G-Codes - 12/05/14 1355    Functional Assessment Tool Used Merrilee Jansky Balance 24/56   Functional Limitation Mobility: Walking and moving around   Mobility: Walking and Moving Around Current Status [phone]) At least 60 percent but less than 80 percent impaired, limited or restricted   Mobility: Walking and Moving Around Goal Status  [phone]) At least 40 percent but less than 60 percent impaired, limited or restricted       Problem List Patient Active Problem List   Diagnosis Date Noted  . Pacemaker 07/02/2013  . CAD (coronary artery disease) of artery bypass graft 02/16/2013  . HTN (hypertension) 02/16/2013  . Hyperlipidemia 02/16/2013  . Postoperative anemia due to acute blood loss 01/20/2013  . Postop Hyponatremia 01/18/2013  . OA (osteoarthritis) of knee 01/17/2013  . Atrial fibrillation 12/21/2012  . Long term (current) use of anticoagulants 12/21/2012    Beatriz Quintela PT, DPT 12/05/2014, 1:58 PM  [facility] [address] [place], [name], [address] Phone: [phone]   Fax:  [phone]

## 2014-12-13 ENCOUNTER — Ambulatory Visit (INDEPENDENT_AMBULATORY_CARE_PROVIDER_SITE_OTHER): Payer: Medicare Other | Admitting: *Deleted

## 2014-12-13 DIAGNOSIS — I482 Chronic atrial fibrillation, unspecified: Secondary | ICD-10-CM

## 2014-12-13 LAB — MDC_IDC_ENUM_SESS_TYPE_INCLINIC
Battery Impedance: 2485 Ohm
Battery Remaining Longevity: 22 mo
Brady Statistic RV Percent Paced: 100 %
Date Time Interrogation Session: 20160309154659
Lead Channel Pacing Threshold Amplitude: 0.75 V
Lead Channel Pacing Threshold Pulse Width: 0.4 ms
Lead Channel Setting Sensing Sensitivity: 5.6 mV
MDC IDC MSMT BATTERY VOLTAGE: 2.72 V
MDC IDC MSMT LEADCHNL RA IMPEDANCE VALUE: 67 Ohm
MDC IDC MSMT LEADCHNL RV IMPEDANCE VALUE: 732 Ohm
MDC IDC SET LEADCHNL RV PACING AMPLITUDE: 2.5 V
MDC IDC SET LEADCHNL RV PACING PULSEWIDTH: 0.4 ms

## 2014-12-13 NOTE — Progress Notes (Signed)
Pacemaker check in clinic. Normal device function. Thresholds, sensing, impedances consistent with previous measurements. Device programmed to maximize longevity. No high ventricular rates noted. Device programmed at appropriate safety margins. Histogram distribution appropriate for patient activity level. Device programmed to optimize intrinsic conduction. Estimated longevity 8-35 months with an average of 22 months.  Patient enrolled in remote follow-up/TTM's with [employer]. Plan to follow every 3 months remotely and see annually in office. Patient education completed and Wire-X ordered.  [facility] 03/14/15.

## 2014-12-14 ENCOUNTER — Ambulatory Visit: Payer: Medicare Other | Admitting: Occupational Therapy

## 2014-12-14 ENCOUNTER — Encounter: Payer: Self-pay | Admitting: Physical Therapy

## 2014-12-14 ENCOUNTER — Ambulatory Visit: Payer: Medicare Other | Admitting: Physical Therapy

## 2014-12-14 ENCOUNTER — Encounter: Payer: Self-pay | Admitting: Occupational Therapy

## 2014-12-14 DIAGNOSIS — R279 Unspecified lack of coordination: Secondary | ICD-10-CM

## 2014-12-14 DIAGNOSIS — R2689 Other abnormalities of gait and mobility: Secondary | ICD-10-CM

## 2014-12-14 DIAGNOSIS — R531 Weakness: Secondary | ICD-10-CM

## 2014-12-14 DIAGNOSIS — Z7409 Other reduced mobility: Secondary | ICD-10-CM

## 2014-12-14 DIAGNOSIS — R278 Other lack of coordination: Secondary | ICD-10-CM

## 2014-12-14 NOTE — Patient Instructions (Addendum)
Assessed functional reach R = ~90* (second shelf of cabinet)  then he was noted to hike his shoulder and compensate. Able to reach to third shelf of cabinet or 120* R while hiking shoulder noted. L = 120* to top shelf in cabinet.  Pt may benefit from shoulder stabilization exercises R UE, generalized shoulder strengthening and possible weighted a/e secondary to tremors.

## 2014-12-14 NOTE — Therapy (Signed)
[facility] [address] [place], [name], [address] Phone: [phone]   Fax:  [phone]  Occupational Therapy Treatment  Patient Details  Name: Mike Price MRN: [id number] Date of Birth: Aug 10, 1935 Referring Provider:  [name], MD  Encounter Date: 12/14/2014      OT End of Session - 12/14/14 1118    Visit Number 2   Number of Visits 17   Date for OT Re-Evaluation 01/30/15   Authorization Type Medicare, UHC secondary   Authorization Time Period 60 days-01/30/15   Authorization - Visit Number 1   Authorization - Number of Visits 10   OT Start Time 0845   OT Stop Time 0930   OT Time Calculation (min) 45 min   Activity Tolerance Patient tolerated treatment well   Behavior During Therapy [facility] for tasks assessed/performed      Past Medical History  Diagnosis Date  . Hypertension   . Thyroid disease   . TMJ tenderness 01-11-13    right side-has issues from time to time.  . Pacemaker 12/15/06    3'08  . Dysrhythmia 01-11-13     hx. A. Fib-Pacemaker implanted left chest  . Hypothyroidism   . Anxiety 01-11-13    tx. Clonazepam.  . Edema extremities 01-11-13    retains fluid in legs-right greater than left.  . Neuromuscular disorder     neuropathy in feet  . GERD (gastroesophageal reflux disease)   . Arthritis     Osteoarthritis-knees, fingers  . Anemia   . CAD (coronary artery disease)   . Hyperlipemia   . Obstructive sleep apnea     on C Pap    Past Surgical History  Procedure Laterality Date  . Coronary artery bypass graft  2001    4 vessels-'00  . Tonsillectomy      child  . Appendectomy    . Insert / replace / remove pacemaker      '08-inserted left chest  . Cervical laminectomy    . Hernia repair    . Joint replacement      LTHA  . Total knee arthroplasty Right 01/17/2013    Procedure: RIGHT TOTAL KNEE ARTHROPLASTY;  Surgeon: Gearlean Alf, MD;  Location: [facility];  Service: Orthopedics;   Laterality: Right;  . Cardiac catheterization  03/28/08    patent grafts, nl EF  . Pacemaker insertion  12/15/06    medtronic  . US echocardiography  12/28/2007    LA mod. dilated,RA mildly dilated  . Nm myocar perf wall motion  02/12/2012    small fixed distal anteroapical/apical defect. No reversible ischemia    There were no vitals taken for this visit.  Visit Diagnosis:  Weakness generalized  Decreased coordination  Impaired functional mobility and activity tolerance      Subjective Assessment - 12/14/14 0849    Symptoms Pt reports that he has decreased coordination in bilateral UE's, but "I notice it more in my right hand b/c I'm right handed"   Currently in Pain? No/denies                 OT Treatments/Exercises (OP) - 12/14/14 0001    Fine Motor Coordination   Flipping cards R & L hands  Min-moderate difficulty noted   Dealing card with thumb R & L hands  R= Mod difficulty; L= Min-mod difficulty noted   Picking up coins Picking up with R moderate difficulty; L minimal difficulty   Stacking coins Stacking into piles of 5  R > difficulty than L (non-dominant)   Tossing ball Between hands and tossing and catching in one hand at a time.  R > L difficulty     Assessed functional reach R = ~90* (second shelf of cabinet)  then he was noted to hike his shoulder and compensate. Able to reach to third shelf of cabinet or 120* R while hiking shoulder noted. L = 120* to top shelf in cabinet.  Pt may benefit from shoulder stabilization exercises R UE, generalized shoulder strengthening and possible weighted a/e secondary to tremors           OT Education - 12/14/14 1118    Education provided Yes   Education Details HEP for coordination; avoid shoulder hike RUE during functional activitiy   Person(s) Educated Patient   Methods Explanation;Demonstration;Handout   Comprehension Verbalized understanding          OT Short Term Goals - 12/05/14 1720    OT SHORT  TERM GOAL #1   Title I with HEP   Baseline check 01/04/15   Time 4   Period Weeks   Status New   OT SHORT TERM GOAL #2   Title Pt will demonstrate ability to perform functional reaching in standing x 15 mins without LOB or rest breaks.   Time 4   Period Weeks   Status New   OT SHORT TERM GOAL #3   Title Pt will verbalize understanding of adapted strategies for ADLS/IADLS to increase pt safety, independence and minimize spills   Time 4   Period Weeks   Status New           OT Long Term Goals - 12/05/14 1723    OT LONG TERM GOAL #1   Title Pt will demonstrate improved standing balance as evidenced by increasing standing functional reach by 2 inches bilaterally(from baseline.)   Baseline check 01/30/15   Time 8   Period Weeks   Status New   OT LONG TERM GOAL #2   Title Pt will demonstrate ability to perfom dynamic functional reaching activities in standing without LOB in prep for improved ADL/IADL performance.   Time 8   Period Weeks   Status New   OT LONG TERM GOAL #3   Title Pt will demonstrate improved fine motor coordination as evidenced by decreasing bilateral 9 hole peg test scores to 35 secs or less without any drops.   Time 8   Period Weeks   Status New   OT LONG TERM GOAL #4   Title Pt will demonstrate ability to write a short paragraph without decrease in letter size and 100% legibility.   Time 8   Period Weeks   Status New   OT LONG TERM GOAL #5   Title I with updated HEP.   Time 8   Period Weeks   Status New               Plan - 12/14/14 1119    Clinical Impression Statement Pt with noted decreased fine motor control, coordination, strength, tremors and activity tolerance which impedes his ability to perform ADL's and functional activities.   Rehab Potential Good   Clinical Impairments Affecting Rehab Potential decreased coordination, tremor, decreased strength, decreased activity tolerance   OT Frequency 2x / week  plus Eval   OT Duration 8  weeks   Plan Consider shoulder stabilization and strengthening ex's as well as possible weighted a/e 2* tremors, review Coord HEP.   Consulted and Agree with Plan of  Care Patient        Problem List Patient Active Problem List   Diagnosis Date Noted  . Pacemaker 07/02/2013  . CAD (coronary artery disease) of artery bypass graft 02/16/2013  . HTN (hypertension) 02/16/2013  . Hyperlipidemia 02/16/2013  . Postoperative anemia due to acute blood loss 01/20/2013  . Postop Hyponatremia 01/18/2013  . OA (osteoarthritis) of knee 01/17/2013  . Atrial fibrillation 12/21/2012  . Long term (current) use of anticoagulants 12/21/2012    Barnhill, Amy Ardath Sax, OTR/L 12/14/2014, 11:23 AM  [facility] [address] [place], [name], [address] Phone: [phone]   Fax:  [phone]

## 2014-12-14 NOTE — Patient Instructions (Signed)
Ankle Plantar Flexion / Dorsiflexion, Standing   Stand while holding a stable object. Rise up on toes. Then rock back on heels. Hold each position _5__ seconds. Repeat _10 times per session. Do __1-2_ sessions per day.  Copyright  VHI. All rights reserved.  HIP: Abduction - Standing   Take one leg out to the side and back in. Repeat with other leg. _10__ reps per leg,  1-2 times a day. Hold onto a support as needed for balance.  Copyright  VHI. All rights reserved.  HIP / KNEE: Extension - Standing   Raise and lift leg backward. Keep knee straight or slightly bent. Repeat with other leg. Hold onto counter top as needed for balance. __10_ reps per leg, 1-2 times a day.   Copyright  VHI. All rights reserved.  Functional Quadriceps: Sit to Stand   Sit on edge of chair, feet flat on floor. Stand upright, extending knees fully. Higher surface will be easier on knees. Repeat _5-10___ times per set. Do __1__ sets per session. Do __1-2__ sessions per day.  http://orth.exer.us/734   Copyright  VHI. All rights reserved.  "I love a Parade" Lift   At countertop: high knee marches forward and then backward. Repeat for 3 laps each way. 1-2 times a day.  http://gt2.exer.us/344   Copyright  VHI. All rights reserved.  Side-Stepping   Walk toward one side with eyes open. Take even steps, leading with same foot. Make sure each foot lifts off the floor. Repeat in opposite direction. Repeat for 3 laps each way. 1-2 times a day.  Copyright  VHI. All rights reserved.  Feet Heel-Toe "Tandem"   At counter top: walk a straight line forward bringing one foot directly in front of the other and the backwards bringing one leg behind the other one. Repeat for 3 laps each way. 1-2 times a day.  Copyright  VHI. All rights reserved.  Walking on Toes   At counter top: Walk on toes forward and then backwards. Repeat for 3 laps each way. 1-2 times a day. Copyright  VHI. All rights  reserved.

## 2014-12-15 NOTE — Therapy (Signed)
[facility] [address] [place], [name], [address] Phone: [phone]   Fax:  [phone]  Physical Therapy Treatment  Patient Details  Name: Mike Price MRN: [id number] Date of Birth: [birth year] Referring Provider:  [name], MD  Encounter Date: 12/14/2014      PT End of Session - 12/14/14 0937    Visit Number 2   Number of Visits 18   Date for PT Re-Evaluation 02/02/15   PT Start Time 0934   PT Stop Time 1015   PT Time Calculation (min) 41 min   Equipment Utilized During Treatment Gait belt   Activity Tolerance Patient tolerated treatment well   Behavior During Therapy [facility] for tasks assessed/performed      Past Medical History  Diagnosis Date  . Hypertension   . Thyroid disease   . TMJ tenderness 01-11-13    right side-has issues from time to time.  . Pacemaker 12/15/06    3'08  . Dysrhythmia 01-11-13     hx. A. Fib-Pacemaker implanted left chest  . Hypothyroidism   . Anxiety 01-11-13    tx. Clonazepam.  . Edema extremities 01-11-13    retains fluid in legs-right greater than left.  . Neuromuscular disorder     neuropathy in feet  . GERD (gastroesophageal reflux disease)   . Arthritis     Osteoarthritis-knees, fingers  . Anemia   . CAD (coronary artery disease)   . Hyperlipemia   . Obstructive sleep apnea     on C Pap    Past Surgical History  Procedure Laterality Date  . Coronary artery bypass graft  2001    4 vessels-'00  . Tonsillectomy      child  . Appendectomy    . Insert / replace / remove pacemaker      '08-inserted left chest  . Cervical laminectomy    . Hernia repair    . Joint replacement      LTHA  . Total knee arthroplasty Right 01/17/2013    Procedure: RIGHT TOTAL KNEE ARTHROPLASTY;  Surgeon: Gearlean Alf, MD;  Location: [facility];  Service: Orthopedics;  Laterality: Right;  . Cardiac catheterization  03/28/08    patent grafts, nl EF  . Pacemaker insertion  12/15/06   medtronic  . US echocardiography  12/28/2007    LA mod. dilated,RA mildly dilated  . Nm myocar perf wall motion  02/12/2012    small fixed distal anteroapical/apical defect. No reversible ischemia    There were no vitals filed for this visit.  Visit Diagnosis:  Balance problems  Weakness generalized      Subjective Assessment - 12/14/14 0936    Symptoms No new complaints. No pain or falls to report. Does have some shoulder "tightness".   Currently in Pain? No/denies     Treatment: Exercise With UE assist as needed, cues on posture and ex form/posture - heel/toe raises x 10 reps each - alternating hip abduction/extension x 10 reps each bil legs - sit/stand x 10 reps from higher surface. Pt reported this made his knee sore, decreased to 5 reps with UE support at home until knee get's stronger and no soreness felt with the ex, then pt to increase reps. - Scifit x 4 extremities level 2.0 x 8 minutes with goal >/= 50 steps for strengthening and activity tolerance  Neuro Re-ed: At counter with UE support as needed - side stepping x 3 laps each way with emphasis on hip/foot position and ex form -  high knee marching forward/backwards x 3 laps each with cues on posture - tandem gait forward/backward x 3 laps with cues on posture and technique - toe walking forward/backwards x 3 laps with cues on posture and technique - attempted heel walking forward, pt with increased knee pain so activity stopped        PT Short Term Goals - 12/05/14 1348    PT SHORT TERM GOAL #1   Title demonstrates understanding of initial HEP (Target Date: 01/04/15)   Time 30   Period Days   Status New   PT SHORT TERM GOAL #2   Title verbalizes understanding of fall prevention strategies in home (Target Date: 01/04/15)   Time 30   Period Days   Status New   PT SHORT TERM GOAL #3   Title ambulates 200' with single point cane with cues for gait. (Target Date: 01/04/15)   Time 30   Period Days   Status New    PT SHORT TERM GOAL #4   Title negotiates ramp & curb with single point cane with minimal assist. (Target Date: 01/04/15)   Time 30   Period Days   Status New           PT Long Term Goals - 12/05/14 1351    PT LONG TERM GOAL #1   Title verbalizes / demonstrates understanding of fitness plan /ongoing HEP. (Target Date: 02/02/15)   Time 60   Period Days   Status New   PT LONG TERM GOAL #2   Title ambulates 300' with LRAD safely modified independent. (Target Date: 02/02/15)   Time 60   Period Days   Status New   PT LONG TERM GOAL #3   Title negotiates ramp, curb & stairs (1 rail) with LRAD modified independent. (Target Date: 02/02/15)   Time 60   Period Days   Status New   PT LONG TERM GOAL #4   Title Berg Balance >32/ 56 (Target Date: 02/02/15)   Time 60   Period Days   Status New   PT LONG TERM GOAL #5   Title Timed Up & Go with & without cane <13.5 seconds (Target Date: 02/02/15)   Time 60   Period Days   Status New            Plan - 12/14/14 0937    Clinical Impression Statement Educated on and issued HEP for strengthening and balance today without any issues being reported. Pt making steady progress toward goals.   Pt will benefit from skilled therapeutic intervention in order to improve on the following deficits Abnormal gait;Decreased activity tolerance;Decreased balance;Decreased endurance;Decreased knowledge of use of DME;Decreased mobility;Decreased range of motion;Decreased strength;Postural dysfunction   Rehab Potential Good   PT Frequency 2x / week   PT Duration Other (comment)  9 weeks (60 days)   PT Treatment/Interventions ADLs/Self Care Home Management;DME Instruction;Gait training;Stair training;Functional mobility training;Therapeutic activities;Therapeutic exercise;Balance training;Neuromuscular re-education;Patient/family education   PT Next Visit Plan  try toe-off AFO; continue with gait, strengthening and balance    PT Home Exercise Plan --    Consulted and Agree with Plan of Care Patient;Family member/caregiver   Family Member Consulted wife      Problem List Patient Active Problem List   Diagnosis Date Noted  . Pacemaker 07/02/2013  . CAD (coronary artery disease) of artery bypass graft 02/16/2013  . HTN (hypertension) 02/16/2013  . Hyperlipidemia 02/16/2013  . Postoperative anemia due to acute blood loss 01/20/2013  . Postop Hyponatremia 01/18/2013  .  OA (osteoarthritis) of knee 01/17/2013  . Atrial fibrillation 12/21/2012  . Long term (current) use of anticoagulants 12/21/2012    Willow Ora 12/15/2014, 1:02 PM  Willow Ora, PTA, [place] [address] [place], [place] [address] [phone] 12/15/2014, 1:02 PM

## 2014-12-18 ENCOUNTER — Ambulatory Visit: Payer: Medicare Other

## 2014-12-18 ENCOUNTER — Encounter: Payer: Self-pay | Admitting: Occupational Therapy

## 2014-12-18 ENCOUNTER — Ambulatory Visit: Payer: Medicare Other | Admitting: Occupational Therapy

## 2014-12-18 VITALS — BP 130/80 | HR 95

## 2014-12-18 DIAGNOSIS — R278 Other lack of coordination: Secondary | ICD-10-CM

## 2014-12-18 DIAGNOSIS — R279 Unspecified lack of coordination: Secondary | ICD-10-CM | POA: Diagnosis not present

## 2014-12-18 DIAGNOSIS — R2689 Other abnormalities of gait and mobility: Secondary | ICD-10-CM

## 2014-12-18 DIAGNOSIS — R531 Weakness: Secondary | ICD-10-CM

## 2014-12-18 NOTE — Therapy (Signed)
[facility] [address] [place], [name], [address] Phone: [phone]   Fax:  [phone]  Occupational Therapy Treatment  Patient Details  Name: Mike Price MRN: [id number] Date of Birth: 09-28-1935 Referring Provider:  [name], MD  Encounter Date: 12/18/2014      OT End of Session - 12/18/14 1156    Visit Number 3  G3   Number of Visits 17   Date for OT Re-Evaluation 01/30/15   Authorization Type Medicare, [employer] secondary   Authorization Time Period 60 days-01/30/15   OT Start Time 0935   OT Stop Time 1015   OT Time Calculation (min) 40 min   Activity Tolerance Patient tolerated treatment well      Past Medical History  Diagnosis Date  . Hypertension   . Thyroid disease   . TMJ tenderness 01-11-13    right side-has issues from time to time.  . Pacemaker 12/15/06    3'08  . Dysrhythmia 01-11-13     hx. A. Fib-Pacemaker implanted left chest  . Hypothyroidism   . Anxiety 01-11-13    tx. Clonazepam.  . Edema extremities 01-11-13    retains fluid in legs-right greater than left.  . Neuromuscular disorder     neuropathy in feet  . GERD (gastroesophageal reflux disease)   . Arthritis     Osteoarthritis-knees, fingers  . Anemia   . CAD (coronary artery disease)   . Hyperlipemia   . Obstructive sleep apnea     on C Pap    Past Surgical History  Procedure Laterality Date  . Coronary artery bypass graft  2001    4 vessels-'00  . Tonsillectomy      child  . Appendectomy    . Insert / replace / remove pacemaker      '08-inserted left chest  . Cervical laminectomy    . Hernia repair    . Joint replacement      LTHA  . Total knee arthroplasty Right 01/17/2013    Procedure: RIGHT TOTAL KNEE ARTHROPLASTY;  Surgeon: Gearlean Alf, MD;  Location: [facility];  Service: Orthopedics;  Laterality: Right;  . Cardiac catheterization  03/28/08    patent grafts, nl EF  . Pacemaker insertion  12/15/06    medtronic   . US echocardiography  12/28/2007    LA mod. dilated,RA mildly dilated  . Nm myocar perf wall motion  02/12/2012    small fixed distal anteroapical/apical defect. No reversible ischemia    There were no vitals filed for this visit.  Visit Diagnosis:  Decreased coordination      Subjective Assessment - 12/18/14 0942    Symptoms Pt reports that he has decreased coordination in bilateral UE's, but "I notice it more in my right hand b/c I'm right handed"   Currently in Pain? No/denies                    OT Treatments/Exercises (OP) - 12/18/14 0001    ADLs   Writing Practiced writing with regular pen, built up pen (with foam) and weighted pen. Pt with decreased legibility and increased difficulty w/ foam on pen. Pt had slightly decreased tremors with weighted pen but decreased legibility. Pt also wrote with felt pen w/ less tremors, increased legibility but writing a little smaller.    ADL Comments Also made recommendations for eating w/ less tremors: keeping upper arm stabalized and/or use of wrist cuff weight. Discussed safe options for carrying coffee mug  d/t spills. Recommended using travel mug.    Fine Motor Coordination   Fine Motor Coordination --  Verbally reviewed coordination HEP and gave clarifications                  OT Short Term Goals - 12/05/14 1720    OT SHORT TERM GOAL #1   Title I with HEP   Baseline check 01/04/15   Time 4   Period Weeks   Status New   OT SHORT TERM GOAL #2   Title Pt will demonstrate ability to perform functional reaching in standing x 15 mins without LOB or rest breaks.   Time 4   Period Weeks   Status New   OT SHORT TERM GOAL #3   Title Pt will verbalize understanding of adapted strategies for ADLS/IADLS to increase pt safety, independence and minimize spills   Time 4   Period Weeks   Status New           OT Long Term Goals - 12/05/14 1723    OT LONG TERM GOAL #1   Title Pt will demonstrate improved standing  balance as evidenced by increasing standing functional reach by 2 inches bilaterally(from baseline.)   Baseline check 01/30/15   Time 8   Period Weeks   Status New   OT LONG TERM GOAL #2   Title Pt will demonstrate ability to perfom dynamic functional reaching activities in standing without LOB in prep for improved ADL/IADL performance.   Time 8   Period Weeks   Status New   OT LONG TERM GOAL #3   Title Pt will demonstrate improved fine motor coordination as evidenced by decreasing bilateral 9 hole peg test scores to 35 secs or less without any drops.   Time 8   Period Weeks   Status New   OT LONG TERM GOAL #4   Title Pt will demonstrate ability to write a short paragraph without decrease in letter size and 100% legibility.   Time 8   Period Weeks   Status New   OT LONG TERM GOAL #5   Title I with updated HEP.   Time 8   Period Weeks   Status New               Plan - 12/18/14 1157    Clinical Impression Statement Pt with increased legibility and decreased tremors writing with felt pen. Pt appeared to be open to suggestions for task modifications for eating and carrying coffee   Plan functional dynamic standing balance task, shoulder stabalization/strengthening ex's        Problem List Patient Active Problem List   Diagnosis Date Noted  . Pacemaker 07/02/2013  . CAD (coronary artery disease) of artery bypass graft 02/16/2013  . HTN (hypertension) 02/16/2013  . Hyperlipidemia 02/16/2013  . Postoperative anemia due to acute blood loss 01/20/2013  . Postop Hyponatremia 01/18/2013  . OA (osteoarthritis) of knee 01/17/2013  . Atrial fibrillation 12/21/2012  . Long term (current) use of anticoagulants 12/21/2012    Carey Bullocks, OTR/L 12/18/2014, 12:00 PM  [facility] [address] [place], [name], [address] Phone: [phone]   Fax:  [phone]

## 2014-12-18 NOTE — Patient Instructions (Signed)
Educated on process for getting brace and importance of foot support with neuropathy.

## 2014-12-18 NOTE — Therapy (Signed)
[facility] [address] [place], [name], [address] Phone: [phone]   Fax:  [phone]  Physical Therapy Treatment  Patient Details  Name: Mike Price MRN: [id number] Date of Birth: [birth year] Referring Provider:  [name], MD  Encounter Date: 12/18/2014      PT End of Session - 12/18/14 1631    Visit Number 3   Date for PT Re-Evaluation 02/02/15   PT Start Time 1017   PT Stop Time 1105   PT Time Calculation (min) 48 min   Activity Tolerance Patient tolerated treatment well   Behavior During Therapy [facility] for tasks assessed/performed      Past Medical History  Diagnosis Date  . Hypertension   . Thyroid disease   . TMJ tenderness 01-11-13    right side-has issues from time to time.  . Pacemaker 12/15/06    3'08  . Dysrhythmia 01-11-13     hx. A. Fib-Pacemaker implanted left chest  . Hypothyroidism   . Anxiety 01-11-13    tx. Clonazepam.  . Edema extremities 01-11-13    retains fluid in legs-right greater than left.  . Neuromuscular disorder     neuropathy in feet  . GERD (gastroesophageal reflux disease)   . Arthritis     Osteoarthritis-knees, fingers  . Anemia   . CAD (coronary artery disease)   . Hyperlipemia   . Obstructive sleep apnea     on C Pap    Past Surgical History  Procedure Laterality Date  . Coronary artery bypass graft  2001    4 vessels-'00  . Tonsillectomy      child  . Appendectomy    . Insert / replace / remove pacemaker      '08-inserted left chest  . Cervical laminectomy    . Hernia repair    . Joint replacement      LTHA  . Total knee arthroplasty Right 01/17/2013    Procedure: RIGHT TOTAL KNEE ARTHROPLASTY;  Surgeon: Gearlean Alf, MD;  Location: [facility];  Service: Orthopedics;  Laterality: Right;  . Cardiac catheterization  03/28/08    patent grafts, nl EF  . Pacemaker insertion  12/15/06    medtronic  . US echocardiography  12/28/2007    LA mod. dilated,RA  mildly dilated  . Nm myocar perf wall motion  02/12/2012    small fixed distal anteroapical/apical defect. No reversible ischemia    Filed Vitals:   12/18/14 1038  BP: 130/80  Pulse: 95  SpO2: 92%    Visit Diagnosis:  Weakness generalized  Balance problems      Subjective Assessment - 12/18/14 1025    Symptoms Did HEP.   Currently in Pain? No/denies  feet hurt if standing too long                       [place] Adult PT Treatment/Exercise - 12/18/14 1027    Ambulation/Gait   Ambulation/Gait Yes   Ambulation/Gait Assistance 5: Supervision   Ambulation/Gait Assistance Details with toe off on left for clearance and swing   Ambulation Distance (Feet) 110 Feet   Assistive device Straight cane   Gait Pattern Step-through pattern;Decreased stride length;Decreased dorsiflexion - right;Decreased dorsiflexion - left;Trunk flexed   Ambulation Surface Level;Indoor   High Level Balance   High Level Balance Activities Side stepping;Marching forwards;Marching backwards;Other (comment);Tandem walking  to review HEP intermittent UE support at counter    Exercises   Exercises Knee/Hip;Ankle   Knee/Hip Exercises:  Aerobic   Stationary Bike Sci Fit seated stepper UE/LE level 2 x 5 min   Knee/Hip Exercises: Standing   Other Standing Knee Exercises hip abduction x 10 each leg alternating holding onto counter   Other Standing Knee Exercises hip extension x 10 each leg holding counter   Knee/Hip Exercises: Seated   Other Seated Knee Exercises sit<>stand x 5 no UE support  educated at home to do only 5 prior to rest due to pain   Ankle Exercises: Standing   Heel Raises 10 reps;5 seconds  alternating with toe raises   Toe Walk (Round Trip) 3 laps near counter holding on                PT Education - 12/18/14 1630    Education provided Yes   Education Details brace   Person(s) Educated Patient   Methods Explanation   Comprehension Verbalized understanding           PT Short Term Goals - 12/18/14 1634    PT SHORT TERM GOAL #1   Title demonstrates understanding of initial HEP (Target Date: 01/04/15)   Time 30   Period Days   Status On-going   PT SHORT TERM GOAL #2   Title verbalizes understanding of fall prevention strategies in home (Target Date: 01/04/15)   Time 30   Period Days   Status On-going   PT SHORT TERM GOAL #3   Title ambulates 200' with single point cane with cues for gait. (Target Date: 01/04/15)   Time 30   Period Days   Status On-going   PT SHORT TERM GOAL #4   Title negotiates ramp & curb with single point cane with minimal assist. (Target Date: 01/04/15)   Time 30   Period Days   Status On-going           PT Long Term Goals - 12/18/14 1634    PT LONG TERM GOAL #1   Title verbalizes / demonstrates understanding of fitness plan /ongoing HEP. (Target Date: 02/02/15)   Time 60   Period Days   Status On-going   PT LONG TERM GOAL #2   Title ambulates 300' with LRAD safely modified independent. (Target Date: 02/02/15)   Time 60   Period Days   Status On-going   PT LONG TERM GOAL #3   Title negotiates ramp, curb & stairs (1 rail) with LRAD modified independent. (Target Date: 02/02/15)   Time 60   Period Days   Status On-going   PT LONG TERM GOAL #4   Title Berg Balance >32/ 56 (Target Date: 02/02/15)   Time 60   Period Days   Status On-going   PT LONG TERM GOAL #5   Title Timed Up & Go with & without cane <13.5 seconds (Target Date: 02/02/15)   Time 60   Period Days   Status On-going               Plan - 12/18/14 1632    Clinical Impression Statement Patient with improved foot clearance and subjectively gait velocity with toe off brace on left foot.  Feel worth pursuing for gait improvements.  Did perform HEP appropriately.   Pt will benefit from skilled therapeutic intervention in order to improve on the following deficits Abnormal gait;Decreased activity tolerance;Decreased balance;Decreased  endurance;Decreased knowledge of use of DME;Decreased mobility;Decreased range of motion;Decreased strength;Postural dysfunction   Rehab Potential Good   PT Frequency 2x / week   PT Duration Other (comment)  9 weeks  PT Treatment/Interventions ADLs/Self Care Home Management;DME Instruction;Gait training;Stair training;Functional mobility training;Therapeutic activities;Therapeutic exercise;Balance training;Neuromuscular re-education;Patient/family education   PT Next Visit Plan try [place], schedule orthotist appt if MD script back   Consulted and Agree with Plan of Care Patient        Problem List Patient Active Problem List   Diagnosis Date Noted  . Pacemaker 07/02/2013  . CAD (coronary artery disease) of artery bypass graft 02/16/2013  . HTN (hypertension) 02/16/2013  . Hyperlipidemia 02/16/2013  . Postoperative anemia due to acute blood loss 01/20/2013  . Postop Hyponatremia 01/18/2013  . OA (osteoarthritis) of knee 01/17/2013  . Atrial fibrillation 12/21/2012  . Long term (current) use of anticoagulants 12/21/2012    [facility] 12/18/2014, 4:37 PM  [facility], [place] [address] [place], [name], [address] Phone: [phone]   Fax:  [phone]

## 2014-12-20 ENCOUNTER — Ambulatory Visit: Payer: Medicare Other | Admitting: Occupational Therapy

## 2014-12-20 ENCOUNTER — Encounter: Payer: Self-pay | Admitting: Cardiovascular Disease

## 2014-12-20 ENCOUNTER — Encounter: Payer: Self-pay | Admitting: Physical Therapy

## 2014-12-20 ENCOUNTER — Ambulatory Visit: Payer: Medicare Other | Admitting: Physical Therapy

## 2014-12-20 DIAGNOSIS — R279 Unspecified lack of coordination: Secondary | ICD-10-CM

## 2014-12-20 DIAGNOSIS — R269 Unspecified abnormalities of gait and mobility: Secondary | ICD-10-CM

## 2014-12-20 DIAGNOSIS — Z7409 Other reduced mobility: Secondary | ICD-10-CM

## 2014-12-20 DIAGNOSIS — R531 Weakness: Secondary | ICD-10-CM

## 2014-12-20 DIAGNOSIS — R2689 Other abnormalities of gait and mobility: Secondary | ICD-10-CM

## 2014-12-20 DIAGNOSIS — R29898 Other symptoms and signs involving the musculoskeletal system: Secondary | ICD-10-CM

## 2014-12-20 DIAGNOSIS — R278 Other lack of coordination: Secondary | ICD-10-CM

## 2014-12-20 NOTE — Therapy (Signed)
[facility] [address] [place], [name], [address] Phone: [phone]   Fax:  [phone]  Physical Therapy Treatment  Patient Details  Name: Mike Price MRN: [id number] Date of Birth: August 03, 1935 Referring Provider:  [name], MD  Encounter Date: 12/20/2014      PT End of Session - 12/20/14 1325    Visit Number 4   Date for PT Re-Evaluation 02/02/15   PT Start Time 1316   PT Stop Time 1358   PT Time Calculation (min) 42 min   Equipment Utilized During Treatment Gait belt   Activity Tolerance Patient tolerated treatment well   Behavior During Therapy [facility] for tasks assessed/performed      Past Medical History  Diagnosis Date  . Hypertension   . Thyroid disease   . TMJ tenderness 01-11-13    right side-has issues from time to time.  . Pacemaker 12/15/06    3'08  . Dysrhythmia 01-11-13     hx. A. Fib-Pacemaker implanted left chest  . Hypothyroidism   . Anxiety 01-11-13    tx. Clonazepam.  . Edema extremities 01-11-13    retains fluid in legs-right greater than left.  . Neuromuscular disorder     neuropathy in feet  . GERD (gastroesophageal reflux disease)   . Arthritis     Osteoarthritis-knees, fingers  . Anemia   . CAD (coronary artery disease)   . Hyperlipemia   . Obstructive sleep apnea     on C Pap    Past Surgical History  Procedure Laterality Date  . Coronary artery bypass graft  2001    4 vessels-'00  . Tonsillectomy      child  . Appendectomy    . Insert / replace / remove pacemaker      '08-inserted left chest  . Cervical laminectomy    . Hernia repair    . Joint replacement      LTHA  . Total knee arthroplasty Right 01/17/2013    Procedure: RIGHT TOTAL KNEE ARTHROPLASTY;  Surgeon: Gearlean Alf, MD;  Location: [facility];  Service: Orthopedics;  Laterality: Right;  . Cardiac catheterization  03/28/08    patent grafts, nl EF  . Pacemaker insertion  12/15/06    medtronic  . US  echocardiography  12/28/2007    LA mod. dilated,RA mildly dilated  . Nm myocar perf wall motion  02/12/2012    small fixed distal anteroapical/apical defect. No reversible ischemia    There were no vitals filed for this visit.  Visit Diagnosis:  Weakness generalized  Abnormality of gait  Ankle weakness      Subjective Assessment - 12/20/14 1322    Symptoms  No new complaints. No falls. Reports he "twinged" his back last.    Currently in Pain? Yes   Pain Score 3    Pain Location Back   Pain Orientation Lower   Pain Descriptors / Indicators Aching;Sore   Pain Type Acute pain   Pain Onset More than a month ago   Pain Frequency Occasional   Aggravating Factors  poor posture, poor sleeping positions   Pain Relieving Factors rest, heat, pain meds if needed     Treatment: Gait - 230 feet with toe off brace to left foot with cane and min guard assist to supervision. Improved foot clearance noted.  - 230 feet with ottobock to left foot/ankle with cane and min guard assist. Pt with good foot clearance however states he likes the toe off brace better.  Cues on posture and increased step length with all gait.  Exercise At counter - heel raises 5 sec x 10 reps - toe raises 5 sec x 10 reps  - alternating hip abduction and extension x 10 each bil legs - sit/stands with out UE assist x 10 reps with cues on anterior weight shifting and slow, controlled descent. No knee pain reported with this today.         PT Short Term Goals - 12/18/14 1634    PT SHORT TERM GOAL #1   Title demonstrates understanding of initial HEP (Target Date: 01/04/15)   Time 30   Period Days   Status On-going   PT SHORT TERM GOAL #2   Title verbalizes understanding of fall prevention strategies in home (Target Date: 01/04/15)   Time 30   Period Days   Status On-going   PT SHORT TERM GOAL #3   Title ambulates 200' with single point cane with cues for gait. (Target Date: 01/04/15)   Time 30   Period Days    Status On-going   PT SHORT TERM GOAL #4   Title negotiates ramp & curb with single point cane with minimal assist. (Target Date: 01/04/15)   Time 30   Period Days   Status On-going           PT Long Term Goals - 12/18/14 1634    PT LONG TERM GOAL #1   Title verbalizes / demonstrates understanding of fitness plan /ongoing HEP. (Target Date: 02/02/15)   Time 60   Period Days   Status On-going   PT LONG TERM GOAL #2   Title ambulates 300' with LRAD safely modified independent. (Target Date: 02/02/15)   Time 60   Period Days   Status On-going   PT LONG TERM GOAL #3   Title negotiates ramp, curb & stairs (1 rail) with LRAD modified independent. (Target Date: 02/02/15)   Time 60   Period Days   Status On-going   PT LONG TERM GOAL #4   Title Berg Balance >32/ 56 (Target Date: 02/02/15)   Time 60   Period Days   Status On-going   PT LONG TERM GOAL #5   Title Timed Up & Go with & without cane <13.5 seconds (Target Date: 02/02/15)   Time 60   Period Days   Status On-going           Plan - 12/20/14 1325    Clinical Impression Statement Pt prefers the toe off brace over the ottobock brace. Will send for a MD order for this. Progressing toward goals.   Pt will benefit from skilled therapeutic intervention in order to improve on the following deficits Abnormal gait;Decreased activity tolerance;Decreased balance;Decreased endurance;Decreased knowledge of use of DME;Decreased mobility;Decreased range of motion;Decreased strength;Postural dysfunction   Rehab Potential Good   PT Frequency 2x / week   PT Duration Other (comment)  9 weeks   PT Treatment/Interventions ADLs/Self Care Home Management;DME Instruction;Gait training;Stair training;Functional mobility training;Therapeutic activities;Therapeutic exercise;Balance training;Neuromuscular re-education;Patient/family education   PT Next Visit Plan Continue toward goals.   Consulted and Agree with Plan of Care Patient       Problem List Patient Active Problem List   Diagnosis Date Noted  . Pacemaker 07/02/2013  . CAD (coronary artery disease) of artery bypass graft 02/16/2013  . HTN (hypertension) 02/16/2013  . Hyperlipidemia 02/16/2013  . Postoperative anemia due to acute blood loss 01/20/2013  . Postop Hyponatremia 01/18/2013  . OA (osteoarthritis) of knee 01/17/2013  .  Atrial fibrillation 12/21/2012  . Long term (current) use of anticoagulants 12/21/2012    Willow Ora 12/21/2014, 4:31 PM  Willow Ora, PTA, [facility] [address], [place], [place] [address] [phone] 12/21/2014, 4:31 PM

## 2014-12-20 NOTE — Therapy (Signed)
[facility] [address] [place], [name], [address] Phone: [phone]   Fax:  [phone]  Occupational Therapy Treatment  Patient Details  Name: Mike Price MRN: [id number] Date of Birth: Oct 19, 1934 Referring Provider:  [name], MD  Encounter Date: 12/20/2014      OT End of Session - 12/20/14 1424    Visit Number 4   Number of Visits 17   Date for OT Re-Evaluation 01/30/15   Authorization Time Period 7 days-01/30/15   Authorization - Visit Number 4   OT Start Time 9476   OT Stop Time 1443   OT Time Calculation (min) 40 min   Activity Tolerance Patient tolerated treatment well   Behavior During Therapy [facility] for tasks assessed/performed      Past Medical History  Diagnosis Date  . Hypertension   . Thyroid disease   . TMJ tenderness 01-11-13    right side-has issues from time to time.  . Pacemaker 12/15/06    3'08  . Dysrhythmia 01-11-13     hx. A. Fib-Pacemaker implanted left chest  . Hypothyroidism   . Anxiety 01-11-13    tx. Clonazepam.  . Edema extremities 01-11-13    retains fluid in legs-right greater than left.  . Neuromuscular disorder     neuropathy in feet  . GERD (gastroesophageal reflux disease)   . Arthritis     Osteoarthritis-knees, fingers  . Anemia   . CAD (coronary artery disease)   . Hyperlipemia   . Obstructive sleep apnea     on C Pap    Past Surgical History  Procedure Laterality Date  . Coronary artery bypass graft  2001    4 vessels-'00  . Tonsillectomy      child  . Appendectomy    . Insert / replace / remove pacemaker      '08-inserted left chest  . Cervical laminectomy    . Hernia repair    . Joint replacement      LTHA  . Total knee arthroplasty Right 01/17/2013    Procedure: RIGHT TOTAL KNEE ARTHROPLASTY;  Surgeon: Gearlean Alf, MD;  Location: [facility];  Service: Orthopedics;  Laterality: Right;  . Cardiac catheterization  03/28/08    patent grafts, nl EF  .  Pacemaker insertion  12/15/06    medtronic  . US echocardiography  12/28/2007    LA mod. dilated,RA mildly dilated  . Nm myocar perf wall motion  02/12/2012    small fixed distal anteroapical/apical defect. No reversible ischemia    There were no vitals filed for this visit.  Visit Diagnosis:  Weakness generalized  Decreased coordination  Impaired functional mobility and activity tolerance  Balance problems      Subjective Assessment - 12/20/14 1406    Currently in Pain? Yes   Pain Score 3    Pain Location Back   Pain Orientation Lower   Pain Descriptors / Indicators Aching   Pain Type Acute pain   Pain Onset More than a month ago   Pain Frequency Intermittent   Aggravating Factors  malpositioning   Pain Relieving Factors rest   Multiple Pain Sites No            [facility] OT Assessment - 12/20/14 0001    Functional Reaching Activities   Mid Level standing functional reach test RUE 7 inches LUE 8 inches         Treatment: dynamic functional reaching activities in standing with trunk rotation reaching with right  and left UE's, minguard for balance, pt fatigued quickly. Standing at tabletop placing graded clothespins on vertical antennae for increased sustained pinch, RUE strength and standing tolerance.                 OT Short Term Goals - 12/05/14 1720    OT SHORT TERM GOAL #1   Title I with HEP   Baseline check 01/04/15   Time 4   Period Weeks   Status New   OT SHORT TERM GOAL #2   Title Pt will demonstrate ability to perform functional reaching in standing x 15 mins without LOB or rest breaks.   Time 4   Period Weeks   Status New   OT SHORT TERM GOAL #3   Title Pt will verbalize understanding of adapted strategies for ADLS/IADLS to increase pt safety, independence and minimize spills   Time 4   Period Weeks   Status New           OT Long Term Goals - 12/05/14 1723    OT LONG TERM GOAL #1   Title Pt will demonstrate improved standing  balance as evidenced by increasing standing functional reach by 2 inches bilaterally(from baseline.)   Baseline check 01/30/15   Time 8   Period Weeks   Status New   OT LONG TERM GOAL #2   Title Pt will demonstrate ability to perfom dynamic functional reaching activities in standing without LOB in prep for improved ADL/IADL performance.   Time 8   Period Weeks   Status New   OT LONG TERM GOAL #3   Title Pt will demonstrate improved fine motor coordination as evidenced by decreasing bilateral 9 hole peg test scores to 35 secs or less without any drops.   Time 8   Period Weeks   Status New   OT LONG TERM GOAL #4   Title Pt will demonstrate ability to write a short paragraph without decrease in letter size and 100% legibility.   Time 8   Period Weeks   Status New   OT LONG TERM GOAL #5   Title I with updated HEP.   Time 8   Period Weeks   Status New               Plan - 12/20/14 1425    Clinical Impression Statement Pt. is progressing towards goals for standing balance.   Plan dynamic standing balance/ shoulder exercises   Consulted and Agree with Plan of Care Patient        Problem List Patient Active Problem List   Diagnosis Date Noted  . Pacemaker 07/02/2013  . CAD (coronary artery disease) of artery bypass graft 02/16/2013  . HTN (hypertension) 02/16/2013  . Hyperlipidemia 02/16/2013  . Postoperative anemia due to acute blood loss 01/20/2013  . Postop Hyponatremia 01/18/2013  . OA (osteoarthritis) of knee 01/17/2013  . Atrial fibrillation 12/21/2012  . Long term (current) use of anticoagulants 12/21/2012    [name] 12/20/2014, 4:40 PM Theone Murdoch, OTR/L [phone] Phone: [phone] 4:40 PM 03/16/2016[facility] [address] [name], [name], [address] Phone: [phone]   Fax:  [phone]

## 2014-12-25 ENCOUNTER — Encounter: Payer: Self-pay | Admitting: Occupational Therapy

## 2014-12-25 ENCOUNTER — Encounter: Payer: Self-pay | Admitting: Physical Therapy

## 2014-12-25 ENCOUNTER — Ambulatory Visit: Payer: Medicare Other | Admitting: Occupational Therapy

## 2014-12-25 ENCOUNTER — Ambulatory Visit: Payer: Medicare Other | Admitting: Physical Therapy

## 2014-12-25 DIAGNOSIS — R279 Unspecified lack of coordination: Secondary | ICD-10-CM | POA: Diagnosis not present

## 2014-12-25 DIAGNOSIS — Z7409 Other reduced mobility: Secondary | ICD-10-CM

## 2014-12-25 DIAGNOSIS — R531 Weakness: Secondary | ICD-10-CM

## 2014-12-25 DIAGNOSIS — R2689 Other abnormalities of gait and mobility: Secondary | ICD-10-CM

## 2014-12-25 NOTE — Therapy (Signed)
[facility] [address] [place], [name], [address] Phone: [phone]   Fax:  [phone]  Physical Therapy Treatment  Patient Details  Name: Mike Price MRN: [id number] Date of Birth: 08/20/1935 Referring Provider:  [name], MD  Encounter Date: 12/25/2014      PT End of Session - 12/25/14 0930    Visit Number 5   Number of Visits 18   Date for PT Re-Evaluation 02/02/15   PT Start Time 0930   PT Stop Time 1015   PT Time Calculation (min) 45 min   Equipment Utilized During Treatment Gait belt   Activity Tolerance Patient tolerated treatment well   Behavior During Therapy [facility] for tasks assessed/performed      Past Medical History  Diagnosis Date  . Hypertension   . Thyroid disease   . TMJ tenderness 01-11-13    right side-has issues from time to time.  . Pacemaker 12/15/06    3'08  . Dysrhythmia 01-11-13     hx. A. Fib-Pacemaker implanted left chest  . Hypothyroidism   . Anxiety 01-11-13    tx. Clonazepam.  . Edema extremities 01-11-13    retains fluid in legs-right greater than left.  . Neuromuscular disorder     neuropathy in feet  . GERD (gastroesophageal reflux disease)   . Arthritis     Osteoarthritis-knees, fingers  . Anemia   . CAD (coronary artery disease)   . Hyperlipemia   . Obstructive sleep apnea     on C Pap    Past Surgical History  Procedure Laterality Date  . Coronary artery bypass graft  2001    4 vessels-'00  . Tonsillectomy      child  . Appendectomy    . Insert / replace / remove pacemaker      '08-inserted left chest  . Cervical laminectomy    . Hernia repair    . Joint replacement      LTHA  . Total knee arthroplasty Right 01/17/2013    Procedure: RIGHT TOTAL KNEE ARTHROPLASTY;  Surgeon: Gearlean Alf, MD;  Location: [facility];  Service: Orthopedics;  Laterality: Right;  . Cardiac catheterization  03/28/08    patent grafts, nl EF  . Pacemaker insertion  12/15/06     medtronic  . US echocardiography  12/28/2007    LA mod. dilated,RA mildly dilated  . Nm myocar perf wall motion  02/12/2012    small fixed distal anteroapical/apical defect. No reversible ischemia    There were no vitals filed for this visit.  Visit Diagnosis:  Weakness generalized  Balance problems  Impaired functional mobility and activity tolerance      Subjective Assessment - 12/25/14 0939    Symptoms (p) Back is still bothering him. It woke him up ~5:00 this morning.   Currently in Pain? (p) Yes   Pain Score (p) 5    Pain Location (p) Back   Pain Orientation (p) Lower   Pain Descriptors / Indicators (p) Aching   Aggravating Factors  (p) positioning, reaching to put shoes on   Pain Relieving Factors (p) rest, medicine   Multiple Pain Sites (p) No                      Therapeutic Exercise: See patient education for back exercises patient was instructed & performed.         PT Education - 12/25/14 0930    Education provided Yes   Education Details  activity level with arthritis, HEP: single knee to chest, double knee to chest, trunk rotation, hip rotation, seated trunk rotation, seated sidebend with reach stretch, standing trunk rotation, standing trunk extension, standing at doorframe overhead reach stretch   Person(s) Educated Patient   Methods Explanation;Demonstration   Comprehension Verbalized understanding;Returned demonstration;Need further instruction          PT Short Term Goals - 12/18/14 1634    PT SHORT TERM GOAL #1   Title demonstrates understanding of initial HEP (Target Date: 01/04/15)   Time 30   Period Days   Status On-going   PT SHORT TERM GOAL #2   Title verbalizes understanding of fall prevention strategies in home (Target Date: 01/04/15)   Time 30   Period Days   Status On-going   PT SHORT TERM GOAL #3   Title ambulates 200' with single point cane with cues for gait. (Target Date: 01/04/15)   Time 30   Period Days    Status On-going   PT SHORT TERM GOAL #4   Title negotiates ramp & curb with single point cane with minimal assist. (Target Date: 01/04/15)   Time 30   Period Days   Status On-going           PT Long Term Goals - 12/18/14 1634    PT LONG TERM GOAL #1   Title verbalizes / demonstrates understanding of fitness plan /ongoing HEP. (Target Date: 02/02/15)   Time 60   Period Days   Status On-going   PT LONG TERM GOAL #2   Title ambulates 300' with LRAD safely modified independent. (Target Date: 02/02/15)   Time 60   Period Days   Status On-going   PT LONG TERM GOAL #3   Title negotiates ramp, curb & stairs (1 rail) with LRAD modified independent. (Target Date: 02/02/15)   Time 60   Period Days   Status On-going   PT LONG TERM GOAL #4   Title Berg Balance >32/ 56 (Target Date: 02/02/15)   Time 60   Period Days   Status On-going   PT LONG TERM GOAL #5   Title Timed Up & Go with & without cane <13.5 seconds (Target Date: 02/02/15)   Time 60   Period Days   Status On-going               Plan - 12/25/14 0930    Clinical Impression Statement Patient reported back pain decreased from 5-6/10 to 2-3/10 with stretches / back exercises.    Pt will benefit from skilled therapeutic intervention in order to improve on the following deficits Abnormal gait;Decreased activity tolerance;Decreased balance;Decreased endurance;Decreased knowledge of use of DME;Decreased mobility;Decreased range of motion;Decreased strength;Postural dysfunction   Rehab Potential Good   PT Frequency 2x / week   PT Duration Other (comment)  9 weeks   PT Treatment/Interventions ADLs/Self Care Home Management;DME Instruction;Gait training;Stair training;Functional mobility training;Therapeutic activities;Therapeutic exercise;Balance training;Neuromuscular re-education;Patient/family education   PT Next Visit Plan Issue exercises for back. Continue toward goals.   Consulted and Agree with Plan of Care Patient         Problem List Patient Active Problem List   Diagnosis Date Noted  . Pacemaker 07/02/2013  . CAD (coronary artery disease) of artery bypass graft 02/16/2013  . HTN (hypertension) 02/16/2013  . Hyperlipidemia 02/16/2013  . Postoperative anemia due to acute blood loss 01/20/2013  . Postop Hyponatremia 01/18/2013  . OA (osteoarthritis) of knee 01/17/2013  . Atrial fibrillation 12/21/2012  . Long term (current)  use of anticoagulants 12/21/2012    Deserea Bordley PT, DPT 12/25/2014, 6:21 PM  [facility] [address] [place], [name], [address] Phone: [phone]   Fax:  [phone]

## 2014-12-25 NOTE — Patient Instructions (Signed)
  Strengthening: Resisted Flexion   Hold tubing with _both____ arm(s) at side. Pull forward and up. Move shoulder through pain-free range of motion. Repeat __10__ times per set.  Do _1-2_ sessions per day , every other day Keep Rt arm lower (at 45 degrees). Lt arm can go to eye level  Strengthening: Resisted Extension   Hold tubing in __both___ hand(s), arm forward. Pull arm back, elbow straight. Repeat _10___ times per set. Do _1-2___ sessions per day, every other day.   Resisted Horizontal Abduction: Bilateral   Sit or stand, tubing in both hands, arms out in front. Keeping arms straight, pinch shoulder blades together and stretch arms out. Repeat __5_ times per set, 2 sets. Do _1-2___ sessions per day, every other day.   Elbow Flexion: Resisted   With tubing held in __both____ hand(s) and other end secured under foot, curl arm up as far as possible. Repeat _10___ times per set. Do _1-2___ sessions per day, every other day.    Elbow Extension: Resisted   Sit in chair with resistive band  held with other hand and __Rt, (then Lt)_____ elbow bent. Straighten elbow. Repeat _10___ times per set.  Do _1-2___ sessions per day, every other day.   Copyright  VHI. All rights reserved.

## 2014-12-25 NOTE — Therapy (Signed)
[facility] [address] [place], [name], [address] Phone: [phone]   Fax:  [phone]  Occupational Therapy Treatment  Patient Details  Name: Mike Price MRN: [id number] Date of Birth: [birth year] Referring Provider:  [name], MD  Encounter Date: 12/25/2014      OT End of Session - 12/25/14 0930    Visit Number 5  5G   Number of Visits 17   Date for OT Re-Evaluation 01/30/15   Authorization Type Medicare, U[employer] secondary   Authorization Time Period 60 days-01/30/15   OT Start Time 0850   OT Stop Time 0930   OT Time Calculation (min) 40 min   Activity Tolerance Patient tolerated treatment well      Past Medical History  Diagnosis Date  . Hypertension   . Thyroid disease   . TMJ tenderness 01-11-13    right side-has issues from time to time.  . Pacemaker 12/15/06    3'08  . Dysrhythmia 01-11-13     hx. A. Fib-Pacemaker implanted left chest  . Hypothyroidism   . Anxiety 01-11-13    tx. Clonazepam.  . Edema extremities 01-11-13    retains fluid in legs-right greater than left.  . Neuromuscular disorder     neuropathy in feet  . GERD (gastroesophageal reflux disease)   . Arthritis     Osteoarthritis-knees, fingers  . Anemia   . CAD (coronary artery disease)   . Hyperlipemia   . Obstructive sleep apnea     on C Pap    Past Surgical History  Procedure Laterality Date  . Coronary artery bypass graft  2001    4 vessels-'00  . Tonsillectomy      child  . Appendectomy    . Insert / replace / remove pacemaker      '08-inserted left chest  . Cervical laminectomy    . Hernia repair    . Joint replacement      LTHA  . Total knee arthroplasty Right 01/17/2013    Procedure: RIGHT TOTAL KNEE ARTHROPLASTY;  Surgeon: Gearlean Alf, MD;  Location: [facility];  Service: Orthopedics;  Laterality: Right;  . Cardiac catheterization  03/28/08    patent grafts, nl EF  . Pacemaker insertion  12/15/06    medtronic   . US echocardiography  12/28/2007    LA mod. dilated,RA mildly dilated  . Nm myocar perf wall motion  02/12/2012    small fixed distal anteroapical/apical defect. No reversible ischemia    There were no vitals filed for this visit.  Visit Diagnosis:  Weakness generalized      Subjective Assessment - 12/25/14 0858    Symptoms "I wrenched my lower back"   Currently in Pain? Yes   Pain Score 5    Pain Location Back  O.T. not addressing secondary to outside scope of practice                    OT Treatments/Exercises (OP) - 12/25/14 0913    Shoulder Exercises: ROM/Strengthening   Other ROM/Strengthening Exercises Pt performing theraband exercises in shoulder extension, flexion, horizontal abd, elbow flexion and extension bilaterally with modifications (lower range) for Rt shoulder in flexion due to old rotator cuff tear. Pt also had to make modifications to horizontal abduction due to Rt shoulder. Pt issued HEP with yellow band.                 OT Education - 12/25/14 [phone]    Education  provided Yes   Education Details theraband HEP (yellow), modifications to Rt shoulder    Person(s) Educated Patient   Methods Explanation;Demonstration;Handout   Comprehension Verbalized understanding;Returned demonstration          OT Short Term Goals - 12/25/14 0931    OT SHORT TERM GOAL #1   Title I with HEP   Baseline check 01/04/15   Time 4   Period Weeks   Status Achieved   OT SHORT TERM GOAL #2   Title Pt will demonstrate ability to perform functional reaching in standing x 15 mins without LOB or rest breaks.   Time 4   Period Weeks   Status New   OT SHORT TERM GOAL #3   Title Pt will verbalize understanding of adapted strategies for ADLS/IADLS to increase pt safety, independence and minimize spills   Time 4   Period Weeks   Status Achieved           OT Long Term Goals - 12/05/14 1723    OT LONG TERM GOAL #1   Title Pt will demonstrate improved  standing balance as evidenced by increasing standing functional reach by 2 inches bilaterally(from baseline.)   Baseline check 01/30/15   Time 8   Period Weeks   Status New   OT LONG TERM GOAL #2   Title Pt will demonstrate ability to perfom dynamic functional reaching activities in standing without LOB in prep for improved ADL/IADL performance.   Time 8   Period Weeks   Status New   OT LONG TERM GOAL #3   Title Pt will demonstrate improved fine motor coordination as evidenced by decreasing bilateral 9 hole peg test scores to 35 secs or less without any drops.   Time 8   Period Weeks   Status New   OT LONG TERM GOAL #4   Title Pt will demonstrate ability to write a short paragraph without decrease in letter size and 100% legibility.   Time 8   Period Weeks   Status New   OT LONG TERM GOAL #5   Title I with updated HEP.   Time 8   Period Weeks   Status New               Plan - 12/25/14 1120    Clinical Impression Statement Pt met STG's #1 and #3. Pt progressing towards STG #2. Pt reports old Rt rotator cuff damage which limits Rt shoulder for strengthening and required modifications for HEP issued today.    Plan dynamic standing balance, assess STG #2   Consulted and Agree with Plan of Care Patient        Problem List Patient Active Problem List   Diagnosis Date Noted  . Pacemaker 07/02/2013  . CAD (coronary artery disease) of artery bypass graft 02/16/2013  . HTN (hypertension) 02/16/2013  . Hyperlipidemia 02/16/2013  . Postoperative anemia due to acute blood loss 01/20/2013  . Postop Hyponatremia 01/18/2013  . OA (osteoarthritis) of knee 01/17/2013  . Atrial fibrillation 12/21/2012  . Long term (current) use of anticoagulants 12/21/2012    Carey Bullocks, OTR/L 12/25/2014, 11:28 AM  [facility] [address] [place], [name], [address] Phone: [phone]   Fax:  [phone]

## 2014-12-26 ENCOUNTER — Ambulatory Visit: Payer: Medicare Other | Admitting: Physical Therapy

## 2014-12-26 ENCOUNTER — Ambulatory Visit: Payer: Medicare Other | Admitting: Occupational Therapy

## 2014-12-26 DIAGNOSIS — R269 Unspecified abnormalities of gait and mobility: Secondary | ICD-10-CM

## 2014-12-26 DIAGNOSIS — R531 Weakness: Secondary | ICD-10-CM

## 2014-12-26 DIAGNOSIS — R278 Other lack of coordination: Secondary | ICD-10-CM

## 2014-12-26 DIAGNOSIS — R279 Unspecified lack of coordination: Secondary | ICD-10-CM

## 2014-12-26 DIAGNOSIS — Z7409 Other reduced mobility: Secondary | ICD-10-CM

## 2014-12-26 DIAGNOSIS — R2689 Other abnormalities of gait and mobility: Secondary | ICD-10-CM

## 2014-12-26 NOTE — Therapy (Signed)
[facility] [address] [place], [name], [address] Phone: [phone]   Fax:  [phone]  Physical Therapy Treatment  Patient Details  Name: Mike Price MRN: [id number] Date of Birth: 1935-06-12 Referring Provider:  [name], MD  Encounter Date: 12/26/2014      PT End of Session - 12/26/14 1315    Visit Number 6   Number of Visits 18   Date for PT Re-Evaluation 02/02/15   PT Start Time 1230   PT Stop Time 1313   PT Time Calculation (min) 43 min   Equipment Utilized During Treatment Gait belt   Activity Tolerance Patient tolerated treatment well   Behavior During Therapy [facility] for tasks assessed/performed      Past Medical History  Diagnosis Date  . Hypertension   . Thyroid disease   . TMJ tenderness 01-11-13    right side-has issues from time to time.  . Pacemaker 12/15/06    3'08  . Dysrhythmia 01-11-13     hx. A. Fib-Pacemaker implanted left chest  . Hypothyroidism   . Anxiety 01-11-13    tx. Clonazepam.  . Edema extremities 01-11-13    retains fluid in legs-right greater than left.  . Neuromuscular disorder     neuropathy in feet  . GERD (gastroesophageal reflux disease)   . Arthritis     Osteoarthritis-knees, fingers  . Anemia   . CAD (coronary artery disease)   . Hyperlipemia   . Obstructive sleep apnea     on C Pap    Past Surgical History  Procedure Laterality Date  . Coronary artery bypass graft  2001    4 vessels-'00  . Tonsillectomy      child  . Appendectomy    . Insert / replace / remove pacemaker      '08-inserted left chest  . Cervical laminectomy    . Hernia repair    . Joint replacement      LTHA  . Total knee arthroplasty Right 01/17/2013    Procedure: RIGHT TOTAL KNEE ARTHROPLASTY;  Surgeon: Gearlean Alf, MD;  Location: [facility];  Service: Orthopedics;  Laterality: Right;  . Cardiac catheterization  03/28/08    patent grafts, nl EF  . Pacemaker insertion  12/15/06   medtronic  . US echocardiography  12/28/2007    LA mod. dilated,RA mildly dilated  . Nm myocar perf wall motion  02/12/2012    small fixed distal anteroapical/apical defect. No reversible ischemia    There were no vitals filed for this visit.  Visit Diagnosis:  Weakness generalized  Balance problems  Impaired functional mobility and activity tolerance  Abnormality of gait                     [facility] Adult PT Treatment/Exercise - 12/26/14 1230    Transfers   Sit to Stand 5: Supervision;With upper extremity assist;With armrests;From chair/3-in-1   Sit to Stand Details (indicate cue type and reason) verbal cues on technique   Stand to Sit 5: Supervision;With upper extremity assist;With armrests;To chair/3-in-1   Stand to Sit Details verbal cues on technique   Ambulation/Gait   Ambulation/Gait Yes   Ambulation/Gait Assistance 5: Supervision   Ambulation/Gait Assistance Details demo & verbal cues on step length   Ambulation Distance (Feet) 125 Feet  5X    Assistive device Straight cane   Gait Pattern Step-through pattern;Decreased stride length;Decreased dorsiflexion - right;Decreased dorsiflexion - left;Trunk flexed   Ambulation Surface Level;Indoor  Ramp 4: Min assist  single point cane   Curb 4: Min assist  single point cane   Balance   Balance Assessed Yes   Dynamic Standing Balance   Dynamic Standing - Balance Support No upper extremity supported  in parallel bars for occassional touch   Dynamic Standing - Level of Assistance 4: Min assist  tactile cues   Dynamic Standing - Balance Activities Rocker board;Head turns;Head nods   High Level Balance   High Level Balance Activities Side stepping;Marching forwards;Marching backwards;Other (comment);Tandem walking  to review HEP intermittent UE support at counter    Exercises   Exercises Knee/Hip;Ankle   Knee/Hip Exercises: Stretches   Active Hamstring Stretch 3 reps;30 seconds   Gastroc Stretch 3 reps;30  seconds  seated with towel   Knee/Hip Exercises: Aerobic   Stationary Bike Sci Fit seated stepper UE/LE level 2 x 8 min   Knee/Hip Exercises: Standing   Other Standing Knee Exercises hip abduction x 10 each leg alternating holding onto counter   Other Standing Knee Exercises hip extension x 10 each leg holding counter   Knee/Hip Exercises: Seated   Other Seated Knee Exercises sit<>stand x 5 no UE support  educated at home to do only 5 prior to rest due to pain   Ankle Exercises: Standing   Heel Raises 10 reps;5 seconds  alternating with toe raises   Toe Walk (Round Trip) 3 laps near counter holding on                PT Education - 12/26/14 1230    Education provided Yes   Education Details Fitness program includes endurance, flexibility, strength and balance. Recommend endurance with recumbent machines using BLEs & BUEs.    Person(s) Educated Patient   Methods Explanation   Comprehension Verbalized understanding;Need further instruction          PT Short Term Goals - 12/18/14 1634    PT SHORT TERM GOAL #1   Title demonstrates understanding of initial HEP (Target Date: 01/04/15)   Time 30   Period Days   Status On-going   PT SHORT TERM GOAL #2   Title verbalizes understanding of fall prevention strategies in home (Target Date: 01/04/15)   Time 30   Period Days   Status On-going   PT SHORT TERM GOAL #3   Title ambulates 200' with single point cane with cues for gait. (Target Date: 01/04/15)   Time 30   Period Days   Status On-going   PT SHORT TERM GOAL #4   Title negotiates ramp & curb with single point cane with minimal assist. (Target Date: 01/04/15)   Time 30   Period Days   Status On-going           PT Long Term Goals - 12/18/14 1634    PT LONG TERM GOAL #1   Title verbalizes / demonstrates understanding of fitness plan /ongoing HEP. (Target Date: 02/02/15)   Time 60   Period Days   Status On-going   PT LONG TERM GOAL #2   Title ambulates 300' with  LRAD safely modified independent. (Target Date: 02/02/15)   Time 60   Period Days   Status On-going   PT LONG TERM GOAL #3   Title negotiates ramp, curb & stairs (1 rail) with LRAD modified independent. (Target Date: 02/02/15)   Time 60   Period Days   Status On-going   PT LONG TERM GOAL #4   Title Berg Balance >32/ 56 (Target Date: 02/02/15)  Time 60   Period Days   Status On-going   PT LONG TERM GOAL #5   Title Timed Up & Go with & without cane <13.5 seconds (Target Date: 02/02/15)   Time 60   Period Days   Status On-going               Plan - 12/26/14 1315    Clinical Impression Statement Patient would benefit from a fitness plan and Silver Sneakers program. Patient improved balance with tactile cues and repetition. Patient's gait was improved by 1/4" wedge in left shoe for leg length descripency.   Pt will benefit from skilled therapeutic intervention in order to improve on the following deficits Abnormal gait;Decreased activity tolerance;Decreased balance;Decreased endurance;Decreased knowledge of use of DME;Decreased mobility;Decreased range of motion;Decreased strength;Postural dysfunction   Rehab Potential Good   PT Frequency 2x / week   PT Duration Other (comment)  9 weeks   PT Treatment/Interventions ADLs/Self Care Home Management;DME Instruction;Gait training;Stair training;Functional mobility training;Therapeutic activities;Therapeutic exercise;Balance training;Neuromuscular re-education;Patient/family education   PT Next Visit Plan balance & gait    Consulted and Agree with Plan of Care Patient        Problem List Patient Active Problem List   Diagnosis Date Noted  . Pacemaker 07/02/2013  . CAD (coronary artery disease) of artery bypass graft 02/16/2013  . HTN (hypertension) 02/16/2013  . Hyperlipidemia 02/16/2013  . Postoperative anemia due to acute blood loss 01/20/2013  . Postop Hyponatremia 01/18/2013  . OA (osteoarthritis) of knee 01/17/2013  .  Atrial fibrillation 12/21/2012  . Long term (current) use of anticoagulants 12/21/2012    Shevy Yaney PT, DPT 12/26/2014, 9:45 PM  [facility] [address] [place], [name], [address] Phone: [phone]   Fax:  [phone]

## 2014-12-26 NOTE — Therapy (Signed)
[facility] [address] [place], [name], [address] Phone: [phone]   Fax:  [phone]  Occupational Therapy Treatment  Patient Details  Name: Mike Price MRN: [id number] Date of Birth: 1935/09/20 Referring Provider:  [name], MD  Encounter Date: 12/26/2014      OT End of Session - 12/26/14 1230    Visit Number 6  G6   Number of Visits 17   Date for OT Re-Evaluation 01/30/15   Authorization Type Medicare, U[employer] secondary   Authorization Time Period 60 days-01/30/15   OT Start Time 1145   OT Stop Time 1230   OT Time Calculation (min) 45 min   Activity Tolerance Patient tolerated treatment well      Past Medical History  Diagnosis Date  . Hypertension   . Thyroid disease   . TMJ tenderness 01-11-13    right side-has issues from time to time.  . Pacemaker 12/15/06    3'08  . Dysrhythmia 01-11-13     hx. A. Fib-Pacemaker implanted left chest  . Hypothyroidism   . Anxiety 01-11-13    tx. Clonazepam.  . Edema extremities 01-11-13    retains fluid in legs-right greater than left.  . Neuromuscular disorder     neuropathy in feet  . GERD (gastroesophageal reflux disease)   . Arthritis     Osteoarthritis-knees, fingers  . Anemia   . CAD (coronary artery disease)   . Hyperlipemia   . Obstructive sleep apnea     on C Pap    Past Surgical History  Procedure Laterality Date  . Coronary artery bypass graft  2001    4 vessels-'00  . Tonsillectomy      child  . Appendectomy    . Insert / replace / remove pacemaker      '08-inserted left chest  . Cervical laminectomy    . Hernia repair    . Joint replacement      LTHA  . Total knee arthroplasty Right 01/17/2013    Procedure: RIGHT TOTAL KNEE ARTHROPLASTY;  Surgeon: Gearlean Alf, MD;  Location: [facility];  Service: Orthopedics;  Laterality: Right;  . Cardiac catheterization  03/28/08    patent grafts, nl EF  . Pacemaker insertion  12/15/06    medtronic   . US echocardiography  12/28/2007    LA mod. dilated,RA mildly dilated  . Nm myocar perf wall motion  02/12/2012    small fixed distal anteroapical/apical defect. No reversible ischemia    There were no vitals filed for this visit.  Visit Diagnosis:  Impaired functional mobility and activity tolerance  Decreased coordination      Subjective Assessment - 12/25/14 0858    Symptoms "I wrenched my lower back"   Currently in Pain? Yes   Pain Score 5    Pain Location Back  O.T. not addressing secondary to outside scope of practice                    OT Treatments/Exercises (OP) - 12/26/14 1218    ADLs   Functional Mobility Pt performing dynamic functional reaching activities in standing at counter and at mat x 15 min. w/o rest or LOB. Pt performed reaching to floor, side reaching and front reaching bilaterally outside BOS. (did have pt in front of counter or at mat for safety in case of LOB d/t fall risk). Pt then rested for approx 1 min. and then performed bilateral reaching to side with side stepping, and  bilateral forward reaching with forward stepping.   Shoulder Exercises: ROM/Strengthening   Other ROM/Strengthening Exercises UBE x 5 min. for strength/endurance with initial cues for Rt shoulder positioning (to prevent abduction) with seat raised.    Fine Motor Coordination   Fine Motor Coordination Small Pegboard   Small Pegboard Pt placing small pegs in pegboard Rt hand with min drops. Pt copied design at 100% accuracy                OT Education - 12/25/14 0927    Education provided Yes   Education Details theraband HEP (yellow), modifications to Rt shoulder    Person(s) Educated Patient   Methods Explanation;Demonstration;Handout   Comprehension Verbalized understanding;Returned demonstration          OT Short Term Goals - 12/26/14 1233    OT SHORT TERM GOAL #1   Title I with HEP   Baseline check 01/04/15   Time 4   Period Weeks   Status Achieved    OT SHORT TERM GOAL #2   Title Pt will demonstrate ability to perform functional reaching in standing x 15 mins without LOB or rest breaks.   Time 4   Period Weeks   Status Achieved  With chair and/or countertop for close support prn   OT SHORT TERM GOAL #3   Title Pt will verbalize understanding of adapted strategies for ADLS/IADLS to increase pt safety, independence and minimize spills   Time 4   Period Weeks   Status Achieved           OT Long Term Goals - 12/05/14 1723    OT LONG TERM GOAL #1   Title Pt will demonstrate improved standing balance as evidenced by increasing standing functional reach by 2 inches bilaterally(from baseline.)   Baseline check 01/30/15   Time 8   Period Weeks   Status New   OT LONG TERM GOAL #2   Title Pt will demonstrate ability to perfom dynamic functional reaching activities in standing without LOB in prep for improved ADL/IADL performance.   Time 8   Period Weeks   Status New   OT LONG TERM GOAL #3   Title Pt will demonstrate improved fine motor coordination as evidenced by decreasing bilateral 9 hole peg test scores to 35 secs or less without any drops.   Time 8   Period Weeks   Status New   OT LONG TERM GOAL #4   Title Pt will demonstrate ability to write a short paragraph without decrease in letter size and 100% legibility.   Time 8   Period Weeks   Status New   OT LONG TERM GOAL #5   Title I with updated HEP.   Time 8   Period Weeks   Status New               Plan - 12/26/14 1233    Clinical Impression Statement Pt met STG #2 today. Pt has now met all STG's.    Plan work towards LTG's.    Consulted and Agree with Plan of Care Patient        Problem List Patient Active Problem List   Diagnosis Date Noted  . Pacemaker 07/02/2013  . CAD (coronary artery disease) of artery bypass graft 02/16/2013  . HTN (hypertension) 02/16/2013  . Hyperlipidemia 02/16/2013  . Postoperative anemia due to acute blood loss  01/20/2013  . Postop Hyponatremia 01/18/2013  . OA (osteoarthritis) of knee 01/17/2013  . Atrial fibrillation 12/21/2012  .  Long term (current) use of anticoagulants 12/21/2012    Carey Bullocks, OTR/L 12/26/2014, 12:35 PM  [facility] [address] [place], [name], [address] Phone: [phone]   Fax:  [phone]

## 2014-12-27 ENCOUNTER — Ambulatory Visit (INDEPENDENT_AMBULATORY_CARE_PROVIDER_SITE_OTHER): Payer: Medicare Other | Admitting: Pharmacist Clinician (PhC)/ Clinical Pharmacy Specialist

## 2014-12-27 DIAGNOSIS — Z7901 Long term (current) use of anticoagulants: Secondary | ICD-10-CM | POA: Diagnosis not present

## 2014-12-27 DIAGNOSIS — I4891 Unspecified atrial fibrillation: Secondary | ICD-10-CM | POA: Diagnosis not present

## 2014-12-27 LAB — POCT INR: INR: 2.6

## 2015-01-02 ENCOUNTER — Ambulatory Visit: Payer: Medicare Other | Admitting: Occupational Therapy

## 2015-01-02 ENCOUNTER — Ambulatory Visit: Payer: Medicare Other | Admitting: Physical Therapy

## 2015-01-02 ENCOUNTER — Encounter: Payer: Self-pay | Admitting: Physical Therapy

## 2015-01-02 DIAGNOSIS — R278 Other lack of coordination: Secondary | ICD-10-CM

## 2015-01-02 DIAGNOSIS — R279 Unspecified lack of coordination: Principal | ICD-10-CM

## 2015-01-02 DIAGNOSIS — R269 Unspecified abnormalities of gait and mobility: Secondary | ICD-10-CM

## 2015-01-02 DIAGNOSIS — R531 Weakness: Secondary | ICD-10-CM

## 2015-01-02 DIAGNOSIS — R2689 Other abnormalities of gait and mobility: Secondary | ICD-10-CM

## 2015-01-02 NOTE — Therapy (Signed)
[facility] [address] [place], [name], [address] Phone: [phone]   Fax:  [phone]  Occupational Therapy Treatment  Patient Details  Name: Mike Price MRN: [id number] Date of Birth: 21-Dec-1934 Referring Provider:  [name], MD  Encounter Date: 01/02/2015      OT End of Session - 01/02/15 1158    Visit Number 7   Number of Visits 17   Date for OT Re-Evaluation 01/30/15   Authorization Type Medicare, [employer] secondary   Authorization Time Period 60 days-01/30/15   OT Start Time 1151   OT Stop Time 1230   OT Time Calculation (min) 39 min   Activity Tolerance Patient tolerated treatment well   Behavior During Therapy [name] for tasks assessed/performed      Past Medical History  Diagnosis Date  . Hypertension   . Thyroid disease   . TMJ tenderness 01-11-13    right side-has issues from time to time.  . Pacemaker 12/15/06    3'08  . Dysrhythmia 01-11-13     hx. A. Fib-Pacemaker implanted left chest  . Hypothyroidism   . Anxiety 01-11-13    tx. Clonazepam.  . Edema extremities 01-11-13    retains fluid in legs-right greater than left.  . Neuromuscular disorder     neuropathy in feet  . GERD (gastroesophageal reflux disease)   . Arthritis     Osteoarthritis-knees, fingers  . Anemia   . CAD (coronary artery disease)   . Hyperlipemia   . Obstructive sleep apnea     on C Pap    Past Surgical History  Procedure Laterality Date  . Coronary artery bypass graft  2001    4 vessels-'00  . Tonsillectomy      child  . Appendectomy    . Insert / replace / remove pacemaker      '08-inserted left chest  . Cervical laminectomy    . Hernia repair    . Joint replacement      LTHA  . Total knee arthroplasty Right 01/17/2013    Procedure: RIGHT TOTAL KNEE ARTHROPLASTY;  Surgeon: Gearlean Alf, MD;  Location: [facility];  Service: Orthopedics;  Laterality: Right;  . Cardiac catheterization  03/28/08    patent  grafts, nl EF  . Pacemaker insertion  12/15/06    medtronic  . US echocardiography  12/28/2007    LA mod. dilated,RA mildly dilated  . Nm myocar perf wall motion  02/12/2012    small fixed distal anteroapical/apical defect. No reversible ischemia    There were no vitals filed for this visit.  Visit Diagnosis:  Decreased coordination  Weakness generalized      Subjective Assessment - 01/02/15 1155    Symptoms Pt reports lower back pain   Currently in Pain? Yes   Pain Score 2    Pain Location Back   Pain Orientation Lower   Pain Descriptors / Indicators Aching   Pain Type Acute pain   Pain Onset More than a month ago   Pain Frequency Intermittent   Aggravating Factors  malpositioning   Pain Relieving Factors reposistioning   Multiple Pain Sites No           Treatment: Reviewed yellow theraband exercises 10 reps each, bilateral UE's, min v.c for positioning  Instructed pt in use of buttonhook, he returned demonstration.Pt was provided with info for purchase. Handwriting activities with gel pen. Following min v.c. initally, pt was able to write several sentences with 100% legibility and  min decrease in letter size.                    OT Short Term Goals - 12/26/14 1233    OT SHORT TERM GOAL #1   Title I with HEP   Baseline check 01/04/15   Time 4   Period Weeks   Status Achieved   OT SHORT TERM GOAL #2   Title Pt will demonstrate ability to perform functional reaching in standing x 15 mins without LOB or rest breaks.   Time 4   Period Weeks   Status Achieved  With chair and/or countertop for close support prn   OT SHORT TERM GOAL #3   Title Pt will verbalize understanding of adapted strategies for ADLS/IADLS to increase pt safety, independence and minimize spills   Time 4   Period Weeks   Status Achieved           OT Long Term Goals - 12/05/14 1723    OT LONG TERM GOAL #1   Title Pt will demonstrate improved standing balance as evidenced by  increasing standing functional reach by 2 inches bilaterally(from baseline.)   Baseline check 01/30/15   Time 8   Period Weeks   Status New   OT LONG TERM GOAL #2   Title Pt will demonstrate ability to perfom dynamic functional reaching activities in standing without LOB in prep for improved ADL/IADL performance.   Time 8   Period Weeks   Status New   OT LONG TERM GOAL #3   Title Pt will demonstrate improved fine motor coordination as evidenced by decreasing bilateral 9 hole peg test scores to 35 secs or less without any drops.   Time 8   Period Weeks   Status New   OT LONG TERM GOAL #4   Title Pt will demonstrate ability to write a short paragraph without decrease in letter size and 100% legibility.   Time 8   Period Weeks   Status New   OT LONG TERM GOAL #5   Title I with updated HEP.   Time 8   Period Weeks   Status New               Plan - 01/02/15 1347    Clinical Impression Statement Pt is progressing towards goals.   Rehab Potential Good   Clinical Impairments Affecting Rehab Potential decreased coordination, tremor, decreased strength, decreased activity tolerance   OT Frequency 2x / week   OT Duration 8 weeks   Plan continue to address LTG's   Consulted and Agree with Plan of Care Patient        Problem List Patient Active Problem List   Diagnosis Date Noted  . Pacemaker 07/02/2013  . CAD (coronary artery disease) of artery bypass graft 02/16/2013  . HTN (hypertension) 02/16/2013  . Hyperlipidemia 02/16/2013  . Postoperative anemia due to acute blood loss 01/20/2013  . Postop Hyponatremia 01/18/2013  . OA (osteoarthritis) of knee 01/17/2013  . Atrial fibrillation 12/21/2012  . Long term (current) use of anticoagulants 12/21/2012    Cordie Buening 01/02/2015, 1:50 PM Theone Murdoch, OTR/L [phone] Phone: [phone] 1:50 PM 01/02/2015 [facility] [address] [place], [name], [address] Phone: [phone]   Fax:  [phone]

## 2015-01-03 NOTE — Therapy (Signed)
[facility] [address] [place], [name], [address] Phone: [phone]   Fax:  [phone]  Physical Therapy Treatment  Patient Details  Name: Mike Price MRN: [id number] Date of Birth: 08-13-1935 Referring Provider:  [name], MD  Encounter Date: 01/02/2015      PT End of Session - 01/02/15 1239    Visit Number 7   Number of Visits 18   Date for PT Re-Evaluation 02/02/15   PT Start Time 1234   PT Stop Time 1315   PT Time Calculation (min) 41 min   Equipment Utilized During Treatment Gait belt   Activity Tolerance Patient tolerated treatment well   Behavior During Therapy [facility] for tasks assessed/performed      Past Medical History  Diagnosis Date  . Hypertension   . Thyroid disease   . TMJ tenderness 01-11-13    right side-has issues from time to time.  . Pacemaker 12/15/06    3'08  . Dysrhythmia 01-11-13     hx. A. Fib-Pacemaker implanted left chest  . Hypothyroidism   . Anxiety 01-11-13    tx. Clonazepam.  . Edema extremities 01-11-13    retains fluid in legs-right greater than left.  . Neuromuscular disorder     neuropathy in feet  . GERD (gastroesophageal reflux disease)   . Arthritis     Osteoarthritis-knees, fingers  . Anemia   . CAD (coronary artery disease)   . Hyperlipemia   . Obstructive sleep apnea     on C Pap    Past Surgical History  Procedure Laterality Date  . Coronary artery bypass graft  2001    4 vessels-'00  . Tonsillectomy      child  . Appendectomy    . Insert / replace / remove pacemaker      '08-inserted left chest  . Cervical laminectomy    . Hernia repair    . Joint replacement      LTHA  . Total knee arthroplasty Right 01/17/2013    Procedure: RIGHT TOTAL KNEE ARTHROPLASTY;  Surgeon: Gearlean Alf, MD;  Location: [facility];  Service: Orthopedics;  Laterality: Right;  . Cardiac catheterization  03/28/08    patent grafts, nl EF  . Pacemaker insertion  12/15/06   medtronic  . US echocardiography  12/28/2007    LA mod. dilated,RA mildly dilated  . Nm myocar perf wall motion  02/12/2012    small fixed distal anteroapical/apical defect. No reversible ischemia    There were no vitals filed for this visit.  Visit Diagnosis:  Balance problems  Abnormality of gait      Subjective Assessment - 01/02/15 1236    Symptoms No new complaints. No falls. Did see John at [employer]. Has new shoes coming with velcro closures vs the laces he is currently wearing (which are new as well).   Currently in Pain? Yes   Pain Score 2    Pain Location Back   Pain Orientation Lower   Pain Descriptors / Indicators Aching;Sore   Pain Type Acute pain   Pain Onset More than a month ago   Pain Frequency Intermittent   Aggravating Factors  poor posture, poor sleeping position   Pain Relieving Factors repositioning          [facility] Adult PT Treatment/Exercise - 01/02/15 1248    Transfers   Sit to Stand 5: Supervision;With upper extremity assist;With armrests;From chair/3-in-1   Stand to Sit 5: Supervision;With upper extremity assist;With armrests;To chair/3-in-1  Ambulation/Gait   Ambulation/Gait Assistance 5: Supervision   Ambulation/Gait Assistance Details cues on increased stride length and on posture (to look up) with gait   Ambulation Distance (Feet) 440 Feet  x1   Assistive device Straight cane   Gait Pattern Step-through pattern;Decreased stride length;Trunk flexed   Ambulation Surface Level;Indoor   Dynamic Standing Balance   Dynamic Standing - Balance Support Right upper extremity supported;During functional activity   Dynamic Standing - Level of Assistance 4: Min assist   Dynamic Standing - Balance Activities Alternating  foot traps   Dynamic Standing - Comments single leg stance activites with tall cones: alternating forward toe taps, cross toe taps, forward double toe taps, cross double toe taps and flip over/up with cane and up to min assist for balance  and cues on weight shift for stance stability/balance.                                                                    PT Short Term Goals - 12/18/14 1634    PT SHORT TERM GOAL #1   Title demonstrates understanding of initial HEP (Target Date: 01/04/15)   Time 30   Period Days   Status On-going   PT SHORT TERM GOAL #2   Title verbalizes understanding of fall prevention strategies in home (Target Date: 01/04/15)   Time 30   Period Days   Status On-going   PT SHORT TERM GOAL #3   Title ambulates 200' with single point cane with cues for gait. (Target Date: 01/04/15)   Time 30   Period Days   Status On-going   PT SHORT TERM GOAL #4   Title negotiates ramp & curb with single point cane with minimal assist. (Target Date: 01/04/15)   Time 30   Period Days   Status On-going           PT Long Term Goals - 12/18/14 1634    PT LONG TERM GOAL #1   Title verbalizes / demonstrates understanding of fitness plan /ongoing HEP. (Target Date: 02/02/15)   Time 60   Period Days   Status On-going   PT LONG TERM GOAL #2   Title ambulates 300' with LRAD safely modified independent. (Target Date: 02/02/15)   Time 60   Period Days   Status On-going   PT LONG TERM GOAL #3   Title negotiates ramp, curb & stairs (1 rail) with LRAD modified independent. (Target Date: 02/02/15)   Time 60   Period Days   Status On-going   PT LONG TERM GOAL #4   Title Berg Balance >32/ 56 (Target Date: 02/02/15)   Time 60   Period Days   Status On-going   PT LONG TERM GOAL #5   Title Timed Up & Go with & without cane <13.5 seconds (Target Date: 02/02/15)   Time 60   Period Days   Status On-going           Plan - 01/02/15 1240    Clinical Impression Statement Pt still planning to look into fitness program such as Silver [employer]neakers, just has not at this time. Progressing toward goals.   Pt will benefit from skilled therapeutic intervention in order to improve on the following deficits Abnormal  gait;Decreased activity tolerance;Decreased balance;Decreased  endurance;Decreased knowledge of use of DME;Decreased mobility;Decreased range of motion;Decreased strength;Postural dysfunction   Rehab Potential Good   PT Frequency 2x / week   PT Duration Other (comment)  9 weeks   PT Treatment/Interventions ADLs/Self Care Home Management;DME Instruction;Gait training;Stair training;Functional mobility training;Therapeutic activities;Therapeutic exercise;Balance training;Neuromuscular re-education;Patient/family education   PT Next Visit Plan Assess STG's next visit.   Consulted and Agree with Plan of Care Patient        Problem List Patient Active Problem List   Diagnosis Date Noted  . Pacemaker 07/02/2013  . CAD (coronary artery disease) of artery bypass graft 02/16/2013  . HTN (hypertension) 02/16/2013  . Hyperlipidemia 02/16/2013  . Postoperative anemia due to acute blood loss 01/20/2013  . Postop Hyponatremia 01/18/2013  . OA (osteoarthritis) of knee 01/17/2013  . Atrial fibrillation 12/21/2012  . Long term (current) use of anticoagulants 12/21/2012    Willow Ora 01/03/2015, 11:57 AM  Willow Ora, PTA, [facility] [address] [place], [place] [address] [phone] 01/03/2015, 11:57 AM

## 2015-01-04 ENCOUNTER — Encounter: Payer: Self-pay | Admitting: Physical Therapy

## 2015-01-04 ENCOUNTER — Ambulatory Visit: Payer: Medicare Other | Admitting: Occupational Therapy

## 2015-01-04 ENCOUNTER — Ambulatory Visit: Payer: Medicare Other | Admitting: Physical Therapy

## 2015-01-04 DIAGNOSIS — R279 Unspecified lack of coordination: Secondary | ICD-10-CM | POA: Diagnosis not present

## 2015-01-04 DIAGNOSIS — R278 Other lack of coordination: Secondary | ICD-10-CM

## 2015-01-04 DIAGNOSIS — Z7409 Other reduced mobility: Secondary | ICD-10-CM

## 2015-01-04 DIAGNOSIS — R269 Unspecified abnormalities of gait and mobility: Secondary | ICD-10-CM

## 2015-01-04 DIAGNOSIS — R531 Weakness: Secondary | ICD-10-CM

## 2015-01-04 DIAGNOSIS — R2689 Other abnormalities of gait and mobility: Secondary | ICD-10-CM

## 2015-01-04 NOTE — Therapy (Signed)
[name] [address] [place], [name], [address] Phone: [phone]   Fax:  [phone]  Physical Therapy Treatment  Patient Details  Name: Mike Price MRN: [id number] Date of Birth: 1935-03-19 Referring Provider:  [name], MD  Encounter Date: 01/04/2015      PT End of Session - 01/04/15 0856    Visit Number 8   Number of Visits 18   Date for PT Re-Evaluation 02/02/15   PT Start Time 0850   PT Stop Time 0928   PT Time Calculation (min) 38 min   Equipment Utilized During Treatment Gait belt   Activity Tolerance Patient tolerated treatment well   Behavior During Therapy [facility] for tasks assessed/performed      Past Medical History  Diagnosis Date  . Hypertension   . Thyroid disease   . TMJ tenderness 01-11-13    right side-has issues from time to time.  . Pacemaker 12/15/06    3'08  . Dysrhythmia 01-11-13     hx. A. Fib-Pacemaker implanted left chest  . Hypothyroidism   . Anxiety 01-11-13    tx. Clonazepam.  . Edema extremities 01-11-13    retains fluid in legs-right greater than left.  . Neuromuscular disorder     neuropathy in feet  . GERD (gastroesophageal reflux disease)   . Arthritis     Osteoarthritis-knees, fingers  . Anemia   . CAD (coronary artery disease)   . Hyperlipemia   . Obstructive sleep apnea     on C Pap    Past Surgical History  Procedure Laterality Date  . Coronary artery bypass graft  2001    4 vessels-'00  . Tonsillectomy      child  . Appendectomy    . Insert / replace / remove pacemaker      '08-inserted left chest  . Cervical laminectomy    . Hernia repair    . Joint replacement      LTHA  . Total knee arthroplasty Right 01/17/2013    Procedure: RIGHT TOTAL KNEE ARTHROPLASTY;  Surgeon: Gearlean Alf, MD;  Location: [facility];  Service: Orthopedics;  Laterality: Right;  . Cardiac catheterization  03/28/08    patent grafts, nl EF  . Pacemaker insertion  12/15/06     medtronic  . US echocardiography  12/28/2007    LA mod. dilated,RA mildly dilated  . Nm myocar perf wall motion  02/12/2012    small fixed distal anteroapical/apical defect. No reversible ischemia    There were no vitals filed for this visit.  Visit Diagnosis:  Weakness generalized  Abnormality of gait  Impaired functional mobility and activity tolerance      Subjective Assessment - 01/04/15 0854    Symptoms No new complaints. No falls. Still waiting on shoes with velcro closure to come in. Feels that sleeping on back with pillow under his knees has helped a lot with his pain.   Currently in Pain? No/denies   Pain Score 0-No pain          [facility] Adult PT Treatment/Exercise - 01/04/15 0919    Transfers   Sit to Stand 5: Supervision;With upper extremity assist;With armrests;From chair/3-in-1   Stand to Sit 5: Supervision;With upper extremity assist;With armrests;To chair/3-in-1   Ambulation/Gait   Ambulation/Gait Yes   Ambulation/Gait Assistance 5: Supervision   Ambulation/Gait Assistance Details occasional cues on posture and cane placement   Ambulation Distance (Feet) 440 Feet   Assistive device Straight cane   Gait Pattern  Step-through pattern;Decreased stride length;Trunk flexed   Ambulation Surface Level;Indoor   Ramp 5: Supervision  with cane   Ramp Details (indicate cue type and reason) cues on posture and step length   Curb 4: Min assist  with cane   Curb Details (indicate cue type and reason) cues on sequence and assist needed for balance      Treatment: Reviewed all of the exercises issued to date for pt's HEP. Provided cues on correct ex form and technique as pt demo 'd them in clinic. Educated on fall prevention strategies and provided handout.          PT Short Term Goals - 01/04/15 1601    PT SHORT TERM GOAL #1   Title demonstrates understanding of initial HEP (Target Date: 01/04/15)   Baseline met on 01/04/15   Status Achieved   PT SHORT TERM GOAL #2    Title verbalizes understanding of fall prevention strategies in home (Target Date: 01/04/15)   Baseline met on 01/04/15   Status Achieved   PT SHORT TERM GOAL #3   Title ambulates 200' with single point cane with cues for gait. (Target Date: 01/04/15)   Baseline met on 01/04/15   Status Achieved   PT SHORT TERM GOAL #4   Title negotiates ramp & curb with single point cane with minimal assist. (Target Date: 01/04/15)   Baseline met on 01/04/15   Status Achieved           PT Long Term Goals - 12/18/14 1634    PT LONG TERM GOAL #1   Title verbalizes / demonstrates understanding of fitness plan /ongoing HEP. (Target Date: 02/02/15)   Time 60   Period Days   Status On-going   PT LONG TERM GOAL #2   Title ambulates 300' with LRAD safely modified independent. (Target Date: 02/02/15)   Time 60   Period Days   Status On-going   PT LONG TERM GOAL #3   Title negotiates ramp, curb & stairs (1 rail) with LRAD modified independent. (Target Date: 02/02/15)   Time 60   Period Days   Status On-going   PT LONG TERM GOAL #4   Title Berg Balance >32/ 56 (Target Date: 02/02/15)   Time 60   Period Days   Status On-going   PT LONG TERM GOAL #5   Title Timed Up & Go with & without cane <13.5 seconds (Target Date: 02/02/15)   Time 60   Period Days   Status On-going           Plan - 01/04/15 0856    Clinical Impression Statement All STG's met today. Pt progressing toward LTG as well.   Pt will benefit from skilled therapeutic intervention in order to improve on the following deficits Abnormal gait;Decreased activity tolerance;Decreased balance;Decreased endurance;Decreased knowledge of use of DME;Decreased mobility;Decreased range of motion;Decreased strength;Postural dysfunction   Rehab Potential Good   PT Frequency 2x / week   PT Duration Other (comment)  9 weeks   PT Treatment/Interventions ADLs/Self Care Home Management;DME Instruction;Gait training;Stair training;Functional mobility  training;Therapeutic activities;Therapeutic exercise;Balance training;Neuromuscular re-education;Patient/family education   PT Next Visit Plan continue toward LTG's   Consulted and Agree with Plan of Care Patient      Problem List Patient Active Problem List   Diagnosis Date Noted  . Pacemaker 07/02/2013  . CAD (coronary artery disease) of artery bypass graft 02/16/2013  . HTN (hypertension) 02/16/2013  . Hyperlipidemia 02/16/2013  . Postoperative anemia due to acute blood loss 01/20/2013  .  Postop Hyponatremia 01/18/2013  . OA (osteoarthritis) of knee 01/17/2013  . Atrial fibrillation 12/21/2012  . Long term (current) use of anticoagulants 12/21/2012    Willow Ora 01/05/2015, 10:37 AM  Willow Ora, PTA, [facility] [address] [place], [place] [address] [phone] 01/05/2015, 10:37 AM

## 2015-01-04 NOTE — Patient Instructions (Signed)

## 2015-01-04 NOTE — Therapy (Signed)
[facility] [address] [place], [name], [address] Phone: [phone]   Fax:  [phone]  Occupational Therapy Treatment  Patient Details  Name: Mike Price MRN: [id number] Date of Birth: 1934/12/25 Referring Provider:  [name], MD  Encounter Date: 01/04/2015      OT End of Session - 01/04/15 0934    Visit Number 8   Number of Visits 17   Date for OT Re-Evaluation 01/30/15   Authorization Type Medicare, UHC secondary   Authorization Time Period 60 days-01/30/15   Authorization - Visit Number 8   Authorization - Number of Visits 10   OT Start Time [phone]   OT Stop Time 1015   OT Time Calculation (min) 44 min   Activity Tolerance Patient tolerated treatment well   Behavior During Therapy [facility] for tasks assessed/performed      Past Medical History  Diagnosis Date  . Hypertension   . Thyroid disease   . TMJ tenderness 01-11-13    right side-has issues from time to time.  . Pacemaker 12/15/06    3'08  . Dysrhythmia 01-11-13     hx. A. Fib-Pacemaker implanted left chest  . Hypothyroidism   . Anxiety 01-11-13    tx. Clonazepam.  . Edema extremities 01-11-13    retains fluid in legs-right greater than left.  . Neuromuscular disorder     neuropathy in feet  . GERD (gastroesophageal reflux disease)   . Arthritis     Osteoarthritis-knees, fingers  . Anemia   . CAD (coronary artery disease)   . Hyperlipemia   . Obstructive sleep apnea     on C Pap    Past Surgical History  Procedure Laterality Date  . Coronary artery bypass graft  2001    4 vessels-'00  . Tonsillectomy      child  . Appendectomy    . Insert / replace / remove pacemaker      '08-inserted left chest  . Cervical laminectomy    . Hernia repair    . Joint replacement      LTHA  . Total knee arthroplasty Right 01/17/2013    Procedure: RIGHT TOTAL KNEE ARTHROPLASTY;  Surgeon: Gearlean Alf, MD;  Location: [facility];  Service: Orthopedics;   Laterality: Right;  . Cardiac catheterization  03/28/08    patent grafts, nl EF  . Pacemaker insertion  12/15/06    medtronic  . US echocardiography  12/28/2007    LA mod. dilated,RA mildly dilated  . Nm myocar perf wall motion  02/12/2012    small fixed distal anteroapical/apical defect. No reversible ischemia    There were no vitals filed for this visit.  Visit Diagnosis:  Decreased coordination  Weakness generalized  Balance problems      Subjective Assessment - 01/04/15 0934    Currently in Pain? No/denies       Treatment:  Arm bike 6 mins level 3 for conditioning, pt able to maintain 40 RPM. Dynamic standing activity to copy small peg design with bilateral UE's  For increased fine motor coordination, min difficulty/ supervision no LOB. Removing pegs with emphasis on in hand manipulation, min v.c. To avoid compensation. Dealing playing cards with thumb, arms supported on tabletop, min difficulty/ v.c. to avoid compensation. Handwriting with RUE supported on tabletop, pt was able to write 4 sentences legibly without decrease in letter size.  OT Short Term Goals - 12/26/14 1233    OT SHORT TERM GOAL #1   Title I with HEP   Baseline check 01/04/15   Time 4   Period Weeks   Status Achieved   OT SHORT TERM GOAL #2   Title Pt will demonstrate ability to perform functional reaching in standing x 15 mins without LOB or rest breaks.   Time 4   Period Weeks   Status Achieved  With chair and/or countertop for close support prn   OT SHORT TERM GOAL #3   Title Pt will verbalize understanding of adapted strategies for ADLS/IADLS to increase pt safety, independence and minimize spills   Time 4   Period Weeks   Status Achieved           OT Long Term Goals - 01/04/15 1133    OT LONG TERM GOAL #1   Title Pt will demonstrate improved standing balance as evidenced by increasing standing functional reach by 2 inches bilaterally(from baseline.)    Baseline check 01/30/15   Time 8   Period Weeks   Status New   OT LONG TERM GOAL #2   Title Pt will demonstrate ability to perfom dynamic functional reaching activities in standing without LOB in prep for improved ADL/IADL performance.   Time 8   Period Weeks   Status New   OT LONG TERM GOAL #3   Title Pt will demonstrate improved fine motor coordination as evidenced by decreasing bilateral 9 hole peg test scores to 35 secs or less without any drops.   Time 8   Period Weeks   Status New   OT LONG TERM GOAL #4   Title Pt will demonstrate ability to write a short paragraph without decrease in letter size and 100% legibility.   Baseline met 01/04/15   Time 8   Period Weeks   Status Achieved   OT LONG TERM GOAL #5   Title I with updated HEP.   Time 8   Period Weeks   Status New               Plan - 01/04/15 0935    Clinical Impression Statement Pt is progressing towards goals for strength, coordination and endurance.   Rehab Potential Good   Clinical Impairments Affecting Rehab Potential decreased coordination, tremor, decreased strength, decreased activity tolerance   OT Frequency 2x / week   OT Duration 8 weeks   Plan continue to work towards  [place] Pt has theraband HEP   Consulted and Agree with Plan of Care Patient        Problem List Patient Active Problem List   Diagnosis Date Noted  . Pacemaker 07/02/2013  . CAD (coronary artery disease) of artery bypass graft 02/16/2013  . HTN (hypertension) 02/16/2013  . Hyperlipidemia 02/16/2013  . Postoperative anemia due to acute blood loss 01/20/2013  . Postop Hyponatremia 01/18/2013  . OA (osteoarthritis) of knee 01/17/2013  . Atrial fibrillation 12/21/2012  . Long term (current) use of anticoagulants 12/21/2012    RINE,KATHRYN 01/04/2015, 11:34 AM Theone Murdoch, OTR/L [phone] Phone: [phone] 11:34 AM 01/04/2015 [facility] [address] [place], [name], [address] Phone: [phone]   Fax:  [phone]

## 2015-01-09 ENCOUNTER — Ambulatory Visit: Payer: Medicare Other | Attending: Internal Medicine | Admitting: Physical Therapy

## 2015-01-09 ENCOUNTER — Encounter: Payer: Self-pay | Admitting: Physical Therapy

## 2015-01-09 ENCOUNTER — Ambulatory Visit: Payer: Medicare Other | Admitting: Occupational Therapy

## 2015-01-09 DIAGNOSIS — M259 Joint disorder, unspecified: Secondary | ICD-10-CM | POA: Insufficient documentation

## 2015-01-09 DIAGNOSIS — R2689 Other abnormalities of gait and mobility: Secondary | ICD-10-CM

## 2015-01-09 DIAGNOSIS — R29818 Other symptoms and signs involving the nervous system: Secondary | ICD-10-CM | POA: Insufficient documentation

## 2015-01-09 DIAGNOSIS — R278 Other lack of coordination: Secondary | ICD-10-CM

## 2015-01-09 DIAGNOSIS — R531 Weakness: Secondary | ICD-10-CM | POA: Diagnosis not present

## 2015-01-09 DIAGNOSIS — R269 Unspecified abnormalities of gait and mobility: Secondary | ICD-10-CM

## 2015-01-09 DIAGNOSIS — R279 Unspecified lack of coordination: Secondary | ICD-10-CM | POA: Insufficient documentation

## 2015-01-09 DIAGNOSIS — Z7409 Other reduced mobility: Secondary | ICD-10-CM

## 2015-01-09 DIAGNOSIS — R29898 Other symptoms and signs involving the musculoskeletal system: Secondary | ICD-10-CM

## 2015-01-09 NOTE — Therapy (Signed)
[facility] [address] [place], [name], [address] Phone: [phone]   Fax:  [phone]  Occupational Therapy Treatment  Patient Details  Name: Mike Price MRN: [id number] Date of Birth: [birth year] Referring Provider:  [name], MD  Encounter Date: 01/09/2015      OT End of Session - 01/09/15 1337    Visit Number 9   Number of Visits 17   Date for OT Re-Evaluation 01/30/15   Authorization Type Medicare, UHC secondary   Authorization Time Period 60 days-01/30/15   OT Start Time 1318   OT Stop Time 1400   OT Time Calculation (min) 42 min   Activity Tolerance Patient tolerated treatment well   Behavior During Therapy [facility] for tasks assessed/performed      Past Medical History  Diagnosis Date  . Hypertension   . Thyroid disease   . TMJ tenderness 01-11-13    right side-has issues from time to time.  . Pacemaker 12/15/06    3'08  . Dysrhythmia 01-11-13     hx. A. Fib-Pacemaker implanted left chest  . Hypothyroidism   . Anxiety 01-11-13    tx. Clonazepam.  . Edema extremities 01-11-13    retains fluid in legs-right greater than left.  . Neuromuscular disorder     neuropathy in feet  . GERD (gastroesophageal reflux disease)   . Arthritis     Osteoarthritis-knees, fingers  . Anemia   . CAD (coronary artery disease)   . Hyperlipemia   . Obstructive sleep apnea     on C Pap    Past Surgical History  Procedure Laterality Date  . Coronary artery bypass graft  2001    4 vessels-'00  . Tonsillectomy      child  . Appendectomy    . Insert / replace / remove pacemaker      '08-inserted left chest  . Cervical laminectomy    . Hernia repair    . Joint replacement      LTHA  . Total knee arthroplasty Right 01/17/2013    Procedure: RIGHT TOTAL KNEE ARTHROPLASTY;  Surgeon: Gearlean Alf, MD;  Location: [facility];  Service: Orthopedics;  Laterality: Right;  . Cardiac catheterization  03/28/08    patent grafts,  nl EF  . Pacemaker insertion  12/15/06    medtronic  . US echocardiography  12/28/2007    LA mod. dilated,RA mildly dilated  . Nm myocar perf wall motion  02/12/2012    small fixed distal anteroapical/apical defect. No reversible ischemia    There were no vitals filed for this visit.  Visit Diagnosis:  Decreased coordination  Weakness generalized  Balance problems  Impaired functional mobility and activity tolerance      Subjective Assessment - 01/09/15 1324    Currently in Pain? No/denies        Treatment: Dynamic standing balance for functional reaching reaching to place/ remove graded clothespins from vertical antennae, and placing removing large pegs form vertical pegboard with trunk rotation and side stepping, min v.c., no LOB. Fine motor coordination task to place remove grooved pegs from pegboard, with emphasis on in hand manipulation, min difficulty/ v.c. For each ahdn.                      OT Short Term Goals - 12/26/14 1233    OT SHORT TERM GOAL #1   Title I with HEP   Baseline check 01/04/15   Time 4   Period Weeks  Status Achieved   OT SHORT TERM GOAL #2   Title Pt will demonstrate ability to perform functional reaching in standing x 15 mins without LOB or rest breaks.   Time 4   Period Weeks   Status Achieved  With chair and/or countertop for close support prn   OT SHORT TERM GOAL #3   Title Pt will verbalize understanding of adapted strategies for ADLS/IADLS to increase pt safety, independence and minimize spills   Time 4   Period Weeks   Status Achieved           OT Long Term Goals - 01/04/15 1133    OT LONG TERM GOAL #1   Title Pt will demonstrate improved standing balance as evidenced by increasing standing functional reach by 2 inches bilaterally(from baseline.)   Baseline check 01/30/15   Time 8   Period Weeks   Status New   OT LONG TERM GOAL #2   Title Pt will demonstrate ability to perfom dynamic functional reaching  activities in standing without LOB in prep for improved ADL/IADL performance.   Time 8   Period Weeks   Status New   OT LONG TERM GOAL #3   Title Pt will demonstrate improved fine motor coordination as evidenced by decreasing bilateral 9 hole peg test scores to 35 secs or less without any drops.   Time 8   Period Weeks   Status New   OT LONG TERM GOAL #4   Title Pt will demonstrate ability to write a short paragraph without decrease in letter size and 100% legibility.   Baseline met 01/04/15   Time 8   Period Weeks   Status Achieved   OT LONG TERM GOAL #5   Title I with updated HEP.   Time 8   Period Weeks   Status New               Plan - 01/09/15 1338    Clinical Impression Statement Pt is progressing towards goals with improving standing balance.   Rehab Potential Good   Clinical Impairments Affecting Rehab Potential decreased coordination, tremor, decreased strength, decreased activity tolerance   OT Frequency 2x / week   OT Duration 8 weeks   Plan continue work towards long term    Consulted and Agree with Plan of Care Patient        Problem List Patient Active Problem List   Diagnosis Date Noted  . Pacemaker 07/02/2013  . CAD (coronary artery disease) of artery bypass graft 02/16/2013  . HTN (hypertension) 02/16/2013  . Hyperlipidemia 02/16/2013  . Postoperative anemia due to acute blood loss 01/20/2013  . Postop Hyponatremia 01/18/2013  . OA (osteoarthritis) of knee 01/17/2013  . Atrial fibrillation 12/21/2012  . Long term (current) use of anticoagulants 12/21/2012    Nathen Balaban 01/09/2015, 1:44 PM Theone Murdoch, OTR/L [phone] Phone: [phone] 1:45 PM 01/09/2015 Theone Murdoch, OTR/L [phone] Phone: [phone] 9:03 AM 01/10/2015 [facility] [address] [place], [name], [address] Phone: [phone]   Fax:  [phone]

## 2015-01-10 NOTE — Therapy (Signed)
[facility] [address] [place], [name], [address] Phone: [phone]   Fax:  [phone]  Physical Therapy Treatment  Patient Details  Name: Mike Price MRN: [id number] Date of Birth: 05-Dec-1934 Referring Provider:  [name], MD  Encounter Date: 01/09/2015      PT End of Session - 01/09/15 1230    Visit Number 9   Number of Visits 18   Date for PT Re-Evaluation 02/02/15   PT Start Time 1230   PT Stop Time 1315   PT Time Calculation (min) 45 min   Equipment Utilized During Treatment Gait belt   Activity Tolerance Patient tolerated treatment well   Behavior During Therapy [facility] for tasks assessed/performed      Past Medical History  Diagnosis Date  . Hypertension   . Thyroid disease   . TMJ tenderness 01-11-13    right side-has issues from time to time.  . Pacemaker 12/15/06    3'08  . Dysrhythmia 01-11-13     hx. A. Fib-Pacemaker implanted left chest  . Hypothyroidism   . Anxiety 01-11-13    tx. Clonazepam.  . Edema extremities 01-11-13    retains fluid in legs-right greater than left.  . Neuromuscular disorder     neuropathy in feet  . GERD (gastroesophageal reflux disease)   . Arthritis     Osteoarthritis-knees, fingers  . Anemia   . CAD (coronary artery disease)   . Hyperlipemia   . Obstructive sleep apnea     on C Pap    Past Surgical History  Procedure Laterality Date  . Coronary artery bypass graft  2001    4 vessels-'00  . Tonsillectomy      child  . Appendectomy    . Insert / replace / remove pacemaker      '08-inserted left chest  . Cervical laminectomy    . Hernia repair    . Joint replacement      LTHA  . Total knee arthroplasty Right 01/17/2013    Procedure: RIGHT TOTAL KNEE ARTHROPLASTY;  Surgeon: Gearlean Alf, MD;  Location: [facility];  Service: Orthopedics;  Laterality: Right;  . Cardiac catheterization  03/28/08    patent grafts, nl EF  . Pacemaker insertion  12/15/06     medtronic  . US echocardiography  12/28/2007    LA mod. dilated,RA mildly dilated  . Nm myocar perf wall motion  02/12/2012    small fixed distal anteroapical/apical defect. No reversible ischemia    There were no vitals filed for this visit.  Visit Diagnosis:  Weakness generalized  Decreased coordination  Balance problems  Abnormality of gait  Impaired functional mobility and activity tolerance  Ankle weakness      Subjective Assessment - 01/09/15 1239    Subjective No new issues. He returned velcro shoes as did not feel they were as supportive.   Currently in Pain? No/denies                       [facility] Adult PT Treatment/Exercise - 01/09/15 1242    Transfers   Sit to Stand 5: Supervision;With upper extremity assist;With armrests;From chair/3-in-1   Stand to Sit 5: Supervision;With upper extremity assist;With armrests;To chair/3-in-1   Ambulation/Gait   Ambulation/Gait Yes   Ambulation/Gait Assistance 5: Supervision   Ambulation/Gait Assistance Details cues on posture, step length   Ambulation Distance (Feet) 500 Feet  500' X 1, 250' X 1   Assistive device Straight cane  Gait Pattern Step-through pattern;Decreased stride length;Trunk flexed   Ambulation Surface Level;Indoor   Ramp 5: Supervision  with cane   Curb 4: Min assist  with cane           PWR [facility]) - 01/10/15 0843    PWR! Up in parallel bars with mirror & verbal cues   PWR! Rock in parallel bars with mirror & verbal cues   PWR! Twist in parallel bars with mirror & verbal cues   PWR Step in parallel bars with mirror & verbal cues   Comments needs further instruction prior to issuing as HEP     Nustep level 5 X 59mn        PT Education - 01/09/15 1230    Education provided Yes   Education Details PT relaced his shoes & instructed in donning   Person(s) Educated Patient   Methods Explanation;Demonstration   Comprehension Verbalized understanding;Returned demonstration           PT Short Term Goals - 01/04/15 1601    PT SHORT TERM GOAL #1   Title demonstrates understanding of initial HEP (Target Date: 01/04/15)   Baseline met on 01/04/15   Status Achieved   PT SHORT TERM GOAL #2   Title verbalizes understanding of fall prevention strategies in home (Target Date: 01/04/15)   Baseline met on 01/04/15   Status Achieved   PT SHORT TERM GOAL #3   Title ambulates 200' with single point cane with cues for gait. (Target Date: 01/04/15)   Baseline met on 01/04/15   Status Achieved   PT SHORT TERM GOAL #4   Title negotiates ramp & curb with single point cane with minimal assist. (Target Date: 01/04/15)   Baseline met on 01/04/15   Status Achieved           PT Long Term Goals - 12/18/14 1634    PT LONG TERM GOAL #1   Title verbalizes / demonstrates understanding of fitness plan /ongoing HEP. (Target Date: 02/02/15)   Time 60   Period Days   Status On-going   PT LONG TERM GOAL #2   Title ambulates 300' with LRAD safely modified independent. (Target Date: 02/02/15)   Time 60   Period Days   Status On-going   PT LONG TERM GOAL #3   Title negotiates ramp, curb & stairs (1 rail) with LRAD modified independent. (Target Date: 02/02/15)   Time 60   Period Days   Status On-going   PT LONG TERM GOAL #4   Title Berg Balance >32/ 56 (Target Date: 02/02/15)   Time 60   Period Days   Status On-going   PT LONG TERM GOAL #5   Title Timed Up & Go with & without cane <13.5 seconds (Target Date: 02/02/15)   Time 60   Period Days   Status On-going               Plan - 01/09/15 1230    Clinical Impression Statement PWR! exercises in standing appear will benefit patient for strength & balance. Patient appears that relacing shoes was benefical to keeping heel seated and eased donning.   Pt will benefit from skilled therapeutic intervention in order to improve on the following deficits Abnormal gait;Decreased activity tolerance;Decreased balance;Decreased  endurance;Decreased knowledge of use of DME;Decreased mobility;Decreased range of motion;Decreased strength;Postural dysfunction   Rehab Potential Good   PT Frequency 2x / week   PT Duration Other (comment)  9 weeks   PT Treatment/Interventions ADLs/Self Care Home Management;DME  Instruction;Gait training;Stair training;Functional mobility training;Therapeutic activities;Therapeutic exercise;Balance training;Neuromuscular re-education;Patient/family education   PT Next Visit Plan continue toward LTG's, instruct in PWR! standing exercises   Consulted and Agree with Plan of Care Patient        Problem List Patient Active Problem List   Diagnosis Date Noted  . Pacemaker 07/02/2013  . CAD (coronary artery disease) of artery bypass graft 02/16/2013  . HTN (hypertension) 02/16/2013  . Hyperlipidemia 02/16/2013  . Postoperative anemia due to acute blood loss 01/20/2013  . Postop Hyponatremia 01/18/2013  . OA (osteoarthritis) of knee 01/17/2013  . Atrial fibrillation 12/21/2012  . Long term (current) use of anticoagulants 12/21/2012    Auburn Hert PT, DPT 01/10/2015, 8:50 AM  [facility] [address] [place], [name], [address] Phone: [phone]   Fax:  [phone]

## 2015-01-11 ENCOUNTER — Ambulatory Visit: Payer: Medicare Other | Admitting: Occupational Therapy

## 2015-01-11 ENCOUNTER — Ambulatory Visit: Payer: Medicare Other | Admitting: Physical Therapy

## 2015-01-11 ENCOUNTER — Encounter: Payer: Self-pay | Admitting: Physical Therapy

## 2015-01-11 DIAGNOSIS — R278 Other lack of coordination: Secondary | ICD-10-CM

## 2015-01-11 DIAGNOSIS — R2689 Other abnormalities of gait and mobility: Secondary | ICD-10-CM

## 2015-01-11 DIAGNOSIS — R279 Unspecified lack of coordination: Secondary | ICD-10-CM | POA: Diagnosis not present

## 2015-01-11 DIAGNOSIS — R531 Weakness: Secondary | ICD-10-CM

## 2015-01-11 DIAGNOSIS — Z7409 Other reduced mobility: Secondary | ICD-10-CM

## 2015-01-11 NOTE — Therapy (Signed)
[facility] [address] [place], [name], [address] Phone: [phone]   Fax:  [phone]  Occupational Therapy Treatment  Patient Details  Name: Mike Price MRN: [id number] Date of Birth: [birth year] Referring Provider:  [name], MD  Encounter Date: 01/11/2015      OT End of Session - 01/11/15 1326    Visit Number 10   Number of Visits 17   Date for OT Re-Evaluation 01/30/15   Authorization Type Medicare, UHC secondary   Authorization Time Period 60 days-01/30/15  G code next visit      Past Medical History  Diagnosis Date  . Hypertension   . Thyroid disease   . TMJ tenderness 01-11-13    right side-has issues from time to time.  . Pacemaker 12/15/06    3'08  . Dysrhythmia 01-11-13     hx. A. Fib-Pacemaker implanted left chest  . Hypothyroidism   . Anxiety 01-11-13    tx. Clonazepam.  . Edema extremities 01-11-13    retains fluid in legs-right greater than left.  . Neuromuscular disorder     neuropathy in feet  . GERD (gastroesophageal reflux disease)   . Arthritis     Osteoarthritis-knees, fingers  . Anemia   . CAD (coronary artery disease)   . Hyperlipemia   . Obstructive sleep apnea     on C Pap    Past Surgical History  Procedure Laterality Date  . Coronary artery bypass graft  2001    4 vessels-'00  . Tonsillectomy      child  . Appendectomy    . Insert / replace / remove pacemaker      '08-inserted left chest  . Cervical laminectomy    . Hernia repair    . Joint replacement      LTHA  . Total knee arthroplasty Right 01/17/2013    Procedure: RIGHT TOTAL KNEE ARTHROPLASTY;  Surgeon: Gearlean Alf, MD;  Location: [facility];  Service: Orthopedics;  Laterality: Right;  . Cardiac catheterization  03/28/08    patent grafts, nl EF  . Pacemaker insertion  12/15/06    medtronic  . US echocardiography  12/28/2007    LA mod. dilated,RA mildly dilated  . Nm myocar perf wall motion  02/12/2012   small fixed distal anteroapical/apical defect. No reversible ischemia    There were no vitals filed for this visit.  Visit Diagnosis:  No diagnosis found.      Subjective Assessment - 01/11/15 1326    Currently in Pain? Yes   Pain Score 2    Pain Orientation Left   Pain Type Acute pain   Pain Onset More than a month ago   Pain Frequency Intermittent   Aggravating Factors  walking   Pain Relieving Factors repositing   Multiple Pain Sites No        Treatment: Reviewed theraband HEP 10-20 reps, upgraded to red band for all exercises except for shoulder horizontal abduction (yllow continued). Dynamic standing balance with functional reaching to copy small peg design with emphasis on in hand manipulation, min v.c./ difficulty no LOB.                      OT Short Term Goals - 12/26/14 1233    OT SHORT TERM GOAL #1   Title I with HEP   Baseline check 01/04/15   Time 4   Period Weeks   Status Achieved   OT SHORT TERM GOAL #2  Title Pt will demonstrate ability to perform functional reaching in standing x 15 mins without LOB or rest breaks.   Time 4   Period Weeks   Status Achieved  With chair and/or countertop for close support prn   OT SHORT TERM GOAL #3   Title Pt will verbalize understanding of adapted strategies for ADLS/IADLS to increase pt safety, independence and minimize spills   Time 4   Period Weeks   Status Achieved           OT Long Term Goals - 01/04/15 1133    OT LONG TERM GOAL #1   Title Pt will demonstrate improved standing balance as evidenced by increasing standing functional reach by 2 inches bilaterally(from baseline.)   Baseline check 01/30/15   Time 8   Period Weeks   Status New   OT LONG TERM GOAL #2   Title Pt will demonstrate ability to perfom dynamic functional reaching activities in standing without LOB in prep for improved ADL/IADL performance.   Time 8   Period Weeks   Status New   OT LONG TERM GOAL #3   Title Pt  will demonstrate improved fine motor coordination as evidenced by decreasing bilateral 9 hole peg test scores to 35 secs or less without any drops.   Time 8   Period Weeks   Status New   OT LONG TERM GOAL #4   Title Pt will demonstrate ability to write a short paragraph without decrease in letter size and 100% legibility.   Baseline met 01/04/15   Time 8   Period Weeks   Status Achieved   OT LONG TERM GOAL #5   Title I with updated HEP.   Time 8   Period Weeks   Status New               Plan - 01/11/15 1343    Clinical Impression Statement Pt is progressing towards goals with improving strength.   Plan work towards long term goals   Consulted and Agree with Plan of Care Patient        Problem List Patient Active Problem List   Diagnosis Date Noted  . Pacemaker 07/02/2013  . CAD (coronary artery disease) of artery bypass graft 02/16/2013  . HTN (hypertension) 02/16/2013  . Hyperlipidemia 02/16/2013  . Postoperative anemia due to acute blood loss 01/20/2013  . Postop Hyponatremia 01/18/2013  . OA (osteoarthritis) of knee 01/17/2013  . Atrial fibrillation 12/21/2012  . Long term (current) use of anticoagulants 12/21/2012    [name] 01/11/2015, 1:46 PM Theone Murdoch, OTR/L [phone] Phone: [phone] 1:46 PM 01/11/2015 [facility] [address] [place], [name], [address] Phone: [phone]   Fax:  [phone]

## 2015-01-11 NOTE — Therapy (Signed)
[facility] [address] [place], [name], [address] Phone: [phone]   Fax:  [phone]  Physical Therapy Treatment  Patient Details  Name: Mike Price MRN: [id number] Date of Birth: 1935-02-25 Referring Provider:  [name], MD  Encounter Date: 01/11/2015      PT End of Session - 01/11/15 1412    Visit Number 10   Number of Visits 18   Date for PT Re-Evaluation 02/02/15   PT Start Time 1400   PT Stop Time 1446   PT Time Calculation (min) 46 min   Equipment Utilized During Treatment Gait belt   Activity Tolerance Patient tolerated treatment well   Behavior During Therapy [facility] for tasks assessed/performed      Past Medical History  Diagnosis Date  . Hypertension   . Thyroid disease   . TMJ tenderness 01-11-13    right side-has issues from time to time.  . Pacemaker 12/15/06    3'08  . Dysrhythmia 01-11-13     hx. A. Fib-Pacemaker implanted left chest  . Hypothyroidism   . Anxiety 01-11-13    tx. Clonazepam.  . Edema extremities 01-11-13    retains fluid in legs-right greater than left.  . Neuromuscular disorder     neuropathy in feet  . GERD (gastroesophageal reflux disease)   . Arthritis     Osteoarthritis-knees, fingers  . Anemia   . CAD (coronary artery disease)   . Hyperlipemia   . Obstructive sleep apnea     on C Pap    Past Surgical History  Procedure Laterality Date  . Coronary artery bypass graft  2001    4 vessels-'00  . Tonsillectomy      child  . Appendectomy    . Insert / replace / remove pacemaker      '08-inserted left chest  . Cervical laminectomy    . Hernia repair    . Joint replacement      LTHA  . Total knee arthroplasty Right 01/17/2013    Procedure: RIGHT TOTAL KNEE ARTHROPLASTY;  Surgeon: Gearlean Alf, MD;  Location: [facility];  Service: Orthopedics;  Laterality: Right;  . Cardiac catheterization  03/28/08    patent grafts, nl EF  . Pacemaker insertion  12/15/06     medtronic  . US echocardiography  12/28/2007    LA mod. dilated,RA mildly dilated  . Nm myocar perf wall motion  02/12/2012    small fixed distal anteroapical/apical defect. No reversible ischemia    There were no vitals filed for this visit.  Visit Diagnosis:  Balance problems  Weakness generalized  Decreased coordination      Subjective Assessment - 01/11/15 1410    Subjective No falls or new compaints except he reports a painful place on the bottom of left foot. Has been sore for a few days, no skin issued or wound noted. Located between the heel and the arch.   Currently in Pain? Yes   Pain Score 2   with weight bearing, none at rest   Pain Location Foot   Pain Orientation Left   Pain Descriptors / Indicators Aching;Sore   Pain Type Acute pain   Pain Onset In the past 7 days   Pain Frequency Rarely   Aggravating Factors  weight bearing   Pain Relieving Factors rest           [facility] Adult PT Treatment/Exercise - 01/11/15 1415    Berg Balance Test   Sit to Stand Able  to stand  independently using hands   Standing Unsupported Able to stand safely 2 minutes   Sitting with Back Unsupported but Feet Supported on Floor or Stool Able to sit safely and securely 2 minutes   Stand to Sit Sits safely with minimal use of hands   Transfers Able to transfer safely, minor use of hands   Standing Unsupported with Eyes Closed Able to stand 10 seconds with supervision   Standing Ubsupported with Feet Together Able to place feet together independently and stand 1 minute safely   From Standing, Reach Forward with Outstretched Arm Can reach forward >12 cm safely (5")  6 inches   From Standing Position, Pick up Object from Floor Able to pick up shoe, needs supervision   From Standing Position, Turn to Look Behind Over each Shoulder Looks behind from both sides and weight shifts well   Turn 360 Degrees Able to turn 360 degrees safely but slowly  5.50 to right, 4.31 toward left   Standing  Unsupported, Alternately Place Feet on Step/Stool Able to stand independently and safely and complete 8 steps in 20 seconds  16.22 sec's   Standing Unsupported, One Foot in Front Able to plae foot ahead of the other independently and hold 30 seconds   Standing on One Leg Tries to lift leg/unable to hold 3 seconds but remains standing independently   Total Score 46           PWR ([facility]) - 01/11/15 1430    PWR! Up next to mat with chair for balance if needed x 10 reps   PWR! Rock with single UE support on chair: alternating sides x 10 each side                             PWR! Twist alternating sides x 10 each side.   PWR Step alternating sides, keeping knee straight due to knee pain with bend/lunge motion.                  Comments cues on posture and correct exercise form.           PT Short Term Goals - 01/04/15 1601    PT SHORT TERM GOAL #1   Title demonstrates understanding of initial HEP (Target Date: 01/04/15)   Baseline met on 01/04/15   Status Achieved   PT SHORT TERM GOAL #2   Title verbalizes understanding of fall prevention strategies in home (Target Date: 01/04/15)   Baseline met on 01/04/15   Status Achieved   PT SHORT TERM GOAL #3   Title ambulates 200' with single point cane with cues for gait. (Target Date: 01/04/15)   Baseline met on 01/04/15   Status Achieved   PT SHORT TERM GOAL #4   Title negotiates ramp & curb with single point cane with minimal assist. (Target Date: 01/04/15)   Baseline met on 01/04/15   Status Achieved           PT Long Term Goals - 01/11/15 1617    PT LONG TERM GOAL #1   Title verbalizes / demonstrates understanding of fitness plan /ongoing HEP. (Target Date: 02/02/15)   Time 60   Period Days   Status On-going   PT LONG TERM GOAL #2   Title ambulates 300' with LRAD safely modified independent. (Target Date: 02/02/15)   Time 60   Period Days   Status On-going   PT LONG TERM GOAL #3  Title negotiates ramp, curb & stairs (1 rail)  with LRAD modified independent. (Target Date: 02/02/15)   Time 60   Period Days   Status On-going   PT LONG TERM GOAL #4   Title Berg Balance >32/ 56 (Target Date: 02/02/15)   Baseline 4/7- 46/56 score   Status Achieved   PT LONG TERM GOAL #5   Title Timed Up & Go with & without cane <13.5 seconds (Target Date: 02/02/15)   Time 60   Period Days   Status On-going           Plan - 01/11/15 1413    Clinical Impression Statement Pt with improved BERG balance score today vs on eval. Pt also more stable with PWR standing exercises, issued with HEP. Progressing toward goals.   Pt will benefit from skilled therapeutic intervention in order to improve on the following deficits Abnormal gait;Decreased activity tolerance;Decreased balance;Decreased endurance;Decreased knowledge of use of DME;Decreased mobility;Decreased range of motion;Decreased strength;Postural dysfunction   Rehab Potential Good   PT Frequency 2x / week   PT Duration Other (comment)  9 weeks   PT Treatment/Interventions ADLs/Self Care Home Management;DME Instruction;Gait training;Stair training;Functional mobility training;Therapeutic activities;Therapeutic exercise;Balance training;Neuromuscular re-education;Patient/family education   PT Next Visit Plan continue toward LTG's   Consulted and Agree with Plan of Care Patient        Problem List Patient Active Problem List   Diagnosis Date Noted  . Pacemaker 07/02/2013  . CAD (coronary artery disease) of artery bypass graft 02/16/2013  . HTN (hypertension) 02/16/2013  . Hyperlipidemia 02/16/2013  . Postoperative anemia due to acute blood loss 01/20/2013  . Postop Hyponatremia 01/18/2013  . OA (osteoarthritis) of knee 01/17/2013  . Atrial fibrillation 12/21/2012  . Long term (current) use of anticoagulants 12/21/2012    Willow Ora 01/11/2015, 4:18 PM  Willow Ora, PTA, [facility] Outpatient Neuro [facility] [address] [place], [place]  [address] [phone] 01/11/2015, 4:18 PM

## 2015-01-16 ENCOUNTER — Encounter: Payer: Self-pay | Admitting: Physical Therapy

## 2015-01-16 ENCOUNTER — Ambulatory Visit: Payer: Medicare Other | Admitting: Physical Therapy

## 2015-01-16 ENCOUNTER — Ambulatory Visit: Payer: Medicare Other | Admitting: Occupational Therapy

## 2015-01-16 DIAGNOSIS — R279 Unspecified lack of coordination: Principal | ICD-10-CM

## 2015-01-16 DIAGNOSIS — R29898 Other symptoms and signs involving the musculoskeletal system: Secondary | ICD-10-CM

## 2015-01-16 DIAGNOSIS — R278 Other lack of coordination: Secondary | ICD-10-CM

## 2015-01-16 DIAGNOSIS — R531 Weakness: Secondary | ICD-10-CM

## 2015-01-16 DIAGNOSIS — R2689 Other abnormalities of gait and mobility: Secondary | ICD-10-CM

## 2015-01-16 NOTE — Therapy (Signed)
[facility] [address] [place], [name], [address] Phone: [phone]   Fax:  [phone]  Physical Therapy Treatment  Patient Details  Name: Mike Price MRN: [id number] Date of Birth: 11-02-1934 Referring Provider:  [name], MD  Encounter Date: 01/16/2015      PT End of Session - 01/16/15 1410    Visit Number 11   Number of Visits 18   Date for PT Re-Evaluation 02/02/15   PT Start Time 6063   PT Stop Time 1445   PT Time Calculation (min) 42 min   Equipment Utilized During Treatment Gait belt   Activity Tolerance Patient tolerated treatment well   Behavior During Therapy [facility] for tasks assessed/performed      Past Medical History  Diagnosis Date  . Hypertension   . Thyroid disease   . TMJ tenderness 01-11-13    right side-has issues from time to time.  . Pacemaker 12/15/06    3'08  . Dysrhythmia 01-11-13     hx. A. Fib-Pacemaker implanted left chest  . Hypothyroidism   . Anxiety 01-11-13    tx. Clonazepam.  . Edema extremities 01-11-13    retains fluid in legs-right greater than left.  . Neuromuscular disorder     neuropathy in feet  . GERD (gastroesophageal reflux disease)   . Arthritis     Osteoarthritis-knees, fingers  . Anemia   . CAD (coronary artery disease)   . Hyperlipemia   . Obstructive sleep apnea     on C Pap    Past Surgical History  Procedure Laterality Date  . Coronary artery bypass graft  2001    4 vessels-'00  . Tonsillectomy      child  . Appendectomy    . Insert / replace / remove pacemaker      '08-inserted left chest  . Cervical laminectomy    . Hernia repair    . Joint replacement      LTHA  . Total knee arthroplasty Right 01/17/2013    Procedure: RIGHT TOTAL KNEE ARTHROPLASTY;  Surgeon: Gearlean Alf, MD;  Location: [facility];  Service: Orthopedics;  Laterality: Right;  . Cardiac catheterization  03/28/08    patent grafts, nl EF  . Pacemaker insertion  12/15/06   medtronic  . US echocardiography  12/28/2007    LA mod. dilated,RA mildly dilated  . Nm myocar perf wall motion  02/12/2012    small fixed distal anteroapical/apical defect. No reversible ischemia    There were no vitals filed for this visit.  Visit Diagnosis:  Weakness generalized  Balance problems  Decreased coordination  Ankle weakness      Subjective Assessment - 01/16/15 1408    Subjective No new compliants.  No falls. Reports increased left ankle pain today after being on feet all day.    Currently in Pain? Yes   Pain Score 4    Pain Location Ankle   Pain Orientation Left   Pain Descriptors / Indicators Aching;Tingling;Sore;Burning   Pain Type Chronic pain   Pain Onset More than a month ago   Pain Frequency Intermittent  "comes in flashes"   Aggravating Factors  standing   Pain Relieving Factors sitting          [facility] Adult PT Treatment/Exercise - 01/16/15 1425    Dynamic Standing Balance   Dynamic Standing - Balance Support No upper extremity supported;During functional activity   Dynamic Standing - Level of Assistance 4: Min assist   Dynamic Standing -  Balance Activities Alternating  foot traps   Dynamic Standing - Comments single leg stance activites with tall cones: alternating forward toe taps, cross toe taps, forward double toe taps, and cross double toe taps x 10 each with bil legs.                                        PWR [facility]) - 01/16/15 1412    PWR! Up next to mat with chair for balance as needed x 10 reps each/bil sides   PWR! Rock with single UE support on chair, alternating sides, x 10 each                                   PWR! Twist alternating sides x 10 each side   PWR Step alternating sides x 10 each    Comments cues on posture and ex form           PT Short Term Goals - 01/04/15 1601    PT SHORT TERM GOAL #1   Title demonstrates understanding of initial HEP (Target Date: 01/04/15)   Baseline met on 01/04/15   Status Achieved   PT  SHORT TERM GOAL #2   Title verbalizes understanding of fall prevention strategies in home (Target Date: 01/04/15)   Baseline met on 01/04/15   Status Achieved   PT SHORT TERM GOAL #3   Title ambulates 200' with single point cane with cues for gait. (Target Date: 01/04/15)   Baseline met on 01/04/15   Status Achieved   PT SHORT TERM GOAL #4   Title negotiates ramp & curb with single point cane with minimal assist. (Target Date: 01/04/15)   Baseline met on 01/04/15   Status Achieved           PT Long Term Goals - 01/11/15 1617    PT LONG TERM GOAL #1   Title verbalizes / demonstrates understanding of fitness plan /ongoing HEP. (Target Date: 02/02/15)   Time 60   Period Days   Status On-going   PT LONG TERM GOAL #2   Title ambulates 300' with LRAD safely modified independent. (Target Date: 02/02/15)   Time 60   Period Days   Status On-going   PT LONG TERM GOAL #3   Title negotiates ramp, curb & stairs (1 rail) with LRAD modified independent. (Target Date: 02/02/15)   Time 60   Period Days   Status On-going   PT LONG TERM GOAL #4   Title Berg Balance >32/ 56 (Target Date: 02/02/15)   Baseline 4/7- 46/56 score   Status Achieved   PT LONG TERM GOAL #5   Title Timed Up & Go with & without cane <13.5 seconds (Target Date: 02/02/15)   Time 60   Period Days   Status On-going           Plan - 01/16/15 1411    Clinical Impression Statement Pt with improved technique/form and balance with standing PWR exercises today. No complaints of increased pain with any of the balance acitivities as well. Progressing toward goals.   Pt will benefit from skilled therapeutic intervention in order to improve on the following deficits Abnormal gait;Decreased activity tolerance;Decreased balance;Decreased endurance;Decreased knowledge of use of DME;Decreased mobility;Decreased range of motion;Decreased strength;Postural dysfunction   Rehab Potential Good   PT Frequency 2x / week  PT Duration Other  (comment)  9 weeks   PT Treatment/Interventions ADLs/Self Care Home Management;DME Instruction;Gait training;Stair training;Functional mobility training;Therapeutic activities;Therapeutic exercise;Balance training;Neuromuscular re-education;Patient/family education   PT Next Visit Plan continue toward LTG's   Consulted and Agree with Plan of Care Patient      Problem List Patient Active Problem List   Diagnosis Date Noted  . Pacemaker 07/02/2013  . CAD (coronary artery disease) of artery bypass graft 02/16/2013  . HTN (hypertension) 02/16/2013  . Hyperlipidemia 02/16/2013  . Postoperative anemia due to acute blood loss 01/20/2013  . Postop Hyponatremia 01/18/2013  . OA (osteoarthritis) of knee 01/17/2013  . Atrial fibrillation 12/21/2012  . Long term (current) use of anticoagulants 12/21/2012    Willow Ora 01/16/2015, 4:21 PM  Willow Ora, PTA, [facility] [address], [place], [place] [address] [phone] 01/16/2015, 4:21 PM

## 2015-01-16 NOTE — Therapy (Signed)
[name], [name], [address] Phone: [phone]   Fax:  [phone]  Occupational Therapy Treatment  Patient Details  Name: Mike Price MRN: [id number] Date of Birth: Apr 25, 1935 Referring Provider:  [name], MD  Encounter Date: 01/16/2015      OT End of Session - 01/16/15 1342    Visit Number 11   Number of Visits 17   Date for OT Re-Evaluation 01/30/15   Authorization Type Medicare, [employer] secondary   Authorization Time Period 60 days-01/30/15  G code next visit   OT Start Time 1327   OT Stop Time 1400   OT Time Calculation (min) 33 min   Activity Tolerance Patient tolerated treatment well   Behavior During Therapy [facility] for tasks assessed/performed      Past Medical History  Diagnosis Date  . Hypertension   . Thyroid disease   . TMJ tenderness 01-11-13    right side-has issues from time to time.  . Pacemaker 12/15/06    3'08  . Dysrhythmia 01-11-13     hx. A. Fib-Pacemaker implanted left chest  . Hypothyroidism   . Anxiety 01-11-13    tx. Clonazepam.  . Edema extremities 01-11-13    retains fluid in legs-right greater than left.  . Neuromuscular disorder     neuropathy in feet  . GERD (gastroesophageal reflux disease)   . Arthritis     Osteoarthritis-knees, fingers  . Anemia   . CAD (coronary artery disease)   . Hyperlipemia   . Obstructive sleep apnea     on C Pap    Past Surgical History  Procedure Laterality Date  . Coronary artery bypass graft  2001    4 vessels-'00  . Tonsillectomy      child  . Appendectomy    . Insert / replace / remove pacemaker      '08-inserted left chest  . Cervical laminectomy    . Hernia repair    . Joint replacement      LTHA  . Total knee arthroplasty Right 01/17/2013    Procedure: RIGHT TOTAL KNEE ARTHROPLASTY;  Surgeon: Gearlean Alf, MD;  Location: [facility];  Service: Orthopedics;  Laterality: Right;  . Cardiac catheterization   03/28/08    patent grafts, nl EF  . Pacemaker insertion  12/15/06    medtronic  . US echocardiography  12/28/2007    LA mod. dilated,RA mildly dilated  . Nm myocar perf wall motion  02/12/2012    small fixed distal anteroapical/apical defect. No reversible ischemia    There were no vitals filed for this visit.  Visit Diagnosis:  Decreased coordination  Balance problems  Weakness generalized      Subjective Assessment - 01/16/15 1336    Subjective  Pt reports ankle pain   Currently in Pain? Yes   Pain Score 3    Pain Location Ankle   Pain Orientation Left   Pain Type Chronic pain   Pain Onset In the past 7 days   Aggravating Factors  standing   Pain Relieving Factors sitting   Multiple Pain Sites No            [facility] OT Assessment - 01/16/15 0001    Coordination   Right 9 Hole Peg Test 37.04 secs   Left 9 Hole Peg Test 42.60 secs         Treatment: Pt arrived late reporting increased fatigue and ankle pain after standing at homeless [facility] today. Arm  bike x 6 mins level 5 for conditioning. Functional reaching with right then left UE's to copy small peg design on vertical surface for increased fine motor coordination, min difficulty/ min v.c.  Pt reported bilateral UE fatigue following task, pt performed in seated due to ankle pain.                 OT Short Term Goals - 12/26/14 1233    OT SHORT TERM GOAL #1   Title I with HEP   Baseline check 01/04/15   Time 4   Period Weeks   Status Achieved   OT SHORT TERM GOAL #2   Title Pt will demonstrate ability to perform functional reaching in standing x 15 mins without LOB or rest breaks.   Time 4   Period Weeks   Status Achieved  With chair and/or countertop for close support prn   OT SHORT TERM GOAL #3   Title Pt will verbalize understanding of adapted strategies for ADLS/IADLS to increase pt safety, independence and minimize spills   Time 4   Period Weeks   Status Achieved           OT Long  Term Goals - 01/04/15 1133    OT LONG TERM GOAL #1   Title Pt will demonstrate improved standing balance as evidenced by increasing standing functional reach by 2 inches bilaterally(from baseline.)   Baseline check 01/30/15   Time 8   Period Weeks   Status New   OT LONG TERM GOAL #2   Title Pt will demonstrate ability to perfom dynamic functional reaching activities in standing without LOB in prep for improved ADL/IADL performance.   Time 8   Period Weeks   Status New   OT LONG TERM GOAL #3   Title Pt will demonstrate improved fine motor coordination as evidenced by decreasing bilateral 9 hole peg test scores to 35 secs or less without any drops.   Time 8   Period Weeks   Status New   OT LONG TERM GOAL #4   Title Pt will demonstrate ability to write a short paragraph without decrease in letter size and 100% legibility.   Baseline met 01/04/15   Time 8   Period Weeks   Status Achieved   OT LONG TERM GOAL #5   Title I with updated HEP.   Time 8   Period Weeks   Status New               Plan - 01/16/15 1358    Clinical Impression Statement Pt demonstrates improving standing tolerance and decreased LOB.    Plan continue to work towards [place] Pt has [place] HEP   Consulted and Agree with Plan of Care Patient        Problem List Patient Active Problem List   Diagnosis Date Noted  . Pacemaker 07/02/2013  . CAD (coronary artery disease) of artery bypass graft 02/16/2013  . HTN (hypertension) 02/16/2013  . Hyperlipidemia 02/16/2013  . Postoperative anemia due to acute blood loss 01/20/2013  . Postop Hyponatremia 01/18/2013  . OA (osteoarthritis) of knee 01/17/2013  . Atrial fibrillation 12/21/2012  . Long term (current) use of anticoagulants 12/21/2012    [name] 01/16/2015, 2:13 PM Theone Murdoch, OTR/L [phone] Phone: [phone] 2:13 PM 01/16/2015 [facility] [address] [place], [name], [address] Phone: [phone]   Fax:  [phone]

## 2015-01-18 ENCOUNTER — Ambulatory Visit: Payer: Medicare Other | Admitting: Physical Therapy

## 2015-01-18 ENCOUNTER — Ambulatory Visit: Payer: Medicare Other | Admitting: Occupational Therapy

## 2015-01-18 ENCOUNTER — Encounter: Payer: Self-pay | Admitting: Physical Therapy

## 2015-01-18 DIAGNOSIS — R531 Weakness: Secondary | ICD-10-CM

## 2015-01-18 DIAGNOSIS — R278 Other lack of coordination: Secondary | ICD-10-CM

## 2015-01-18 DIAGNOSIS — Z7409 Other reduced mobility: Secondary | ICD-10-CM

## 2015-01-18 DIAGNOSIS — R2689 Other abnormalities of gait and mobility: Secondary | ICD-10-CM

## 2015-01-18 DIAGNOSIS — R29898 Other symptoms and signs involving the musculoskeletal system: Secondary | ICD-10-CM

## 2015-01-18 DIAGNOSIS — R279 Unspecified lack of coordination: Secondary | ICD-10-CM | POA: Diagnosis not present

## 2015-01-18 DIAGNOSIS — R269 Unspecified abnormalities of gait and mobility: Secondary | ICD-10-CM

## 2015-01-18 NOTE — Therapy (Signed)
[facility] [address] [place], [name], [address] Phone: [phone]   Fax:  [phone]  Occupational Therapy Treatment  Patient Details  Name: Mike Price MRN: [id number] Date of Birth: 1935-01-25 Referring Provider:  [name], MD  Encounter Date: 01/18/2015      OT End of Session - 01/18/15 1342    Visit Number 12   Number of Visits 17   Date for OT Re-Evaluation 01/30/15   Authorization Type Medicare, U[employer] secondary   Authorization Time Period 60 days-01/30/15    OT Start Time 1328   OT Stop Time 1400   OT Time Calculation (min) 32 min   Activity Tolerance Patient tolerated treatment well   Behavior During Therapy [facility] for tasks assessed/performed      Past Medical History  Diagnosis Date  . Hypertension   . Thyroid disease   . TMJ tenderness 01-11-13    right side-has issues from time to time.  . Pacemaker 12/15/06    3'08  . Dysrhythmia 01-11-13     hx. A. Fib-Pacemaker implanted left chest  . Hypothyroidism   . Anxiety 01-11-13    tx. Clonazepam.  . Edema extremities 01-11-13    retains fluid in legs-right greater than left.  . Neuromuscular disorder     neuropathy in feet  . GERD (gastroesophageal reflux disease)   . Arthritis     Osteoarthritis-knees, fingers  . Anemia   . CAD (coronary artery disease)   . Hyperlipemia   . Obstructive sleep apnea     on C Pap    Past Surgical History  Procedure Laterality Date  . Coronary artery bypass graft  2001    4 vessels-'00  . Tonsillectomy      child  . Appendectomy    . Insert / replace / remove pacemaker      '08-inserted left chest  . Cervical laminectomy    . Hernia repair    . Joint replacement      LTHA  . Total knee arthroplasty Right 01/17/2013    Procedure: RIGHT TOTAL KNEE ARTHROPLASTY;  Surgeon: Gearlean Alf, MD;  Location: [facility];  Service: Orthopedics;  Laterality: Right;  . Cardiac catheterization  03/28/08    patent  grafts, nl EF  . Pacemaker insertion  12/15/06    medtronic  . US echocardiography  12/28/2007    LA mod. dilated,RA mildly dilated  . Nm myocar perf wall motion  02/12/2012    small fixed distal anteroapical/apical defect. No reversible ischemia    There were no vitals filed for this visit.  Visit Diagnosis:  Decreased coordination  Balance problems  Weakness generalized      Subjective Assessment - 01/18/15 1340    Currently in Pain? No/denies        Treatment: Pt arrived late reporting he is congested from allergies. Handwriting activities with emphasis on size and legibility. Pt does well with a standard pen with gel grip.  PWR! Exercises  In modified quaraped at table for increased weightbearing, 1-3, 10 reps each min v.c. and demonstration. Pt demonstrates decreased tremor after weightbearing exercises, with improved handwriting.                      OT Short Term Goals - 12/26/14 1233    OT SHORT TERM GOAL #1   Title I with HEP   Baseline check 01/04/15   Time 4   Period Weeks   Status Achieved  OT SHORT TERM GOAL #2   Title Pt will demonstrate ability to perform functional reaching in standing x 15 mins without LOB or rest breaks.   Time 4   Period Weeks   Status Achieved  With chair and/or countertop for close support prn   OT SHORT TERM GOAL #3   Title Pt will verbalize understanding of adapted strategies for ADLS/IADLS to increase pt safety, independence and minimize spills   Time 4   Period Weeks   Status Achieved           OT Long Term Goals - 01/04/15 1133    OT LONG TERM GOAL #1   Title Pt will demonstrate improved standing balance as evidenced by increasing standing functional reach by 2 inches bilaterally(from baseline.)   Baseline check 01/30/15   Time 8   Period Weeks   Status New   OT LONG TERM GOAL #2   Title Pt will demonstrate ability to perfom dynamic functional reaching activities in standing without LOB in prep for  improved ADL/IADL performance.   Time 8   Period Weeks   Status New   OT LONG TERM GOAL #3   Title Pt will demonstrate improved fine motor coordination as evidenced by decreasing bilateral 9 hole peg test scores to 35 secs or less without any drops.   Time 8   Period Weeks   Status New   OT LONG TERM GOAL #4   Title Pt will demonstrate ability to write a short paragraph without decrease in letter size and 100% legibility.   Baseline met 01/04/15   Time 8   Period Weeks   Status Achieved   OT LONG TERM GOAL #5   Title I with updated HEP.   Time 8   Period Weeks   Status New               Plan - 01/18/15 1357    Clinical Impression Statement Pt is progressing towards goals for standing balance.    Clinical Impairments Affecting Rehab Potential decreased coordination, tremor, decreased strength, decreased activity tolerance   Plan issued modified quadraped PWR!   Consulted and Agree with Plan of Care Patient        Problem List Patient Active Problem List   Diagnosis Date Noted  . Pacemaker 07/02/2013  . CAD (coronary artery disease) of artery bypass graft 02/16/2013  . HTN (hypertension) 02/16/2013  . Hyperlipidemia 02/16/2013  . Postoperative anemia due to acute blood loss 01/20/2013  . Postop Hyponatremia 01/18/2013  . OA (osteoarthritis) of knee 01/17/2013  . Atrial fibrillation 12/21/2012  . Long term (current) use of anticoagulants 12/21/2012    Keoshia Steinmetz 01/18/2015, 5:06 PM  [facility] [address] [place], [name], [address] Phone: [phone]   Fax:  [phone]

## 2015-01-19 ENCOUNTER — Ambulatory Visit
Admission: RE | Admit: 2015-01-19 | Discharge: 2015-01-19 | Disposition: A | Discharge status: Home / Self Care | Payer: Medicare Other | Source: Ambulatory Visit | Attending: Internal Medicine | Admitting: Internal Medicine

## 2015-01-19 ENCOUNTER — Other Ambulatory Visit: Payer: Self-pay | Admitting: Internal Medicine

## 2015-01-19 DIAGNOSIS — R059 Cough, unspecified: Secondary | ICD-10-CM

## 2015-01-19 DIAGNOSIS — R05 Cough: Secondary | ICD-10-CM

## 2015-01-19 NOTE — Therapy (Signed)
[facility] [address] [place], [name], [address] Phone: [phone]   Fax:  [phone]  Physical Therapy Treatment  Patient Details  Name: Mike Price MRN: [id number] Date of Birth: 1935/06/30 Referring Provider:  [name], MD  Encounter Date: 01/18/2015      PT End of Session - 01/18/15 1410    Visit Number 12   Number of Visits 18   Date for PT Re-Evaluation 02/02/15   PT Start Time 7948   PT Stop Time 1445   PT Time Calculation (min) 40 min   Equipment Utilized During Treatment Gait belt   Activity Tolerance Patient tolerated treatment well   Behavior During Therapy [facility] for tasks assessed/performed      Past Medical History  Diagnosis Date  . Hypertension   . Thyroid disease   . TMJ tenderness 01-11-13    right side-has issues from time to time.  . Pacemaker 12/15/06    3'08  . Dysrhythmia 01-11-13     hx. A. Fib-Pacemaker implanted left chest  . Hypothyroidism   . Anxiety 01-11-13    tx. Clonazepam.  . Edema extremities 01-11-13    retains fluid in legs-right greater than left.  . Neuromuscular disorder     neuropathy in feet  . GERD (gastroesophageal reflux disease)   . Arthritis     Osteoarthritis-knees, fingers  . Anemia   . CAD (coronary artery disease)   . Hyperlipemia   . Obstructive sleep apnea     on C Pap    Past Surgical History  Procedure Laterality Date  . Coronary artery bypass graft  2001    4 vessels-'00  . Tonsillectomy      child  . Appendectomy    . Insert / replace / remove pacemaker      '08-inserted left chest  . Cervical laminectomy    . Hernia repair    . Joint replacement      LTHA  . Total knee arthroplasty Right 01/17/2013    Procedure: RIGHT TOTAL KNEE ARTHROPLASTY;  Surgeon: Gearlean Alf, MD;  Location: [facility];  Service: Orthopedics;  Laterality: Right;  . Cardiac catheterization  03/28/08    patent grafts, nl EF  . Pacemaker insertion  12/15/06   medtronic  . US echocardiography  12/28/2007    LA mod. dilated,RA mildly dilated  . Nm myocar perf wall motion  02/12/2012    small fixed distal anteroapical/apical defect. No reversible ischemia    There were no vitals filed for this visit.  Visit Diagnosis:  Decreased coordination  Balance problems  Abnormality of gait  Ankle weakness  Weakness generalized  Impaired functional mobility and activity tolerance      Subjective Assessment - 01/18/15 1407    Subjective Alergies are "getting him" today. Reports he is short of breath and congested today. Ankle is better. No falls. No pain.   Currently in Pain? No/denies   Pain Score 0-No pain           [facility] Adult PT Treatment/Exercise - 01/18/15 1411    Transfers   Sit to Stand 5: Supervision;With upper extremity assist;With armrests;From chair/3-in-1   Stand to Sit 5: Supervision;With upper extremity assist;With armrests;To chair/3-in-1   Ambulation/Gait   Ambulation/Gait Yes   Ambulation/Gait Assistance 5: Supervision   Ambulation/Gait Assistance Details cues on posture, step length. pt with 2/4 dyspnea afterwards   Ambulation Distance (Feet) 440 Feet   Assistive device Straight cane   Gait Pattern  Step-through pattern;Decreased stride length;Trunk flexed   Ambulation Surface Level;Indoor   Dynamic Standing Balance   Dynamic Standing - Balance Support No upper extremity supported;During functional activity   Dynamic Standing - Level of Assistance 4: Min assist;3: Mod assist   Dynamic Standing - Balance Activities Rocker board;Head turns;Head nods;Eyes closed;Foam balance beam   Dynamic Standing - Comments both ways on balance board:eyes closed no head movements, eyes open with head nods/shakes; standing with feet across blue foam beam: alternating forward heel taps x 10 each and backward toe taps x 10 each leg; on air ex with feet apart: heel/toe raises x 10 reps and alternating slow marching x 10 each side.                            PT Short Term Goals - 01/04/15 1601    PT SHORT TERM GOAL #1   Title demonstrates understanding of initial HEP (Target Date: 01/04/15)   Baseline met on 01/04/15   Status Achieved   PT SHORT TERM GOAL #2   Title verbalizes understanding of fall prevention strategies in home (Target Date: 01/04/15)   Baseline met on 01/04/15   Status Achieved   PT SHORT TERM GOAL #3   Title ambulates 200' with single point cane with cues for gait. (Target Date: 01/04/15)   Baseline met on 01/04/15   Status Achieved   PT SHORT TERM GOAL #4   Title negotiates ramp & curb with single point cane with minimal assist. (Target Date: 01/04/15)   Baseline met on 01/04/15   Status Achieved           PT Long Term Goals - 01/11/15 1617    PT LONG TERM GOAL #1   Title verbalizes / demonstrates understanding of fitness plan /ongoing HEP. (Target Date: 02/02/15)   Time 60   Period Days   Status On-going   PT LONG TERM GOAL #2   Title ambulates 300' with LRAD safely modified independent. (Target Date: 02/02/15)   Time 60   Period Days   Status On-going   PT LONG TERM GOAL #3   Title negotiates ramp, curb & stairs (1 rail) with LRAD modified independent. (Target Date: 02/02/15)   Time 60   Period Days   Status On-going   PT LONG TERM GOAL #4   Title Berg Balance >32/ 56 (Target Date: 02/02/15)   Baseline 4/7- 46/56 score   Status Achieved   PT LONG TERM GOAL #5   Title Timed Up & Go with & without cane <13.5 seconds (Target Date: 02/02/15)   Time 60   Period Days   Status On-going           Plan - 01/18/15 1410    Clinical Impression Statement Pt challenged by balance activities today, however no complaints after session of increased pain. Progressing toward goals.   Pt will benefit from skilled therapeutic intervention in order to improve on the following deficits Abnormal gait;Decreased activity tolerance;Decreased balance;Decreased endurance;Decreased knowledge of use of DME;Decreased  mobility;Decreased range of motion;Decreased strength;Postural dysfunction   Rehab Potential Good   PT Frequency 2x / week   PT Duration Other (comment)  9 weeks   PT Treatment/Interventions ADLs/Self Care Home Management;DME Instruction;Gait training;Stair training;Functional mobility training;Therapeutic activities;Therapeutic exercise;Balance training;Neuromuscular re-education;Patient/family education   PT Next Visit Plan continue toward LTG's   Consulted and Agree with Plan of Care Patient      Problem List Patient Active Problem List  Diagnosis Date Noted  . Pacemaker 07/02/2013  . CAD (coronary artery disease) of artery bypass graft 02/16/2013  . HTN (hypertension) 02/16/2013  . Hyperlipidemia 02/16/2013  . Postoperative anemia due to acute blood loss 01/20/2013  . Postop Hyponatremia 01/18/2013  . OA (osteoarthritis) of knee 01/17/2013  . Atrial fibrillation 12/21/2012  . Long term (current) use of anticoagulants 12/21/2012    Willow Ora 01/19/2015, 3:27 PM  Willow Ora, PTA, [facility] [address] [place], [place] [address] [phone] 01/19/2015, 3:27 PM

## 2015-01-23 ENCOUNTER — Ambulatory Visit: Payer: Medicare Other | Admitting: Occupational Therapy

## 2015-01-23 ENCOUNTER — Ambulatory Visit: Payer: Medicare Other | Admitting: Rehabilitative and Restorative Service Providers"

## 2015-01-23 ENCOUNTER — Telehealth: Payer: Self-pay | Admitting: Occupational Therapy

## 2015-01-23 NOTE — Telephone Encounter (Signed)
Therapist spoke with pt. and he reports that he has been sick since last week and on antibiotics(trying to fight off pneumonia).  Pt reports he called to cancel his appointment yesterday. Pt reports that if he is not feeling better by Thurs., he will call to cancel his Friday appointment.

## 2015-01-24 ENCOUNTER — Ambulatory Visit (INDEPENDENT_AMBULATORY_CARE_PROVIDER_SITE_OTHER): Payer: Medicare Other | Admitting: Pharmacist Clinician (PhC)/ Clinical Pharmacy Specialist

## 2015-01-24 DIAGNOSIS — I4891 Unspecified atrial fibrillation: Secondary | ICD-10-CM | POA: Diagnosis not present

## 2015-01-24 DIAGNOSIS — Z7901 Long term (current) use of anticoagulants: Secondary | ICD-10-CM

## 2015-01-24 LAB — POCT INR: INR: 2.3

## 2015-01-26 ENCOUNTER — Ambulatory Visit: Payer: Medicare Other

## 2015-01-26 ENCOUNTER — Ambulatory Visit: Payer: Medicare Other | Admitting: Occupational Therapy

## 2015-01-26 VITALS — HR 86

## 2015-01-26 DIAGNOSIS — R2689 Other abnormalities of gait and mobility: Secondary | ICD-10-CM

## 2015-01-26 DIAGNOSIS — R279 Unspecified lack of coordination: Secondary | ICD-10-CM

## 2015-01-26 DIAGNOSIS — R531 Weakness: Secondary | ICD-10-CM

## 2015-01-26 DIAGNOSIS — R269 Unspecified abnormalities of gait and mobility: Secondary | ICD-10-CM

## 2015-01-26 DIAGNOSIS — R278 Other lack of coordination: Secondary | ICD-10-CM

## 2015-01-26 NOTE — Therapy (Signed)
[facility] [address] [place], [name], [address] Phone: [phone]   Fax:  [phone]  Occupational Therapy Treatment  Patient Details  Name: Mike Price MRN: [id number] Date of Birth: [birth year] Referring Provider:  [name], MD  Encounter Date: 01/26/2015      OT End of Session - 01/26/15 1202    Visit Number 13   Number of Visits 17   Date for OT Re-Evaluation 01/30/15   Authorization Type Medicare, UHC secondary   Authorization Time Period 60 days-01/30/15    OT Start Time 1148   OT Stop Time 1230   OT Time Calculation (min) 42 min   Activity Tolerance Patient tolerated treatment well   Behavior During Therapy [facility] for tasks assessed/performed      Past Medical History  Diagnosis Date  . Hypertension   . Thyroid disease   . TMJ tenderness 01-11-13    right side-has issues from time to time.  . Pacemaker 12/15/06    3'08  . Dysrhythmia 01-11-13     hx. A. Fib-Pacemaker implanted left chest  . Hypothyroidism   . Anxiety 01-11-13    tx. Clonazepam.  . Edema extremities 01-11-13    retains fluid in legs-right greater than left.  . Neuromuscular disorder     neuropathy in feet  . GERD (gastroesophageal reflux disease)   . Arthritis     Osteoarthritis-knees, fingers  . Anemia   . CAD (coronary artery disease)   . Hyperlipemia   . Obstructive sleep apnea     on C Pap    Past Surgical History  Procedure Laterality Date  . Coronary artery bypass graft  2001    4 vessels-'00  . Tonsillectomy      child  . Appendectomy    . Insert / replace / remove pacemaker      '08-inserted left chest  . Cervical laminectomy    . Hernia repair    . Joint replacement      LTHA  . Total knee arthroplasty Right 01/17/2013    Procedure: RIGHT TOTAL KNEE ARTHROPLASTY;  Surgeon: Gearlean Alf, MD;  Location: [facility];  Service: Orthopedics;  Laterality: Right;  . Cardiac catheterization  03/28/08    patent  grafts, nl EF  . Pacemaker insertion  12/15/06    medtronic  . US echocardiography  12/28/2007    LA mod. dilated,RA mildly dilated  . Nm myocar perf wall motion  02/12/2012    small fixed distal anteroapical/apical defect. No reversible ischemia    There were no vitals filed for this visit.  Visit Diagnosis:  Balance problems  Decreased coordination  Weakness generalized    Treatment: Dynamic standing balance with trunk rotation and functional reaching bilaterally, without LOB. Therapist started checking progress towards goals. PWR! exercises modified quadraped at table (exercises 1-3) 10 reps, min v.c./ demonstration. Handwriting task, Pt was able to write 4 sentences with good legibility and no significant decrease in letter size.                            OT Short Term Goals - 12/26/14 1233    OT SHORT TERM GOAL #1   Title I with HEP   Baseline check 01/04/15   Time 4   Period Weeks   Status Achieved   OT SHORT TERM GOAL #2   Title Pt will demonstrate ability to perform functional reaching in standing x 15 mins  without LOB or rest breaks.   Time 4   Period Weeks   Status Achieved  With chair and/or countertop for close support prn   OT SHORT TERM GOAL #3   Title Pt will verbalize understanding of adapted strategies for ADLS/IADLS to increase pt safety, independence and minimize spills   Time 4   Period Weeks   Status Achieved           OT Long Term Goals - 01/26/15 1215    OT LONG TERM GOAL #1   Title Pt will demonstrate improved standing balance as evidenced by increasing standing functional reach by 2 inches bilaterally(from baseline.)   Baseline check 01/30/15   Time 8   Period Weeks   Status On-going   OT LONG TERM GOAL #2   Title Pt will demonstrate ability to perfom dynamic functional reaching activities in standing without LOB in prep for improved ADL/IADL performance.   Time 8   Period Weeks   Status Achieved   OT LONG TERM  GOAL #3   Title Pt will demonstrate improved fine motor coordination as evidenced by decreasing bilateral 9 hole peg test scores to 35 secs or less without any drops.   Baseline met for RUE(32.13 secs) not for LUE (39.12)   Time 8   Period Weeks   Status Partially Met   OT LONG TERM GOAL #4   Title Pt will demonstrate ability to write a short paragraph without decrease in letter size and 100% legibility.   Baseline met 01/04/15   Time 8   Period Weeks   Status On-going   OT LONG TERM GOAL #5   Title I with updated HEP.   Baseline Upgraded theraband   Time 8   Period Weeks   Status Achieved               Plan - 01/26/15 1307    Clinical Impression Statement Pt is progressing towards goals, anticpipate d/c next week.   Plan issue modified quadraped PWR! chcek goals/ d/c   Consulted and Agree with Plan of Care Patient        Problem List Patient Active Problem List   Diagnosis Date Noted  . Pacemaker 07/02/2013  . CAD (coronary artery disease) of artery bypass graft 02/16/2013  . HTN (hypertension) 02/16/2013  . Hyperlipidemia 02/16/2013  . Postoperative anemia due to acute blood loss 01/20/2013  . Postop Hyponatremia 01/18/2013  . OA (osteoarthritis) of knee 01/17/2013  . Atrial fibrillation 12/21/2012  . Long term (current) use of anticoagulants 12/21/2012    RINE,KATHRYN 01/26/2015, 1:08 PM Theone Murdoch, OTR/L [phone] Phone: [phone] 1:08 PM 01/26/2015 [facility] [address] [place], [name], [address] Phone: [phone]   Fax:  [phone]

## 2015-01-26 NOTE — Therapy (Signed)
[facility] [address] [place], [name], [address] Phone: [phone]   Fax:  [phone]  Physical Therapy Treatment  Patient Details  Name: Mike Price MRN: [id number] Date of Birth: 10/01/1935 Referring Provider:  [name], MD  Encounter Date: 01/26/2015      PT End of Session - 01/26/15 1202    Visit Number 13   Number of Visits 18   Date for PT Re-Evaluation 02/02/15   Authorization Type G-code every 10th visit.   PT Start Time 1108   PT Stop Time 1147   PT Time Calculation (min) 39 min   Equipment Utilized During Treatment Gait belt   Activity Tolerance Patient limited by fatigue   Behavior During Therapy WFL for tasks assessed/performed      Past Medical History  Diagnosis Date  . Hypertension   . Thyroid disease   . TMJ tenderness 01-11-13    right side-has issues from time to time.  . Pacemaker 12/15/06    3'08  . Dysrhythmia 01-11-13     hx. A. Fib-Pacemaker implanted left chest  . Hypothyroidism   . Anxiety 01-11-13    tx. Clonazepam.  . Edema extremities 01-11-13    retains fluid in legs-right greater than left.  . Neuromuscular disorder     neuropathy in feet  . GERD (gastroesophageal reflux disease)   . Arthritis     Osteoarthritis-knees, fingers  . Anemia   . CAD (coronary artery disease)   . Hyperlipemia   . Obstructive sleep apnea     on C Pap    Past Surgical History  Procedure Laterality Date  . Coronary artery bypass graft  2001    4 vessels-'00  . Tonsillectomy      child  . Appendectomy    . Insert / replace / remove pacemaker      '08-inserted left chest  . Cervical laminectomy    . Hernia repair    . Joint replacement      LTHA  . Total knee arthroplasty Right 01/17/2013    Procedure: RIGHT TOTAL KNEE ARTHROPLASTY;  Surgeon: Gearlean Alf, MD;  Location: [facility];  Service: Orthopedics;  Laterality: Right;  . Cardiac catheterization  03/28/08    patent grafts, nl  EF  . Pacemaker insertion  12/15/06    medtronic  . US echocardiography  12/28/2007    LA mod. dilated,RA mildly dilated  . Nm myocar perf wall motion  02/12/2012    small fixed distal anteroapical/apical defect. No reversible ischemia    Filed Vitals:   01/26/15 1114 01/26/15 1120 01/26/15 1126 01/26/15 1200  Pulse: 78 90 91 86  SpO2: 91% 93% 97% 93%    Visit Diagnosis:  Abnormality of gait  Balance problems  Weakness generalized      Subjective Assessment - 01/26/15 1113    Subjective Pt arrived 8 minutes late to PT session. Pt denied falls since last visit. He is feeling a little better today, as he's recovering from PNA. He has been on augmentin for about a week and is taking his last dose today. Pt's MD also prescribed a coughing medication and Symbicort for PNA.   Patient Stated Goals To walk better and more stable   Currently in Pain? No/denies                         [facility] Adult PT Treatment/Exercise - 01/26/15 1115    Ambulation/Gait   Ambulation/Gait  Yes   Ambulation/Gait Assistance 5: Supervision   Ambulation/Gait Assistance Details Pt ambulated while performing head turns, with noted decreased speed while performing head turns. Cues to improve stride length and to look straight ahead.  Pt required seated rest breaks after each bout of amb. due to fatigue and SOB. One LOB episode, which pt self corrected with stepping strategy and UE support on SPC. See vitals section for SaO2 and HR details.   Ambulation Distance (Feet) --  75', 230', 345'   Assistive device Straight cane   Gait Pattern Step-through pattern;Decreased stride length;Trunk flexed   Ambulation Surface Level;Indoor   Stairs Yes   Stairs Assistance 5: Supervision;4: Min guard   Stairs Assistance Details (indicate cue type and reason) Supervision to ascend and min guard to descend stairs.    Stair Management Technique One rail Right;Alternating pattern;Step to pattern;Forwards   Number of  Stairs 4   Height of Stairs 6   Ramp 5: Supervision   Ramp Details (indicate cue type and reason) Cues on weight shifting and to improve step length.   Dynamic Standing Balance   Dynamic Standing - Balance Support No upper extremity supported   Dynamic Standing - Level of Assistance 4: Min assist;3: Mod assist   Dynamic Standing - Balance Activities Alternating  foot traps;Other (comment)   Dynamic Standing - Comments Performed on red mat with B LEs: single cone taps and tip cone down and upright, 2x4 cones/activity/LE. Pt progressed from mod A to min A during R SLS activities. Pt require seated rest break after balance activites due to fatigue.                PT Education - 01/26/15 1201    Education provided Yes   Education Details Pursed lip breathing to improve SaO2 on room air and to decrease SOB.   Person(s) Educated Patient   Methods Explanation;Demonstration;Verbal cues   Comprehension Verbalized understanding;Returned demonstration          PT Short Term Goals - 01/04/15 1601    PT SHORT TERM GOAL #1   Title demonstrates understanding of initial HEP (Target Date: 01/04/15)   Baseline met on 01/04/15   Status Achieved   PT SHORT TERM GOAL #2   Title verbalizes understanding of fall prevention strategies in home (Target Date: 01/04/15)   Baseline met on 01/04/15   Status Achieved   PT SHORT TERM GOAL #3   Title ambulates 200' with single point cane with cues for gait. (Target Date: 01/04/15)   Baseline met on 01/04/15   Status Achieved   PT SHORT TERM GOAL #4   Title negotiates ramp & curb with single point cane with minimal assist. (Target Date: 01/04/15)   Baseline met on 01/04/15   Status Achieved           PT Long Term Goals - 01/11/15 1617    PT LONG TERM GOAL #1   Title verbalizes / demonstrates understanding of fitness plan /ongoing HEP. (Target Date: 02/02/15)   Time 60   Period Days   Status On-going   PT LONG TERM GOAL #2   Title ambulates 300'  with LRAD safely modified independent. (Target Date: 02/02/15)   Time 60   Period Days   Status On-going   PT LONG TERM GOAL #3   Title negotiates ramp, curb & stairs (1 rail) with LRAD modified independent. (Target Date: 02/02/15)   Time 60   Period Days   Status On-going   PT LONG TERM  GOAL #4   Title Berg Balance >32/ 56 (Target Date: 02/02/15)   Baseline 4/7- 46/56 score   Status Achieved   PT LONG TERM GOAL #5   Title Timed Up & Go with & without cane <13.5 seconds (Target Date: 02/02/15)   Time 60   Period Days   Status On-going               Plan - 01/26/15 1203    Clinical Impression Statement Pt limited today due to fatigue and SOB, as he reported he is getting over PNA. Pt exhibited impaired balance especially during R SLS activities due to weakness and decreased lateral weight shifting. Continue with POC.   Pt will benefit from skilled therapeutic intervention in order to improve on the following deficits Abnormal gait;Decreased activity tolerance;Decreased balance;Decreased endurance;Decreased knowledge of use of DME;Decreased mobility;Decreased range of motion;Decreased strength;Postural dysfunction   Rehab Potential Good   PT Frequency 2x / week   PT Duration Other (comment)  9 weeks   PT Treatment/Interventions ADLs/Self Care Home Management;DME Instruction;Gait training;Stair training;Functional mobility training;Therapeutic activities;Therapeutic exercise;Balance training;Neuromuscular re-education;Patient/family education   PT Next Visit Plan Begin to assess LTGs and schedule additional PT visits as needed.   PT Home Exercise Plan ROM & strength of ankles & hips, balance   Consulted and Agree with Plan of Care Patient        Problem List Patient Active Problem List   Diagnosis Date Noted  . Pacemaker 07/02/2013  . CAD (coronary artery disease) of artery bypass graft 02/16/2013  . HTN (hypertension) 02/16/2013  . Hyperlipidemia 02/16/2013  .  Postoperative anemia due to acute blood loss 01/20/2013  . Postop Hyponatremia 01/18/2013  . OA (osteoarthritis) of knee 01/17/2013  . Atrial fibrillation 12/21/2012  . Long term (current) use of anticoagulants 12/21/2012    Jadalee Westcott L 01/26/2015, 12:06 PM  [facility] [address] [place], [name], [address] Phone: [phone]   Fax:  [phone]     Geoffry Paradise, PT,DPT 01/26/2015 12:06 PM Phone: [phone] Fax: [phone]

## 2015-01-30 ENCOUNTER — Ambulatory Visit: Payer: Medicare Other

## 2015-01-30 ENCOUNTER — Ambulatory Visit: Payer: Medicare Other | Admitting: Occupational Therapy

## 2015-01-30 DIAGNOSIS — Z7409 Other reduced mobility: Secondary | ICD-10-CM

## 2015-01-30 DIAGNOSIS — R2689 Other abnormalities of gait and mobility: Secondary | ICD-10-CM

## 2015-01-30 DIAGNOSIS — R279 Unspecified lack of coordination: Secondary | ICD-10-CM | POA: Diagnosis not present

## 2015-01-30 DIAGNOSIS — R278 Other lack of coordination: Secondary | ICD-10-CM

## 2015-01-30 DIAGNOSIS — R531 Weakness: Secondary | ICD-10-CM

## 2015-01-30 DIAGNOSIS — R269 Unspecified abnormalities of gait and mobility: Secondary | ICD-10-CM

## 2015-01-30 NOTE — Therapy (Signed)
[facility] [address] [place], [name], [address] Phone: [phone]   Fax:  [phone]  Occupational Therapy Treatment  Patient Details  Name: BARACK NICODEMUS MRN: [id number] Date of Birth: 12-01-1934 Referring Provider:  [name], MD  Encounter Date: 01/30/2015      OT End of Session - 01/30/15 1220    Visit Number 14   Number of Visits 17   Date for OT Re-Evaluation 01/30/15   Authorization Type Medicare, [employer] secondary   Authorization Time Period 60 days-01/30/15    OT Start Time 1148   OT Stop Time 1230   OT Time Calculation (min) 42 min   Activity Tolerance Patient tolerated treatment well      Past Medical History  Diagnosis Date  . Hypertension   . Thyroid disease   . TMJ tenderness 01-11-13    right side-has issues from time to time.  . Pacemaker 12/15/06    3'08  . Dysrhythmia 01-11-13     hx. A. Fib-Pacemaker implanted left chest  . Hypothyroidism   . Anxiety 01-11-13    tx. Clonazepam.  . Edema extremities 01-11-13    retains fluid in legs-right greater than left.  . Neuromuscular disorder     neuropathy in feet  . GERD (gastroesophageal reflux disease)   . Arthritis     Osteoarthritis-knees, fingers  . Anemia   . CAD (coronary artery disease)   . Hyperlipemia   . Obstructive sleep apnea     on C Pap    Past Surgical History  Procedure Laterality Date  . Coronary artery bypass graft  2001    4 vessels-'00  . Tonsillectomy      child  . Appendectomy    . Insert / replace / remove pacemaker      '08-inserted left chest  . Cervical laminectomy    . Hernia repair    . Joint replacement      LTHA  . Total knee arthroplasty Right 01/17/2013    Procedure: RIGHT TOTAL KNEE ARTHROPLASTY;  Surgeon: Gearlean Alf, MD;  Location: [facility];  Service: Orthopedics;  Laterality: Right;  . Cardiac catheterization  03/28/08    patent grafts, nl EF  . Pacemaker insertion  12/15/06    medtronic  .  US echocardiography  12/28/2007    LA mod. dilated,RA mildly dilated  . Nm myocar perf wall motion  02/12/2012    small fixed distal anteroapical/apical defect. No reversible ischemia    There were no vitals filed for this visit.  Visit Diagnosis:  Decreased coordination  Weakness generalized  Impaired functional mobility and activity tolerance  Treatment: Started checking progress towards goals. Pt was instructed in modified quadraped PWR! Exercises 1-3, pt returned demonstration with min v.c. Handwriting activities, with pt demonstrating improved legibilty and letter size following exercises. Pt agrees with d/c/ next visit. Therapist cued pt to continue theraband HEP with red/ yellow band as green is too resistive.                          OT Education - 01/30/15 1350    Education provided Yes   Education Details PWR! modified quadraped   Person(s) Educated Patient   Methods Explanation;Demonstration;Verbal cues;Handout   Comprehension Verbalized understanding;Returned demonstration;Verbal cues required  can benefit from reinforcement          OT Short Term Goals - 12/26/14 1233    OT SHORT TERM GOAL #1  Title I with HEP   Baseline check 01/04/15   Time 4   Period Weeks   Status Achieved   OT SHORT TERM GOAL #2   Title Pt will demonstrate ability to perform functional reaching in standing x 15 mins without LOB or rest breaks.   Time 4   Period Weeks   Status Achieved  With chair and/or countertop for close support prn   OT SHORT TERM GOAL #3   Title Pt will verbalize understanding of adapted strategies for ADLS/IADLS to increase pt safety, independence and minimize spills   Time 4   Period Weeks   Status Achieved           OT Long Term Goals - 01/30/15 1157    OT LONG TERM GOAL #1   Title Pt will demonstrate improved standing balance as evidenced by increasing standing functional reach by 2 inches bilaterally(from baseline.)   Baseline  check 01/30/15, 9 inches bilaterally, met for RUE, not LUE   Time 8   Period Weeks   Status Partially Met   OT LONG TERM GOAL #2   Title Pt will demonstrate ability to perfom dynamic functional reaching activities in standing without LOB in prep for improved ADL/IADL performance.   Time 8   Period Weeks   Status Achieved   OT LONG TERM GOAL #3   Title Pt will demonstrate improved fine motor coordination as evidenced by decreasing bilateral 9 hole peg test scores to 35 secs or less without any drops.   Baseline met for RUE(32.13 secs) not for LUE (39.12)   Time 8   Period Weeks   Status Partially Met   OT LONG TERM GOAL #4   Title Pt will demonstrate ability to write a short paragraph without decrease in letter size and 100% legibility.   Baseline met 01/04/15   Time 8   Period Weeks   Status (p) Achieved   OT LONG TERM GOAL #5   Title I with updated HEP.   Baseline Upgraded theraband, PWR! modified quadraped   Time 8   Period Weeks   Status On-going               Plan - 01/30/15 1348    Clinical Impression Statement Pt is progressing towards goals. Pt can benefit from 1 additional viisit to reinforce modified quadraped PWR! exercises, issued today.   Plan review modified quadraped PWR! exercises, d/c g-code.   OT Home Exercise Plan Pt has theraband HEP, modified quadraped PWR!   Consulted and Agree with Plan of Care Patient        Problem List Patient Active Problem List   Diagnosis Date Noted  . Pacemaker 07/02/2013  . CAD (coronary artery disease) of artery bypass graft 02/16/2013  . HTN (hypertension) 02/16/2013  . Hyperlipidemia 02/16/2013  . Postoperative anemia due to acute blood loss 01/20/2013  . Postop Hyponatremia 01/18/2013  . OA (osteoarthritis) of knee 01/17/2013  . Atrial fibrillation 12/21/2012  . Long term (current) use of anticoagulants 12/21/2012  Theone Murdoch, OTR/L [phone] Phone: [phone] 1:52 PM  01/30/2015  RINE,KATHRYN 01/30/2015, 1:52 PM [facility] [address] [place], [name], [address] Phone: [phone]   Fax:  [phone]

## 2015-01-30 NOTE — Therapy (Signed)
[facility] [address] [place], [name], [address] Phone: [phone]   Fax:  [phone]  Physical Therapy Treatment  Patient Details  Name: Mike Price MRN: [id number] Date of Birth: Mar 25, 1935 Referring Provider:  [name], MD  Encounter Date: 01/30/2015      PT End of Session - 01/30/15 1212    Visit Number 14   Number of Visits 18   Date for PT Re-Evaluation 02/02/15   Authorization Type G-code every 10th visit.   PT Start Time 1107   PT Stop Time 1147   PT Time Calculation (min) 40 min      Past Medical History  Diagnosis Date  . Hypertension   . Thyroid disease   . TMJ tenderness 01-11-13    right side-has issues from time to time.  . Pacemaker 12/15/06    3'08  . Dysrhythmia 01-11-13     hx. A. Fib-Pacemaker implanted left chest  . Hypothyroidism   . Anxiety 01-11-13    tx. Clonazepam.  . Edema extremities 01-11-13    retains fluid in legs-right greater than left.  . Neuromuscular disorder     neuropathy in feet  . GERD (gastroesophageal reflux disease)   . Arthritis     Osteoarthritis-knees, fingers  . Anemia   . CAD (coronary artery disease)   . Hyperlipemia   . Obstructive sleep apnea     on C Pap    Past Surgical History  Procedure Laterality Date  . Coronary artery bypass graft  2001    4 vessels-'00  . Tonsillectomy      child  . Appendectomy    . Insert / replace / remove pacemaker      '08-inserted left chest  . Cervical laminectomy    . Hernia repair    . Joint replacement      LTHA  . Total knee arthroplasty Right 01/17/2013    Procedure: RIGHT TOTAL KNEE ARTHROPLASTY;  Surgeon: Gearlean Alf, MD;  Location: [facility];  Service: Orthopedics;  Laterality: Right;  . Cardiac catheterization  03/28/08    patent grafts, nl EF  . Pacemaker insertion  12/15/06    medtronic  . US echocardiography  12/28/2007    LA mod. dilated,RA mildly dilated  . Nm myocar perf wall motion   02/12/2012    small fixed distal anteroapical/apical defect. No reversible ischemia    There were no vitals filed for this visit.  Visit Diagnosis:  Abnormality of gait  Balance problems  Decreased coordination  Impaired functional mobility and activity tolerance      Subjective Assessment - 01/30/15 1110    Subjective Pt arrived 7 minutes late to PT session. Pt denies falls and states that he is feeling better but still has congestion.   Currently in Pain? No/denies    Neuro re-ed: Checked goals: Berg Balance test, TUG info below   Gait training: -Gait training x 315' with single point cane and MOD I with mild shortness of breath during final 10' which resolved with brief 1-2 minute standing rest break.  -Stair training: up/down 4 stairs with single rail and single point cane x4 trials. Pt required verbal cueing for sequencing of cane on stairs for safe negotiation.  -Curb training with MIN A initially to ascend curb, and supervision to descend curb. Performed 3x with cane. Verbal cues to lead with cane.  -Ramp training x3 up/down with supervision. Pt occasionally demonstrates unsteadiness on ramp, especially when descending.  Self  Care: Discussed possible renewal/continuation of therapy to maximize progress with balance. Therapist explained that this would be discussed with his other treating therapist/PTA. Pt is agreeable to renewal if it is recommended. Also discussed pt's plans to exercise with Silver Sneakers following D/C from PT.                 [facility] Adult PT Treatment/Exercise - 01/30/15 0001    Standardized Balance Assessment   Standardized Balance Assessment Timed Up and Go Test   Berg Balance Test   Sit to Stand Able to stand without using hands and stabilize independently   Standing Unsupported Able to stand safely 2 minutes   Sitting with Back Unsupported but Feet Supported on Floor or Stool Able to sit safely and securely 2 minutes   Stand to Sit  Sits safely with minimal use of hands   Transfers Able to transfer safely, minor use of hands   Standing Unsupported with Eyes Closed Able to stand 10 seconds with supervision   Standing Ubsupported with Feet Together Able to place feet together independently and stand for 1 minute with supervision   From Standing, Reach Forward with Outstretched Arm Can reach forward >12 cm safely (5")   From Standing Position, Pick up Object from Council to pick up shoe, needs supervision   From Standing Position, Turn to Look Behind Over each Shoulder Looks behind from both sides and weight shifts well   Turn 360 Degrees Able to turn 360 degrees safely but slowly   Standing Unsupported, Alternately Place Feet on Step/Stool Able to complete >2 steps/needs minimal assist   Standing Unsupported, One Foot in Front Able to plae foot ahead of the other independently and hold 30 seconds   Standing on One Leg Tries to lift leg/unable to hold 3 seconds but remains standing independently   Total Score 43   Timed Up and Go Test   TUG Normal TUG   Normal TUG (seconds) 13.84  average of 2 trials with single point cane                  PT Short Term Goals - 01/04/15 1601    PT SHORT TERM GOAL #1   Title demonstrates understanding of initial HEP (Target Date: 01/04/15)   Baseline met on 01/04/15   Status Achieved   PT SHORT TERM GOAL #2   Title verbalizes understanding of fall prevention strategies in home (Target Date: 01/04/15)   Baseline met on 01/04/15   Status Achieved   PT SHORT TERM GOAL #3   Title ambulates 200' with single point cane with cues for gait. (Target Date: 01/04/15)   Baseline met on 01/04/15   Status Achieved   PT SHORT TERM GOAL #4   Title negotiates ramp & curb with single point cane with minimal assist. (Target Date: 01/04/15)   Baseline met on 01/04/15   Status Achieved           PT Long Term Goals - 01/30/15 1126    PT LONG TERM GOAL #1   Title verbalizes / demonstrates  understanding of fitness plan /ongoing HEP. (Target Date: 02/02/15)   Time 60   Period Days   Status Achieved  Plans to use silver sneakers and work out at [employer] family [employer]   PT [place] #2   Title ambulates 300' with LRAD safely modified independent. (Target Date: 02/02/15)   Time 60   Period Days   Status Achieved   PT LONG TERM  GOAL #3   Title negotiates ramp, curb & stairs (1 rail) with LRAD modified independent. (Target Date: 02/02/15)   Time 60   Period Days   Status On-going  Supervision on 01/30/15   PT LONG TERM GOAL #4   Title Berg Balance >32/ 56 (Target Date: 02/02/15)   Baseline 4/7- 46/56 score   Status Achieved  43/56   PT LONG TERM GOAL #5   Title Timed Up & Go with & without cane <13.5 seconds (Target Date: 02/02/15)   Time 60   Period Days   Status On-going               Plan - 01/30/15 1212    Clinical Impression Statement Pt with minimal shortness of breath today, only present after 315' of ambulation. Pt continues to require verbal cues for proper sequencing of cane on stairs for safety. In addition, his Berg Balance Test score decreased by 3 points since testing earlier this month. Pt has not yet met all therapy goals, but overall is making progress and likely had a set back with respiratory illness. Recommend renewal x4 weeks (8 visits) to maximize balance progress, safety and functional independence.   PT Next Visit Plan Finish assessing LTGs, recommend renewal x4 weeks (or 8 visits)   Consulted and Agree with Plan of Care Patient        Problem List Patient Active Problem List   Diagnosis Date Noted  . Pacemaker 07/02/2013  . CAD (coronary artery disease) of artery bypass graft 02/16/2013  . HTN (hypertension) 02/16/2013  . Hyperlipidemia 02/16/2013  . Postoperative anemia due to acute blood loss 01/20/2013  . Postop Hyponatremia 01/18/2013  . OA (osteoarthritis) of knee 01/17/2013  . Atrial fibrillation 12/21/2012  . Long term  (current) use of anticoagulants 12/21/2012    Delrae Sawyers, PT,DPT,NCS 01/30/2015 12:34 PM Phone [phone] FAX [phone]           [facility] [address] [place], [name], [address] Phone: [phone]   Fax:  [phone]

## 2015-02-02 ENCOUNTER — Ambulatory Visit: Payer: Medicare Other | Admitting: Occupational Therapy

## 2015-02-02 ENCOUNTER — Ambulatory Visit: Payer: Medicare Other

## 2015-02-02 DIAGNOSIS — R269 Unspecified abnormalities of gait and mobility: Secondary | ICD-10-CM

## 2015-02-02 DIAGNOSIS — R279 Unspecified lack of coordination: Secondary | ICD-10-CM | POA: Diagnosis not present

## 2015-02-02 DIAGNOSIS — Z7409 Other reduced mobility: Secondary | ICD-10-CM

## 2015-02-02 DIAGNOSIS — R531 Weakness: Secondary | ICD-10-CM

## 2015-02-02 DIAGNOSIS — R278 Other lack of coordination: Secondary | ICD-10-CM

## 2015-02-02 NOTE — Therapy (Signed)
[facility] [address] [place], [name], [address] Phone: [phone]   Fax:  [phone]  Occupational Therapy Treatment  Patient Details  Name: Mike Price MRN: [id number] Date of Birth: January 10, 1935 Referring Provider:  [name], MD  Encounter Date: 02/02/2015      OT End of Session - 02/02/15 1228    Visit Number 15   Number of Visits 15   Date for OT Re-Evaluation 02/02/15   Authorization Type Medicare, U[employer] secondary   Authorization Time Period 60 days-01/30/15    OT Start Time 1150   OT Stop Time 1225   OT Time Calculation (min) 35 min   Activity Tolerance Patient tolerated treatment well   Behavior During Therapy [facility] for tasks assessed/performed      Past Medical History  Diagnosis Date  . Hypertension   . Thyroid disease   . TMJ tenderness 01-11-13    right side-has issues from time to time.  . Pacemaker 12/15/06    3'08  . Dysrhythmia 01-11-13     hx. A. Fib-Pacemaker implanted left chest  . Hypothyroidism   . Anxiety 01-11-13    tx. Clonazepam.  . Edema extremities 01-11-13    retains fluid in legs-right greater than left.  . Neuromuscular disorder     neuropathy in feet  . GERD (gastroesophageal reflux disease)   . Arthritis     Osteoarthritis-knees, fingers  . Anemia   . CAD (coronary artery disease)   . Hyperlipemia   . Obstructive sleep apnea     on C Pap    Past Surgical History  Procedure Laterality Date  . Coronary artery bypass graft  2001    4 vessels-'00  . Tonsillectomy      child  . Appendectomy    . Insert / replace / remove pacemaker      '08-inserted left chest  . Cervical laminectomy    . Hernia repair    . Joint replacement      LTHA  . Total knee arthroplasty Right 01/17/2013    Procedure: RIGHT TOTAL KNEE ARTHROPLASTY;  Surgeon: Gearlean Alf, MD;  Location: [facility];  Service: Orthopedics;  Laterality: Right;  . Cardiac catheterization  03/28/08    patent  grafts, nl EF  . Pacemaker insertion  12/15/06    medtronic  . US echocardiography  12/28/2007    LA mod. dilated,RA mildly dilated  . Nm myocar perf wall motion  02/12/2012    small fixed distal anteroapical/apical defect. No reversible ischemia    There were no vitals filed for this visit.  Visit Diagnosis:  Decreased coordination  Weakness generalized  Impaired functional mobility and activity tolerance      Subjective Assessment - 02/02/15 1226    Currently in Pain? No/denies       Treatment: Reviewed PWR!modified quadraped exercises 1-3, 20 reps each standing at countertop, pt returned demonstration. Therapist finished checking goals. Discussed overall progress and pt fitness plans following d/c. Pt plans to attend silver sneakers. Therapist recommended that pt avoid use of weights with RUE overhead,due to old rotator cuff injury. Pt verbalized understanding. Pt plans to continue with theraband and PWR! exxercises at home.                         OT Short Term Goals - 02/02/15 1229    OT SHORT TERM GOAL #1   Title I with HEP   Baseline check 01/04/15  Time 4   Period Weeks   Status Achieved   OT SHORT TERM GOAL #2   Title Pt will demonstrate ability to perform functional reaching in standing x 15 mins without LOB or rest breaks.   Time 4   Period Weeks   Status Achieved  With chair and/or countertop for close support prn   OT SHORT TERM GOAL #3   Title Pt will verbalize understanding of adapted strategies for ADLS/IADLS to increase pt safety, independence and minimize spills   Time 4   Period Weeks   Status Achieved           OT Long Term Goals - 03-02-15 1208    OT LONG TERM GOAL #1   Title Pt will demonstrate improved standing balance as evidenced by increasing standing functional reach by 2 inches bilaterally(from baseline.)   Baseline  9 inches bilaterally, met for RUE, not LUE   Time 8   Period Weeks   Status Partially Met   OT  LONG TERM GOAL #2   Title Pt will demonstrate ability to perfom dynamic functional reaching activities in standing without LOB in prep for improved ADL/IADL performance.   Time 8   Period Weeks   Status Achieved   OT LONG TERM GOAL #3   Title Pt will demonstrate improved fine motor coordination as evidenced by decreasing bilateral 9 hole peg test scores to 35 secs or less without any drops.   Baseline met for RUE(32.13 secs) not for LUE (39.12)   Time 8   Period Weeks   Status Partially Met   OT LONG TERM GOAL #4   Title Pt will demonstrate ability to write a short paragraph without decrease in letter size and 100% legibility.   Baseline met 01/04/15   Time 8   Period Weeks   Status Achieved   OT LONG TERM GOAL #5   Title I with updated HEP.   Baseline Upgraded theraband, PWR! modified quadraped   Time 8   Period Weeks   Status Achieved               Plan - 03-02-15 1227    Clinical Impression Statement Pt made excellent overall progress. He plans to continue exercising at the [facility] after he finishes therapy.   Pt will benefit from skilled therapeutic intervention in order to improve on the following deficits (Retired) Abnormal gait;Impaired flexibility;Pain;Decreased mobility;Decreased coordination;Decreased activity tolerance;Decreased endurance;Decreased range of motion;Decreased strength;Impaired UE functional use;Decreased safety awareness;Difficulty walking;Decreased balance   Rehab Potential Good   Clinical Impairments Affecting Rehab Potential decreased coordination, tremor, decreased strength, decreased activity tolerance   OT Frequency 2x / week   OT Duration 8 weeks   OT Treatment/Interventions Self-care/ADL training;Therapeutic exercise;Neuromuscular education;Energy conservation;Therapeutic exercises;Patient/family education;Balance training;Therapeutic activities;DME and/or AE instruction   Plan discharge OT,    OT Home Exercise Plan Pt has theraband HEP,  modified quadraped PWR!   Consulted and Agree with Plan of Care Patient          G-Codes - 03/02/15 1348    Functional Assessment Tool Used 9 hole peg test RUE 32.13 secs, LUE 39.12, decreased LOB overall   Functional Limitation Self care   Self Care Goal Status ([id number]) At least 1 percent but less than 20 percent impaired, limited or restricted   Self Care Discharge Status [phone]) At least 20 percent but less than 40 percent impaired, limited or restricted      Problem List Patient Active Problem List   Diagnosis Date  Noted  . Pacemaker 07/02/2013  . CAD (coronary artery disease) of artery bypass graft 02/16/2013  . HTN (hypertension) 02/16/2013  . Hyperlipidemia 02/16/2013  . Postoperative anemia due to acute blood loss 01/20/2013  . Postop Hyponatremia 01/18/2013  . OA (osteoarthritis) of knee 01/17/2013  . Atrial fibrillation 12/21/2012  . Long term (current) use of anticoagulants 12/21/2012  OCCUPATIONAL THERAPY DISCHARGE SUMMARY   Current functional level related to goals / functional outcomes: Pt met 3/5, long term goals and demonstrates overall improved balance for ADLs/IADLs.   Remaining deficits: Decreased coordination, decreased balance,sensory impairment   Education / Equipment: Pt was instructed in the following: HEP and strategies for handwriting. Pt verbalized understanding of all education.  Plan: Patient agrees to discharge.  Patient goals were partially met. Patient is being discharged due to meeting the stated rehab goals.  ?????      Harly Pipkins 02/02/2015, 1:51 PM Theone Murdoch, OTR/L [phone] Phone: [phone] 1:51 PM 02/02/2015 [facility] [address] [place], [name], [address] Phone: [phone]   Fax:  [phone]

## 2015-02-02 NOTE — Therapy (Signed)
[facility] [address] [place], [name], [address] Phone: [phone]   Fax:  [phone]  Physical Therapy Treatment  Patient Details  Name: Mike Price MRN: [id number] Date of Birth: Jul 28, 1935 Referring Provider:  [name], MD  Encounter Date: 02/02/2015      PT End of Session - 02/02/15 1218    Visit Number 15   Number of Visits 18   Date for PT Re-Evaluation 02/02/15, requesting additional 4 weeks (2x/week) so POC would end 03/03/15.   Authorization Type G-code every 10th visit.   PT Start Time 1110   PT Stop Time 1149   PT Time Calculation (min) 39 min   Equipment Utilized During Treatment Gait belt   Activity Tolerance Patient tolerated treatment well   Behavior During Therapy WFL for tasks assessed/performed      Past Medical History  Diagnosis Date  . Hypertension   . Thyroid disease   . TMJ tenderness 01-11-13    right side-has issues from time to time.  . Pacemaker 12/15/06    3'08  . Dysrhythmia 01-11-13     hx. A. Fib-Pacemaker implanted left chest  . Hypothyroidism   . Anxiety 01-11-13    tx. Clonazepam.  . Edema extremities 01-11-13    retains fluid in legs-right greater than left.  . Neuromuscular disorder     neuropathy in feet  . GERD (gastroesophageal reflux disease)   . Arthritis     Osteoarthritis-knees, fingers  . Anemia   . CAD (coronary artery disease)   . Hyperlipemia   . Obstructive sleep apnea     on C Pap    Past Surgical History  Procedure Laterality Date  . Coronary artery bypass graft  2001    4 vessels-'00  . Tonsillectomy      child  . Appendectomy    . Insert / replace / remove pacemaker      '08-inserted left chest  . Cervical laminectomy    . Hernia repair    . Joint replacement      LTHA  . Total knee arthroplasty Right 01/17/2013    Procedure: RIGHT TOTAL KNEE ARTHROPLASTY;  Surgeon: Gearlean Alf, MD;  Location: [facility];  Service: Orthopedics;   Laterality: Right;  . Cardiac catheterization  03/28/08    patent grafts, nl EF  . Pacemaker insertion  12/15/06    medtronic  . US echocardiography  12/28/2007    LA mod. dilated,RA mildly dilated  . Nm myocar perf wall motion  02/12/2012    small fixed distal anteroapical/apical defect. No reversible ischemia    There were no vitals filed for this visit.  Visit Diagnosis:  Abnormality of gait - Plan: PT plan of care cert/re-cert  Weakness generalized - Plan: PT plan of care cert/re-cert      Subjective Assessment - 02/02/15 1113    Subjective Pt arrived 10 minutes late to PT session. Pt denied falls and changes since last visit. Pt ceased Symbicort and cough medication. Pt reported he is fatigued, as he was up early for a breakfast this morning.   Patient Stated Goals To walk better and more stable   Currently in Pain? No/denies        Therex: reviewed back exercises, as pt reported he was not sure on technique, hold time and reps. VC's and demonstration for technique. -Hooklying pelvic tilts x10 with 2-3 second hold. -Single knee to chest stretch with B LEs: 2x30sec. Holds. -Double knee to chest  stretch 2x30 sec. Holds. -B LTR stretch 2x30sec. Hold. -PT also educated pt on log rolling when getting in/out of bed to reduce back pain, pt performed x2.                   [facility] Adult PT Treatment/Exercise - 02/02/15 1134    Ambulation/Gait   Ambulation/Gait Yes   Ambulation/Gait Assistance 5: Supervision;4: Min guard   Ambulation/Gait Assistance Details Pt ambulated over even/uneven terrain, min guard required when ambulating over grassy terrain to ensure safety. VC's to improve upright posture, stride length, and to improve weight shifting while traversing uneven terrain. Pt required two seated rest breaks durng amb., pt's SaO2 on room air was 90% after amb. and improve to >92% after rest and pursed lip breathing.   Ambulation Distance (Feet) --  300'x2, 75'    Assistive device Straight cane   Gait Pattern Step-through pattern;Decreased stride length;Trunk flexed   Ambulation Surface Level;Unlevel;Indoor;Outdoor;Paved;Grass                PT Education - 02/02/15 1218    Education provided Yes   Education Details Reviewed back exercises and wrote reps and hold time in pt's book.   Person(s) Educated Patient   Methods Explanation;Demonstration;Verbal cues;Handout   Comprehension Verbalized understanding;Returned demonstration          PT Short Term Goals - 01/04/15 1601    PT SHORT TERM GOAL #1   Title demonstrates understanding of initial HEP (Target Date: 01/04/15)   Baseline met on 01/04/15   Status Achieved   PT SHORT TERM GOAL #2   Title verbalizes understanding of fall prevention strategies in home (Target Date: 01/04/15)   Baseline met on 01/04/15   Status Achieved   PT SHORT TERM GOAL #3   Title ambulates 200' with single point cane with cues for gait. (Target Date: 01/04/15)   Baseline met on 01/04/15   Status Achieved   PT SHORT TERM GOAL #4   Title negotiates ramp & curb with single point cane with minimal assist. (Target Date: 01/04/15)   Baseline met on 01/04/15   Status Achieved           PT Long Term Goals - 02/02/15 1221    PT LONG TERM GOAL #1   Title verbalizes / demonstrates understanding of fitness plan /ongoing HEP. (Target Date: 02/02/15)   Baseline All on-going goals updated to new target date for new POC.   Time 60   Period Days   Status Achieved  Plans to use silver sneakers and work out at [employer]   PT [place] #2   Title ambulates 300' with LRAD safely modified independent. (Target Date: 02/02/15)   Time 60   Period Days   Status Achieved   PT LONG TERM GOAL #3   Title negotiates ramp, curb & stairs (1 rail) with LRAD modified independent. (Target Date: 03/02/15)   Time 60   Period Days   Status On-going  Supervision on 01/30/15   PT LONG TERM GOAL #4   Title Berg Balance >32/  56 (Target Date: 02/02/15)   Baseline 4/7- 46/56 score, see new BERG goal below.   Status Revised  43/56   PT LONG TERM GOAL #5   Title Timed Up & Go with & without cane <13.5 seconds (Target Date: 03/02/15)   Time 60   Period Days   Status On-going   Additional Long Term Goals   Additional Long Term Goals Yes  PT LONG TERM GOAL #6   Title Pt will improve BERG score to >/=47/56 to decrease falls risk. Target date: 03/02/15.   Status Revised               Plan - 02/02/15 1218    Clinical Impression Statement Pt demonstrated progress, as he was able to ambulate over uneven terrain. Pt continues to be limited by fatigue, as he required seated rest breaks during session. PT sent recert request to MD, as pt would benefit from skilled PT to improve safety during functional mobility.   Pt will benefit from skilled therapeutic intervention in order to improve on the following deficits Abnormal gait;Decreased activity tolerance;Decreased balance;Decreased endurance;Decreased knowledge of use of DME;Decreased mobility;Decreased range of motion;Decreased strength;Postural dysfunction   Rehab Potential Good   PT Frequency 2x / week   PT Duration Other (comment)  renew for additional 4 weeks per primary PT, total of 13 weeks   PT Treatment/Interventions ADLs/Self Care Home Management;DME Instruction;Gait training;Stair training;Functional mobility training;Therapeutic activities;Therapeutic exercise;Balance training;Neuromuscular re-education;Patient/family education   PT Next Visit Plan Progress dynamic gait training over uneven/compliant surfaces, traverse steps/curbs with SPC   PT Home Exercise Plan ROM & strength of ankles & hips, balance   Consulted and Agree with Plan of Care Patient        Problem List Patient Active Problem List   Diagnosis Date Noted  . Pacemaker 07/02/2013  . CAD (coronary artery disease) of artery bypass graft 02/16/2013  . HTN (hypertension) 02/16/2013  .  Hyperlipidemia 02/16/2013  . Postoperative anemia due to acute blood loss 01/20/2013  . Postop Hyponatremia 01/18/2013  . OA (osteoarthritis) of knee 01/17/2013  . Atrial fibrillation 12/21/2012  . Long term (current) use of anticoagulants 12/21/2012    Arlo Butt L 02/02/2015, 12:25 PM  [facility] [address] [place], [name], [address] Phone: [phone]   Fax:  [phone]    Geoffry Paradise, PT,DPT 02/02/2015 12:25 PM Phone: [phone] Fax: [phone]

## 2015-02-07 ENCOUNTER — Ambulatory Visit: Payer: Medicare Other | Attending: Internal Medicine

## 2015-02-07 VITALS — HR 86

## 2015-02-07 DIAGNOSIS — R279 Unspecified lack of coordination: Secondary | ICD-10-CM | POA: Insufficient documentation

## 2015-02-07 DIAGNOSIS — R269 Unspecified abnormalities of gait and mobility: Secondary | ICD-10-CM | POA: Diagnosis not present

## 2015-02-07 DIAGNOSIS — R29818 Other symptoms and signs involving the nervous system: Secondary | ICD-10-CM | POA: Insufficient documentation

## 2015-02-07 DIAGNOSIS — Z7409 Other reduced mobility: Secondary | ICD-10-CM | POA: Diagnosis not present

## 2015-02-07 DIAGNOSIS — M259 Joint disorder, unspecified: Secondary | ICD-10-CM | POA: Diagnosis not present

## 2015-02-07 DIAGNOSIS — R531 Weakness: Secondary | ICD-10-CM | POA: Diagnosis not present

## 2015-02-07 NOTE — Therapy (Signed)
[facility] [address] [place], [name], [address] Phone: [phone]   Fax:  [phone]  Physical Therapy Treatment  Patient Details  Name: Mike Price MRN: [id number] Date of Birth: 1935/02/25 Referring Provider:  [name], MD  Encounter Date: 02/07/2015      PT End of Session - 02/07/15 0918    Visit Number 16   Number of Visits 26   Date for PT Re-Evaluation 03/03/15   Authorization Type G-code every 10th visit.   PT Start Time 0810   PT Stop Time 0843   PT Time Calculation (min) 33 min      Past Medical History  Diagnosis Date  . Hypertension   . Thyroid disease   . TMJ tenderness 01-11-13    right side-has issues from time to time.  . Pacemaker 12/15/06    3'08  . Dysrhythmia 01-11-13     hx. A. Fib-Pacemaker implanted left chest  . Hypothyroidism   . Anxiety 01-11-13    tx. Clonazepam.  . Edema extremities 01-11-13    retains fluid in legs-right greater than left.  . Neuromuscular disorder     neuropathy in feet  . GERD (gastroesophageal reflux disease)   . Arthritis     Osteoarthritis-knees, fingers  . Anemia   . CAD (coronary artery disease)   . Hyperlipemia   . Obstructive sleep apnea     on C Pap    Past Surgical History  Procedure Laterality Date  . Coronary artery bypass graft  2001    4 vessels-'00  . Tonsillectomy      child  . Appendectomy    . Insert / replace / remove pacemaker      '08-inserted left chest  . Cervical laminectomy    . Hernia repair    . Joint replacement      LTHA  . Total knee arthroplasty Right 01/17/2013    Procedure: RIGHT TOTAL KNEE ARTHROPLASTY;  Surgeon: Gearlean Alf, MD;  Location: [facility];  Service: Orthopedics;  Laterality: Right;  . Cardiac catheterization  03/28/08    patent grafts, nl EF  . Pacemaker insertion  12/15/06    medtronic  . US echocardiography  12/28/2007    LA mod. dilated,RA mildly dilated  . Nm myocar perf wall motion   02/12/2012    small fixed distal anteroapical/apical defect. No reversible ischemia    Filed Vitals:   02/07/15 0838  Pulse: 86  SpO2: 97%    Visit Diagnosis:  Abnormality of gait      Subjective Assessment - 02/07/15 0812    Subjective PT arrived 10 minutes late since last session. Pt denied falls or changes since last visit. Pt reports he is not happy with his home carpeting, as his rugs get wrinkled and they've tried to tape rugs and they still bunch up.   Patient Stated Goals To walk better and more stable   Currently in Pain? No/denies                         [facility] Adult PT Treatment/Exercise - 02/07/15 0814    Ambulation/Gait   Ambulation/Gait Yes   Ambulation/Gait Assistance 5: Supervision;4: Min guard   Ambulation/Gait Assistance Details Pt ambulated over uneven terrain and compliant surfaces while performing head turns, 180 degree turns, and counting white cars outdoors. Pt experienced 1 LOB episodes while ambulating over compliant surfaces while performing vertical head turns but pt self corrected with  stepping strategy and UE support on SPC, VC's to improve B heel strike, stride length, and sequencing with SPC. Pt required one seated rest break due to fatigue. SaO2 on room air 97% and HR 86bpm after amb.   Ambulation Distance (Feet) --  500', 50'x2, 200'   Assistive device Straight cane   Gait Pattern Step-through pattern;Decreased stride length;Trunk flexed   Ambulation Surface Level;Unlevel;Indoor;Outdoor;Paved;Other (comment)  compliant surface   Stairs Yes   Stairs Assistance 5: Supervision;4: Min guard   Stairs Assistance Details (indicate cue type and reason) VC's to improve ant. weight shifting.   Stair Management Technique One rail Right;Alternating pattern;Forwards   Number of Stairs 8   Height of Stairs 6   Ramp 5: Supervision   Ramp Details (indicate cue type and reason) x2. Cues for weight shifting.   Curb --  min guard   Curb Details  (indicate cue type and reason) x3. VC's for sequencing.                  PT Short Term Goals - 01/04/15 1601    PT SHORT TERM GOAL #1   Title demonstrates understanding of initial HEP (Target Date: 01/04/15)   Baseline met on 01/04/15   Status Achieved   PT SHORT TERM GOAL #2   Title verbalizes understanding of fall prevention strategies in home (Target Date: 01/04/15)   Baseline met on 01/04/15   Status Achieved   PT SHORT TERM GOAL #3   Title ambulates 200' with single point cane with cues for gait. (Target Date: 01/04/15)   Baseline met on 01/04/15   Status Achieved   PT SHORT TERM GOAL #4   Title negotiates ramp & curb with single point cane with minimal assist. (Target Date: 01/04/15)   Baseline met on 01/04/15   Status Achieved           PT Long Term Goals - 02/02/15 1221    PT LONG TERM GOAL #1   Title verbalizes / demonstrates understanding of fitness plan /ongoing HEP. (Target Date: 02/02/15)   Baseline All on-going goals updated to new target date for new POC.   Time 60   Period Days   Status Achieved  Plans to use silver sneakers and work out at [employer]   PT [place] #2   Title ambulates 300' with LRAD safely modified independent. (Target Date: 02/02/15)   Time 60   Period Days   Status Achieved   PT LONG TERM GOAL #3   Title negotiates ramp, curb & stairs (1 rail) with LRAD modified independent. (Target Date: 03/02/15)   Time 60   Period Days   Status On-going  Supervision on 01/30/15   PT LONG TERM GOAL #4   Title Berg Balance >32/ 56 (Target Date: 02/02/15)   Baseline 4/7- 46/56 score, see new BERG goal below.   Status Revised  43/56   PT LONG TERM GOAL #5   Title Timed Up & Go with & without cane <13.5 seconds (Target Date: 03/02/15)   Time 60   Period Days   Status On-going   Additional Long Term Goals   Additional Long Term Goals Yes   PT LONG TERM GOAL #6   Title Pt will improve BERG score to >/=47/56 to decrease falls risk.  Target date: 03/02/15.   Status Revised               Plan - 02/07/15 0920    Clinical Impression Statement Pt demonstrated  progress, as he required less rest breaks today and was able to ambulate over compliant surfaces. Pt continues to experience impaired balance and decreased speed during ambulation while performing head turns. Continue with POC.   Pt will benefit from skilled therapeutic intervention in order to improve on the following deficits Abnormal gait;Decreased activity tolerance;Decreased balance;Decreased endurance;Decreased knowledge of use of DME;Decreased mobility;Decreased range of motion;Decreased strength;Postural dysfunction   Rehab Potential Good   PT Frequency 2x / week   PT Duration Other (comment)  13 weeks   PT Treatment/Interventions ADLs/Self Care Home Management;DME Instruction;Gait training;Stair training;Functional mobility training;Therapeutic activities;Therapeutic exercise;Balance training;Neuromuscular re-education;Patient/family education   PT Next Visit Plan Progress dynamic gait training over uneven/compliant surfaces, traverse steps/curbs with SPC. Strengthening (L LE)   PT Home Exercise Plan ROM & strength of ankles & hips, balance   Consulted and Agree with Plan of Care Patient        Problem List Patient Active Problem List   Diagnosis Date Noted  . Pacemaker 07/02/2013  . CAD (coronary artery disease) of artery bypass graft 02/16/2013  . HTN (hypertension) 02/16/2013  . Hyperlipidemia 02/16/2013  . Postoperative anemia due to acute blood loss 01/20/2013  . Postop Hyponatremia 01/18/2013  . OA (osteoarthritis) of knee 01/17/2013  . Atrial fibrillation 12/21/2012  . Long term (current) use of anticoagulants 12/21/2012    Keydi Giel L 02/07/2015, 9:22 AM  [facility] [address] [place], [name], [address] Phone: [phone]   Fax:  [phone]     Geoffry Paradise, PT,DPT 02/07/2015 9:22 AM Phone: [phone] Fax: [phone]

## 2015-02-12 ENCOUNTER — Encounter: Payer: Medicare Other | Admitting: Occupational Therapy

## 2015-02-12 ENCOUNTER — Ambulatory Visit: Payer: Medicare Other | Admitting: Physical Therapy

## 2015-02-14 ENCOUNTER — Ambulatory Visit: Payer: Medicare Other | Admitting: Rehabilitative and Restorative Service Providers"

## 2015-02-14 ENCOUNTER — Ambulatory Visit: Payer: Medicare Other | Admitting: Physical Therapy

## 2015-02-14 ENCOUNTER — Encounter: Payer: Self-pay | Admitting: Physical Therapy

## 2015-02-14 ENCOUNTER — Encounter: Payer: Medicare Other | Admitting: Occupational Therapy

## 2015-02-14 DIAGNOSIS — Z7409 Other reduced mobility: Secondary | ICD-10-CM

## 2015-02-14 DIAGNOSIS — R279 Unspecified lack of coordination: Secondary | ICD-10-CM | POA: Diagnosis not present

## 2015-02-14 DIAGNOSIS — R531 Weakness: Secondary | ICD-10-CM

## 2015-02-14 DIAGNOSIS — R269 Unspecified abnormalities of gait and mobility: Secondary | ICD-10-CM

## 2015-02-14 NOTE — Therapy (Signed)
[facility] [address] [place], [name], [address] Phone: [phone]   Fax:  [phone]  Physical Therapy Treatment  Patient Details  Name: Mike Price MRN: [id number] Date of Birth: [birth year] Referring Provider:  [name], MD  Encounter Date: 02/14/2015      PT End of Session - 02/14/15 1238    Visit Number 17   Number of Visits 26   Date for PT Re-Evaluation 03/03/15   Authorization Type G-code every 10th visit.   PT Start Time 1235   PT Stop Time 1315   PT Time Calculation (min) 40 min   Equipment Utilized During Treatment Gait belt   Activity Tolerance Patient tolerated treatment well   Behavior During Therapy WFL for tasks assessed/performed      Past Medical History  Diagnosis Date  . Hypertension   . Thyroid disease   . TMJ tenderness 01-11-13    right side-has issues from time to time.  . Pacemaker 12/15/06    3'08  . Dysrhythmia 01-11-13     hx. A. Fib-Pacemaker implanted left chest  . Hypothyroidism   . Anxiety 01-11-13    tx. Clonazepam.  . Edema extremities 01-11-13    retains fluid in legs-right greater than left.  . Neuromuscular disorder     neuropathy in feet  . GERD (gastroesophageal reflux disease)   . Arthritis     Osteoarthritis-knees, fingers  . Anemia   . CAD (coronary artery disease)   . Hyperlipemia   . Obstructive sleep apnea     on C Pap    Past Surgical History  Procedure Laterality Date  . Coronary artery bypass graft  2001    4 vessels-'00  . Tonsillectomy      child  . Appendectomy    . Insert / replace / remove pacemaker      '08-inserted left chest  . Cervical laminectomy    . Hernia repair    . Joint replacement      LTHA  . Total knee arthroplasty Right 01/17/2013    Procedure: RIGHT TOTAL KNEE ARTHROPLASTY;  Surgeon: Gearlean Alf, MD;  Location: [facility];  Service: Orthopedics;  Laterality: Right;  . Cardiac catheterization  03/28/08    patent  grafts, nl EF  . Pacemaker insertion  12/15/06    medtronic  . US echocardiography  12/28/2007    LA mod. dilated,RA mildly dilated  . Nm myocar perf wall motion  02/12/2012    small fixed distal anteroapical/apical defect. No reversible ischemia    There were no vitals filed for this visit.  Visit Diagnosis:  Abnormality of gait  Weakness generalized  Impaired functional mobility and activity tolerance      Subjective Assessment - 02/14/15 1237    Subjective No new complaints. Some soreness from moving boxes/etc around at home. No pain or falls to report.   Currently in Pain? No/denies   Pain Score 0-No pain            [facility] Adult PT Treatment/Exercise - 02/14/15 1240    Ambulation/Gait   Ambulation/Gait Yes   Ambulation/Gait Assistance 5: Supervision   Ambulation/Gait Assistance Details cues on posture and for equal step length with gait. SaO2 94% after gait.                             Ambulation Distance (Feet) --  >/= 600   Assistive device Straight cane  Gait Pattern Step-through pattern;Decreased stride length;Trunk flexed   Ambulation Surface Level;Unlevel;Indoor;Outdoor;Paved;Gravel;Grass  pineneedles   Curb 4: Min assist  to min guard assist   Curb Details (indicate cue type and reason) x4 with cues on sequence. increased time needed with curb negotiation.   Knee/Hip Exercises: Standing   Heel Raises 1 set;10 reps;5 seconds  2# ankle weights   Knee Flexion AAROM;Strengthening;Both;1 set;10 reps  2# ankle weights   Hip ADduction AROM;Both;1 set;10 reps  23 ankle weights   Other Standing Knee Exercises toe raises with 2# ankle weights 5 sec hold, x 10 reps   Other Standing Knee Exercises alternating hipl extension with 2# ankle weights x 10 each leg                            Knee/Hip Exercises: Seated   Long Arc Quad Strengthening;Both;1 set;10 reps  5 sec holds   Long Arc Quad Weight 2 lbs.   Other Seated Knee Exercises sit/stand no UE assist 2 sets of  10 reps with short rest between sets.           PT Short Term Goals - 01/04/15 1601    PT SHORT TERM GOAL #1   Title demonstrates understanding of initial HEP (Target Date: 01/04/15)   Baseline met on 01/04/15   Status Achieved   PT SHORT TERM GOAL #2   Title verbalizes understanding of fall prevention strategies in home (Target Date: 01/04/15)   Baseline met on 01/04/15   Status Achieved   PT SHORT TERM GOAL #3   Title ambulates 200' with single point cane with cues for gait. (Target Date: 01/04/15)   Baseline met on 01/04/15   Status Achieved   PT SHORT TERM GOAL #4   Title negotiates ramp & curb with single point cane with minimal assist. (Target Date: 01/04/15)   Baseline met on 01/04/15   Status Achieved           PT Long Term Goals - 02/02/15 1221    PT LONG TERM GOAL #1   Title verbalizes / demonstrates understanding of fitness plan /ongoing HEP. (Target Date: 02/02/15)   Baseline All on-going goals updated to new target date for new POC.   Time 60   Period Days   Status Achieved  Plans to use silver sneakers and work out at [employer]   PT [place] #2   Title ambulates 300' with LRAD safely modified independent. (Target Date: 02/02/15)   Time 60   Period Days   Status Achieved   PT LONG TERM GOAL #3   Title negotiates ramp, curb & stairs (1 rail) with LRAD modified independent. (Target Date: 03/02/15)   Time 60   Period Days   Status On-going  Supervision on 01/30/15   PT LONG TERM GOAL #4   Title Berg Balance >32/ 56 (Target Date: 02/02/15)   Baseline 4/7- 46/56 score, see new BERG goal below.   Status Revised  43/56   PT LONG TERM GOAL #5   Title Timed Up & Go with & without cane <13.5 seconds (Target Date: 03/02/15)   Time 60   Period Days   Status On-going   Additional Long Term Goals   Additional Long Term Goals Yes   PT LONG TERM GOAL #6   Title Pt will improve BERG score to >/=47/56 to decrease falls risk. Target date: 03/02/15.   Status  Revised  Plan - 02/14/15 1239    Clinical Impression Statement Pt making steady progress toward goals. Still with decreased gait speed and fatigued easily with higher level activities.    Pt will benefit from skilled therapeutic intervention in order to improve on the following deficits Abnormal gait;Decreased activity tolerance;Decreased balance;Decreased endurance;Decreased knowledge of use of DME;Decreased mobility;Decreased range of motion;Decreased strength;Postural dysfunction   Rehab Potential Good   PT Frequency 2x / week   PT Duration Other (comment)  13 weeks   PT Treatment/Interventions ADLs/Self Care Home Management;DME Instruction;Gait training;Stair training;Functional mobility training;Therapeutic activities;Therapeutic exercise;Balance training;Neuromuscular re-education;Patient/family education   PT Next Visit Plan Progress dynamic gait training over uneven/compliant surfaces, traverse steps/curbs with SPC. Strengthening (L LE)   PT Home Exercise Plan ROM & strength of ankles & hips, balance   Consulted and Agree with Plan of Care Patient      Problem List Patient Active Problem List   Diagnosis Date Noted  . Pacemaker 07/02/2013  . CAD (coronary artery disease) of artery bypass graft 02/16/2013  . HTN (hypertension) 02/16/2013  . Hyperlipidemia 02/16/2013  . Postoperative anemia due to acute blood loss 01/20/2013  . Postop Hyponatremia 01/18/2013  . OA (osteoarthritis) of knee 01/17/2013  . Atrial fibrillation 12/21/2012  . Long term (current) use of anticoagulants 12/21/2012    Willow Ora 02/14/2015, 8:29 PM  Willow Ora, PTA, [facility] [address] [place], [place] [address] [phone] 02/14/2015, 8:29 PM

## 2015-02-16 ENCOUNTER — Ambulatory Visit: Payer: Medicare Other | Admitting: Physical Therapy

## 2015-02-16 ENCOUNTER — Encounter: Payer: Self-pay | Admitting: Physical Therapy

## 2015-02-16 DIAGNOSIS — R269 Unspecified abnormalities of gait and mobility: Secondary | ICD-10-CM

## 2015-02-16 DIAGNOSIS — R279 Unspecified lack of coordination: Secondary | ICD-10-CM | POA: Diagnosis not present

## 2015-02-16 DIAGNOSIS — R531 Weakness: Secondary | ICD-10-CM

## 2015-02-16 DIAGNOSIS — Z7409 Other reduced mobility: Secondary | ICD-10-CM

## 2015-02-16 NOTE — Therapy (Signed)
[facility] [address] [place], [name], [address] Phone: [phone]   Fax:  [phone]  Physical Therapy Treatment  Patient Details  Name: Mike Price MRN: [id number] Date of Birth: 12/12/1934 Referring Provider:  [name], MD  Encounter Date: 02/16/2015      PT End of Session - 02/16/15 0852    Visit Number 18   Number of Visits 26   Date for PT Re-Evaluation 03/03/15   Authorization Type G-code every 10th visit.   PT Start Time [id number]   PT Stop Time 0926   PT Time Calculation (min) 40 min   Equipment Utilized During Treatment Gait belt   Activity Tolerance Patient tolerated treatment well   Behavior During Therapy [facility] for tasks assessed/performed      Past Medical History  Diagnosis Date  . Hypertension   . Thyroid disease   . TMJ tenderness 01-11-13    right side-has issues from time to time.  . Pacemaker 12/15/06    3'08  . Dysrhythmia 01-11-13     hx. A. Fib-Pacemaker implanted left chest  . Hypothyroidism   . Anxiety 01-11-13    tx. Clonazepam.  . Edema extremities 01-11-13    retains fluid in legs-right greater than left.  . Neuromuscular disorder     neuropathy in feet  . GERD (gastroesophageal reflux disease)   . Arthritis     Osteoarthritis-knees, fingers  . Anemia   . CAD (coronary artery disease)   . Hyperlipemia   . Obstructive sleep apnea     on C Pap    Past Surgical History  Procedure Laterality Date  . Coronary artery bypass graft  2001    4 vessels-'00  . Tonsillectomy      child  . Appendectomy    . Insert / replace / remove pacemaker      '08-inserted left chest  . Cervical laminectomy    . Hernia repair    . Joint replacement      LTHA  . Total knee arthroplasty Right 01/17/2013    Procedure: RIGHT TOTAL KNEE ARTHROPLASTY;  Surgeon: Gearlean Alf, MD;  Location: [facility];  Service: Orthopedics;  Laterality: Right;  . Cardiac catheterization  03/28/08    patent  grafts, nl EF  . Pacemaker insertion  12/15/06    medtronic  . US echocardiography  12/28/2007    LA mod. dilated,RA mildly dilated  . Nm myocar perf wall motion  02/12/2012    small fixed distal anteroapical/apical defect. No reversible ischemia    There were no vitals filed for this visit.  Visit Diagnosis:  Abnormality of gait  Weakness generalized  Impaired functional mobility and activity tolerance      Subjective Assessment - 02/16/15 0851    Subjective No falls to report. Did not sleep well last night due to cpap machine acting up. Has been also walking more since last visit and now his left knee is feeling weak.   Currently in Pain? No/denies   Pain Score 0-No pain            [facility] Adult PT Treatment/Exercise - 02/16/15 0854    Transfers   Sit to Stand 5: Supervision;With upper extremity assist;With armrests;From chair/3-in-1   Stand to Sit 5: Supervision;With upper extremity assist;With armrests;To chair/3-in-1   Ambulation/Gait   Ambulation/Gait Yes   Ambulation/Gait Assistance 5: Supervision   Ambulation/Gait Assistance Details occasional cues on posture and increased step length   Ambulation Distance (Feet) 660  Feet  x1   Assistive device Straight cane   Gait Pattern Step-through pattern;Decreased stride length;Trunk flexed   Ambulation Surface Level;Indoor   Knee/Hip Exercises: Standing   Heel Raises 1 set;10 reps  2# ankle weights   Knee Flexion AROM;Strengthening;Both;1 set;10 reps  2# ankle weights   Hip ADduction AROM;Strengthening;Both;1 set;10 reps  2# ankle weights   Other Standing Knee Exercises toe raises with 2# ankle weights 5 sec hold, x 10 reps   Other Standing Knee Exercises alternating hip extension with 2# ankle weights x 10 each leg; alternating slow marching with 2# ankle weights x 10 each leg.                            Knee/Hip Exercises: Seated   Long Arc Quad Strengthening;Both;1 set;10 reps   Long Arc Quad Weight 2 lbs.   [employer] Limitations 5 sec holds   Other Seated Knee Exercises sit/stand no UE assist 1 sets of 10 reps with short rest between sets.           PT Short Term Goals - 01/04/15 1601    PT SHORT TERM GOAL #1   Title demonstrates understanding of initial HEP (Target Date: 01/04/15)   Baseline met on 01/04/15   Status Achieved   PT SHORT TERM GOAL #2   Title verbalizes understanding of fall prevention strategies in home (Target Date: 01/04/15)   Baseline met on 01/04/15   Status Achieved   PT SHORT TERM GOAL #3   Title ambulates 200' with single point cane with cues for gait. (Target Date: 01/04/15)   Baseline met on 01/04/15   Status Achieved   PT SHORT TERM GOAL #4   Title negotiates ramp & curb with single point cane with minimal assist. (Target Date: 01/04/15)   Baseline met on 01/04/15   Status Achieved           PT Long Term Goals - 02/02/15 1221    PT LONG TERM GOAL #1   Title verbalizes / demonstrates understanding of fitness plan /ongoing HEP. (Target Date: 02/02/15)   Baseline All on-going goals updated to new target date for new POC.   Time 60   Period Days   Status Achieved  Plans to use silver sneakers and work out at [employer]   PT [place] #2   Title ambulates 300' with LRAD safely modified independent. (Target Date: 02/02/15)   Time 60   Period Days   Status Achieved   PT LONG TERM GOAL #3   Title negotiates ramp, curb & stairs (1 rail) with LRAD modified independent. (Target Date: 03/02/15)   Time 60   Period Days   Status On-going  Supervision on 01/30/15   PT LONG TERM GOAL #4   Title Berg Balance >32/ 56 (Target Date: 02/02/15)   Baseline 4/7- 46/56 score, see new BERG goal below.   Status Revised  43/56   PT LONG TERM GOAL #5   Title Timed Up & Go with & without cane <13.5 seconds (Target Date: 03/02/15)   Time 60   Period Days   Status On-going   Additional Long Term Goals   Additional Long Term Goals Yes   PT LONG TERM GOAL #6   Title Pt  will improve BERG score to >/=47/56 to decrease falls risk. Target date: 03/02/15.   Status Revised  Plan - 02/16/15 6010    Clinical Impression Statement Focused on strengthening today with no issues reported. Pt making steady progress toward goals.   Pt will benefit from skilled therapeutic intervention in order to improve on the following deficits Abnormal gait;Decreased activity tolerance;Decreased balance;Decreased endurance;Decreased knowledge of use of DME;Decreased mobility;Decreased range of motion;Decreased strength;Postural dysfunction   Rehab Potential Good   PT Frequency 2x / week   PT Duration Other (comment)  13 weeks   PT Treatment/Interventions ADLs/Self Care Home Management;DME Instruction;Gait training;Stair training;Functional mobility training;Therapeutic activities;Therapeutic exercise;Balance training;Neuromuscular re-education;Patient/family education   PT Next Visit Plan Progress dynamic gait training over uneven/compliant surfaces, traverse steps/curbs with SPC. Strengthening (L LE)   PT Home Exercise Plan ROM & strength of ankles & hips, balance   Consulted and Agree with Plan of Care Patient        Problem List Patient Active Problem List   Diagnosis Date Noted  . Pacemaker 07/02/2013  . CAD (coronary artery disease) of artery bypass graft 02/16/2013  . HTN (hypertension) 02/16/2013  . Hyperlipidemia 02/16/2013  . Postoperative anemia due to acute blood loss 01/20/2013  . Postop Hyponatremia 01/18/2013  . OA (osteoarthritis) of knee 01/17/2013  . Atrial fibrillation 12/21/2012  . Long term (current) use of anticoagulants 12/21/2012    Willow Ora 02/16/2015, 7:35 PM  Willow Ora, PTA, [facility] [address], [place], [place] [address] [phone] 02/16/2015, 7:35 PM

## 2015-02-19 ENCOUNTER — Encounter: Payer: Medicare Other | Admitting: Occupational Therapy

## 2015-02-19 ENCOUNTER — Ambulatory Visit: Payer: Medicare Other | Admitting: Physical Therapy

## 2015-02-19 ENCOUNTER — Ambulatory Visit: Payer: Medicare Other | Admitting: Rehabilitative and Restorative Service Providers"

## 2015-02-19 ENCOUNTER — Encounter: Payer: Self-pay | Admitting: Physical Therapy

## 2015-02-19 VITALS — BP 159/87 | HR 74

## 2015-02-19 DIAGNOSIS — R2689 Other abnormalities of gait and mobility: Secondary | ICD-10-CM

## 2015-02-19 DIAGNOSIS — Z7409 Other reduced mobility: Secondary | ICD-10-CM

## 2015-02-19 DIAGNOSIS — R531 Weakness: Secondary | ICD-10-CM

## 2015-02-19 DIAGNOSIS — R269 Unspecified abnormalities of gait and mobility: Secondary | ICD-10-CM

## 2015-02-19 DIAGNOSIS — R279 Unspecified lack of coordination: Secondary | ICD-10-CM | POA: Diagnosis not present

## 2015-02-20 NOTE — Therapy (Signed)
[facility] [address] [place], [name], [address] Phone: [phone]   Fax:  [phone]  Physical Therapy Treatment  Patient Details  Name: Mike Price MRN: [id number] Date of Birth: January 08, 1935 Referring Provider:  [name], MD  Encounter Date: 02/19/2015      PT End of Session - 02/19/15 1324    Visit Number 19   Number of Visits 26   Date for PT Re-Evaluation 03/03/15   Authorization Type G-code every 10th visit.   PT Start Time 1319   PT Stop Time 1400   PT Time Calculation (min) 41 min   Equipment Utilized During Treatment Gait belt   Activity Tolerance Patient tolerated treatment well   Behavior During Therapy WFL for tasks assessed/performed      Past Medical History  Diagnosis Date  . Hypertension   . Thyroid disease   . TMJ tenderness 01-11-13    right side-has issues from time to time.  . Pacemaker 12/15/06    3'08  . Dysrhythmia 01-11-13     hx. A. Fib-Pacemaker implanted left chest  . Hypothyroidism   . Anxiety 01-11-13    tx. Clonazepam.  . Edema extremities 01-11-13    retains fluid in legs-right greater than left.  . Neuromuscular disorder     neuropathy in feet  . GERD (gastroesophageal reflux disease)   . Arthritis     Osteoarthritis-knees, fingers  . Anemia   . CAD (coronary artery disease)   . Hyperlipemia   . Obstructive sleep apnea     on C Pap    Past Surgical History  Procedure Laterality Date  . Coronary artery bypass graft  2001    4 vessels-'00  . Tonsillectomy      child  . Appendectomy    . Insert / replace / remove pacemaker      '08-inserted left chest  . Cervical laminectomy    . Hernia repair    . Joint replacement      LTHA  . Total knee arthroplasty Right 01/17/2013    Procedure: RIGHT TOTAL KNEE ARTHROPLASTY;  Surgeon: Gearlean Alf, MD;  Location: [facility];  Service: Orthopedics;  Laterality: Right;  . Cardiac catheterization  03/28/08    patent  grafts, nl EF  . Pacemaker insertion  12/15/06    medtronic  . US echocardiography  12/28/2007    LA mod. dilated,RA mildly dilated  . Nm myocar perf wall motion  02/12/2012    small fixed distal anteroapical/apical defect. No reversible ischemia    Filed Vitals:   02/19/15 1322  BP: 159/87  Pulse: 74    Visit Diagnosis:  Abnormality of gait  Weakness generalized  Balance problems  Impaired functional mobility and activity tolerance      Subjective Assessment - 02/19/15 1320    Subjective No falls. Reports having a "dizzy" spell on entering the building today, still having some dizziness. No fall, but near fall with this.    Currently in Pain? Yes   Pain Score 3    Pain Location Knee   Pain Orientation Left   Pain Descriptors / Indicators Sore;Aching   Pain Type Chronic pain   Pain Onset More than a month ago   Pain Frequency Intermittent   Aggravating Factors  increased activity   Pain Relieving Factors sitting           [facility] Adult PT Treatment/Exercise - 02/19/15 1325    Transfers   Sit to Stand 5:  Supervision;With upper extremity assist;With armrests;From chair/3-in-1   Stand to Sit 5: Supervision;With upper extremity assist;With armrests;To chair/3-in-1   Ambulation/Gait   Ambulation/Gait Yes   Ambulation/Gait Assistance 4: Min guard;5: Supervision   Ambulation/Gait Assistance Details increased assist on gravel and grass surfaces, especially down the grassy hill. no balance loss noted and no scuffing noted.   Ambulation Distance (Feet) 500 Feet   Assistive device Straight cane   Ambulation Surface Level;Unlevel;Indoor;Outdoor;Paved;Gravel;Grass   High Level Balance   High Level Balance Activities Side stepping;Marching forwards;Marching backwards;Negotiating over obstacles;Figure 8 turns  tandem fwd/bwd, heel/toe walk fwd/bwd   High Level Balance Comments red mats at counter: intermittent UE assist as needed with min guard assist and cues on posture/ex form, x  3 laps each. stepping over high/low hurdles x 6 laps with UE support;figure 8 x 2 laps with intermittent UE support, decreased step length without UE support.                                 PT Short Term Goals - 01/04/15 1601    PT SHORT TERM GOAL #1   Title demonstrates understanding of initial HEP (Target Date: 01/04/15)   Baseline met on 01/04/15   Status Achieved   PT SHORT TERM GOAL #2   Title verbalizes understanding of fall prevention strategies in home (Target Date: 01/04/15)   Baseline met on 01/04/15   Status Achieved   PT SHORT TERM GOAL #3   Title ambulates 200' with single point cane with cues for gait. (Target Date: 01/04/15)   Baseline met on 01/04/15   Status Achieved   PT SHORT TERM GOAL #4   Title negotiates ramp & curb with single point cane with minimal assist. (Target Date: 01/04/15)   Baseline met on 01/04/15   Status Achieved           PT Long Term Goals - 02/02/15 1221    PT LONG TERM GOAL #1   Title verbalizes / demonstrates understanding of fitness plan /ongoing HEP. (Target Date: 02/02/15)   Baseline All on-going goals updated to new target date for new POC.   Time 60   Period Days   Status Achieved  Plans to use silver sneakers and work out at [employer]   PT [place] #2   Title ambulates 300' with LRAD safely modified independent. (Target Date: 02/02/15)   Time 60   Period Days   Status Achieved   PT LONG TERM GOAL #3   Title negotiates ramp, curb & stairs (1 rail) with LRAD modified independent. (Target Date: 03/02/15)   Time 60   Period Days   Status On-going  Supervision on 01/30/15   PT LONG TERM GOAL #4   Title Berg Balance >32/ 56 (Target Date: 02/02/15)   Baseline 4/7- 46/56 score, see new BERG goal below.   Status Revised  43/56   PT LONG TERM GOAL #5   Title Timed Up & Go with & without cane <13.5 seconds (Target Date: 03/02/15)   Time 60   Period Days   Status On-going   Additional Long Term Goals   Additional Long  Term Goals Yes   PT LONG TERM GOAL #6   Title Pt will improve BERG score to >/=47/56 to decrease falls risk. Target date: 03/02/15.   Status Revised           Plan - 02/19/15 1324    Clinical  Impression Statement Worked on dynamic balance on complitant surfaces with no issues reported today. Pt making steady progress toward goals. Still challenged by complaint surfaces and narrowed base of support.   Pt will benefit from skilled therapeutic intervention in order to improve on the following deficits Abnormal gait;Decreased activity tolerance;Decreased balance;Decreased endurance;Decreased knowledge of use of DME;Decreased mobility;Decreased range of motion;Decreased strength;Postural dysfunction   Rehab Potential Good   PT Frequency 2x / week   PT Duration Other (comment)  13 weeks   PT Treatment/Interventions ADLs/Self Care Home Management;DME Instruction;Gait training;Stair training;Functional mobility training;Therapeutic activities;Therapeutic exercise;Balance training;Neuromuscular re-education;Patient/family education   PT Next Visit Plan Progress dynamic gait training over uneven/compliant surfaces, traverse steps/curbs with SPC. Strengthening (L LE)   PT Home Exercise Plan ROM & strength of ankles & hips, balance   Consulted and Agree with Plan of Care Patient        Problem List Patient Active Problem List   Diagnosis Date Noted  . Pacemaker 07/02/2013  . CAD (coronary artery disease) of artery bypass graft 02/16/2013  . HTN (hypertension) 02/16/2013  . Hyperlipidemia 02/16/2013  . Postoperative anemia due to acute blood loss 01/20/2013  . Postop Hyponatremia 01/18/2013  . OA (osteoarthritis) of knee 01/17/2013  . Atrial fibrillation 12/21/2012  . Long term (current) use of anticoagulants 12/21/2012    Willow Ora 02/20/2015, 10:43 AM  Willow Ora, PTA, [facility] [address] [place], [place] [address] [phone] 02/20/2015,  10:44 AM

## 2015-02-21 ENCOUNTER — Ambulatory Visit (INDEPENDENT_AMBULATORY_CARE_PROVIDER_SITE_OTHER): Payer: Medicare Other | Admitting: Pharmacist Clinician (PhC)/ Clinical Pharmacy Specialist

## 2015-02-21 DIAGNOSIS — Z7901 Long term (current) use of anticoagulants: Secondary | ICD-10-CM

## 2015-02-21 DIAGNOSIS — I4891 Unspecified atrial fibrillation: Secondary | ICD-10-CM

## 2015-02-21 LAB — POCT INR: INR: 3.1

## 2015-02-22 ENCOUNTER — Encounter: Payer: Medicare Other | Admitting: Occupational Therapy

## 2015-02-22 ENCOUNTER — Ambulatory Visit: Payer: Medicare Other | Admitting: Rehabilitative and Restorative Service Providers"

## 2015-02-23 ENCOUNTER — Ambulatory Visit: Payer: Medicare Other

## 2015-02-23 DIAGNOSIS — R269 Unspecified abnormalities of gait and mobility: Secondary | ICD-10-CM

## 2015-02-23 DIAGNOSIS — R279 Unspecified lack of coordination: Secondary | ICD-10-CM | POA: Diagnosis not present

## 2015-02-23 DIAGNOSIS — R2689 Other abnormalities of gait and mobility: Secondary | ICD-10-CM

## 2015-02-23 NOTE — Therapy (Signed)
[facility] [address] [place], [name], [address] Phone: [phone]   Fax:  [phone]  Physical Therapy Treatment  Patient Details  Name: Mike Price MRN: [id number] Date of Birth: 03/26/1935 Referring Provider:  [name], MD  Encounter Date: 02/23/2015      PT End of Session - 02/23/15 1417    Visit Number 20   Number of Visits 26   Date for PT Re-Evaluation 03/03/15   Authorization Type G-code every 10th visit.   PT Start Time 1325   PT Stop Time 1404   PT Time Calculation (min) 39 min   Equipment Utilized During Treatment Gait belt   Activity Tolerance Patient tolerated treatment well   Behavior During Therapy WFL for tasks assessed/performed      Past Medical History  Diagnosis Date  . Hypertension   . Thyroid disease   . TMJ tenderness 01-11-13    right side-has issues from time to time.  . Pacemaker 12/15/06    3'08  . Dysrhythmia 01-11-13     hx. A. Fib-Pacemaker implanted left chest  . Hypothyroidism   . Anxiety 01-11-13    tx. Clonazepam.  . Edema extremities 01-11-13    retains fluid in legs-right greater than left.  . Neuromuscular disorder     neuropathy in feet  . GERD (gastroesophageal reflux disease)   . Arthritis     Osteoarthritis-knees, fingers  . Anemia   . CAD (coronary artery disease)   . Hyperlipemia   . Obstructive sleep apnea     on C Pap    Past Surgical History  Procedure Laterality Date  . Coronary artery bypass graft  2001    4 vessels-'00  . Tonsillectomy      child  . Appendectomy    . Insert / replace / remove pacemaker      '08-inserted left chest  . Cervical laminectomy    . Hernia repair    . Joint replacement      LTHA  . Total knee arthroplasty Right 01/17/2013    Procedure: RIGHT TOTAL KNEE ARTHROPLASTY;  Surgeon: Gearlean Alf, MD;  Location: [facility];  Service: Orthopedics;  Laterality: Right;  . Cardiac catheterization  03/28/08    patent  grafts, nl EF  . Pacemaker insertion  12/15/06    medtronic  . US echocardiography  12/28/2007    LA mod. dilated,RA mildly dilated  . Nm myocar perf wall motion  02/12/2012    small fixed distal anteroapical/apical defect. No reversible ischemia    There were no vitals filed for this visit.  Visit Diagnosis:  Abnormality of gait  Balance problems      Subjective Assessment - 02/23/15 1328    Subjective Pt denied falls since last visit. However, pt reported that his L ankle was swollen and painful after Monday's session. Pt reported he iced his ankle but was having difficulty walking for about 2 days. He feels better, but 100% today. Pt also reported his R hip/low back has been hurting a little bit as well.   Patient Stated Goals To walk better and more stable   Currently in Pain? Yes   Pain Score --  2-3/10   Pain Location Ankle   Pain Orientation Left   Pain Descriptors / Indicators Aching   Pain Type Acute pain   Pain Onset In the past 7 days   Pain Frequency Intermittent   Aggravating Factors  walking   Pain Relieving Factors rest  Multiple Pain Sites Yes   Pain Score --  2-3/10   Pain Location Back   Pain Orientation Right;Lower   Pain Descriptors / Indicators Aching   Pain Type Chronic pain   Pain Onset More than a month ago   Pain Frequency Constant   Pain Relieving Factors rest                         [facility] Adult PT Treatment/Exercise - 02/23/15 1333    Ambulation/Gait   Ambulation/Gait Yes   Ambulation/Gait Assistance 4: Min guard;5: Supervision   Ambulation/Gait Assistance Details VC's to improve stride length. Pt demonstrated safe technique for traversing ramps outdoors. Pt required 2 seated rest breaks 2/2 fatigue. SaO2 on room air was 94% and HR 80bpm after amb.   Ambulation Distance (Feet) --  300'x2 with SPC outdoors, 75'x2 indoors   Assistive device Straight cane   Gait Pattern Step-through pattern;Decreased stride length;Trunk flexed    Ambulation Surface Level;Unlevel;Indoor;Outdoor;Paved;Gravel;Grass;Other (comment)  rubber mulch   Stairs Yes   Stairs Assistance 4: Min guard   Stairs Assistance Details (indicate cue type and reason) Min guard to ensure safety. Cues for sequencing.   Stair Management Technique No rails;With cane   Number of Stairs 4   Height of Stairs 6   Balance   Balance Assessed Yes   Dynamic Standing Balance   Dynamic Standing - Balance Support No upper extremity supported   Dynamic Standing - Level of Assistance 5: Stand by assistance;4: Min assist   Dynamic Standing - Balance Activities Alternating  foot traps   Dynamic Standing - Comments B LEs x10/activity: heel taps on dots, toe taps on 2" step and 6" cones. VC's to improve lateral weight shifting, pt required min A during LLE SLS activities.                PT Education - 02/23/15 1332    Education provided Yes   Education Details PT educated on performing back exercises to decrease back/hip pain. PT also educated pt on icing ankle no more than 10-15 minutes and to perform L gastroc stretch with sheet in seated position to improve plantar fascia flexibility.   Person(s) Educated Patient   Methods Explanation   Comprehension Verbalized understanding          PT Short Term Goals - 01/04/15 1601    PT SHORT TERM GOAL #1   Title demonstrates understanding of initial HEP (Target Date: 01/04/15)   Baseline met on 01/04/15   Status Achieved   PT SHORT TERM GOAL #2   Title verbalizes understanding of fall prevention strategies in home (Target Date: 01/04/15)   Baseline met on 01/04/15   Status Achieved   PT SHORT TERM GOAL #3   Title ambulates 200' with single point cane with cues for gait. (Target Date: 01/04/15)   Baseline met on 01/04/15   Status Achieved   PT SHORT TERM GOAL #4   Title negotiates ramp & curb with single point cane with minimal assist. (Target Date: 01/04/15)   Baseline met on 01/04/15   Status Achieved            PT Long Term Goals - 02/02/15 1221    PT LONG TERM GOAL #1   Title verbalizes / demonstrates understanding of fitness plan /ongoing HEP. (Target Date: 02/02/15)   Baseline All on-going goals updated to new target date for new POC.   Time 60   Period Days   Status  Achieved  Plans to use silver sneakers and work out at [employer] family [employer]   PT [place] #2   Title ambulates 300' with LRAD safely modified independent. (Target Date: 02/02/15)   Time 60   Period Days   Status Achieved   PT LONG TERM GOAL #3   Title negotiates ramp, curb & stairs (1 rail) with LRAD modified independent. (Target Date: 03/02/15)   Time 60   Period Days   Status On-going  Supervision on 01/30/15   PT LONG TERM GOAL #4   Title Berg Balance >32/ 56 (Target Date: 02/02/15)   Baseline 4/7- 46/56 score, see new BERG goal below.   Status Revised  43/56   PT LONG TERM GOAL #5   Title Timed Up & Go with & without cane <13.5 seconds (Target Date: 03/02/15)   Time 60   Period Days   Status On-going   Additional Long Term Goals   Additional Long Term Goals Yes   PT LONG TERM GOAL #6   Title Pt will improve BERG score to >/=47/56 to decrease falls risk. Target date: 03/02/15.   Status Revised               Plan - 03/04/15 1417    Clinical Impression Statement Pt demonstrated progress as he required less cues to traverse curbs and was able to ambulate over uneven terrain outdoors without LOB. Continue with POC.   Pt will benefit from skilled therapeutic intervention in order to improve on the following deficits Abnormal gait;Decreased activity tolerance;Decreased balance;Decreased endurance;Decreased knowledge of use of DME;Decreased mobility;Decreased range of motion;Decreased strength;Postural dysfunction   Rehab Potential Good   PT Frequency 2x / week   PT Duration Other (comment)  13 weeks   PT Treatment/Interventions ADLs/Self Care Home Management;DME Instruction;Gait training;Stair  training;Functional mobility training;Therapeutic activities;Therapeutic exercise;Balance training;Neuromuscular re-education;Patient/family education   PT Next Visit Plan Check LTGs and d/c if appropriate.   PT Home Exercise Plan ROM & strength of ankles & hips, balance   Consulted and Agree with Plan of Care Patient          G-Codes - 2015-03-04 1419    Functional Assessment Tool Used Berg Balance 43/56   Functional Limitation Mobility: Walking and moving around   Mobility: Walking and Moving Around Current Status [phone]) At least 40 percent but less than 60 percent impaired, limited or restricted   Mobility: Walking and Moving Around Goal Status [phone]) At least 20 percent but less than 40 percent impaired, limited or restricted      Problem List Patient Active Problem List   Diagnosis Date Noted  . Pacemaker 07/02/2013  . CAD (coronary artery disease) of artery bypass graft 02/16/2013  . HTN (hypertension) 02/16/2013  . Hyperlipidemia 02/16/2013  . Postoperative anemia due to acute blood loss 01/20/2013  . Postop Hyponatremia 01/18/2013  . OA (osteoarthritis) of knee 01/17/2013  . Atrial fibrillation 12/21/2012  . Long term (current) use of anticoagulants 12/21/2012    Shruti Arrey L 2015/03/04, 2:25 PM  [facility] [address] [place], [name], [address] Phone: [phone]   Fax:  [phone]     Geoffry Paradise, PT,DPT Mar 04, 2015 2:25 PM Phone: [phone] Fax: [phone]

## 2015-02-27 ENCOUNTER — Encounter: Payer: Self-pay | Admitting: Physical Therapy

## 2015-02-27 ENCOUNTER — Ambulatory Visit: Payer: Medicare Other | Admitting: Physical Therapy

## 2015-02-27 DIAGNOSIS — R279 Unspecified lack of coordination: Secondary | ICD-10-CM | POA: Diagnosis not present

## 2015-02-27 DIAGNOSIS — R2689 Other abnormalities of gait and mobility: Secondary | ICD-10-CM

## 2015-02-27 DIAGNOSIS — R531 Weakness: Secondary | ICD-10-CM

## 2015-02-27 DIAGNOSIS — R269 Unspecified abnormalities of gait and mobility: Secondary | ICD-10-CM

## 2015-02-27 DIAGNOSIS — Z7409 Other reduced mobility: Secondary | ICD-10-CM

## 2015-02-28 NOTE — Therapy (Signed)
[facility] [address] [place], [name], [address] Phone: [phone]   Fax:  [phone]  Physical Therapy Treatment  Patient Details  Name: Mike Price MRN: [id number] Date of Birth: [birth year] Referring Provider:  [name], MD  Encounter Date: 02/27/2015      PT End of Session - 02/27/15 1034    Visit Number 21   Number of Visits 26   Date for PT Re-Evaluation 03/03/15   Authorization Type G-code every 10th visit.   PT Start Time 1017   PT Stop Time 1055   PT Time Calculation (min) 38 min   Equipment Utilized During Treatment Gait belt   Activity Tolerance Patient tolerated treatment well   Behavior During Therapy WFL for tasks assessed/performed      Past Medical History  Diagnosis Date  . Hypertension   . Thyroid disease   . TMJ tenderness 01-11-13    right side-has issues from time to time.  . Pacemaker 12/15/06    3'08  . Dysrhythmia 01-11-13     hx. A. Fib-Pacemaker implanted left chest  . Hypothyroidism   . Anxiety 01-11-13    tx. Clonazepam.  . Edema extremities 01-11-13    retains fluid in legs-right greater than left.  . Neuromuscular disorder     neuropathy in feet  . GERD (gastroesophageal reflux disease)   . Arthritis     Osteoarthritis-knees, fingers  . Anemia   . CAD (coronary artery disease)   . Hyperlipemia   . Obstructive sleep apnea     on C Pap    Past Surgical History  Procedure Laterality Date  . Coronary artery bypass graft  2001    4 vessels-'00  . Tonsillectomy      child  . Appendectomy    . Insert / replace / remove pacemaker      '08-inserted left chest  . Cervical laminectomy    . Hernia repair    . Joint replacement      LTHA  . Total knee arthroplasty Right 01/17/2013    Procedure: RIGHT TOTAL KNEE ARTHROPLASTY;  Surgeon: Gearlean Alf, MD;  Location: [facility];  Service: Orthopedics;  Laterality: Right;  . Cardiac catheterization  03/28/08    patent  grafts, nl EF  . Pacemaker insertion  12/15/06    medtronic  . US echocardiography  12/28/2007    LA mod. dilated,RA mildly dilated  . Nm myocar perf wall motion  02/12/2012    small fixed distal anteroapical/apical defect. No reversible ischemia    There were no vitals filed for this visit.  Visit Diagnosis:  Abnormality of gait  Balance problems  Weakness generalized  Impaired functional mobility and activity tolerance      Subjective Assessment - 02/27/15 1023    Subjective Reports dispite going easy on last session, he still had the same issue of left ankle swelling and not being able to walk for couple days. Used ice and it helped. Feeling better today. Reports the pain is from the arch to the heel on the left foot.                                   Currently in Pain? Yes   Pain Score 4    Pain Location Foot   Pain Orientation Left   Pain Descriptors / Indicators Aching   Pain Type Acute pain   Pain Onset  1 to 4 weeks ago   Pain Frequency Intermittent   Aggravating Factors  increased walking/activity   Pain Relieving Factors rest          [facility] Adult PT Treatment/Exercise - 02/27/15 1040    Berg Balance Test   Sit to Stand Able to stand without using hands and stabilize independently   Standing Unsupported Able to stand safely 2 minutes   Sitting with Back Unsupported but Feet Supported on Floor or Stool Able to sit safely and securely 2 minutes   Stand to Sit Sits safely with minimal use of hands   Transfers Able to transfer safely, minor use of hands   Standing Unsupported with Eyes Closed Able to stand 10 seconds safely   Standing Ubsupported with Feet Together Able to place feet together independently and stand 1 minute safely   From Standing, Reach Forward with Outstretched Arm Can reach forward >12 cm safely (5")  8 inches   From Standing Position, Pick up Object from Floor Able to pick up shoe safely and easily   From Standing Position, Turn to Look Behind  Over each Shoulder Looks behind from both sides and weight shifts well   Turn 360 Degrees Able to turn 360 degrees safely but slowly   Standing Unsupported, Alternately Place Feet on Step/Stool Able to stand independently and safely and complete 8 steps in 20 seconds  12.25 sec's   Standing Unsupported, One Foot in Front Able to plae foot ahead of the other independently and hold 30 seconds   Standing on One Leg Tries to lift leg/unable to hold 3 seconds but remains standing independently   Total Score 49   Timed Up and Go Test   TUG Normal TUG   Normal TUG (seconds) 13.47     Pt performed plantar fascia stretching in the following way: to left foot With foam bowling pin x 10 reps With tennis ball x 10 reps. Educated pt to use frozen water bottle or tennis ball at home.         PT Short Term Goals - 01/04/15 1601    PT SHORT TERM GOAL #1   Title demonstrates understanding of initial HEP (Target Date: 01/04/15)   Baseline met on 01/04/15   Status Achieved   PT SHORT TERM GOAL #2   Title verbalizes understanding of fall prevention strategies in home (Target Date: 01/04/15)   Baseline met on 01/04/15   Status Achieved   PT SHORT TERM GOAL #3   Title ambulates 200' with single point cane with cues for gait. (Target Date: 01/04/15)   Baseline met on 01/04/15   Status Achieved   PT SHORT TERM GOAL #4   Title negotiates ramp & curb with single point cane with minimal assist. (Target Date: 01/04/15)   Baseline met on 01/04/15   Status Achieved           PT Long Term Goals - 02/28/15 1159    PT LONG TERM GOAL #1   Title verbalizes / demonstrates understanding of fitness plan /ongoing HEP. (Target Date: 02/02/15)   Baseline All on-going goals updated to new target date for new POC.   Time 60   Period Days   Status Achieved  Plans to use silver sneakers and work out at [employer]   PT [place] #2   Title ambulates 300' with LRAD safely modified independent. (Target  Date: 02/02/15)   Time 60   Period Days   Status Achieved  PT LONG TERM GOAL #3   Title negotiates ramp, curb & stairs (1 rail) with LRAD modified independent. (Target Date: 03/02/15)   Time 60   Period Days   Status On-going  Supervision on 01/30/15   PT LONG TERM GOAL #4   Title Berg Balance >32/ 56 (Target Date: 02/02/15)   Baseline 4/7- 46/56 score, see new BERG goal below.   Status Revised  43/56   PT LONG TERM GOAL #5   Title Timed Up & Go with & without cane <13.5 seconds (Target Date: 03/02/15)   Baseline 02/27/15- 13.47 sec's with cane   Time 60   Period Days   Status On-going   PT LONG TERM GOAL #6   Title Pt will improve BERG score to >/=47/56 to decrease falls risk. Target date: 03/02/15.   Baseline 02/27/15: scored 49/56.   Status Achieved            Plan - 02/27/15 1034    Clinical Impression Statement Pt met his long term berg balance test score and the timed up and go goal with cane. Will check remaining goals on next visit. Limited activity today due to pt with new onset of left foot/plantar fascia pain from increased activity.   Pt will benefit from skilled therapeutic intervention in order to improve on the following deficits Abnormal gait;Decreased activity tolerance;Decreased balance;Decreased endurance;Decreased knowledge of use of DME;Decreased mobility;Decreased range of motion;Decreased strength;Postural dysfunction   Rehab Potential Good   PT Frequency 2x / week   PT Duration Other (comment)  13 weeks   PT Treatment/Interventions ADLs/Self Care Home Management;DME Instruction;Gait training;Stair training;Functional mobility training;Therapeutic activities;Therapeutic exercise;Balance training;Neuromuscular re-education;Patient/family education   PT Next Visit Plan asses TUG without cane, stairs/ramp/curb with cane and finalize HEP.   PT Home Exercise Plan ROM & strength of ankles & hips, balance   Consulted and Agree with Plan of Care Patient         Problem List Patient Active Problem List   Diagnosis Date Noted  . Pacemaker 07/02/2013  . CAD (coronary artery disease) of artery bypass graft 02/16/2013  . HTN (hypertension) 02/16/2013  . Hyperlipidemia 02/16/2013  . Postoperative anemia due to acute blood loss 01/20/2013  . Postop Hyponatremia 01/18/2013  . OA (osteoarthritis) of knee 01/17/2013  . Atrial fibrillation 12/21/2012  . Long term (current) use of anticoagulants 12/21/2012    Willow Ora 02/28/2015, 12:02 PM  Willow Ora, PTA, [place] [address] [place], [place] [address] [phone] 02/28/2015, 12:02 PM

## 2015-03-01 ENCOUNTER — Ambulatory Visit: Payer: Medicare Other | Admitting: Physical Therapy

## 2015-03-02 ENCOUNTER — Encounter: Payer: Self-pay | Admitting: Physical Therapy

## 2015-03-02 ENCOUNTER — Ambulatory Visit: Payer: Medicare Other | Admitting: Physical Therapy

## 2015-03-02 DIAGNOSIS — R2689 Other abnormalities of gait and mobility: Secondary | ICD-10-CM

## 2015-03-02 DIAGNOSIS — R269 Unspecified abnormalities of gait and mobility: Secondary | ICD-10-CM

## 2015-03-02 DIAGNOSIS — R279 Unspecified lack of coordination: Secondary | ICD-10-CM | POA: Diagnosis not present

## 2015-03-02 NOTE — Therapy (Signed)
[facility] [address] [place], [name], [address] Phone: [phone]   Fax:  [phone]  Physical Therapy Treatment  Patient Details  Name: Mike Price MRN: [id number] Date of Birth: [birth year] Referring Provider:  [name], MD  Encounter Date: 03/02/2015      PT End of Session - 03/02/15 1111    Visit Number 22   Number of Visits 26   Date for PT Re-Evaluation 03/03/15   Authorization Type G-code every 10th visit.   PT Start Time 1106   PT Stop Time 1145   PT Time Calculation (min) 39 min   Equipment Utilized During Treatment Gait belt   Activity Tolerance Patient tolerated treatment well   Behavior During Therapy WFL for tasks assessed/performed      Past Medical History  Diagnosis Date  . Hypertension   . Thyroid disease   . TMJ tenderness 01-11-13    right side-has issues from time to time.  . Pacemaker 12/15/06    3'08  . Dysrhythmia 01-11-13     hx. A. Fib-Pacemaker implanted left chest  . Hypothyroidism   . Anxiety 01-11-13    tx. Clonazepam.  . Edema extremities 01-11-13    retains fluid in legs-right greater than left.  . Neuromuscular disorder     neuropathy in feet  . GERD (gastroesophageal reflux disease)   . Arthritis     Osteoarthritis-knees, fingers  . Anemia   . CAD (coronary artery disease)   . Hyperlipemia   . Obstructive sleep apnea     on C Pap    Past Surgical History  Procedure Laterality Date  . Coronary artery bypass graft  2001    4 vessels-'00  . Tonsillectomy      child  . Appendectomy    . Insert / replace / remove pacemaker      '08-inserted left chest  . Cervical laminectomy    . Hernia repair    . Joint replacement      LTHA  . Total knee arthroplasty Right 01/17/2013    Procedure: RIGHT TOTAL KNEE ARTHROPLASTY;  Surgeon: Gearlean Alf, MD;  Location: [facility];  Service: Orthopedics;  Laterality: Right;  . Cardiac catheterization  03/28/08    patent  grafts, nl EF  . Pacemaker insertion  12/15/06    medtronic  . US echocardiography  12/28/2007    LA mod. dilated,RA mildly dilated  . Nm myocar perf wall motion  02/12/2012    small fixed distal anteroapical/apical defect. No reversible ischemia    There were no vitals filed for this visit.  Visit Diagnosis:  Abnormality of gait  Balance problems      Subjective Assessment - 03/02/15 1107    Subjective No issues after last session. Reports the stretches shown last session are helping decrease the plantar fascia pain. No falls.    Currently in Pain? Yes   Pain Score 1    Pain Location Foot   Pain Orientation Left   Pain Descriptors / Indicators Sore   Pain Type Acute pain   Pain Frequency Intermittent   Aggravating Factors  increased activity/walking   Pain Relieving Factors rest            [facility] Adult PT Treatment/Exercise - 03/02/15 1112    Ambulation/Gait   Stairs Yes   Stairs Assistance 5: Supervision   Stair Management Technique One rail Right;Forwards;With cane   Number of Stairs 4   Ramp 5: Supervision   Ramp  Details (indicate cue type and reason) with cane   Curb 5: Supervision   Curb Details (indicate cue type and reason) with cane   Timed Up and Go Test   TUG Normal TUG   Normal TUG (seconds) 14.63  no device     Self Care: Pt educated on community fitness options and given information on [employer], silver sneakers and [facility]. Pt plans to pursue the silver sneakers at the [facility] near him. Reviewed current HEP and strategies on how to spread them out as he has a lot of options (ie, strengthening one day, balance next day, cardio the next, and so on). Pt verbalized understanding.        PT Short Term Goals - 01/04/15 1601    PT SHORT TERM GOAL #1   Title demonstrates understanding of initial HEP (Target Date: 01/04/15)   Baseline met on 01/04/15   Status Achieved   PT SHORT TERM GOAL #2   Title verbalizes understanding of fall prevention  strategies in home (Target Date: 01/04/15)   Baseline met on 01/04/15   Status Achieved   PT SHORT TERM GOAL #3   Title ambulates 200' with single point cane with cues for gait. (Target Date: 01/04/15)   Baseline met on 01/04/15   Status Achieved   PT SHORT TERM GOAL #4   Title negotiates ramp & curb with single point cane with minimal assist. (Target Date: 01/04/15)   Baseline met on 01/04/15   Status Achieved           PT Long Term Goals - 03/02/15 1608    PT LONG TERM GOAL #1   Title verbalizes / demonstrates understanding of fitness plan /ongoing HEP. (Target Date: 02/02/15)   Baseline 03/02/15: Planning on silver sneakers post rehab.   Status Achieved  Plans to use silver sneakers and work out at [employer]   PT [place] #2   Title ambulates 300' with LRAD safely modified independent. (Target Date: 02/02/15)   Status Achieved   PT LONG TERM GOAL #3   Title negotiates ramp, curb & stairs (1 rail) with LRAD modified independent. (Target Date: 03/02/15)   Baseline 03/02/15: needs 1 rail/cane on stairs mod I, supervision with ramp/curb with cane   Status Partially Met  Supervision on 01/30/15   PT LONG TERM GOAL #4   Title Berg Balance >32/ 56 (Target Date: 02/02/15)   Baseline 4/7- 46/56 score, see new BERG goal below.   Status Revised  43/56   PT LONG TERM GOAL #5   Title Timed Up & Go with & without cane <13.5 seconds (Target Date: 03/02/15)   Baseline 02/27/15- 13.47 sec's with cane; 03/02/15: without cane 14.63 sec's   Time 60   Period Days   Status Partially Met   PT LONG TERM GOAL #6   Title Pt will improve BERG score to >/=47/56 to decrease falls risk. Target date: 03/02/15.   Baseline 02/27/15: scored 49/56.   Status Achieved            Plan - 03/02/15 1111    Clinical Impression Statement Most long term goals met. Pt agreeable to discharge and community fitness to maintain current level of function.   Pt will benefit from skilled therapeutic intervention  in order to improve on the following deficits Abnormal gait;Decreased activity tolerance;Decreased balance;Decreased endurance;Decreased knowledge of use of DME;Decreased mobility;Decreased range of motion;Decreased strength;Postural dysfunction   Rehab Potential Good   PT Frequency 2x / week  PT Duration Other (comment)  13 weeks   PT Treatment/Interventions ADLs/Self Care Home Management;DME Instruction;Gait training;Stair training;Functional mobility training;Therapeutic activities;Therapeutic exercise;Balance training;Neuromuscular re-education;Patient/family education   PT Next Visit Plan discharge per PT plan of care.   PT Home Exercise Plan ROM & strength of ankles & hips, balance   Consulted and Agree with Plan of Care Patient        Problem List Patient Active Problem List   Diagnosis Date Noted  . Pacemaker 07/02/2013  . CAD (coronary artery disease) of artery bypass graft 02/16/2013  . HTN (hypertension) 02/16/2013  . Hyperlipidemia 02/16/2013  . Postoperative anemia due to acute blood loss 01/20/2013  . Postop Hyponatremia 01/18/2013  . OA (osteoarthritis) of knee 01/17/2013  . Atrial fibrillation 12/21/2012  . Long term (current) use of anticoagulants 12/21/2012    Willow Ora 03/02/2015, 4:15 PM  Willow Ora, PTA, [facility] [address] [place], [place] [address] [phone] 03/02/2015, 4:15 PM

## 2015-03-12 ENCOUNTER — Telehealth: Payer: Self-pay | Admitting: Cardiovascular Disease

## 2015-03-12 NOTE — Telephone Encounter (Signed)
LMOVM for pt to return call 

## 2015-03-12 NOTE — Telephone Encounter (Signed)
New Message      Pt calling stating that he wants to speak to Pamala Hurry so he can get some guidance on how to set up his new monitor and how to do his upcoming remote transmission. Please call back and advise.

## 2015-03-12 NOTE — Telephone Encounter (Signed)
Spoke w/ pt and informed that wirex takes place of landline phone and everything else works the same. Pt verbalized understanding.

## 2015-03-14 ENCOUNTER — Ambulatory Visit (INDEPENDENT_AMBULATORY_CARE_PROVIDER_SITE_OTHER): Payer: Medicare Other | Admitting: *Deleted

## 2015-03-14 DIAGNOSIS — I442 Atrioventricular block, complete: Secondary | ICD-10-CM

## 2015-03-14 NOTE — Progress Notes (Signed)
Remote pacemaker transmission.   

## 2015-03-18 LAB — CUP PACEART REMOTE DEVICE CHECK
Battery Impedance: 3467 Ohm
Battery Remaining Longevity: 15 mo
Battery Voltage: 2.68 V
Brady Statistic RV Percent Paced: 100 %
Lead Channel Impedance Value: 67 Ohm
Lead Channel Pacing Threshold Pulse Width: 0.4 ms
Lead Channel Setting Sensing Sensitivity: 5.6 mV
MDC IDC MSMT LEADCHNL RV IMPEDANCE VALUE: 751 Ohm
MDC IDC MSMT LEADCHNL RV PACING THRESHOLD AMPLITUDE: 1 V
MDC IDC SESS DTM: 20160608103239
MDC IDC SET LEADCHNL RV PACING AMPLITUDE: 2.5 V
MDC IDC SET LEADCHNL RV PACING PULSEWIDTH: 0.4 ms

## 2015-03-21 ENCOUNTER — Ambulatory Visit (INDEPENDENT_AMBULATORY_CARE_PROVIDER_SITE_OTHER): Payer: Medicare Other | Admitting: Pharmacist Clinician (PhC)/ Clinical Pharmacy Specialist

## 2015-03-21 DIAGNOSIS — Z7901 Long term (current) use of anticoagulants: Secondary | ICD-10-CM

## 2015-03-21 DIAGNOSIS — I4891 Unspecified atrial fibrillation: Secondary | ICD-10-CM

## 2015-03-21 LAB — POCT INR: INR: 2.6

## 2015-03-27 ENCOUNTER — Encounter: Payer: Self-pay | Admitting: Cardiology

## 2015-03-28 ENCOUNTER — Encounter: Payer: Self-pay | Admitting: Cardiovascular Disease

## 2015-04-03 ENCOUNTER — Telehealth: Payer: Self-pay | Admitting: Pharmacist Clinician (PhC)/ Clinical Pharmacy Specialist

## 2015-04-03 MED ORDER — WARFARIN SODIUM 5 MG PO TABS: ORAL_TABLET | ORAL | Status: DC

## 2015-04-03 NOTE — Telephone Encounter (Signed)
On Augmentin 500 mg bid for diverticulitis, started Friday (while on vacation in [place]).  Assured patient this will be okay, unless develops diarrhea, in which he should call.  Pt voiced understanding

## 2015-04-18 ENCOUNTER — Ambulatory Visit (INDEPENDENT_AMBULATORY_CARE_PROVIDER_SITE_OTHER): Payer: Medicare Other | Admitting: Pharmacist Clinician (PhC)/ Clinical Pharmacy Specialist

## 2015-04-18 DIAGNOSIS — I4891 Unspecified atrial fibrillation: Secondary | ICD-10-CM | POA: Diagnosis not present

## 2015-04-18 DIAGNOSIS — Z7901 Long term (current) use of anticoagulants: Secondary | ICD-10-CM

## 2015-04-18 LAB — POCT INR: INR: 2.7

## 2015-04-23 ENCOUNTER — Emergency Department (INDEPENDENT_AMBULATORY_CARE_PROVIDER_SITE_OTHER)
Admission: EM | Admit: 2015-04-23 | Discharge: 2015-04-23 | Disposition: A | Discharge status: Home / Self Care | Payer: Medicare Other | Source: Home / Self Care | Attending: Family Medicine | Admitting: Family Medicine

## 2015-04-23 ENCOUNTER — Encounter (HOSPITAL_COMMUNITY): Payer: Self-pay | Admitting: Emergency Medicine

## 2015-04-23 ENCOUNTER — Emergency Department (INDEPENDENT_AMBULATORY_CARE_PROVIDER_SITE_OTHER): Payer: Medicare Other

## 2015-04-23 ENCOUNTER — Telehealth: Payer: Self-pay | Admitting: Pharmacist Clinician (PhC)/ Clinical Pharmacy Specialist

## 2015-04-23 DIAGNOSIS — S0081XA Abrasion of other part of head, initial encounter: Secondary | ICD-10-CM | POA: Diagnosis not present

## 2015-04-23 DIAGNOSIS — S8001XA Contusion of right knee, initial encounter: Secondary | ICD-10-CM

## 2015-04-23 DIAGNOSIS — S46811A Strain of other muscles, fascia and tendons at shoulder and upper arm level, right arm, initial encounter: Secondary | ICD-10-CM | POA: Diagnosis not present

## 2015-04-23 DIAGNOSIS — W102XXA Fall (on)(from) incline, initial encounter: Secondary | ICD-10-CM

## 2015-04-23 NOTE — ED Provider Notes (Signed)
CSN: [id number]     Arrival date & time 04/23/15  1301 History   First MD Initiated Contact with Patient 04/23/15 1327     Chief Complaint  Patient presents with  . Fall  . Knee Injury   (Consider location/radiation/quality/duration/timing/severity/associated sxs/prior Treatment) HPI Comments: 80 year old male states that he was outside, stumbled, tripped and fell onto the grass striking his forehead and face. He also injured his left knee. He presents today feeling generally well although he has some soreness to the right trapezius muscle as well as pain and swelling to the left knee. He has a history of severe osteoarthritis to the left knee. He is currently taking Coumadin for CAD. He is sitting in a well chair. Fully awake alert and oriented. Speech is lucid and goal oriented. He has no problem with recall, memory, speech, or cognitive function. He remembers the events of last night, through the night and today. He denies headache, focal paresthesias or numbness. Denies problems with vision, speech, hearing, swallowing.   Past Medical History  Diagnosis Date  . Hypertension   . Thyroid disease   . TMJ tenderness 01-11-13    right side-has issues from time to time.  . Pacemaker 12/15/06    3'08  . Dysrhythmia 01-11-13     hx. A. Fib-Pacemaker implanted left chest  . Hypothyroidism   . Anxiety 01-11-13    tx. Clonazepam.  . Edema extremities 01-11-13    retains fluid in legs-right greater than left.  . Neuromuscular disorder     neuropathy in feet  . GERD (gastroesophageal reflux disease)   . Arthritis     Osteoarthritis-knees, fingers  . Anemia   . CAD (coronary artery disease)   . Hyperlipemia   . Obstructive sleep apnea     on C Pap   Past Surgical History  Procedure Laterality Date  . Coronary artery bypass graft  2001    4 vessels-'00  . Tonsillectomy      child  . Appendectomy    . Insert / replace / remove pacemaker      '08-inserted left chest  . Cervical laminectomy     . Hernia repair    . Joint replacement      LTHA  . Total knee arthroplasty Right 01/17/2013    Procedure: RIGHT TOTAL KNEE ARTHROPLASTY;  Surgeon: Gearlean Alf, MD;  Location: [facility];  Service: Orthopedics;  Laterality: Right;  . Cardiac catheterization  03/28/08    patent grafts, nl EF  . Pacemaker insertion  12/15/06    medtronic  . US echocardiography  12/28/2007    LA mod. dilated,RA mildly dilated  . Nm myocar perf wall motion  02/12/2012    small fixed distal anteroapical/apical defect. No reversible ischemia   No family history on file. History  Substance Use Topics  . Smoking status: Former Smoker    Quit date: 01/12/1984  . Smokeless tobacco: Not on file  . Alcohol Use: 1.8 oz/week    3 Shots of liquor per week     Comment:  3 ounces daily    Review of Systems  Constitutional: Negative for activity change, appetite change and fatigue.  HENT: Negative for congestion, drooling, ear discharge, ear pain, postnasal drip, rhinorrhea, sinus pressure, sore throat, tinnitus, trouble swallowing and voice change.   Eyes: Negative.   Respiratory: Negative.   Cardiovascular: Negative.   Gastrointestinal: Negative.   Genitourinary: Negative.   Musculoskeletal: Positive for arthralgias, gait problem and neck pain. Negative for back  pain and neck stiffness.  Skin:       Superficial abrasions to the low mid forehead.  Neurological: Negative for dizziness, tremors, seizures, syncope, facial asymmetry, speech difficulty, weakness, light-headedness and numbness.    Allergies  Lisinopril and Mefloquine  Home Medications   Prior to Admission medications   Medication Sig Start Date End Date Taking? Authorizing Provider  aspirin 81 MG tablet Take 81 mg by mouth daily.   Yes Historical Provider, MD  atorvastatin (LIPITOR) 40 MG tablet Take 40 mg by mouth every evening.    Yes Historical Provider, MD  doxazosin (CARDURA) 2 MG tablet Take 1 mg by mouth at bedtime.   Yes Historical  Provider, MD  ezetimibe (ZETIA) 10 MG tablet Take 10 mg by mouth every evening.    Yes Historical Provider, MD  ferrous sulfate 325 (65 FE) MG tablet Take 325 mg by mouth every other day. On Monday, Wednesday and Friday   Yes Historical Provider, MD  furosemide (LASIX) 20 MG tablet Take 10 mg by mouth daily.    Yes Historical Provider, MD  levothyroxine (SYNTHROID, LEVOTHROID) 75 MCG tablet Take 75 mcg by mouth daily before breakfast.    Yes Historical Provider, MD  losartan (COZAAR) 100 MG tablet Take 100 mg by mouth daily.   Yes Historical Provider, MD  metoprolol (LOPRESSOR) 100 MG tablet Take 100 mg by mouth 2 (two) times daily.   Yes Historical Provider, MD  omeprazole (PRILOSEC) 20 MG capsule Take 20 mg by mouth 2 (two) times daily.   Yes Historical Provider, MD  traMADol (ULTRAM) 50 MG tablet Take 50 mg by mouth every 6 (six) hours as needed for pain.   Yes Historical Provider, MD  warfarin (COUMADIN) 5 MG tablet Take 1 to 1.5 tablets by mouth daily as directed by coumadin clinic 04/03/15  Yes Lorretta Harp, MD  acetaminophen (TYLENOL) 325 MG tablet Take 2 tablets (650 mg total) by mouth every 6 (six) hours as needed. 01/20/13   Arlee Muslim, PA-C  bisacodyl (DULCOLAX) 10 MG suppository Place 1 suppository (10 mg total) rectally daily as needed. 01/20/13   Arlee Muslim, PA-C  clonazePAM (KLONOPIN) 0.5 MG tablet Take 0.25-0.5 mg by mouth 3 (three) times daily as needed. Takes 1/2 tablet twice daily then a whole tablet at bedtime    Historical Provider, MD  metroNIDAZOLE (FLAGYL) 500 MG tablet Take 500 mg by mouth 2 (two) times daily. 07/11/14   Historical Provider, MD  nitrofurantoin (MACRODANTIN) 100 MG capsule Take 100 mg by mouth 2 (two) times daily.  05/03/14   Historical Provider, MD  NON FORMULARY CPAP qhs    Historical Provider, MD   BP 149/83 mmHg  Pulse 73  Temp(Src) 97.3 F (36.3 C) (Oral)  Resp 16  SpO2 96% Physical Exam  Constitutional: He is oriented to person, place, and  time. He appears well-developed and well-nourished. No distress.  HENT:  Right Ear: External ear normal.  Left Ear: External ear normal.  Mouth/Throat: Oropharynx is clear and moist. No oropharyngeal exudate.  Eyes: Conjunctivae and EOM are normal. Pupils are equal, round, and reactive to light.  Neck: Normal range of motion. Neck supple.  Tenderness to the ridge of the right trapezius muscle only. No spinal tenderness, deformity, swelling or discoloration. Full range of motion.  Cardiovascular: Normal rate, regular rhythm and normal heart sounds.   Pulmonary/Chest: Effort normal and breath sounds normal. No respiratory distress. He has no wheezes. He has no rales.  Abdominal: Soft. There is  no tenderness.  Musculoskeletal: He exhibits no edema.  Swelling, tenderness, ecchymosis and pain to the left knee primarily tender just proximal to the patella. Extension to 180. Flexion to 100.  Lymphadenopathy:    He has no cervical adenopathy.  Neurological: He is alert and oriented to person, place, and time. He exhibits normal muscle tone.  Skin: Skin is warm and dry. No rash noted.  Psychiatric: He has a normal mood and affect.  Nursing note and vitals reviewed.   ED Course  Procedures (including critical care time) Labs Review Labs Reviewed - No data to display  Imaging Review Dg Knee Complete 4 Views Left  04/23/2015   CLINICAL DATA:  Pain following fall  EXAM: LEFT KNEE - COMPLETE 4+ VIEW  COMPARISON:  None.  FINDINGS: Frontal, lateral, and bilateral oblique views obtained. There is extensive prepatellar soft tissue edema. There is a small joint effusion. There is no fracture or dislocation. There is fairly marked narrowing medially. There is spurring in all compartments, largest along the posterior patella. There is extensive atherosclerotic vascular calcification.  IMPRESSION: No fracture or dislocation. Prepatellar soft tissue edema. There may well be prepatellar hemorrhage. Small joint  effusion. Osteoarthritic change with narrowing most marked medially. Atherosclerotic vascular calcification.   Electronically Signed   By: Lowella Grip III M.D.   On: 04/23/2015 14:13     MDM   1. Fall (on)(from) incline, initial encounter   2. Abrasion of forehead, initial encounter   3. Trapezius strain, right, initial encounter   4. Knee contusion, right, initial encounter     Neurologic exam is unremarkable. Patient remains alert awake oriented with clear lucid speech. His primary complaint is that of the left knee with her swelling and tenderness. The x-ray is negative for fracture positive for degenerative joint disease for which he knew he had. There is an effusion of the knee and most likely bleeding around the knee probably exacerbated by the Coumadin. We will treat the knee with cold packs eyes and a knee wrap for a couple days to help with compression and bleeding control. He has been given head injury instruction sheet as well as discussion of head injury with he and his wife. Heat to the right side of his neck. Follow-up with your PCP in 2 days. For any new symptoms problems or worsening as discussed or as in written instruction go to the emergency department probably.    Janne Napoleon, NP 04/23/15 1424

## 2015-04-23 NOTE — Discharge Instructions (Signed)
Abrasion An abrasion is a cut or scrape of the skin. Abrasions do not extend through all layers of the skin and most heal within 10 days. It is important to care for your abrasion properly to prevent infection. CAUSES  Most abrasions are caused by falling on, or gliding across, the ground or other surface. When your skin rubs on something, the outer and inner layer of skin rubs off, causing an abrasion. DIAGNOSIS  Your caregiver will be able to diagnose an abrasion during a physical exam.  TREATMENT  Your treatment depends on how large and deep the abrasion is. Generally, your abrasion will be cleaned with water and a mild soap to remove any dirt or debris. An antibiotic ointment may be put over the abrasion to prevent an infection. A bandage (dressing) may be wrapped around the abrasion to keep it from getting dirty.  You may need a tetanus shot if:  You cannot remember when you had your last tetanus shot.  You have never had a tetanus shot.  The injury broke your skin. If you get a tetanus shot, your arm may swell, get red, and feel warm to the touch. This is common and not a problem. If you need a tetanus shot and you choose not to have one, there is a rare chance of getting tetanus. Sickness from tetanus can be serious.  HOME CARE INSTRUCTIONS   If a dressing was applied, change it at least once a day or as directed by your caregiver. If the bandage sticks, soak it off with warm water.   Wash the area with water and a mild soap to remove all the ointment 2 times a day. Rinse off the soap and pat the area dry with a clean towel.   Reapply any ointment as directed by your caregiver. This will help prevent infection and keep the bandage from sticking. Use gauze over the wound and under the dressing to help keep the bandage from sticking.   Change your dressing right away if it becomes wet or dirty.   Only take over-the-counter or prescription medicines for pain, discomfort, or fever as  directed by your caregiver.   Follow up with your caregiver within 24-48 hours for a wound check, or as directed. If you were not given a wound-check appointment, look closely at your abrasion for redness, swelling, or pus. These are signs of infection. SEEK IMMEDIATE MEDICAL CARE IF:   You have increasing pain in the wound.   You have redness, swelling, or tenderness around the wound.   You have pus coming from the wound.   You have a fever or persistent symptoms for more than 2-3 days.  You have a fever and your symptoms suddenly get worse.  You have a bad smell coming from the wound or dressing.  MAKE SURE YOU:   Understand these instructions.  Will watch your condition.  Will get help right away if you are not doing well or get worse. Document Released: 07/02/2005 Document Revised: 09/08/2012 Document Reviewed: 08/26/2011 [facility] Patient Information 2015 [place], [place]. This information is not intended to replace advice given to you by your health care provider. Make sure you discuss any questions you have with your health care provider.  Contusion A contusion is a deep bruise. Contusions are the result of an injury that caused bleeding under the skin. The contusion may turn blue, purple, or yellow. Minor injuries will give you a painless contusion, but more severe contusions may stay painful and swollen  for a few weeks.  CAUSES  A contusion is usually caused by a blow, trauma, or direct force to an area of the body. SYMPTOMS   Swelling and redness of the injured area.  Bruising of the injured area.  Tenderness and soreness of the injured area.  Pain. DIAGNOSIS  The diagnosis can be made by taking a history and physical exam. An X-ray, CT scan, or MRI may be needed to determine if there were any associated injuries, such as fractures. TREATMENT  Specific treatment will depend on what area of the body was injured. In general, the best treatment for a contusion is  resting, icing, elevating, and applying cold compresses to the injured area. Over-the-counter medicines may also be recommended for pain control. Ask your caregiver what the best treatment is for your contusion. HOME CARE INSTRUCTIONS   Put ice on the injured area.  Put ice in a plastic bag.  Place a towel between your skin and the bag.  Leave the ice on for 15-20 minutes, 3-4 times a day, or as directed by your health care provider.  Only take over-the-counter or prescription medicines for pain, discomfort, or fever as directed by your caregiver. Your caregiver may recommend avoiding anti-inflammatory medicines (aspirin, ibuprofen, and naproxen) for 48 hours because these medicines may increase bruising.  Rest the injured area.  If possible, elevate the injured area to reduce swelling. SEEK IMMEDIATE MEDICAL CARE IF:   You have increased bruising or swelling.  You have pain that is getting worse.  Your swelling or pain is not relieved with medicines. MAKE SURE YOU:   Understand these instructions.  Will watch your condition.  Will get help right away if you are not doing well or get worse. Document Released: 07/02/2005 Document Revised: 09/27/2013 Document Reviewed: 07/28/2011 [facility] Patient Information 2015 [place], [place]. This information is not intended to replace advice given to you by your health care provider. Make sure you discuss any questions you have with your health care provider.  Fall Prevention and Home Safety Falls cause injuries and can affect all age groups. It is possible to use preventive measures to significantly decrease the likelihood of falls. There are many simple measures which can make your home safer and prevent falls. OUTDOORS  Repair cracks and edges of walkways and driveways.  Remove high doorway thresholds.  Trim shrubbery on the main path into your home.  Have good outside lighting.  Clear walkways of tools, rocks, debris, and  clutter.  Check that handrails are not broken and are securely fastened. Both sides of steps should have handrails.  Have leaves, snow, and ice cleared regularly.  Use sand or salt on walkways during winter months.  In the garage, clean up grease or oil spills. BATHROOM  Install night lights.  Install grab bars by the toilet and in the tub and shower.  Use non-skid mats or decals in the tub or shower.  Place a plastic non-slip stool in the shower to sit on, if needed.  Keep floors dry and clean up all water on the floor immediately.  Remove soap buildup in the tub or shower on a regular basis.  Secure bath mats with non-slip, double-sided rug tape.  Remove throw rugs and tripping hazards from the floors. BEDROOMS  Install night lights.  Make sure a bedside light is easy to reach.  Do not use oversized bedding.  Keep a telephone by your bedside.  Have a firm chair with side arms to use for getting  dressed.  Remove throw rugs and tripping hazards from the floor. KITCHEN  Keep handles on pots and pans turned toward the center of the stove. Use back burners when possible.  Clean up spills quickly and allow time for drying.  Avoid walking on wet floors.  Avoid hot utensils and knives.  Position shelves so they are not too high or low.  Place commonly used objects within easy reach.  If necessary, use a sturdy step stool with a grab bar when reaching.  Keep electrical cables out of the way.  Do not use floor polish or wax that makes floors slippery. If you must use wax, use non-skid floor wax.  Remove throw rugs and tripping hazards from the floor. STAIRWAYS  Never leave objects on stairs.  Place handrails on both sides of stairways and use them. Fix any loose handrails. Make sure handrails on both sides of the stairways are as long as the stairs.  Check carpeting to make sure it is firmly attached along stairs. Make repairs to worn or loose carpet  promptly.  Avoid placing throw rugs at the top or bottom of stairways, or properly secure the rug with carpet tape to prevent slippage. Get rid of throw rugs, if possible.  Have an electrician put in a light switch at the top and bottom of the stairs. OTHER FALL PREVENTION TIPS  Wear low-heel or rubber-soled shoes that are supportive and fit well. Wear closed toe shoes.  When using a stepladder, make sure it is fully opened and both spreaders are firmly locked. Do not climb a closed stepladder.  Add color or contrast paint or tape to grab bars and handrails in your home. Place contrasting color strips on first and last steps.  Learn and use mobility aids as needed. Install an electrical emergency response system.  Turn on lights to avoid dark areas. Replace light bulbs that burn out immediately. Get light switches that glow.  Arrange furniture to create clear pathways. Keep furniture in the same place.  Firmly attach carpet with non-skid or double-sided tape.  Eliminate uneven floor surfaces.  Select a carpet pattern that does not visually hide the edge of steps.  Be aware of all pets. OTHER HOME SAFETY TIPS  Set the water temperature for 120 F (48.8 C).  Keep emergency numbers on or near the telephone.  Keep smoke detectors on every level of the home and near sleeping areas. Document Released: 09/12/2002 Document Revised: 03/23/2012 Document Reviewed: 12/12/2011 [facility] Patient Information 2015 [place], [place]. This information is not intended to replace advice given to you by your health care provider. Make sure you discuss any questions you have with your health care provider.  Head Injury You have received a head injury. It does not appear serious at this time. Headaches and vomiting are common following head injury. It should be easy to awaken from sleeping. Sometimes it is necessary for you to stay in the emergency department for a while for observation. Sometimes  admission to the hospital may be needed. After injuries such as yours, most problems occur within the first 24 hours, but side effects may occur up to 7-10 days after the injury. It is important for you to carefully monitor your condition and contact your health care provider or seek immediate medical care if there is a change in your condition. WHAT ARE THE TYPES OF HEAD INJURIES? Head injuries can be as minor as a bump. Some head injuries can be more severe. More severe head injuries  include:  A jarring injury to the brain (concussion).  A bruise of the brain (contusion). This mean there is bleeding in the brain that can cause swelling.  A cracked skull (skull fracture).  Bleeding in the brain that collects, clots, and forms a bump (hematoma). WHAT CAUSES A HEAD INJURY? A serious head injury is most likely to happen to someone who is in a car wreck and is not wearing a seat belt. Other causes of major head injuries include bicycle or motorcycle accidents, sports injuries, and falls. HOW ARE HEAD INJURIES DIAGNOSED? A complete history of the event leading to the injury and your current symptoms will be helpful in diagnosing head injuries. Many times, pictures of the brain, such as CT or MRI are needed to see the extent of the injury. Often, an overnight hospital stay is necessary for observation.  WHEN SHOULD I SEEK IMMEDIATE MEDICAL CARE?  You should get help right away if:  You have confusion or drowsiness.  You feel sick to your stomach (nauseous) or have continued, forceful vomiting.  You have dizziness or unsteadiness that is getting worse.  You have severe, continued headaches not relieved by medicine. Only take over-the-counter or prescription medicines for pain, fever, or discomfort as directed by your health care provider.  You do not have normal function of the arms or legs or are unable to walk.  You notice changes in the black spots in the center of the colored part of your  eye (pupil).  You have a clear or bloody fluid coming from your nose or ears.  You have a loss of vision. During the next 24 hours after the injury, you must stay with someone who can watch you for the warning signs. This person should contact local emergency services (911 in the [place] if you have seizures, you become unconscious, or you are unable to wake up. HOW CAN I PREVENT A HEAD INJURY IN THE FUTURE? The most important factor for preventing major head injuries is avoiding motor vehicle accidents. To minimize the potential for damage to your head, it is crucial to wear seat belts while riding in motor vehicles. Wearing helmets while bike riding and playing collision sports (like football) is also helpful. Also, avoiding dangerous activities around the house will further help reduce your risk of head injury.  WHEN CAN I RETURN TO NORMAL ACTIVITIES AND ATHLETICS? You should be reevaluated by your health care provider before returning to these activities. If you have any of the following symptoms, you should not return to activities or contact sports until 1 week after the symptoms have stopped:  Persistent headache.  Dizziness or vertigo.  Poor attention and concentration.  Confusion.  Memory problems.  Nausea or vomiting.  Fatigue or tire easily.  Irritability.  Intolerant of bright lights or loud noises.  Anxiety or depression.  Disturbed sleep. MAKE SURE YOU:   Understand these instructions.  Will watch your condition.  Will get help right away if you are not doing well or get worse. Document Released: 09/22/2005 Document Revised: 09/27/2013 Document Reviewed: 05/30/2013 [facility] Patient Information 2015 [place], [place]. This information is not intended to replace advice given to you by your health care provider. Make sure you discuss any questions you have with your health care provider.  Knee Effusion The medical term for having fluid in your knee is effusion. This  is often due to an internal derangement of the knee. This means something is wrong inside the knee. Some of the causes  of fluid in the knee may be torn cartilage, a torn ligament, or bleeding into the joint from an injury. Your knee is likely more difficult to bend and move. This is often because there is increased pain and pressure in the joint. The time it takes for recovery from a knee effusion depends on different factors, including:   Type of injury.  Your age.  Physical and medical conditions.  Rehabilitation Strategies. How long you will be away from your normal activities will depend on what kind of knee problem you have and how much damage is present. Your knee has two types of cartilage. Articular cartilage covers the bone ends and lets your knee bend and move smoothly. Two menisci, thick pads of cartilage that form a rim inside the joint, help absorb shock and stabilize your knee. Ligaments bind the bones together and support your knee joint. Muscles move the joint, help support your knee, and take stress off the joint itself. CAUSES  Often an effusion in the knee is caused by an injury to one of the menisci. This is often a tear in the cartilage. Recovery after a meniscus injury depends on how much meniscus is damaged and whether you have damaged other knee tissue. Small tears may heal on their own with conservative treatment. Conservative means rest, limited weight bearing activity and muscle strengthening exercises. Your recovery may take up to 6 weeks.  TREATMENT  Larger tears may require surgery. Meniscus injuries may be treated during arthroscopy. Arthroscopy is a procedure in which your surgeon uses a small telescope like instrument to look in your knee. Your caregiver can make a more accurate diagnosis (learning what is wrong) by performing an arthroscopic procedure. If your injury is on the inner margin of the meniscus, your surgeon may trim the meniscus back to a smooth rim. In  other cases your surgeon will try to repair a damaged meniscus with stitches (sutures). This may make rehabilitation take longer, but may provide better long term result by helping your knee keep its shock absorption capabilities. Ligaments which are completely torn usually require surgery for repair. HOME CARE INSTRUCTIONS  Use crutches as instructed.  If a brace is applied, use as directed.  Once you are home, an ice pack applied to your swollen knee may help with discomfort and help decrease swelling.  Keep your knee raised (elevated) when you are not up and around or on crutches.  Only take over-the-counter or prescription medicines for pain, discomfort, or fever as directed by your caregiver.  Your caregivers will help with instructions for rehabilitation of your knee. This often includes strengthening exercises.  You may resume a normal diet and activities as directed. SEEK MEDICAL CARE IF:   There is increased swelling in your knee.  You notice redness, swelling, or increasing pain in your knee.  An unexplained oral temperature above 102 F (38.9 C) develops. SEEK IMMEDIATE MEDICAL CARE IF:   You develop a rash.  You have difficulty breathing.  You have any allergic reactions from medications you may have been given.  There is severe pain with any motion of the knee. MAKE SURE YOU:   Understand these instructions.  Will watch your condition.  Will get help right away if you are not doing well or get worse. Document Released: 12/13/2003 Document Revised: 12/15/2011 Document Reviewed: 02/16/2008 [facility] Patient Information 2015 [place], [place]. This information is not intended to replace advice given to you by your health care provider. Make sure you discuss any  questions you have with your health care provider.

## 2015-04-23 NOTE — Telephone Encounter (Signed)
Pt called, LMOM, states he had a fall in the yard yesterday, tripped on last step going down.  Landed face first in the grass, but did hurt his L knee, it is swollen/bruised.  Has been icing for 20 min on/ 20 min off since last night.   Returned call, LMOM.  Advised that if swelling is painful or movement issues he will need to see his PCP or orthopedist.

## 2015-04-23 NOTE — ED Notes (Signed)
Pt reports he sustained a fall yest at home going outside Landed on grass... inj left knee... Swelling and painful Brought back in wheelchair Denies LOC but has a small abrasion to forehead... Taking coumadin  Alert, no signs of acuate distress.

## 2015-05-30 ENCOUNTER — Ambulatory Visit (INDEPENDENT_AMBULATORY_CARE_PROVIDER_SITE_OTHER): Payer: Medicare Other | Admitting: Pharmacist Clinician (PhC)/ Clinical Pharmacy Specialist

## 2015-05-30 DIAGNOSIS — I4891 Unspecified atrial fibrillation: Secondary | ICD-10-CM

## 2015-05-30 DIAGNOSIS — Z7901 Long term (current) use of anticoagulants: Secondary | ICD-10-CM

## 2015-05-30 LAB — POCT INR: INR: 4

## 2015-06-18 NOTE — Progress Notes (Signed)
No show

## 2015-06-19 ENCOUNTER — Ambulatory Visit (INDEPENDENT_AMBULATORY_CARE_PROVIDER_SITE_OTHER): Payer: Medicare Other | Admitting: Neurology

## 2015-06-19 ENCOUNTER — Encounter: Payer: Self-pay | Admitting: Neurology

## 2015-06-19 ENCOUNTER — Ambulatory Visit (INDEPENDENT_AMBULATORY_CARE_PROVIDER_SITE_OTHER): Payer: Self-pay | Admitting: Cardiovascular Disease

## 2015-06-19 VITALS — BP 162/81 | HR 76 | Ht 71.0 in | Wt 243.4 lb

## 2015-06-19 DIAGNOSIS — E531 Pyridoxine deficiency: Secondary | ICD-10-CM | POA: Diagnosis not present

## 2015-06-19 DIAGNOSIS — I482 Chronic atrial fibrillation: Secondary | ICD-10-CM

## 2015-06-19 DIAGNOSIS — E5111 Dry beriberi: Secondary | ICD-10-CM | POA: Diagnosis not present

## 2015-06-19 DIAGNOSIS — Z7901 Long term (current) use of anticoagulants: Secondary | ICD-10-CM

## 2015-06-19 DIAGNOSIS — G5601 Carpal tunnel syndrome, right upper limb: Secondary | ICD-10-CM | POA: Diagnosis not present

## 2015-06-19 DIAGNOSIS — R251 Tremor, unspecified: Secondary | ICD-10-CM | POA: Diagnosis not present

## 2015-06-19 DIAGNOSIS — G5602 Carpal tunnel syndrome, left upper limb: Secondary | ICD-10-CM

## 2015-06-19 DIAGNOSIS — I10 Essential (primary) hypertension: Secondary | ICD-10-CM

## 2015-06-19 DIAGNOSIS — G609 Hereditary and idiopathic neuropathy, unspecified: Secondary | ICD-10-CM | POA: Diagnosis not present

## 2015-06-19 DIAGNOSIS — I25700 Atherosclerosis of coronary artery bypass graft(s), unspecified, with unstable angina pectoris: Secondary | ICD-10-CM

## 2015-06-19 DIAGNOSIS — G5603 Carpal tunnel syndrome, bilateral upper limbs: Secondary | ICD-10-CM

## 2015-06-19 DIAGNOSIS — E785 Hyperlipidemia, unspecified: Secondary | ICD-10-CM

## 2015-06-19 DIAGNOSIS — E538 Deficiency of other specified B group vitamins: Secondary | ICD-10-CM | POA: Diagnosis not present

## 2015-06-19 MED ORDER — GABAPENTIN 300 MG PO CAPS: 300.0000 mg | ORAL_CAPSULE | Freq: Three times a day (TID) | ORAL | Status: DC

## 2015-06-19 NOTE — Patient Instructions (Signed)
Overall you are doing fairly well but I do want to suggest a few things today:   Remember to drink plenty of fluid, eat healthy meals and do not skip any meals. Try to eat protein with a every meal and eat a healthy snack such as fruit or nuts in between meals. Try to keep a regular sleep-wake schedule and try to exercise daily, particularly in the form of walking, 20-30 minutes a day, if you can.   As far as your medications are concerned, I would like to suggest: Neurontin as prescribed  As far as diagnostic testing: labs, EMG/NCS  I would like to see you back in a few weeks for emg/ncs, sooner if we need to. Please call us with any interim questions, concerns, problems, updates or refill requests.   Please also call us for any test results so we can go over those with you on the phone.  My clinical assistant and will answer any of your questions and relay your messages to me and also relay most of my messages to you.   Our phone number is [phone]. We also have an after hours call service for urgent matters and there is a physician on-call for urgent questions. For any emergencies you know to call 911 or go to the nearest emergency room

## 2015-06-19 NOTE — Progress Notes (Signed)
[facility]    Provider:  Dr Jaynee Eagles Referring Provider: [name], MD Primary Care Physician:  Criselda Peaches, MD  CC:  Neuropathy and tremor  HPI:  Mike Price is a 80 y.o. male here as a referral from Dr. Nyoka Cowden for neuropathy and tremor. PMH CABG and HLD, balance problems, generalized weakness.  In 2001 he had 2x CABG completed. That's when the neuropathy in the feet started, like they were shrink wrapped. He later discovered he had thyroid problems. He had shooting pain in the feet and burning. More recently he has noticed his hands shaking, dropping objects out of his hands, shaking. He had an emg/ncs 10 years ago here in the office and he was diagnosed with neuropathy, doesn't want that again. His feet feel like wooden, numb now. The burning and shooting pains have stopped. His balance is worsening. He missed a step and fell and now he is scared and says this contributes to his balance issues. He uses a walker. He denies low back pain and he has a replaced left hip and a replaced right knee. His left knee needs replacement as well. He stumbles over his feet, he can't feel his feet. He still drives and he can feel the pedals. Symptoms in the hands started a year ago. His hands feel clumsy. Tremor started a year ago, especially when he is writing. Notices it more with action. Difficulty holding a coffee cup. 2-3 drinks martinis a day and in the past has drunk more but infrequently more than 2-3 a day. Dad may have had a tremor. Grandfather with lupus. He has neck pain which is mild. He was treated in the past for TB many years ago but doesn't remember what medication. He took mefloquin in the past for malaria. He denies many of the medications that can induce polyneuropathy such as certain antibiotics, chemotherapeutic agents, cardiovascular drugs, rheumatologic drugs, and other miscellaneous substances and toxins.  Reviewed notes, labs and imaging from outside  physicians, which showed:   CT HEAD WITHOUT CONTRAST, personally reviewed images and agree with findings  Findings: Mild atrophy. Mild chronic microvascular ischemic change in the white matter. Negative for acute infarct. Negative for hemorrhage or mass. No midline shift. Negative for skull fracture.  IMPRESSION: Mild chronic microvascular ischemia. No acute abnormality.  Review of Systems: Patient complains of symptoms per HPI as well as the following symptoms: Swelling in legs, anemia, easy bruising, easy bleeding, feeling cold, flushing, joint pain, joint swelling, cramps, constipation, moles, impotence, runny nose, numbness, weakness, tremor, anxiety, decreased energy. Pertinent negatives per HPI. All others negative.   Social History   Social History  . Marital Status: Married    Spouse Name: Mike Price  . Number of Children: N/A  . Years of Education: N/A   Occupational History  . Priest     Social History Main Topics  . Smoking status: Former Smoker    Quit date: 01/12/1984  . Smokeless tobacco: Not on file  . Alcohol Use: 1.8 oz/week    3 Shots of liquor per week     Comment:  3 ounces daily  . Drug Use: No  . Sexual Activity: Yes   Other Topics Concern  . Not on file   Social History Narrative   Lives at home with wife, Mike Price   Caffeine use: 2 cups coffee per day       Family History  Problem Relation Age of Onset  . Stroke      Grandfather  Past Medical History  Diagnosis Date  . Hypertension   . Thyroid disease   . TMJ tenderness 01-11-13    right side-has issues from time to time.  . Pacemaker 12/15/06    3'08  . Dysrhythmia 01-11-13     hx. A. Fib-Pacemaker implanted left chest  . Hypothyroidism   . Anxiety 01-11-13    tx. Clonazepam.  . Edema extremities 01-11-13    retains fluid in legs-right greater than left.  . Neuromuscular disorder     neuropathy in feet  . GERD (gastroesophageal reflux disease)   . Arthritis      Osteoarthritis-knees, fingers  . Anemia   . CAD (coronary artery disease)   . Hyperlipemia   . Obstructive sleep apnea     on C Pap    Past Surgical History  Procedure Laterality Date  . Coronary artery bypass graft  2001    4 vessels-'00  . Tonsillectomy      child  . Appendectomy    . Insert / replace / remove pacemaker      '08-inserted left chest  . Cervical laminectomy    . Hernia repair    . Joint replacement      LTHA  . Total knee arthroplasty Right 01/17/2013    Procedure: RIGHT TOTAL KNEE ARTHROPLASTY;  Surgeon: Gearlean Alf, MD;  Location: [facility];  Service: Orthopedics;  Laterality: Right;  . Cardiac catheterization  03/28/08    patent grafts, nl EF  . Pacemaker insertion  12/15/06    medtronic  . US echocardiography  12/28/2007    LA mod. dilated,RA mildly dilated  . Nm myocar perf wall motion  02/12/2012    small fixed distal anteroapical/apical defect. No reversible ischemia    Current Outpatient Prescriptions  Medication Sig Dispense Refill  . acetaminophen (TYLENOL) 325 MG tablet Take 2 tablets (650 mg total) by mouth every 6 (six) hours as needed. 60 tablet 0  . aspirin 81 MG tablet Take 81 mg by mouth daily.    [name] Kitchen atorvastatin (LIPITOR) 40 MG tablet Take 40 mg by mouth every evening.     . bisacodyl (DULCOLAX) 10 MG suppository Place 1 suppository (10 mg total) rectally daily as needed. 12 suppository 0  . clonazePAM (KLONOPIN) 0.5 MG tablet Take 0.25-0.5 mg by mouth 3 (three) times daily as needed. Takes 1/2 tablet twice daily then a whole tablet at bedtime    . doxazosin (CARDURA) 2 MG tablet Take 1 mg by mouth at bedtime.    [name] Kitchen ezetimibe (ZETIA) 10 MG tablet Take 10 mg by mouth every evening.     . ferrous sulfate 325 (65 FE) MG tablet Take 325 mg by mouth every other day. On Monday, Wednesday and Friday    . furosemide (LASIX) 20 MG tablet Take 10 mg by mouth daily.     [name] Kitchen losartan (COZAAR) 100 MG tablet Take 100 mg by mouth daily.    . metoprolol  (LOPRESSOR) 100 MG tablet Take 100 mg by mouth 2 (two) times daily.    . NON FORMULARY CPAP qhs    . omeprazole (PRILOSEC) 20 MG capsule Take 20 mg by mouth 2 (two) times daily.    [name] Kitchen SYNTHROID 88 MCG tablet Take 88 mcg by mouth daily.    . traMADol (ULTRAM) 50 MG tablet Take 50 mg by mouth every 6 (six) hours as needed for pain.    [name] Kitchen warfarin (COUMADIN) 5 MG tablet Take 1 to 1.5 tablets by mouth daily as  directed by coumadin clinic 120 tablet 1  . gabapentin (NEURONTIN) 300 MG capsule Take 1 capsule (300 mg total) by mouth 3 (three) times daily. 90 capsule 11   No current facility-administered medications for this visit.    Allergies as of 06/19/2015 - Review Complete 06/19/2015  Allergen Reaction Noted  . Lisinopril Cough 01/11/2013  . Mefloquine Other (See Comments) 01/11/2013    Vitals: BP 162/81 mmHg  Pulse 76  Ht 5\' 11"  (1.803 m)  Wt 243 lb 6.4 oz (110.406 kg)  BMI 33.96 kg/m2 Last Weight:  Wt Readings from Last 1 Encounters:  06/19/15 243 lb 6.4 oz (110.406 kg)   Last Height:   Ht Readings from Last 1 Encounters:  06/19/15 5\' 11"  (1.803 m)    Physical exam: Exam: Gen: NAD, conversant, well nourised, obese, well groomed                     CV: RRR, no MRG. No Carotid Bruits. Mild peripheral edema, warm, nontender Eyes: Conjunctivae clear without exudates or hemorrhage  Neuro: Detailed Neurologic Exam  Speech:    Speech is normal; fluent and spontaneous with normal comprehension.  Cognition:    The patient is oriented to person, place, and time;     recent and remote memory intact;     language fluent;     normal attention, concentration,     fund of knowledge Cranial Nerves:    The pupils are equal, round, and reactive to light. The fundi are flat. Visual fields are full to finger confrontation. Extraocular movements are intact. Trigeminal sensation is intact and the muscles of mastication are normal. The face is symmetric. The palate elevates in the midline.  Hearing intact. Voice is normal. Shoulder shrug is normal. The tongue has normal motion without fasciculations.   Coordination:    Normal finger to nose.  Gait: Sensory ataxia  Motor Observation: Postural high frequency low amplitude tremor with kinetic component  Tone:    Normal muscle tone.    Posture:    Posture is normal. normal erect    Strength: Right delt weakness due to rotator cuff problems that are chronic otherwise  5/5 strength     Sensation:  vibartion absent at great toe but intact medial mall Decreased pp and temp to the knees and mid forearm  Slightly impaired proprioception     Reflex Exam:  DTR's: 1+ uppers.    Deep tendon reflexes in the lower extremities are absent Toes:    The toes are downgoing bilaterally.   Clonus:    Clonus is absent.   Assessment/Plan:    [name] is a 80 y.o. male here as a referral from Dr. Nyoka Cowden for neuropathy and tremor. PMH CABG and HLD, balance problems, generalized weakness. He has quite a significant peripheral neuropathy with sensory ataxia. His strength is actually quite good. Symptoms started over 15 years ago and have progressed slowly. More recently patient's hands are involved. Had a long discussion with patient and his wife regarding length dependent peripheral neuropathy, there are many different causes. Patient has a history of several martinis a day and maybe this has some contribution but no other significant risk factors. We'll do a thorough serum evaluation and also perform EMG nerve conduction study to evaluate symptoms in his upper hands so we don't miss something we can possibly fix such as carpal tunnel syndrome but I do suspect its due to progression of his neuropathy.    Sarina Ill, MD  [facility] [address] [place], [place] [address]  Phone [phone] Fax [phone]  A total of 90 minutes was spent face-to-face with this patient. Over half this time was  spent on counseling patient on the peripheral neuropathy diagnosis and different diagnostic and therapeutic options available.

## 2015-06-20 ENCOUNTER — Ambulatory Visit (INDEPENDENT_AMBULATORY_CARE_PROVIDER_SITE_OTHER): Payer: Medicare Other | Admitting: Pharmacist Clinician (PhC)/ Clinical Pharmacy Specialist

## 2015-06-20 ENCOUNTER — Encounter: Payer: Self-pay | Admitting: Neurology

## 2015-06-20 ENCOUNTER — Encounter: Payer: Self-pay | Admitting: Cardiovascular Disease

## 2015-06-20 DIAGNOSIS — Z7901 Long term (current) use of anticoagulants: Secondary | ICD-10-CM

## 2015-06-20 DIAGNOSIS — I4891 Unspecified atrial fibrillation: Secondary | ICD-10-CM | POA: Diagnosis not present

## 2015-06-20 LAB — POCT INR: INR: 3.5

## 2015-06-25 LAB — B12 AND FOLATE PANEL
Folate: >20 ng/mL (ref >3.0)
Vitamin B-12: 392 pg/mL (ref 211–946)

## 2015-06-25 LAB — COMPREHENSIVE METABOLIC PANEL
ALK PHOS: 109 IU/L (ref 39–117)
ALT: 19 IU/L (ref 0–44)
AST: 25 IU/L (ref 0–40)
Albumin/Globulin Ratio: 1.4 (ref 1.1–2.5)
Albumin: 4.2 g/dL (ref 3.5–4.8)
BILIRUBIN TOTAL: 1.3 mg/dL — AB (ref 0.0–1.2)
BUN/Creatinine Ratio: 21 (ref 10–22)
BUN: 18 mg/dL (ref 8–27)
CHLORIDE: 96 mmol/L — AB (ref 97–108)
CO2: 27 mmol/L (ref 18–29)
Calcium: 9.4 mg/dL (ref 8.6–10.2)
Creatinine, Ser: 0.87 mg/dL (ref 0.76–1.27)
GFR calc Af Amer: 96 mL/min/{1.73_m2} (ref >59)
GFR calc non Af Amer: 83 mL/min/{1.73_m2} (ref >59)
GLUCOSE: 148 mg/dL — AB (ref 65–99)
Globulin, Total: 3 g/dL (ref 1.5–4.5)
Potassium: 4.2 mmol/L (ref 3.5–5.2)
Sodium: 139 mmol/L (ref 134–144)
Total Protein: 7.2 g/dL (ref 6.0–8.5)

## 2015-06-25 LAB — MULTIPLE MYELOMA PANEL, SERUM
ALBUMIN SERPL ELPH-MCNC: 3.8 g/dL (ref 2.9–4.4)
Albumin/Glob SerPl: 1.2 (ref 0.7–1.7)
Alpha 1: 0.3 g/dL (ref 0.0–0.4)
Alpha2 Glob SerPl Elph-Mcnc: 0.8 g/dL (ref 0.4–1.0)
B-GLOBULIN SERPL ELPH-MCNC: 1.1 g/dL (ref 0.7–1.3)
Gamma Glob SerPl Elph-Mcnc: 1.2 g/dL (ref 0.4–1.8)
Globulin, Total: 3.4 g/dL (ref 2.2–3.9)
IGM (IMMUNOGLOBULIN M), SRM: 49 mg/dL (ref 15–143)
IgA/Immunoglobulin A, Serum: 542 mg/dL — ABNORMAL HIGH (ref 61–437)
IgG (Immunoglobin G), Serum: 926 mg/dL (ref 700–1600)
M Protein SerPl Elph-Mcnc: 0.2 g/dL — ABNORMAL HIGH

## 2015-06-25 LAB — PAN-ANCA
Myeloperoxidase Ab: <9 U/mL (ref 0.0–9.0)
P-ANCA: <1:20 {titer}

## 2015-06-25 LAB — ANA COMPREHENSIVE PANEL
Anti JO-1: <0.2 AI (ref 0.0–0.9)
ENA RNP AB: 0.4 AI (ref 0.0–0.9)
ENA SSA (RO) Ab: <0.2 AI (ref 0.0–0.9)
ENA SSB (LA) Ab: <0.2 AI (ref 0.0–0.9)
Scleroderma SCL-70: <0.2 AI (ref 0.0–0.9)
dsDNA Ab: <1 IU/mL (ref 0–9)

## 2015-06-25 LAB — RHEUMATOID FACTOR

## 2015-06-25 LAB — TISSUE TRANSGLUTAMINASE, IGA

## 2015-06-25 LAB — RPR: RPR Ser Ql: NONREACTIVE

## 2015-06-25 LAB — VITAMIN B1: THIAMINE: 174 nmol/L (ref 66.5–200.0)

## 2015-06-25 LAB — HEMOGLOBIN A1C
ESTIMATED AVERAGE GLUCOSE: 123 mg/dL
Hgb A1c MFr Bld: 5.9 % — ABNORMAL HIGH (ref 4.8–5.6)

## 2015-06-25 LAB — ANTINUCLEAR ANTIBODIES, IFA: ANA TITER 1: NEGATIVE

## 2015-06-25 LAB — SEDIMENTATION RATE: SED RATE: 25 mm/h (ref 0–30)

## 2015-06-25 LAB — ANGIOTENSIN CONVERTING ENZYME: Angio Convert Enzyme: 63 U/L (ref 14–82)

## 2015-06-25 LAB — METHYLMALONIC ACID, SERUM: METHYLMALONIC ACID: 361 nmol/L (ref 0–378)

## 2015-06-25 LAB — HEPATITIS C ANTIBODY

## 2015-06-25 LAB — ANTI-MYELIN ASSOC. GLY. IGM

## 2015-06-25 LAB — GLIADIN ANTIBODIES, SERUM
Antigliadin Abs, IgA: 10 units (ref 0–19)
Gliadin IgG: 5 units (ref 0–19)

## 2015-06-25 LAB — HEAVY METALS, BLOOD
ARSENIC: 7 ug/L (ref 2–23)
Lead, Blood: 2 ug/dL (ref 0–19)
Mercury: 10.7 ug/L (ref 0.0–14.9)

## 2015-06-25 LAB — B. BURGDORFI ANTIBODIES

## 2015-06-25 LAB — VITAMIN B6: Vitamin B6: 34.8 ug/L (ref 5.3–46.7)

## 2015-06-26 ENCOUNTER — Telehealth: Payer: Self-pay | Admitting: Neurology

## 2015-06-26 DIAGNOSIS — D472 Monoclonal gammopathy: Secondary | ICD-10-CM

## 2015-06-26 NOTE — Telephone Encounter (Signed)
  Spoke to patient. 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 I am going to refer him to hematology for evaluation.   Andee Poles - can you please refer to Dr. Beryle Beams? Willyou please let us know when the referral is completed so I can call Dr. Azucena Freed office? thanks

## 2015-06-26 NOTE — Telephone Encounter (Signed)
Thank you. Terrence Dupont can you follow this up in the next few days and make sure he is called for appointment? If Dr. Beryle Beams is wonderful but if he is too backed up for next avail appt, we can request a different hematologist as they are all great. Thanks  Thank you Andee Poles!

## 2015-06-26 NOTE — Telephone Encounter (Signed)
Referral has been sent to Dr. Beryle Beams at [facility].

## 2015-06-28 NOTE — Telephone Encounter (Signed)
Checked status of referral. Referral resent to correct fax number.

## 2015-06-28 NOTE — Telephone Encounter (Signed)
Thank you! Please get the referral sent over and get him scheduled. Andee Poles can you get the referral over? Dr. Darnell Level has access to [facility] so no notes need to be sent thanks.

## 2015-06-28 NOTE — Telephone Encounter (Signed)
Spoke with Juliann Pulse at [facility] to check on status of referral and to see when Dr. Beryle Beams has next available appointment. 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 She said to call: Phone: [phone] to find out how booked he is. She did not know his fax number to his office.  Refax to: Susette Racer Orthoptist) [phone] Fax: [phone]  Can see him beginning of next month. Needs referral by today to get him scheduled or else he cannot be seen until October 24th or later because Dr. Beryle Beams is out of the office until then. I advised I will get everything together to be sent and have referral coordinator send it over. She verbalized understanding.

## 2015-06-29 NOTE — Telephone Encounter (Signed)
I spoke with Mike Price and she did receive faxed referral. She has called pt and is awaiting patient call back.

## 2015-07-04 ENCOUNTER — Ambulatory Visit (INDEPENDENT_AMBULATORY_CARE_PROVIDER_SITE_OTHER): Payer: Medicare Other | Admitting: Pharmacist Clinician (PhC)/ Clinical Pharmacy Specialist

## 2015-07-04 DIAGNOSIS — Z7901 Long term (current) use of anticoagulants: Secondary | ICD-10-CM

## 2015-07-04 DIAGNOSIS — I4891 Unspecified atrial fibrillation: Secondary | ICD-10-CM

## 2015-07-04 LAB — POCT INR: INR: 1.3

## 2015-07-09 ENCOUNTER — Encounter: Payer: Medicare Other | Admitting: Neurology

## 2015-07-10 ENCOUNTER — Encounter: Payer: Self-pay | Admitting: Oncology

## 2015-07-10 ENCOUNTER — Ambulatory Visit (INDEPENDENT_AMBULATORY_CARE_PROVIDER_SITE_OTHER): Payer: Medicare Other | Admitting: Oncology

## 2015-07-10 VITALS — BP 160/73 | HR 65 | Temp 98.5°F | Ht 71.0 in | Wt 252.5 lb

## 2015-07-10 DIAGNOSIS — D472 Monoclonal gammopathy: Secondary | ICD-10-CM | POA: Diagnosis present

## 2015-07-10 HISTORY — DX: Monoclonal gammopathy: D47.2

## 2015-07-10 NOTE — Patient Instructions (Signed)
To lab today Give patient jug for 24 hour urine collection for total protein and IFE Return visit 4-6 weeks

## 2015-07-10 NOTE — Progress Notes (Signed)
Patient ID: Mike Price, male   DOB: January 19, 1935, 80 y.o.   MRN: [id number] New Patient Hematology   Mike Price [id number] 1935-10-01 80 y.o. 07/10/2015  CC:Dr. Sarina Ill, Dr. 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 Over the last year, he has developed progressive pain in his feet and now both of his hands. He has also had a resting tremor.he is still able to drive a car. He can feel his feet on the gas pedal. However his ability to ambulate is becoming increasingly compromised.this is further complicated  by severe degenerative arthritis now status post left hip and right knee replacement. Paresthesias of his hands are causing him to drop things. He had a complete neurological exam by Dr. Royetta Car 06/19/2015. Pertinent findings were: sensory ataxia,decreased pinprick and temperature sensation in a glove and stocking distribution. Absent vibration at the great toe. Slightly impaired proprioception. An extensive laboratory panel was done. The only positive finding was mild elevation of total serum IgA at 542 mg percent (61-437) with normal IgG, 926 mg percent, and IgM, 49 mg percent. No M spike on serum protein electrophoresis but a monoclonal IgA lambda paraprotein seen on immunofixation electrophoresis. Chemistry profile showed normal renal function with BUN 18 and creatinine 0.9. A CBC was not done. Most recent hemoglobin on record is from April 2014 and it was decreased at 11.4. I suspect  this was done at time of joint replacement surgery since a hemoglobin done just 9 days prior to that was 14.1. Negative testing included: ANA, rheumatoid factor, RPR, hepatitis C [id number], folic acid, D6,U4,QIHKVQQV, negative tissue transglutaminase and gliotic antibody serology, normal ACE level, negative Lyme titer, anti-myelin associatedIgM  glycoprotein, serum heavy metal screen. There is no family history of any blood disorder.   PMH: Past Medical History  Diagnosis Date  . Hypertension   . Thyroid disease: Hypothyroidism   . TMJ tenderness 01-11-13    right side-has issues from time to time.  . Pacemaker 12/15/06    3'08  . Dysrhythmia 01-11-13     hx. A. Fib-Pacemaker implanted left chest  . Hypothyroidism   . Anxiety 01-11-13    tx. Clonazepam.  . Edema extremities 01-11-13    retains fluid in legs-right greater than left.  . Neuromuscular disorder ([place])     neuropathy in feet  . GERD (gastroesophageal reflux disease)   . Arthritis     Osteoarthritis-knees, fingers  . Anemia   . CAD (coronary artery disease)   . Hyperlipemia   . Obstructive sleep apnea     on C Pap  Coronary bypass surgery 2001. Atrial fibrillation. Symptomatic bradycardia requiring pacemaker implant in 2008. (Suspect bradycardia tachycardia syndrome),positive PPD treated with 9 months of treatment (question INH), osteoarthritis status post left hip and right knee replacements. He denies history of MI, ulcers, emphysema,hepatitis, yellow jaundice, malaria, seizure, stroke.  Past Surgical History  Procedure Laterality Date  . Coronary artery bypass graft  2001    4 vessels-'00  . Tonsillectomy      child  . Appendectomy    . Insert / replace / remove pacemaker      '08-inserted left chest  . Cervical laminectomy    . Hernia repair    . Joint replacement      LTHA  . Total knee arthroplasty Right 01/17/2013    Procedure: RIGHT TOTAL KNEE ARTHROPLASTY;  Surgeon: Gearlean Alf, MD;  Location: [facility];  Service: Orthopedics;  Laterality: Right;  . Cardiac catheterization  03/28/08    patent grafts, nl EF  . Pacemaker insertion  12/15/06    medtronic  . US echocardiography  12/28/2007    LA mod. dilated,RA mildly dilated  . Nm myocar perf wall motion  02/12/2012    small fixed distal anteroapical/apical defect. No reversible ischemia     Allergies: Allergies  Allergen Reactions  . Lisinopril Cough  . Mefloquine Other (See Comments)    "made me crazy" anxious, upset    Medications:  Current outpatient prescriptions:  .  acetaminophen (TYLENOL) 325 MG tablet, Take 2 tablets (650 mg total) by mouth every 6 (six) hours as needed., Disp: 60 tablet, Rfl: 0 .  aspirin 81 MG tablet, Take 81 mg by mouth daily., Disp: , Rfl:  .  atorvastatin (LIPITOR) 40 MG tablet, Take 40 mg by mouth every evening. , Disp: , Rfl:  .  bisacodyl (DULCOLAX) 10 MG suppository, Place 1 suppository (10 mg total) rectally daily as needed., Disp: 12 suppository, Rfl: 0 .  clonazePAM (KLONOPIN) 0.5 MG tablet, Take 0.25-0.5 mg by mouth 3 (three) times daily as needed. Takes 1/2 tablet twice daily then a whole tablet at bedtime, Disp: , Rfl:  .  doxazosin (CARDURA) 2 MG tablet, Take 1 mg by mouth at bedtime., Disp: , Rfl:  .  ezetimibe (ZETIA) 10 MG tablet, Take 10 mg by mouth every evening. , Disp: , Rfl:  .  ferrous sulfate 325 (65 FE) MG tablet, Take 325 mg by mouth every other day. On Monday, Wednesday and Friday, Disp: , Rfl:  .  furosemide (LASIX) 20 MG tablet, Take 10 mg by mouth daily. , Disp: , Rfl:  .  gabapentin (NEURONTIN) 300 MG capsule, Take 1 capsule (300 mg total) by mouth 3 (three) times daily., Disp: 90 capsule, Rfl: 11 .  losartan (COZAAR) 100 MG tablet, Take 100 mg by mouth daily., Disp: , Rfl:  .  metoprolol (LOPRESSOR) 100 MG tablet, Take 100 mg by mouth 2 (two) times daily., Disp: , Rfl:  .  NON FORMULARY, CPAP qhs, Disp: , Rfl:  .  omeprazole (PRILOSEC) 20 MG capsule, Take 20 mg by mouth 2 (two) times daily., Disp: , Rfl:  .  SYNTHROID 88 MCG tablet, Take 88 mcg by mouth daily., Disp: , Rfl:  .  traMADol (ULTRAM) 50 MG tablet, Take 50 mg by mouth every 6 (six) hours as needed for pain., Disp: , Rfl:  .  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 Healthy daughter aged 57. 2 stepchildren from his current wife.  reports that he quit smoking about 31 years ago.  He reports that he drinks about 1.8 oz of alcohol per week.(2-3 vodka martinis daily ,he does not use illicit drugs.  Family History: Family History  Problem Relation Age of Onset  . Stroke      Grandfather   . Neuropathy Neg Hx   mother died of an MI at age 37. Father lived until 35. He has one brother age 37 who is also a priest working in [place].  Review of Systems: See HPI Remaining ROS negative.  Physical Exam: Blood pressure 160/73, pulse 65, temperature 98.5 F (36.9 C), temperature source Oral, height 5\' 11"  (1.803 m), weight 252  lb 8 oz (114.533 kg), SpO2 98 %. Wt Readings from Last 3 Encounters:  07/10/15 252 lb 8 oz (114.533 kg)  06/19/15 243 lb 6.4 oz (110.406 kg)  11/07/14 251 lb 14.4 oz (114.261 kg)     General appearance: well-nourished Caucasian man HENNT: Pharynx no erythema, exudate, mass, or ulcer. No thyromegaly or thyroid nodules Lymph nodes: No cervical, supraclavicular, or axillary lymphadenopathy Breasts:  Lungs: Clear to auscultation, resonant to percussion throughout Heart: Regular rhythm, no murmur, no gallop, no rub, no click, no edema Abdomen: Soft, nontender, normal bowel sounds, no mass,liver palpable 6 cm below right costal margin. No splenomegaly. Extremities: brawny ankle edema, advanced venous stasis changes on the right,elastic stocking on the left,no calf tenderness Musculoskeletal: scar over right knee status post knee replacement GU:  Vascular: Carotid pulses 2+, no bruits,  Neurologic: Alert, oriented, PERRLA, optic discs sharp and vessels normal, no hemorrhage or exudate, cranial nerves grossly normal, motor strength 5 over 5, reflexes 1+ symmetric biceps, absent symmetric knees,, upper body coordination normal, gait unsteady secondary to arthritis and  neuropathy. Severe decrease in vibration sensation over the fingers of both hands. Skin: palmar erythema. No Dupuytren's contractures. No thickening of the flexor tendons of the wrist.No rash or ecchymosis    Lab Results: Lab Results  Component Value Date   WBC 13.9* 01/20/2013   HGB 11.4* 01/20/2013   HCT 33.1* 01/20/2013   MCV 102.8* 01/20/2013   PLT 151 01/20/2013     Chemistry      Component Value Date/Time   NA 139 06/19/2015 1637   NA 136 01/19/2013 0435   K 4.2 06/19/2015 1637   CL 96* 06/19/2015 1637   CO2 27 06/19/2015 1637   BUN 18 06/19/2015 1637   BUN 15 01/19/2013 0435   CREATININE 0.87 06/19/2015 1637      Component Value Date/Time   CALCIUM 9.4 06/19/2015 1637   ALKPHOS 109 06/19/2015 1637   AST 25 06/19/2015 1637   ALT 19 06/19/2015 1637   BILITOT 1.3* 06/19/2015 1637   BILITOT 1.3* 01/11/2013 1240         Impression: IgA monoclonal gammopathy of undetermined significance This is almost definitely the benign monoclonal gammopathyof undetermined significance seen in 5-10% of people over the age of 56. He has normal renal function. IgA is only mildly elevated and there is no concomitant suppression of his other immunoglobulins. Serum albumin is normal. Calcium not recorded. I believe his chronic alcohol use is contributing significantly to his neuropathy.  Plan: I will get a current CBC. Check serum free light chains and a 24 hour urine for total protein and immunofixation electrophoresis.if these are unremarkable, no further evaluation indicated other than annual immunoglobulin studies.        Annia Belt, MD 07/10/2015, 6:21 PM

## 2015-07-11 LAB — CBC WITH DIFFERENTIAL/PLATELET
BASOS: 0 %
Basophils Absolute: 0 10*3/uL (ref 0.0–0.2)
EOS (ABSOLUTE): 0.2 10*3/uL (ref 0.0–0.4)
Eos: 2 %
Hematocrit: 39.9 % (ref 37.5–51.0)
Hemoglobin: 13.1 g/dL (ref 12.6–17.7)
IMMATURE GRANS (ABS): 0 10*3/uL (ref 0.0–0.1)
Immature Granulocytes: 0 %
LYMPHS ABS: 1.6 10*3/uL (ref 0.7–3.1)
LYMPHS: 21 %
MCH: 35.9 pg — AB (ref 26.6–33.0)
MCHC: 32.8 g/dL (ref 31.5–35.7)
MCV: 109 fL — AB (ref 79–97)
MONOS ABS: 0.9 10*3/uL (ref 0.1–0.9)
Monocytes: 12 %
NEUTROS ABS: 4.7 10*3/uL (ref 1.4–7.0)
Neutrophils: 65 %
PLATELETS: 152 10*3/uL (ref 150–379)
RBC: 3.65 x10E6/uL — ABNORMAL LOW (ref 4.14–5.80)
RDW: 13.3 % (ref 12.3–15.4)
WBC: 7.4 10*3/uL (ref 3.4–10.8)

## 2015-07-11 LAB — KAPPA/LAMBDA LIGHT CHAINS
Ig Kappa Free Light Chain: 21.99 mg/L — ABNORMAL HIGH (ref 3.30–19.40)
Ig Lambda Free Light Chain: 22.18 mg/L (ref 5.71–26.30)
Kappa/Lambda FluidC Ratio: 0.99 (ref 0.26–1.65)

## 2015-07-18 ENCOUNTER — Ambulatory Visit (INDEPENDENT_AMBULATORY_CARE_PROVIDER_SITE_OTHER): Payer: Medicare Other | Admitting: Pharmacist

## 2015-07-18 DIAGNOSIS — Z7901 Long term (current) use of anticoagulants: Secondary | ICD-10-CM | POA: Diagnosis not present

## 2015-07-18 DIAGNOSIS — I4891 Unspecified atrial fibrillation: Secondary | ICD-10-CM | POA: Diagnosis not present

## 2015-07-18 LAB — POCT INR: INR: 1.6

## 2015-07-19 ENCOUNTER — Ambulatory Visit (INDEPENDENT_AMBULATORY_CARE_PROVIDER_SITE_OTHER): Payer: Medicare Other | Admitting: Neurology

## 2015-07-19 ENCOUNTER — Ambulatory Visit (INDEPENDENT_AMBULATORY_CARE_PROVIDER_SITE_OTHER): Payer: Self-pay | Admitting: Neurology

## 2015-07-19 DIAGNOSIS — Z0289 Encounter for other administrative examinations: Secondary | ICD-10-CM

## 2015-07-19 DIAGNOSIS — G5601 Carpal tunnel syndrome, right upper limb: Secondary | ICD-10-CM

## 2015-07-19 DIAGNOSIS — G609 Hereditary and idiopathic neuropathy, unspecified: Secondary | ICD-10-CM

## 2015-07-19 DIAGNOSIS — G5602 Carpal tunnel syndrome, left upper limb: Secondary | ICD-10-CM | POA: Diagnosis not present

## 2015-07-19 DIAGNOSIS — G5603 Carpal tunnel syndrome, bilateral upper limbs: Secondary | ICD-10-CM

## 2015-07-19 NOTE — Progress Notes (Signed)
  [facility]    Provider:  Dr Jaynee Eagles Referring Provider: [name], MD Primary Care Physician:  Criselda Peaches, MD  History:  Mike Price is a 80 y.o. male here as a referral from Dr. Nyoka Cowden for pain in the hands. Symptoms starts 12-18 months ago. Numbness, tingling mostly in digits 1-4. He is dropping objects and has weak grip. +Phalen's maneuver and +Tinel's sign.    Summary:   Nerve Conduction Studies were performed on the bilateral upper extremities.  The right median APB motor nerve showed prolonged distal onset latency (4.5 ms, N<4.0). The right Median 2nd Digit sensory nerve showed prolonged distal peak latency (4.6 ms, N<3.9). F Wave studies indicate that the right Median F wave has normal latency  The left median APB motor nerve showed prolonged distal onset latency (4.4 ms, N<4.0). The left Median 2nd Digit sensory nerve showed prolonged distal peak latency (4.7 ms, N<3.9).  F Wave studies indicate that the left Median F wave has normal latency.  Bilateral Ulnar ADM motor nerves were within normal limits. F Wave studies indicate that the bilateral Ulnar F waves have normal latencies The bilateralUlnar 5th digit sensory nerves were within normal limits.  EMG needle study of bilateral upper extremity muscles was performed. The following muscles were normal bilaterally: Deltoid, Triceps, Pronator Teres, Opponens Pollicis, First Dorsal Interosseous.  Conclusion: This is an abnormal study. There is electrophysiologic evidence of bilateral moderately-severe Carpal Tunnel Syndrome.   Sarina Ill, MD  [facility] [address] [place], [place] [address]

## 2015-07-19 NOTE — Procedures (Signed)
[facility]    Provider:  Dr Jaynee Eagles Referring Provider: [name], MD Primary Care Physician:  Criselda Peaches, MD  History:  Mike Price is a 80 y.o. male here as a referral from Dr. Nyoka Cowden for pain in the hands. Symptoms starts 12-18 months ago. Numbness, tingling mostly in digits 1-4. He is dropping objects and has weak grip. +Phalen's maneuver and +Tinel's sign.    Summary:   Nerve Conduction Studies were performed on the bilateral upper extremities.  The right median APB motor nerve showed prolonged distal onset latency (4.5 ms, N<4.0). The right Median 2nd Digit sensory nerve showed prolonged distal peak latency (4.6 ms, N<3.9). F Wave studies indicate that the right Median F wave has normal latency  The left median APB motor nerve showed prolonged distal onset latency (4.4 ms, N<4.0). The left Median 2nd Digit sensory nerve showed prolonged distal peak latency (4.7 ms, N<3.9).  F Wave studies indicate that the left Median F wave has normal latency.  Bilateral Ulnar ADM motor nerves were within normal limits. F Wave studies indicate that the bilateral Ulnar F waves have normal latencies The bilateralUlnar 5th digit sensory nerves were within normal limits.  EMG needle study of bilateral upper extremity muscles was performed. The following muscles were normal bilaterally: Deltoid, Triceps, Pronator Teres, Opponens Pollicis, First Dorsal Interosseous.  Conclusion: This is an abnormal study. There is electrophysiologic evidence of bilateral moderately-severe Carpal Tunnel Syndrome.   Sarina Ill, MD  [facility] [address] [place], [place] [address]

## 2015-07-19 NOTE — Progress Notes (Signed)
See procedure note.

## 2015-07-23 ENCOUNTER — Ambulatory Visit (INDEPENDENT_AMBULATORY_CARE_PROVIDER_SITE_OTHER): Payer: Medicare Other | Admitting: Pharmacist Clinician (PhC)/ Clinical Pharmacy Specialist

## 2015-07-23 DIAGNOSIS — I4891 Unspecified atrial fibrillation: Secondary | ICD-10-CM

## 2015-07-23 DIAGNOSIS — Z7901 Long term (current) use of anticoagulants: Secondary | ICD-10-CM

## 2015-07-23 LAB — POCT INR: INR: 1.4

## 2015-07-24 ENCOUNTER — Telehealth: Payer: Self-pay | Admitting: Neurology

## 2015-07-24 ENCOUNTER — Other Ambulatory Visit: Payer: Self-pay | Admitting: Neurology

## 2015-07-24 DIAGNOSIS — G609 Hereditary and idiopathic neuropathy, unspecified: Secondary | ICD-10-CM

## 2015-07-24 DIAGNOSIS — R251 Tremor, unspecified: Secondary | ICD-10-CM

## 2015-07-24 MED ORDER — GABAPENTIN 100 MG PO CAPS: 100.0000 mg | ORAL_CAPSULE | Freq: Three times a day (TID) | ORAL | Status: DC

## 2015-07-24 NOTE — Telephone Encounter (Signed)
Called pt back. Advised per Dr. Jaynee Eagles that if he is willing to try a decrease in the Neurontin, she can prescribe 100mg , three times a day and see if this helps with his symptoms. He is willing to try this and would like rx sent to [facility] in [place]. Also advised that he needs to contact Dr. Azucena Freed office to verify if neurontin will affect urine test. He verbalized understanding.

## 2015-07-24 NOTE — Telephone Encounter (Signed)
I am not sure if the gabapentin will affect the urine test, I woud agree that he can call dr Beryle Beams' s office and I am sure they have a doctor answering Dr. Azucena Freed calls if it can't wait. I can decrease the neurontin to 100mg  three times a day if he is willig to try that thanks

## 2015-07-24 NOTE — Telephone Encounter (Signed)
Done, thanks

## 2015-07-24 NOTE — Telephone Encounter (Signed)
Pt called and says that the gabapentin is making him very sleepy. He has some questions as well and would like to speak to someone about it all. He would not go over it with me just the nurse. Please call and advise  [phone]

## 2015-07-24 NOTE — Telephone Encounter (Signed)
Called pt back. He is taking the Gabapentin 300mg  3 times a day. It is making him very sleepy. He had a flair up of his diverticulitis and they gave him two types of antibiotics. He is finishing the cipro. He has not started any other new medications. No other complaints. He thinks it is too early to tell if the gabapentin is helping with his symptoms. Dr Beryle Beams (Dr. Jaynee Eagles referred him to him) wants him to do a 24hr urine collection. However, he was reading online that gabapentin might distort the results for this. I advised he should contact his office to verify and I will also speak with Dr. Jaynee Eagles. He said Dr. Beryle Beams is on vacation for the next couple weeks.  Told him I will call him back as soon as possible. Might not be until tomorrow morning. He is okay with this.

## 2015-07-26 ENCOUNTER — Other Ambulatory Visit: Payer: Medicare Other

## 2015-07-27 LAB — UIFE/LIGHT CHAINS/TP QN, 24-HR UR
% BETA, Urine: 0 %
ALBUMIN, U: 100 %
ALPHA 1 URINE: 0 %
ALPHA-2-GLOBULIN, U: 0 %
FREE KAPPA LT CHAINS, UR: 11.9 mg/L (ref 1.35–24.19)
FREE LAMBDA LT CHAINS, UR: 1.94 mg/L (ref 0.24–6.66)
GAMMA GLOBULIN URINE: 0 %
Kappa/Lambda Ratio,U: 6.13 (ref 2.04–10.37)
PROTEIN 24H UR: 120.4 mg/(24.h) (ref 30.0–150.0)
Protein, Ur: 8.3 mg/dL

## 2015-07-27 LAB — UPEP/TP, 24-HR URINE
ALBUMIN, U: 34.8 %
ALPHA 1 UR: 2.9 %
Alpha 2, Urine: 8.5 %
Beta, Urine: 38.3 %
Gamma Globulin, Urine: 15.6 %
PROTEIN UR: 7 mg/dL
Protein, 24H Urine: 101.5 mg/24 hr (ref 30.0–150.0)

## 2015-07-30 ENCOUNTER — Ambulatory Visit (INDEPENDENT_AMBULATORY_CARE_PROVIDER_SITE_OTHER): Payer: Medicare Other | Admitting: Pharmacist Clinician (PhC)/ Clinical Pharmacy Specialist

## 2015-07-30 ENCOUNTER — Other Ambulatory Visit: Payer: Self-pay | Admitting: Pharmacist Clinician (PhC)/ Clinical Pharmacy Specialist

## 2015-07-30 DIAGNOSIS — Z7901 Long term (current) use of anticoagulants: Secondary | ICD-10-CM

## 2015-07-30 DIAGNOSIS — I4891 Unspecified atrial fibrillation: Secondary | ICD-10-CM

## 2015-07-30 LAB — POCT INR: INR: 1.9

## 2015-07-30 MED ORDER — WARFARIN SODIUM 5 MG PO TABS: ORAL_TABLET | ORAL | Status: DC

## 2015-08-13 ENCOUNTER — Encounter: Payer: Self-pay | Admitting: Oncology

## 2015-08-13 ENCOUNTER — Ambulatory Visit (INDEPENDENT_AMBULATORY_CARE_PROVIDER_SITE_OTHER): Payer: Medicare Other | Admitting: Oncology

## 2015-08-13 VITALS — BP 144/57 | HR 62 | Temp 98.0°F | Ht 71.0 in | Wt 246.9 lb

## 2015-08-13 DIAGNOSIS — D472 Monoclonal gammopathy: Secondary | ICD-10-CM | POA: Diagnosis present

## 2015-08-13 DIAGNOSIS — I25700 Atherosclerosis of coronary artery bypass graft(s), unspecified, with unstable angina pectoris: Secondary | ICD-10-CM | POA: Diagnosis not present

## 2015-08-13 NOTE — Progress Notes (Signed)
Patient ID: Mike Price, male   DOB: [birth year], 80 y.o.   MRN: [id number] Hematology and Oncology Follow Up Visit  Mike Price [id number] 12-Oct-1934 80 y.o. 08/13/2015 5:29 PM   Principle Diagnosis: Encounter Diagnosis  Name Primary?  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 Please see my 07/10/2015 office consultations for complete details of his medical history. Briefly, he was referred by Dr. Lavell Anchors, neurology, when evaluation of progressive, stocking and glove, neuropathy. As part of that evaluation serum protein electrophoresis was done and showed a mild elevation of IgA immunoglobulin 542 mg percent (61-437) with normal IgE G and IgM levels. No M spike but a monoclonal IgA lambda paraprotein detected on immunofixation electrophoresis. He had a mild normochromic anemia with hemoglobin of 13.1 normal renal function. Normal albumin. Major findings on my exam included severe decrease in vibration sensation over the fingers of both hands. Unsteady gait to perm early to peripheral edema and degenerative arthritis limiting motion. I obtained additional studies: Kappa serum free light chains mildly elevated 21.9(3.3-19.4) with normal lambda 22.1 per hour and 5.7-26.3) and a normal kappa/lambda ratio 0.99 per and 0.26-1.65). 24 hour urine collection with total 24-hour protein 120 mg ( 30-150) and no monoclonal protein seen on immunofixation electrophoresis. These data essentially exclude multiple myeloma. Amyloidosis still a possibility but less likely with only a minimal elevation of total IgA and absence of significant free light chains in the urine collection. He was told that he has bilateral carpal tunnel syndrome and has seemed a Copy. Bilateral carpal tunnel syndrome can be a  clinical presentation of amyloidosis.   Medications: reviewed  Allergies:  Allergies  Allergen Reactions  . Lisinopril Cough  . Mefloquine Other (See Comments)    "made me crazy" anxious, upset    Physical Exam: Blood pressure 144/57, pulse 62, temperature 98 F (36.7 C), temperature source Oral, height 5' 11" (1.803 m), weight 246 lb 14.4 oz (111.993 kg), SpO2 100 %. Wt Readings from Last 3 Encounters:  08/13/15 246 lb 14.4 oz (111.993 kg)  07/10/15 252 lb 8 oz (114.533 kg)  06/19/15 243 lb 6.4 oz (110.406 kg)    Complete exam done at time of 07/10/2015 initial office consultation and not repeated today. Lab Results: CBC W/Diff    Component Value Date/Time   WBC 7.4 07/10/2015 1532   WBC 13.9* 01/20/2013 0420   RBC 3.65* 07/10/2015 1532   RBC 3.22* 01/20/2013 0420   HGB 11.4* 01/20/2013 0420   HCT 39.9 07/10/2015 1532   HCT 33.1* 01/20/2013 0420   PLT 151 01/20/2013 0420   MCV 102.8* 01/20/2013 0420   MCH 35.9* 07/10/2015 1532   MCH 35.4* 01/20/2013 0420   MCHC 32.8 07/10/2015 1532   MCHC 34.4 01/20/2013 0420   RDW 13.3 07/10/2015 1532   RDW 13.1 01/20/2013 0420   LYMPHSABS 1.6 07/10/2015 1532   LYMPHSABS 1.8 12/17/2012 1300   MONOABS 0.8 12/17/2012 1300   EOSABS 0.2 12/17/2012 1300   BASOSABS 0.0 07/10/2015 1532   BASOSABS 0.0 12/17/2012 1300     Chemistry      Component Value Date/Time   NA 139 06/19/2015 1637   NA 136 01/19/2013 0435   K 4.2 06/19/2015 1637   CL 96* 06/19/2015 1637   CO2 27 06/19/2015 1637   BUN  18 06/19/2015 1637   BUN 15 01/19/2013 0435   CREATININE 0.87 06/19/2015 1637      Component Value Date/Time   CALCIUM 9.4 06/19/2015 1637   ALKPHOS 109 06/19/2015 1637   AST 25 06/19/2015 1637   ALT 19 06/19/2015 1637   BILITOT 1.3* 06/19/2015 1637   BILITOT 1.3* 01/11/2013 1240      Impression:  IgA monoclonal gammopathy of undetermined significance No suspicion for multiple myeloma. Amyloidosis not excluded but clinically less  likely. See discussion above. I have contacted Dr Charlotte Crumb at the [facility] and left a message to send sample of tissue for amyloid when he does the surgery. I checked with Hematopathology, Dr Gari Crown, and tissue taken at time of carpal tunnel release can be stained for amyloid.  Entire visit spent with direct face-to-face consultation with the patient and his wife. I wrote down a summary of all the above lab data for the patient and reviewed this with him. Total time about 30 minutes.  CC: Patient Care Team: [name], MD as PCP - General (Internal Medicine)   Annia Belt, MD 11/7/20165:29 PM

## 2015-08-13 NOTE — Patient Instructions (Signed)
Visit 1 year  Lab 1-2 weeks before visit

## 2015-08-15 ENCOUNTER — Ambulatory Visit (INDEPENDENT_AMBULATORY_CARE_PROVIDER_SITE_OTHER): Payer: Medicare Other | Admitting: Pharmacist Clinician (PhC)/ Clinical Pharmacy Specialist

## 2015-08-15 DIAGNOSIS — Z7901 Long term (current) use of anticoagulants: Secondary | ICD-10-CM | POA: Diagnosis not present

## 2015-08-15 DIAGNOSIS — I4891 Unspecified atrial fibrillation: Secondary | ICD-10-CM | POA: Diagnosis not present

## 2015-08-15 LAB — POCT INR: INR: 2.2

## 2015-08-16 ENCOUNTER — Encounter: Payer: Self-pay | Admitting: Cardiovascular Disease

## 2015-08-17 ENCOUNTER — Ambulatory Visit: Payer: Medicare Other | Admitting: Cardiovascular Disease

## 2015-08-20 ENCOUNTER — Telehealth: Payer: Self-pay | Admitting: Neurology

## 2015-08-20 NOTE — Telephone Encounter (Signed)
Patient is calling as he is would like to get one more 30 day supply of Rx Gabapentin 100 mg at [facility] and then start getting this Rx @[facility] going forward.  Thanks!

## 2015-08-20 NOTE — Telephone Encounter (Signed)
I spoke with the pharmacist at [facility], they will fill 30 day Rx today.  I called back and spoke with the patient.  He is aware.  Requesting we send Rx to mail order next week sometime.

## 2015-08-22 ENCOUNTER — Encounter: Payer: Self-pay | Admitting: Cardiovascular Disease

## 2015-08-22 ENCOUNTER — Ambulatory Visit (INDEPENDENT_AMBULATORY_CARE_PROVIDER_SITE_OTHER): Payer: Medicare Other | Admitting: Cardiovascular Disease

## 2015-08-22 VITALS — BP 154/74 | HR 64 | Ht 70.5 in | Wt 249.5 lb

## 2015-08-22 DIAGNOSIS — Z950 Presence of cardiac pacemaker: Secondary | ICD-10-CM

## 2015-08-22 DIAGNOSIS — Z79899 Other long term (current) drug therapy: Secondary | ICD-10-CM

## 2015-08-22 DIAGNOSIS — I25700 Atherosclerosis of coronary artery bypass graft(s), unspecified, with unstable angina pectoris: Secondary | ICD-10-CM

## 2015-08-22 DIAGNOSIS — I482 Chronic atrial fibrillation, unspecified: Secondary | ICD-10-CM

## 2015-08-22 DIAGNOSIS — I10 Essential (primary) hypertension: Secondary | ICD-10-CM | POA: Diagnosis not present

## 2015-08-22 DIAGNOSIS — Z45010 Encounter for checking and testing of cardiac pacemaker pulse generator [battery]: Secondary | ICD-10-CM

## 2015-08-22 DIAGNOSIS — I25708 Atherosclerosis of coronary artery bypass graft(s), unspecified, with other forms of angina pectoris: Secondary | ICD-10-CM | POA: Diagnosis not present

## 2015-08-22 NOTE — Progress Notes (Signed)
Patient ID: Mike Price, male   DOB: 04-15-1935, 80 y.o.   MRN: [id number]     Cardiology Office Note   Date:  08/22/2015   ID:  Mike Price, DOB 1935-02-09, MRN [id number]  PCP:  Criselda Peaches, MD  Cardiologist:  Quay Burow, MD;  Sanda Klein, MD   Chief Complaint  Patient presents with  . Annual Exam    no chest pain, occassional shortness of breath, no edema, no pain in legs, occassional cramping in legs, no lightheadedness, no dizziness      History of Present Illness: Mike Price is a 80 y.o. male who presents for pacemaker follow up. He sees Dr. Gwenlyn Found for CAD s/p CABG 2001 and hyperlipidemia. He has had a pacemaker since 2008 (Medtronic) and has previously had cardioversion for atrial fibrillation. He has been in uninterrupted atrial fibrillation for about 2.5 years now and appears oblivious to the arrhythmia. At one point the arrhythmia appeared organized, flutter-like, but overdrive pacing was unsuccessful. He is anticoagulated, without CVA/TIA or bleeding problems.   his pacemaker reached elective replacement indicator on September 27. He is now pacing VVI 65 bpm.. 100% atrial fibrillation , today with very slow ventricular response (essentially complete heart block). Has 99.7% V pacing and only very brief occasions of RVR, usually very well rate controlled. Lead parameters are normal.    he states that maybe there has been a little reduction in stamina, but it is unclear whether that coincides with the device reaching ERI and loss of rate response.  He has neuropathy and MGUS,  he has had problems with bilateral carpal tunnel syndrome and diverticulitis. Past Medical History  Diagnosis Date  . Hypertension   . Thyroid disease   . TMJ tenderness 01-11-13    right side-has issues from time to time.  . Pacemaker 12/15/06    3'08  . Dysrhythmia 01-11-13     hx. A. Fib-Pacemaker implanted left chest  . Hypothyroidism   . Anxiety 01-11-13    tx. Clonazepam.  .  Edema extremities 01-11-13    retains fluid in legs-right greater than left.  . Neuromuscular disorder ([place])     neuropathy in feet  . GERD (gastroesophageal reflux disease)   . Arthritis     Osteoarthritis-knees, fingers  . Anemia   . CAD (coronary artery disease)   . Hyperlipemia   . Obstructive sleep apnea     on C Pap  [name] 07/10/2015    IgA 542 mg% 06/19/15. IgA lambda paraprotein on IFE, normal IgG & IgM    Past Surgical History  Procedure Laterality Date  . Coronary artery bypass graft  2001    4 vessels-'00  . Tonsillectomy      child  . Appendectomy    . Insert / replace / remove pacemaker      '08-inserted left chest  . Cervical laminectomy    . Hernia repair    . Joint replacement      LTHA  . Total knee arthroplasty Right 01/17/2013    Procedure: RIGHT TOTAL KNEE ARTHROPLASTY;  Surgeon: Gearlean Alf, MD;  Location: [facility];  Service: Orthopedics;  Laterality: Right;  . Cardiac catheterization  03/28/08    patent grafts, nl EF  . Pacemaker insertion  12/15/06    medtronic  . US echocardiography  12/28/2007    LA mod. dilated,RA mildly dilated  . Nm myocar perf wall motion  02/12/2012    small fixed  distal anteroapical/apical defect. No reversible ischemia     Current Outpatient Prescriptions  Medication Sig Dispense Refill  . acetaminophen (TYLENOL) 325 MG tablet Take 2 tablets (650 mg total) by mouth every 6 (six) hours as needed. 60 tablet 0  . aspirin 81 MG tablet Take 81 mg by mouth daily.    [name] Kitchen atorvastatin (LIPITOR) 40 MG tablet Take 40 mg by mouth every evening.     . bisacodyl (DULCOLAX) 10 MG suppository Place 1 suppository (10 mg total) rectally daily as needed. 12 suppository 0  . clonazePAM (KLONOPIN) 0.5 MG tablet Take 0.25-0.5 mg by mouth 3 (three) times daily as needed. Takes 1/2 tablet twice daily then a whole tablet at bedtime    . doxazosin (CARDURA) 2 MG tablet Take 1 mg by mouth at bedtime.    [name] Kitchen  ezetimibe (ZETIA) 10 MG tablet Take 10 mg by mouth every evening.     . ferrous sulfate 325 (65 FE) MG tablet Take 325 mg by mouth every other day. On Monday, Wednesday and Friday    . furosemide (LASIX) 20 MG tablet Take 10 mg by mouth daily.     [name] Kitchen gabapentin (NEURONTIN) 100 MG capsule Take 1 capsule (100 mg total) by mouth 3 (three) times daily. 90 capsule 6  . losartan (COZAAR) 100 MG tablet Take 100 mg by mouth daily.    . metoprolol (LOPRESSOR) 100 MG tablet Take 100 mg by mouth 2 (two) times daily.    . NON FORMULARY CPAP qhs    . omeprazole (PRILOSEC) 20 MG capsule Take 20 mg by mouth 2 (two) times daily.    [name] Kitchen SYNTHROID 88 MCG tablet Take 88 mcg by mouth daily.    . traMADol (ULTRAM) 50 MG tablet Take 50 mg by mouth every 6 (six) hours as needed for pain.    [name] Kitchen warfarin (COUMADIN) 5 MG tablet Take 1 to 1.5 tablets by mouth daily as directed by coumadin clinic 120 tablet 1   No current facility-administered medications for this visit.    Allergies:   Lisinopril and Mefloquine    Social History:  The patient  reports that he quit smoking about 31 years ago. He does not have any smokeless tobacco history on file. He reports that he drinks about 1.8 oz of alcohol per week. He reports that he does not use illicit drugs.   Family History:  The patient's family history includes Stroke in an other family member. There is no history of Neuropathy.    ROS:  Please see the history of present illness.    Otherwise, review of systems positive for none.   All other systems are reviewed and negative.    PHYSICAL EXAM: VS:  BP 154/74 mmHg  Pulse 64  Ht 5' 10.5" (1.791 m)  Wt 249 lb 8 oz (113.172 kg)  BMI 35.28 kg/m2 , BMI Body mass index is 35.28 kg/(m^2).  General: Alert, oriented x3, no distress Head: no evidence of trauma, PERRL, EOMI, no exophtalmos or lid lag, no myxedema, no xanthelasma; normal ears, nose and oropharynx Neck: normal jugular venous pulsations and no hepatojugular  reflux; brisk carotid pulses without delay and no carotid bruits Chest: clear to auscultation, no signs of consolidation by percussion or palpation, normal fremitus, symmetrical and full respiratory excursions,  Healthy left subclavian pacemaker site Cardiovascular: normal position and quality of the apical impulse, regular rhythm, normal first and paradoxically split second heart sounds, no murmurs, rubs or gallops Abdomen: no tenderness or distention, no masses  by palpation, no abnormal pulsatility or arterial bruits, normal bowel sounds, no hepatosplenomegaly Extremities: no clubbing, cyanosis or edema; 2+ radial, ulnar and brachial pulses bilaterally; 2+ right femoral, posterior tibial and dorsalis pedis pulses; 2+ left femoral, posterior tibial and dorsalis pedis pulses; no subclavian or femoral bruits Neurological: grossly nonfocal Psych: euthymic mood, full affect   EKG:  EKG is ordered today. The ekg ordered today demonstrates  Atrial fibrillation with 100% ventricular pacing   Recent Labs: 06/19/2015: ALT 19; BUN 18; Creatinine, Ser 0.87; Potassium 4.2; Sodium 139    Lipid Panel No results found for: CHOL, TRIG, HDL, CHOLHDL, VLDL, LDLCALC, LDLDIRECT    Wt Readings from Last 3 Encounters:  08/22/15 249 lb 8 oz (113.172 kg)  08/13/15 246 lb 14.4 oz (111.993 kg)  07/10/15 252 lb 8 oz (114.533 kg)     ASSESSMENT AND PLAN:  1.  Atrial fibrillation with slow ventricular response on chronic anticoagulation without bleeding or embolic complications.  Virtually100% ventricular pacing due to slow ventricular response  2.   Dual chamber device programmed as single-chamber permanent pacemaker at elective replacement interval.  He would like to delay this procedure until after Thanksgiving. Will check with his wife and we plan to schedule it for either November 29 or December 6.  We'll plan for him to remain on warfarin anticoagulation but need to make sure his INR is not excessively  elevated before the procedure. This procedure has been fully reviewed with the patient and informed consent has been obtained.   3. CAD s/p CABG,  No angina   Current medicines are reviewed at length with the patient today.  The patient does not have concerns regarding medicines.  The following changes have been made:  no change  Labs/ tests ordered today include:   Orders Placed This Encounter  Procedures  . APTT  . Protime-INR  . CBC  . Comprehensive metabolic panel  . EKG 12-Lead  . PACEMAKER GENERATOR CHANGE     Patient Instructions  Your physician has recommended that you have your pacemaker [place]. A pacemaker is a small device that is placed under the skin of your chest or abdomen to help control abnormal heart rhythms. This device uses electrical pulses to prompt the heart to beat at a normal rate. Pacemakers are used to treat heart rhythms that are too slow. Wire (leads) are attached to the pacemaker that goes into the chambers of you heart. This is done in the hospital and usually requires and overnight stay. Please see the instruction sheet given to you today for more information.  Your physician recommends that you return for lab work in:   INR BY [place]. PRE-PROCEDURE LABS 3-4 DAYS PRIOR TO THE PROCEDURE.      Mikael Spray, MD  08/22/2015 10:47 AM    Sanda Klein, MD, [facility] [phone] office [phone] pager

## 2015-08-22 NOTE — Patient Instructions (Signed)
Your physician has recommended that you have your pacemaker [place]. A pacemaker is a small device that is placed under the skin of your chest or abdomen to help control abnormal heart rhythms. This device uses electrical pulses to prompt the heart to beat at a normal rate. Pacemakers are used to treat heart rhythms that are too slow. Wire (leads) are attached to the pacemaker that goes into the chambers of you heart. This is done in the hospital and usually requires and overnight stay. Please see the instruction sheet given to you today for more information.  Your physician recommends that you return for lab work in:   INR BY [place]. PRE-PROCEDURE LABS 3-4 DAYS PRIOR TO THE PROCEDURE.

## 2015-08-29 ENCOUNTER — Other Ambulatory Visit: Payer: Self-pay | Admitting: *Deleted

## 2015-08-29 DIAGNOSIS — Z45010 Encounter for checking and testing of cardiac pacemaker pulse generator [battery]: Secondary | ICD-10-CM

## 2015-08-29 LAB — CUP PACEART INCLINIC DEVICE CHECK
Brady Statistic RV Percent Paced: 100 %
Date Time Interrogation Session: 20161116150554
Implantable Lead Implant Date: 20080311
Implantable Lead Location: 753860
Implantable Lead Model: 5076
Lead Channel Impedance Value: 67 Ohm
Lead Channel Setting Pacing Amplitude: 2.5 V
Lead Channel Setting Sensing Sensitivity: 5.6 mV
MDC IDC LEAD IMPLANT DT: 20080311
MDC IDC LEAD LOCATION: 753859
MDC IDC MSMT BATTERY IMPEDANCE: 8926 Ohm
MDC IDC MSMT BATTERY REMAINING LONGEVITY: -3
MDC IDC MSMT BATTERY VOLTAGE: 2.55 V
MDC IDC MSMT LEADCHNL RV IMPEDANCE VALUE: 682 Ohm
MDC IDC SET LEADCHNL RV PACING PULSEWIDTH: 0.4 ms

## 2015-09-02 MED ORDER — GABAPENTIN 100 MG PO CAPS: 100.0000 mg | ORAL_CAPSULE | Freq: Three times a day (TID) | ORAL | Status: DC

## 2015-09-05 ENCOUNTER — Ambulatory Visit: Payer: Medicare Other | Admitting: Pharmacist Clinician (PhC)/ Clinical Pharmacy Specialist

## 2015-09-05 LAB — CBC
HCT: 43.5 % (ref 39.0–52.0)
HEMOGLOBIN: 14.4 g/dL (ref 13.0–17.0)
MCH: 35.3 pg — AB (ref 26.0–34.0)
MCHC: 33.1 g/dL (ref 30.0–36.0)
MCV: 106.6 fL — ABNORMAL HIGH (ref 78.0–100.0)
MPV: 11 fL (ref 8.6–12.4)
PLATELETS: 147 10*3/uL — AB (ref 150–400)
RBC: 4.08 MIL/uL — ABNORMAL LOW (ref 4.22–5.81)
RDW: 13.4 % (ref 11.5–15.5)
WBC: 7.3 10*3/uL (ref 4.0–10.5)

## 2015-09-05 LAB — PROTIME-INR
INR: 3.22 — AB (ref <1.50)
PROTHROMBIN TIME: 33.4 s — AB (ref 11.6–15.2)

## 2015-09-05 LAB — COMPREHENSIVE METABOLIC PANEL
ALK PHOS: 90 U/L (ref 40–115)
ALT: 20 U/L (ref 9–46)
AST: 31 U/L (ref 10–35)
Albumin: 3.7 g/dL (ref 3.6–5.1)
BUN: 16 mg/dL (ref 7–25)
CO2: 32 mmol/L — ABNORMAL HIGH (ref 20–31)
CREATININE: 0.75 mg/dL (ref 0.70–1.18)
Calcium: 9 mg/dL (ref 8.6–10.3)
Chloride: 99 mmol/L (ref 98–110)
Glucose, Bld: 97 mg/dL (ref 65–99)
Potassium: 4.7 mmol/L (ref 3.5–5.3)
SODIUM: 137 mmol/L (ref 135–146)
TOTAL PROTEIN: 6.8 g/dL (ref 6.1–8.1)
Total Bilirubin: 1.8 mg/dL — ABNORMAL HIGH (ref 0.2–1.2)

## 2015-09-05 LAB — APTT: APTT: 49 s — AB (ref 24–37)

## 2015-09-07 ENCOUNTER — Encounter: Payer: Self-pay | Admitting: Cardiovascular Disease

## 2015-09-07 ENCOUNTER — Ambulatory Visit (INDEPENDENT_AMBULATORY_CARE_PROVIDER_SITE_OTHER): Payer: Medicare Other | Admitting: Pharmacist Clinician (PhC)/ Clinical Pharmacy Specialist

## 2015-09-07 DIAGNOSIS — I4891 Unspecified atrial fibrillation: Secondary | ICD-10-CM

## 2015-09-07 DIAGNOSIS — Z7901 Long term (current) use of anticoagulants: Secondary | ICD-10-CM | POA: Diagnosis not present

## 2015-09-07 LAB — POCT INR: INR: 2.6

## 2015-09-10 ENCOUNTER — Telehealth: Payer: Self-pay | Admitting: Cardiovascular Disease

## 2015-09-10 NOTE — Telephone Encounter (Signed)
He is suppose to have battery changed in his pacemaker tomorrow. He wants to give you an update of his condition.

## 2015-09-10 NOTE — Telephone Encounter (Signed)
Spoke with him on the phone. He has symptoms of an upper respiratory tract infection, but does not have fever or chills.  Will go ahead with planned generator change tomorrow.

## 2015-09-10 NOTE — Telephone Encounter (Signed)
Dr.C spoke to patient

## 2015-09-11 ENCOUNTER — Ambulatory Visit (HOSPITAL_COMMUNITY)
Admission: RE | Admit: 2015-09-11 | Discharge: 2015-09-11 | Disposition: A | Discharge status: Home / Self Care | Payer: Medicare Other | Source: Ambulatory Visit | Attending: Cardiovascular Disease | Admitting: Cardiovascular Disease

## 2015-09-11 ENCOUNTER — Encounter (HOSPITAL_COMMUNITY)
Admission: RE | Disposition: A | Discharge status: Home / Self Care | Payer: Medicare Other | Source: Ambulatory Visit | Attending: Cardiovascular Disease

## 2015-09-11 DIAGNOSIS — I2510 Atherosclerotic heart disease of native coronary artery without angina pectoris: Secondary | ICD-10-CM | POA: Insufficient documentation

## 2015-09-11 DIAGNOSIS — Z7901 Long term (current) use of anticoagulants: Secondary | ICD-10-CM | POA: Diagnosis not present

## 2015-09-11 DIAGNOSIS — E785 Hyperlipidemia, unspecified: Secondary | ICD-10-CM | POA: Insufficient documentation

## 2015-09-11 DIAGNOSIS — Z4502 Encounter for adjustment and management of automatic implantable cardiac defibrillator: Secondary | ICD-10-CM | POA: Diagnosis not present

## 2015-09-11 DIAGNOSIS — Z45010 Encounter for checking and testing of cardiac pacemaker pulse generator [battery]: Secondary | ICD-10-CM

## 2015-09-11 DIAGNOSIS — E039 Hypothyroidism, unspecified: Secondary | ICD-10-CM | POA: Insufficient documentation

## 2015-09-11 DIAGNOSIS — Z951 Presence of aortocoronary bypass graft: Secondary | ICD-10-CM | POA: Diagnosis not present

## 2015-09-11 DIAGNOSIS — Z79899 Other long term (current) drug therapy: Secondary | ICD-10-CM | POA: Diagnosis not present

## 2015-09-11 DIAGNOSIS — G629 Polyneuropathy, unspecified: Secondary | ICD-10-CM | POA: Diagnosis not present

## 2015-09-11 DIAGNOSIS — G4733 Obstructive sleep apnea (adult) (pediatric): Secondary | ICD-10-CM | POA: Diagnosis not present

## 2015-09-11 DIAGNOSIS — Z7982 Long term (current) use of aspirin: Secondary | ICD-10-CM | POA: Diagnosis not present

## 2015-09-11 DIAGNOSIS — I4891 Unspecified atrial fibrillation: Secondary | ICD-10-CM | POA: Insufficient documentation

## 2015-09-11 DIAGNOSIS — I442 Atrioventricular block, complete: Secondary | ICD-10-CM | POA: Diagnosis not present

## 2015-09-11 DIAGNOSIS — I10 Essential (primary) hypertension: Secondary | ICD-10-CM | POA: Diagnosis not present

## 2015-09-11 HISTORY — PX: EP IMPLANTABLE DEVICE: SHX172B

## 2015-09-11 LAB — PROTIME-INR
INR: 2.05 — AB (ref 0.00–1.49)
Prothrombin Time: 22.9 seconds — ABNORMAL HIGH (ref 11.6–15.2)

## 2015-09-11 SURGERY — PPM/BIV PPM GENERATOR CHANGEOUT
Anesthesia: LOCAL

## 2015-09-11 MED ORDER — SODIUM CHLORIDE 0.9 % IJ SOLN: 3.0000 mL | INTRAMUSCULAR | Status: DC | PRN

## 2015-09-11 MED ORDER — SODIUM CHLORIDE 0.9 % IV SOLN: INTRAVENOUS | Status: DC

## 2015-09-11 MED ORDER — CEFAZOLIN SODIUM-DEXTROSE 2-3 GM-% IV SOLR
INTRAVENOUS | Status: DC | PRN
  Administered 2015-09-11: 2 g via INTRAVENOUS

## 2015-09-11 MED ORDER — CEFAZOLIN SODIUM-DEXTROSE 2-3 GM-% IV SOLR
INTRAVENOUS | Status: AC
  Filled 2015-09-11: qty 50

## 2015-09-11 MED ORDER — LIDOCAINE HCL (PF) 1 % IJ SOLN
INTRAMUSCULAR | Status: AC
  Filled 2015-09-11: qty 60

## 2015-09-11 MED ORDER — SODIUM CHLORIDE 0.9 % IR SOLN
80.0000 mg | Status: DC
  Filled 2015-09-11: qty 2

## 2015-09-11 MED ORDER — CEFAZOLIN SODIUM-DEXTROSE 2-3 GM-% IV SOLR: 2.0000 g | INTRAVENOUS | Status: DC

## 2015-09-11 MED ORDER — SODIUM CHLORIDE 0.9 % IV SOLN
INTRAVENOUS | Status: DC
  Administered 2015-09-11: 1000 mL via INTRAVENOUS

## 2015-09-11 MED ORDER — ONDANSETRON HCL 4 MG/2ML IJ SOLN: 4.0000 mg | Freq: Four times a day (QID) | INTRAMUSCULAR | Status: DC | PRN

## 2015-09-11 MED ORDER — SODIUM CHLORIDE 0.9 % IR SOLN
Status: AC
  Filled 2015-09-11: qty 2

## 2015-09-11 MED ORDER — ACETAMINOPHEN 325 MG PO TABS
325.0000 mg | ORAL_TABLET | ORAL | Status: DC | PRN
  Filled 2015-09-11: qty 2

## 2015-09-11 MED ORDER — CHLORHEXIDINE GLUCONATE 4 % EX LIQD: 60.0000 mL | Freq: Once | CUTANEOUS | Status: DC

## 2015-09-11 SURGICAL SUPPLY — 6 items
CABLE SURGICAL S-101-97-12 (CABLE) ×2 IMPLANT
ELECT DEFIB PAD ADLT CADENCE (PAD) IMPLANT
PACEMAKER ADAPTA DR ADDRL1 (Pacemaker) ×1 IMPLANT
PAD DEFIB LIFELINK (PAD) ×2 IMPLANT
PPM ADAPTA DR ADDRL1 (Pacemaker) ×2 IMPLANT
TRAY PACEMAKER INSERTION (PACKS) ×2 IMPLANT

## 2015-09-11 NOTE — Interval H&P Note (Signed)
History and Physical Interval Note:  09/11/2015 2:13 PM  Mike Price  has presented today for surgery, with the diagnosis of ERI  The various methods of treatment have been discussed with the patient and family. After consideration of risks, benefits and other options for treatment, the patient has consented to  Procedure(s): PPM Generator Changeout (N/A) as a surgical intervention .  The patient's history has been reviewed, patient examined, no change in status, stable for surgery.  I have reviewed the patient's chart and labs.  Questions were answered to the patient's satisfaction.     Luane Rochon

## 2015-09-11 NOTE — H&P (View-Only) (Signed)
Patient ID: Mike Price, male   DOB: [birth year], 80 y.o.   MRN: [id number]     Cardiology Office Note   Date:  08/22/2015   ID:  Mike Price, DOB Jun 23, 1935, MRN [id number]  PCP:  Criselda Peaches, MD  Cardiologist:  Quay Burow, MD;  Sanda Klein, MD   Chief Complaint  Patient presents with  . Annual Exam    no chest pain, occassional shortness of breath, no edema, no pain in legs, occassional cramping in legs, no lightheadedness, no dizziness      History of Present Illness: Mike Price is a 80 y.o. male who presents for pacemaker follow up. He sees Dr. Gwenlyn Found for CAD s/p CABG 2001 and hyperlipidemia. He has had a pacemaker since 2008 (Medtronic) and has previously had cardioversion for atrial fibrillation. He has been in uninterrupted atrial fibrillation for about 2.5 years now and appears oblivious to the arrhythmia. At one point the arrhythmia appeared organized, flutter-like, but overdrive pacing was unsuccessful. He is anticoagulated, without CVA/TIA or bleeding problems.   his pacemaker reached elective replacement indicator on September 27. He is now pacing VVI 65 bpm.. 100% atrial fibrillation , today with very slow ventricular response (essentially complete heart block). Has 99.7% V pacing and only very brief occasions of RVR, usually very well rate controlled. Lead parameters are normal.    he states that maybe there has been a little reduction in stamina, but it is unclear whether that coincides with the device reaching ERI and loss of rate response.  He has neuropathy and MGUS,  he has had problems with bilateral carpal tunnel syndrome and diverticulitis. Past Medical History  Diagnosis Date  . Hypertension   . Thyroid disease   . TMJ tenderness 01-11-13    right side-has issues from time to time.  . Pacemaker 12/15/06    3'08  . Dysrhythmia 01-11-13     hx. A. Fib-Pacemaker implanted left chest  . Hypothyroidism   . Anxiety 01-11-13    tx. Clonazepam.  .  Edema extremities 01-11-13    retains fluid in legs-right greater than left.  . Neuromuscular disorder ([place])     neuropathy in feet  . GERD (gastroesophageal reflux disease)   . Arthritis     Osteoarthritis-knees, fingers  . Anemia   . CAD (coronary artery disease)   . Hyperlipemia   . Obstructive sleep apnea     on C Pap  [name] 07/10/2015    IgA 542 mg% 06/19/15. IgA lambda paraprotein on IFE, normal IgG & IgM    Past Surgical History  Procedure Laterality Date  . Coronary artery bypass graft  2001    4 vessels-'00  . Tonsillectomy      child  . Appendectomy    . Insert / replace / remove pacemaker      '08-inserted left chest  . Cervical laminectomy    . Hernia repair    . Joint replacement      LTHA  . Total knee arthroplasty Right 01/17/2013    Procedure: RIGHT TOTAL KNEE ARTHROPLASTY;  Surgeon: Gearlean Alf, MD;  Location: [facility];  Service: Orthopedics;  Laterality: Right;  . Cardiac catheterization  03/28/08    patent grafts, nl EF  . Pacemaker insertion  12/15/06    medtronic  . US echocardiography  12/28/2007    LA mod. dilated,RA mildly dilated  . Nm myocar perf wall motion  02/12/2012    small fixed  distal anteroapical/apical defect. No reversible ischemia     Current Outpatient Prescriptions  Medication Sig Dispense Refill  . acetaminophen (TYLENOL) 325 MG tablet Take 2 tablets (650 mg total) by mouth every 6 (six) hours as needed. 60 tablet 0  . aspirin 81 MG tablet Take 81 mg by mouth daily.    [name] Kitchen atorvastatin (LIPITOR) 40 MG tablet Take 40 mg by mouth every evening.     . bisacodyl (DULCOLAX) 10 MG suppository Place 1 suppository (10 mg total) rectally daily as needed. 12 suppository 0  . clonazePAM (KLONOPIN) 0.5 MG tablet Take 0.25-0.5 mg by mouth 3 (three) times daily as needed. Takes 1/2 tablet twice daily then a whole tablet at bedtime    . doxazosin (CARDURA) 2 MG tablet Take 1 mg by mouth at bedtime.    [name] Kitchen  ezetimibe (ZETIA) 10 MG tablet Take 10 mg by mouth every evening.     . ferrous sulfate 325 (65 FE) MG tablet Take 325 mg by mouth every other day. On Monday, Wednesday and Friday    . furosemide (LASIX) 20 MG tablet Take 10 mg by mouth daily.     [name] Kitchen gabapentin (NEURONTIN) 100 MG capsule Take 1 capsule (100 mg total) by mouth 3 (three) times daily. 90 capsule 6  . losartan (COZAAR) 100 MG tablet Take 100 mg by mouth daily.    . metoprolol (LOPRESSOR) 100 MG tablet Take 100 mg by mouth 2 (two) times daily.    . NON FORMULARY CPAP qhs    . omeprazole (PRILOSEC) 20 MG capsule Take 20 mg by mouth 2 (two) times daily.    [name] Kitchen SYNTHROID 88 MCG tablet Take 88 mcg by mouth daily.    . traMADol (ULTRAM) 50 MG tablet Take 50 mg by mouth every 6 (six) hours as needed for pain.    [name] Kitchen warfarin (COUMADIN) 5 MG tablet Take 1 to 1.5 tablets by mouth daily as directed by coumadin clinic 120 tablet 1   No current facility-administered medications for this visit.    Allergies:   Lisinopril and Mefloquine    Social History:  The patient  reports that he quit smoking about 31 years ago. He does not have any smokeless tobacco history on file. He reports that he drinks about 1.8 oz of alcohol per week. He reports that he does not use illicit drugs.   Family History:  The patient's family history includes Stroke in an other family member. There is no history of Neuropathy.    ROS:  Please see the history of present illness.    Otherwise, review of systems positive for none.   All other systems are reviewed and negative.    PHYSICAL EXAM: VS:  BP 154/74 mmHg  Pulse 64  Ht 5' 10.5" (1.791 m)  Wt 249 lb 8 oz (113.172 kg)  BMI 35.28 kg/m2 , BMI Body mass index is 35.28 kg/(m^2).  General: Alert, oriented x3, no distress Head: no evidence of trauma, PERRL, EOMI, no exophtalmos or lid lag, no myxedema, no xanthelasma; normal ears, nose and oropharynx Neck: normal jugular venous pulsations and no hepatojugular  reflux; brisk carotid pulses without delay and no carotid bruits Chest: clear to auscultation, no signs of consolidation by percussion or palpation, normal fremitus, symmetrical and full respiratory excursions,  Healthy left subclavian pacemaker site Cardiovascular: normal position and quality of the apical impulse, regular rhythm, normal first and paradoxically split second heart sounds, no murmurs, rubs or gallops Abdomen: no tenderness or distention, no masses  by palpation, no abnormal pulsatility or arterial bruits, normal bowel sounds, no hepatosplenomegaly Extremities: no clubbing, cyanosis or edema; 2+ radial, ulnar and brachial pulses bilaterally; 2+ right femoral, posterior tibial and dorsalis pedis pulses; 2+ left femoral, posterior tibial and dorsalis pedis pulses; no subclavian or femoral bruits Neurological: grossly nonfocal Psych: euthymic mood, full affect   EKG:  EKG is ordered today. The ekg ordered today demonstrates  Atrial fibrillation with 100% ventricular pacing   Recent Labs: 06/19/2015: ALT 19; BUN 18; Creatinine, Ser 0.87; Potassium 4.2; Sodium 139    Lipid Panel No results found for: CHOL, TRIG, HDL, CHOLHDL, VLDL, LDLCALC, LDLDIRECT    Wt Readings from Last 3 Encounters:  08/22/15 249 lb 8 oz (113.172 kg)  08/13/15 246 lb 14.4 oz (111.993 kg)  07/10/15 252 lb 8 oz (114.533 kg)     ASSESSMENT AND PLAN:  1.  Atrial fibrillation with slow ventricular response on chronic anticoagulation without bleeding or embolic complications.  Virtually100% ventricular pacing due to slow ventricular response  2.   Dual chamber device programmed as single-chamber permanent pacemaker at elective replacement interval.  He would like to delay this procedure until after Thanksgiving. Will check with his wife and we plan to schedule it for either November 29 or December 6.  We'll plan for him to remain on warfarin anticoagulation but need to make sure his INR is not excessively  elevated before the procedure. This procedure has been fully reviewed with the patient and informed consent has been obtained.   3. CAD s/p CABG,  No angina   Current medicines are reviewed at length with the patient today.  The patient does not have concerns regarding medicines.  The following changes have been made:  no change  Labs/ tests ordered today include:   Orders Placed This Encounter  Procedures  . APTT  . Protime-INR  . CBC  . Comprehensive metabolic panel  . EKG 12-Lead  . PACEMAKER GENERATOR CHANGE     Patient Instructions  Your physician has recommended that you have your pacemaker [place]. A pacemaker is a small device that is placed under the skin of your chest or abdomen to help control abnormal heart rhythms. This device uses electrical pulses to prompt the heart to beat at a normal rate. Pacemakers are used to treat heart rhythms that are too slow. Wire (leads) are attached to the pacemaker that goes into the chambers of you heart. This is done in the hospital and usually requires and overnight stay. Please see the instruction sheet given to you today for more information.  Your physician recommends that you return for lab work in:   INR BY [place]. PRE-PROCEDURE LABS 3-4 DAYS PRIOR TO THE PROCEDURE.      Mikael Spray, MD  08/22/2015 10:47 AM    Sanda Klein, MD, [facility] [phone] office [phone] pager

## 2015-09-11 NOTE — Op Note (Signed)
Procedure report  Procedure performed:  1. Dual chamber generator changeout  2. Light sedation  Reason for procedure:  1. Device generator at elective replacement interval  2. Complete heart block Procedure performed by:  Sanda Klein, MD  Complications:  None  Estimated blood loss:  <5 mL  Medications administered during procedure:  Ancef 2 g intravenously, lidocaine 1% 30 mL locally Device details:  New Generator Medtronic Adapta ADDRL1, serial number [id number] Right atrial lead (chronic) Medtronic [id number], serial number [id number] (implanted 12/15/2006) Right ventricular lead (chronic)  Medtronic [id number], serial number [id number] (implanted 12/15/2006)   Explanted generator Medtronic Adapta ,  model number ADDRL1, serial number  :[id number] (implanted 12/15/2006)  Procedure details:  After the risks and benefits of the procedure were discussed the patient provided informed consent. She was brought to the cardiac catheter lab in the fasting state. The patient was prepped and draped in usual sterile fashion. Local anesthesia with 1% lidocaine was administered to to the left infraclavicular area. A 5-6cm horizontal incision was made parallel with and 2-3 cm caudal to the  clavicle, in the area of an old scar.  Using minimal electrocautery and mostly sharp and blunt dissection the prepectoral pocket was opened carefully to avoid injury to the loops of chronic leads. Extensive dissection was not necessary. The device was explanted. The pocket was carefully inspected for hemostasis and flushed with copious amounts of antibiotic solution.  The leads were disconnected from the old generator and testing of the lead parameters showed excellent values. The new generator was connected to the chronic leads, with appropriate pacing noted.   The entire system was then carefully inserted in the pocket with care been taking that the leads and device assumed a comfortable position without pressure on  the incision. Great care was taken that the leads be located deep to the generator. The pocket was then closed in layers using 2 layers of 2-0 Vicryl and cutaneous staples after which a sterile dressing was applied.   At the end of the procedure the following lead parameters were encountered:   Right atrial lead sensed P waves: none due to atrial fibrillation Right ventricular lead sensed R waves  None detected, impedance 694 ohms, threshold 1.0 at 0.4 ms pulse width. Sanda Klein, MD, [facility] [phone] office [phone] pager

## 2015-09-11 NOTE — Discharge Instructions (Signed)

## 2015-09-12 ENCOUNTER — Encounter (HOSPITAL_COMMUNITY): Payer: Self-pay | Admitting: Cardiovascular Disease

## 2015-09-12 ENCOUNTER — Telehealth: Payer: Self-pay | Admitting: Cardiovascular Disease

## 2015-09-12 MED FILL — Gentamicin Sulfate Inj 40 MG/ML: INTRAMUSCULAR | Qty: 2 | Status: AC

## 2015-09-12 MED FILL — Sodium Chloride Irrigation Soln 0.9%: Qty: 500 | Status: AC

## 2015-09-12 MED FILL — Lidocaine HCl Local Preservative Free (PF) Inj 1%: INTRAMUSCULAR | Qty: 60 | Status: AC

## 2015-09-12 NOTE — Telephone Encounter (Signed)
Returned call to patient Dr.Croitoru's recommendations given.Advised I will send message to device clinic for appointment next week to have staples removed and wound check.

## 2015-09-12 NOTE — Telephone Encounter (Signed)
Okay to take tramadol. I'm not surprised they're still a little oozing, since he was fully anticoagulated with warfarin. Suspect it will resolve completely by tomorrow when he takes the dressing off. He does not have Steri-Strips.  He has staples and should have received an appointment to come back to the clinic next week for staple removal and wound check. Yes, he should call back if the swelling worsens, especially if it looks like the edges of the incision are being pulled apart/under tension.

## 2015-09-12 NOTE — Telephone Encounter (Signed)
Rescheduled pt wound check appt from 12-21 to 12-14 at 1:30 PM. Pt agreed to appt.

## 2015-09-12 NOTE — Telephone Encounter (Signed)
Returned call to patient.Stated he wanted to ask Dr.Croitoru if ok to take Tramadol for pain.Stated his bandage has a couple of spots of old blood.Has some swelling, but not the size of a lime.Stated he was told to remove bandage tomorrow 48 hrs after PM Implant.Advised to leave steri strips on if any, they will gradually fall off.Advised to call back if notices any new blood and if swelling becomes worse.Message sent to Dr.Croitoru for advice.

## 2015-09-12 NOTE — Telephone Encounter (Signed)
Calling because he is taking Tramadol and wants to know is it ok to take that. Also he is seeping and wants to know if that is okay as well. Please call   Thanks

## 2015-09-13 ENCOUNTER — Telehealth: Payer: Self-pay | Admitting: Cardiovascular Disease

## 2015-09-13 NOTE — Telephone Encounter (Signed)
Patient states that his PCP told him that he has a hematoma that needs to be assessed. Patient offered appts for today or tomorrow. Patient preferred appt for tomorrow at 2pm. Patient aware that appt will be at [facility].

## 2015-09-13 NOTE — Telephone Encounter (Signed)
Pt called in stating that he went to a f/u visit with his PCP and he noticed some swelling around the area of where his device was placed. He would like to speak with someone about this. Please f/u with him  Thanks

## 2015-09-14 ENCOUNTER — Ambulatory Visit (INDEPENDENT_AMBULATORY_CARE_PROVIDER_SITE_OTHER): Payer: Medicare Other | Admitting: *Deleted

## 2015-09-14 DIAGNOSIS — I482 Chronic atrial fibrillation: Secondary | ICD-10-CM

## 2015-09-14 DIAGNOSIS — I4821 Permanent atrial fibrillation: Secondary | ICD-10-CM

## 2015-09-14 NOTE — Progress Notes (Signed)
Patient presents to clinic for hematoma eval s/p ppm gen change on 12/6. Moderate sized hematoma noted with small amount of red/purple ecchymosis. Wound was assessed by Dr.Taylor who recommended that the patient consume a small amount of green, leafy vegetables and hold his ASA until his wound check next week. Patient voiced understanding and plans to f/u as scheduled.

## 2015-09-19 ENCOUNTER — Encounter: Payer: Self-pay | Admitting: Cardiovascular Disease

## 2015-09-19 ENCOUNTER — Ambulatory Visit (INDEPENDENT_AMBULATORY_CARE_PROVIDER_SITE_OTHER): Payer: Medicare Other | Admitting: *Deleted

## 2015-09-19 DIAGNOSIS — Z950 Presence of cardiac pacemaker: Secondary | ICD-10-CM

## 2015-09-19 DIAGNOSIS — I442 Atrioventricular block, complete: Secondary | ICD-10-CM

## 2015-09-19 LAB — CUP PACEART INCLINIC DEVICE CHECK
Battery Impedance: 100 Ohm
Brady Statistic RV Percent Paced: 100 %
Implantable Lead Implant Date: 20080311
Implantable Lead Implant Date: 20080311
Implantable Lead Location: 753859
Implantable Lead Model: 5076
Lead Channel Impedance Value: 67 Ohm
Lead Channel Setting Pacing Amplitude: 2.25 V
Lead Channel Setting Pacing Pulse Width: 0.4 ms
Lead Channel Setting Sensing Sensitivity: 2.8 mV
MDC IDC LEAD LOCATION: 753860
MDC IDC MSMT BATTERY REMAINING LONGEVITY: 166 mo
MDC IDC MSMT BATTERY VOLTAGE: 2.8 V
MDC IDC MSMT LEADCHNL RV IMPEDANCE VALUE: 713 Ohm
MDC IDC MSMT LEADCHNL RV PACING THRESHOLD AMPLITUDE: 1 V
MDC IDC MSMT LEADCHNL RV PACING THRESHOLD PULSEWIDTH: 0.4 ms
MDC IDC SESS DTM: 20161214122339

## 2015-09-19 NOTE — Progress Notes (Signed)
Wound check appointment s/p pacemaker generator replacement. Staples removed. Wound without redness or edema. Incision edges approximated, wound well healed. Stable hematoma and bruising noted at device pocket-- pt reports that the swelling has not increased in size over time, instructed to call if swelling worsened. Normal device function. Threshold, sensing, and impedance consistent with implant measurements.  Histogram distribution appropriate for patient and level of activity. No  high ventricular rates noted. Patient educated about wound care, arm mobility, lifting restrictions. ROV with [facility] 12/11/15.

## 2015-09-24 ENCOUNTER — Telehealth: Payer: Self-pay | Admitting: Cardiovascular Disease

## 2015-09-24 NOTE — Telephone Encounter (Signed)
Spoke w/ Mike Price and gave recommendations regarding his medications. He is aware of instructions.  He has coumadin visit sched for 12/30 and would like to know if a recommendation to be seen earlier. Will route to [place] to advise.

## 2015-09-24 NOTE — Telephone Encounter (Signed)
I would not recommend that he start back on ASA.  In fact, I do not think that he needs ASA long term while on anticoagulation.

## 2015-09-24 NOTE — Telephone Encounter (Signed)
Pt is returning Nathan's call from earlier . Please f/u  Thanks

## 2015-09-24 NOTE — Telephone Encounter (Signed)
Hematoma from pacemaker changeout, will stop aspirin.  Advised to keep warfarin appt on Dec 30

## 2015-09-24 NOTE — Telephone Encounter (Signed)
Mike Price is calling because he is wanting to know if he should go back on his aspirin . He is also on blood thinners . Please call   Thanks

## 2015-09-24 NOTE — Telephone Encounter (Signed)
Returned call. Pt explains he was seen for wound check 12/9, had noted hematoma. Pt was recommended to hold aspirin and eat leafy greens but to continue warfarin.  At wound recheck on 12/14 hematoma appeared stable. Pt has assessed, his best determination is that the hematoma has been unchanged for past few days, but not worse. Wants advice on whether OK to resume his 81mg  ASA. Pt aware I will route for recommendation.

## 2015-09-26 ENCOUNTER — Ambulatory Visit: Payer: Medicare Other

## 2015-10-05 ENCOUNTER — Ambulatory Visit (INDEPENDENT_AMBULATORY_CARE_PROVIDER_SITE_OTHER): Payer: Medicare Other | Admitting: Pharmacist Clinician (PhC)/ Clinical Pharmacy Specialist

## 2015-10-05 DIAGNOSIS — Z7901 Long term (current) use of anticoagulants: Secondary | ICD-10-CM

## 2015-10-05 DIAGNOSIS — I4891 Unspecified atrial fibrillation: Secondary | ICD-10-CM

## 2015-10-05 LAB — POCT INR: INR: 2.4

## 2015-10-11 ENCOUNTER — Encounter: Payer: Self-pay | Admitting: Physical Therapy

## 2015-10-11 NOTE — Therapy (Signed)
[facility] [address] [place], [place], [address] Phone: [phone]   Fax:  [phone]  Patient Details  Name: Mike Price MRN: [id number] Date of Birth: 11/20/1934 Referring Provider:  No ref. provider found  Encounter Date: 10/11/2015  PHYSICAL THERAPY DISCHARGE SUMMARY  Visits from Start of Care: 22  Current functional level related to goals / functional outcomes: PT LONG TERM GOAL #1    Title verbalizes / demonstrates understanding of fitness plan /ongoing HEP. (Target Date: 02/02/15)   Baseline 03/02/15: Planning on silver sneakers post rehab.   Status Achieved  Plans to use silver sneakers and work out at [employer]   PT LONG TERM GOAL #2   Title ambulates 300' with LRAD safely modified independent. (Target Date: 02/02/15)   Status Achieved   PT LONG TERM GOAL #3   Title negotiates ramp, curb & stairs (1 rail) with LRAD modified independent. (Target Date: 03/02/15)   Baseline 03/02/15: needs 1 rail/cane on stairs mod I, supervision with ramp/curb with cane   Status Partially Met  Supervision on 01/30/15   PT LONG TERM GOAL #4   Title Berg Balance >32/ 56 (Target Date: 02/02/15)   Baseline 4/7- 46/56 score, see new BERG goal below.   Status Revised  43/56   PT LONG TERM GOAL #5   Title Timed Up & Go with & without cane <13.5 seconds (Target Date: 03/02/15)   Baseline 02/27/15- 13.47 sec's with cane; 03/02/15: without cane 14.63 sec's   Time 60   Period Days   Status Partially Met   PT LONG TERM GOAL #6   Title Pt will improve BERG score to >/=47/56 to decrease falls risk. Target date: 03/02/15.   Baseline 02/27/15: scored 49/56.   Status Achieved                Remaining deficits: See above   Education / Equipment: HEP  Plan: Patient agrees to discharge.  Patient goals were partially met. Patient is being discharged due to  not returning since the last visit.  ?????       WALDRON,ROBIN  PT, DPT  10/11/2015, 1:08 PM  [facility] [address] [place], [place], [address] Phone: [phone]   Fax:  [phone] 

## 2015-11-02 ENCOUNTER — Ambulatory Visit (INDEPENDENT_AMBULATORY_CARE_PROVIDER_SITE_OTHER): Payer: Medicare Other | Admitting: Pharmacist Clinician (PhC)/ Clinical Pharmacy Specialist

## 2015-11-02 DIAGNOSIS — I4891 Unspecified atrial fibrillation: Secondary | ICD-10-CM

## 2015-11-02 DIAGNOSIS — Z7901 Long term (current) use of anticoagulants: Secondary | ICD-10-CM

## 2015-11-02 LAB — POCT INR: INR: 3

## 2015-11-27 ENCOUNTER — Encounter: Payer: Self-pay | Admitting: Cardiovascular Disease

## 2015-11-27 ENCOUNTER — Ambulatory Visit (INDEPENDENT_AMBULATORY_CARE_PROVIDER_SITE_OTHER): Payer: Medicare Other | Admitting: Cardiovascular Disease

## 2015-11-27 VITALS — BP 132/76 | HR 80 | Ht 71.0 in | Wt 254.0 lb

## 2015-11-27 DIAGNOSIS — I482 Chronic atrial fibrillation: Secondary | ICD-10-CM

## 2015-11-27 DIAGNOSIS — I10 Essential (primary) hypertension: Secondary | ICD-10-CM | POA: Diagnosis not present

## 2015-11-27 DIAGNOSIS — I4821 Permanent atrial fibrillation: Secondary | ICD-10-CM

## 2015-11-27 NOTE — Patient Instructions (Signed)
Medication Instructions:  Your physician recommends that you continue on your current medications as directed. Please refer to the Current Medication list given to you today.   Labwork: I will get your lab work from your Primary Care Physician.   Testing/Procedures: Your physician has requested that you have a lower extremity arterial doppler- During this test, ultrasound is used to evaluate arterial blood flow in the legs. Allow approximately one hour for this exam.    Follow-Up: Your physician wants you to follow-up in: 12 months with Dr. Berry. You will receive a reminder letter in the mail two months in advance. If you don't receive a letter, please call our office to schedule the follow-up appointment.   Any Other Special Instructions Will Be Listed Below (If Applicable).     If you need a refill on your cardiac medications before your next appointment, please call your pharmacy.   

## 2015-11-27 NOTE — Assessment & Plan Note (Signed)
History of hypertension blood pressure measures 132/76. He is on losartan and Lopressor. Continue current meds at current dosing

## 2015-11-27 NOTE — Assessment & Plan Note (Signed)
History of atrial fibrillation status post permanent transvenous pacemaker insertion with recent generator change by Dr. Sallyanne Kuster 09/11/15 on Coumadin anticoagulation.

## 2015-11-27 NOTE — Assessment & Plan Note (Signed)
History of coronary artery disease status post coronary artery bypass grafting in 2001. His last cardiac catheterization performed by Dr. Melvern Banker 03/28/08 revealed patent grafts with normal LV function. He had a Myoview stress test performed 05/25/14 which is low risk and nonischemic. He denies chest pain or shortness of breath.

## 2015-11-27 NOTE — Progress Notes (Addendum)
11/27/2015 HELTON BONSER   April 11, 1935  [id number]  Primary Physician GREEN, [name], MD Primary Cardiologist: Lorretta Harp MD [name]   HPI:  The patient is a 81 year old moderately overweight married Caucasian male father of 1 child, grandfather to 27 step-grandchildren who I last saw 11/07/14. He has a history of coronary artery bypass grafting in 2001. He had a permanent transvenous pacemaker placed for PAF and underwent cardioversion by Dr. Tami Ribas January 24, 2008. His other problems include hypertension and hyperlipidemia. He was cathed by Dr. Melvern Banker March 28, 2008 revealing patent grafts with normal LV function. 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 He denies chest pain or shortness of breath. He had a left total hip replacement in October of 2011. Myoview performed a year ago was nonischemic.several months he's noticed increasing dyspnea on exertion. He saw Dr. Nyoka Cowden, his primary care physician, who referred him back for further evaluation. Apparently shortness of breath as his presenting symptom prior to coronary artery bypass grafting.a Myoview stress test performed 05/25/14 was low risk and nonischemic.  He is on C Pap for obstructive sleep apnea. Since I saw him a year ago he underwent permanent transvenous pacemaker generator change out by Dr. Sallyanne Kuster  09/11/15. During our conversation, patient described BLE discomfort with some qualities c/w claudication.   Current Outpatient Prescriptions  Medication Sig Dispense Refill  . acetaminophen (TYLENOL) 500 MG tablet Take 1,000 mg by mouth every 6 (six) hours as needed.    [name] Kitchen aspirin 81 MG tablet Take 81 mg by mouth daily.    [name] Kitchen atorvastatin (LIPITOR) 40 MG tablet Take 40 mg by mouth every evening.     . bisacodyl (DULCOLAX) 10 MG suppository Place 1 suppository (10 mg total) rectally daily as needed. 12 suppository 0  . clonazePAM (KLONOPIN)  0.5 MG tablet Take 0.25-0.5 mg by mouth 3 (three) times daily as needed for anxiety. Takes 1/2 tablet twice daily then a whole tablet at bedtime    . doxazosin (CARDURA) 2 MG tablet Take 1 mg by mouth at bedtime.    [name] Kitchen ezetimibe (ZETIA) 10 MG tablet Take 10 mg by mouth every evening.     . ferrous sulfate 325 (65 FE) MG tablet Take 325 mg by mouth every other day. On Monday, Wednesday and Friday    . furosemide (LASIX) 20 MG tablet Take 10 mg by mouth daily.     [name] Kitchen gabapentin (NEURONTIN) 100 MG capsule Take 1 capsule (100 mg total) by mouth 3 (three) times daily. 270 capsule 1  . losartan (COZAAR) 100 MG tablet Take 100 mg by mouth daily.    . metoprolol (LOPRESSOR) 100 MG tablet Take 100 mg by mouth 2 (two) times daily.    . NON FORMULARY CPAP qhs    . omeprazole (PRILOSEC) 20 MG capsule Take 20 mg by mouth 2 (two) times daily.    . Skin Protectants, Misc. (EUCERIN) cream Apply 1 application topically daily as needed for dry skin.    [name] Kitchen SYNTHROID 88 MCG tablet Take 88 mcg by mouth daily.    [name] Kitchen warfarin (COUMADIN) 5 MG tablet Take 1 to 1.5 tablets by mouth daily as directed by coumadin clinic 120 tablet 1   No current facility-administered medications for this visit.    Allergies  Allergen Reactions  . Lisinopril Cough  . Mefloquine Other (See Comments)    "made me crazy" anxious, upset    Social History   Social  History  . Marital Status: Married    Spouse Name: Faith  . Number of Children: N/A  . Years of Education: N/A   Occupational History  . Priest     Social History Main Topics  . Smoking status: Former Smoker    Quit date: 01/12/1984  . Smokeless tobacco: Not on file  . Alcohol Use: 1.8 oz/week    3 Shots of liquor per week     Comment:  3 ounces daily  . Drug Use: No  . Sexual Activity: Not on file   Other Topics Concern  . Not on file   Social History Narrative   Lives at home with wife, Faith   Caffeine use: 2 cups coffee per day        Review of  Systems: General: negative for chills, fever, night sweats or weight changes.  Cardiovascular: negative for chest pain, dyspnea on exertion, edema, orthopnea, palpitations, paroxysmal nocturnal dyspnea or shortness of breath Dermatological: negative for rash Respiratory: negative for cough or wheezing Urologic: negative for hematuria Abdominal: negative for nausea, vomiting, diarrhea, bright red blood per rectum, melena, or hematemesis Neurologic: negative for visual changes, syncope, or dizziness All other systems reviewed and are otherwise negative except as noted above.    Blood pressure 132/76, pulse 80, height 5\' 11"  (1.803 m), weight 254 lb (115.214 kg).  General appearance: alert and no distress Neck: no adenopathy, no carotid bruit, no JVD, supple, symmetrical, trachea midline and thyroid not enlarged, symmetric, no tenderness/mass/nodules Lungs: clear to auscultation bilaterally Heart: regular rate and rhythm, S1, S2 normal, no murmur, click, rub or gallop Extremities: extremities normal, atraumatic, no cyanosis or edema  EKG a ventricular paced rhythm at 80. I personally reviewed this EKG  ASSESSMENT AND PLAN:   Atrial fibrillation History of atrial fibrillation status post permanent transvenous pacemaker insertion with recent generator change by Dr. Sallyanne Kuster 09/11/15 on Coumadin anticoagulation.  CAD (coronary artery disease) of artery bypass graft History of coronary artery disease status post coronary artery bypass grafting in 2001. His last cardiac catheterization performed by Dr. Melvern Banker 03/28/08 revealed patent grafts with normal LV function. He had a Myoview stress test performed 05/25/14 which is low risk and nonischemic. He denies chest pain or shortness of breath.  HTN (hypertension) History of hypertension blood pressure measures 132/76. He is on losartan and Lopressor. Continue current meds at current dosing      Lorretta Harp MD [facility],  [facility] 11/27/2015 3:28 PM

## 2015-11-29 ENCOUNTER — Telehealth: Payer: Self-pay | Admitting: Cardiovascular Disease

## 2015-11-29 NOTE — Telephone Encounter (Signed)
New message      Talk to the nurse regarding test ordered for next week.  He thinks he has had one of the test ordered already.  Please call

## 2015-11-29 NOTE — Telephone Encounter (Signed)
Spoke with pt, questions answered regarding lab work to be done that printed on his AVS.

## 2015-12-05 ENCOUNTER — Ambulatory Visit (HOSPITAL_COMMUNITY)
Admission: RE | Admit: 2015-12-05 | Discharge: 2015-12-05 | Disposition: A | Discharge status: Home / Self Care | Payer: Medicare Other | Source: Ambulatory Visit | Attending: Cardiology | Admitting: Cardiology

## 2015-12-05 ENCOUNTER — Other Ambulatory Visit: Payer: Self-pay | Admitting: Cardiovascular Disease

## 2015-12-05 DIAGNOSIS — M79606 Pain in leg, unspecified: Secondary | ICD-10-CM | POA: Diagnosis not present

## 2015-12-05 DIAGNOSIS — E785 Hyperlipidemia, unspecified: Secondary | ICD-10-CM | POA: Insufficient documentation

## 2015-12-05 DIAGNOSIS — M79604 Pain in right leg: Secondary | ICD-10-CM

## 2015-12-05 DIAGNOSIS — I4821 Permanent atrial fibrillation: Secondary | ICD-10-CM

## 2015-12-05 DIAGNOSIS — M79605 Pain in left leg: Secondary | ICD-10-CM | POA: Diagnosis not present

## 2015-12-05 DIAGNOSIS — I739 Peripheral vascular disease, unspecified: Secondary | ICD-10-CM | POA: Diagnosis present

## 2015-12-05 DIAGNOSIS — I10 Essential (primary) hypertension: Secondary | ICD-10-CM | POA: Diagnosis not present

## 2015-12-06 ENCOUNTER — Telehealth: Payer: Self-pay

## 2015-12-06 NOTE — Telephone Encounter (Signed)
During pt appt 2/21 pt mentioned to MD he was having problems with leg pain at rest and increasing with activity. MD ordered LEA, on arrival for testing reviewed concerns with pt as current diagnosis were unrelated to reason for test. After confirming with pt his concerns and reasons for test, test were completed.

## 2015-12-08 NOTE — Telephone Encounter (Signed)
Done  JJB

## 2015-12-11 ENCOUNTER — Ambulatory Visit (INDEPENDENT_AMBULATORY_CARE_PROVIDER_SITE_OTHER): Payer: Medicare Other | Admitting: Cardiovascular Disease

## 2015-12-11 ENCOUNTER — Encounter: Payer: Self-pay | Admitting: Cardiovascular Disease

## 2015-12-11 ENCOUNTER — Ambulatory Visit (INDEPENDENT_AMBULATORY_CARE_PROVIDER_SITE_OTHER): Payer: Medicare Other | Admitting: Pharmacist Clinician (PhC)/ Clinical Pharmacy Specialist

## 2015-12-11 VITALS — BP 138/77 | HR 80 | Ht 71.0 in | Wt 257.5 lb

## 2015-12-11 DIAGNOSIS — I442 Atrioventricular block, complete: Secondary | ICD-10-CM

## 2015-12-11 DIAGNOSIS — I4891 Unspecified atrial fibrillation: Secondary | ICD-10-CM

## 2015-12-11 DIAGNOSIS — Z950 Presence of cardiac pacemaker: Secondary | ICD-10-CM

## 2015-12-11 DIAGNOSIS — I25708 Atherosclerosis of coronary artery bypass graft(s), unspecified, with other forms of angina pectoris: Secondary | ICD-10-CM | POA: Diagnosis not present

## 2015-12-11 DIAGNOSIS — I4821 Permanent atrial fibrillation: Secondary | ICD-10-CM

## 2015-12-11 DIAGNOSIS — I482 Chronic atrial fibrillation: Secondary | ICD-10-CM | POA: Diagnosis not present

## 2015-12-11 DIAGNOSIS — Z7901 Long term (current) use of anticoagulants: Secondary | ICD-10-CM | POA: Diagnosis not present

## 2015-12-11 LAB — POCT INR: INR: 2.8

## 2015-12-11 NOTE — Progress Notes (Signed)
Patient ID: SAHAS BERRIEN, male   DOB: October 26, 1934, 81 y.o.   MRN: [id number]    Cardiology Office Note    Date:  12/13/2015   ID:  Mike Price, DOB [birth year], MRN [id number]  PCP:  Criselda Peaches, MD  Cardiologist:  Quay Burow, MD;  Sanda Klein, MD   Chief Complaint  Patient presents with  . Follow-up    no chest pain, has shortness of breath, occassional edema, has pain & cramping in legs, no lightheadedness or dizziness, has fatigue    History of Present Illness:  Mike Price is a 81 y.o. male with CAD s/p CABG 2001 (patent grafts 2008, low risk nuclear study 2015), complete heart block s/p PPM (2008, generator change December 2016 - Medtronic Adapta), permanent atrial fibrillation and hyperlipidemia. He is anticoagulated with warfarin, without CVA/TIA or bleeding problems.  His PM is functioning normally, good lead parameters, longevity 13.5 years, 99.7% V paced, no episodes of high ventricular rate.   He denies any CV complaints, syncope, falls, dyspnea, but is limited by bilateral knee pain (L>R). He is trying to avoid joint replacement.  Past Medical History  Diagnosis Date  . Hypertension   . Thyroid disease   . TMJ tenderness 01-11-13    right side-has issues from time to time.  . Pacemaker 12/15/06    3'08  . Dysrhythmia 01-11-13     hx. A. Fib-Pacemaker implanted left chest  . Hypothyroidism   . Anxiety 01-11-13    tx. Clonazepam.  . Edema extremities 01-11-13    retains fluid in legs-right greater than left.  . Neuromuscular disorder ([place])     neuropathy in feet  . GERD (gastroesophageal reflux disease)   . Arthritis     Osteoarthritis-knees, fingers  . Anemia   . CAD (coronary artery disease)   . Hyperlipemia   . Obstructive sleep apnea     on C Pap  [name] 07/10/2015    IgA 542 mg% 06/19/15. IgA lambda paraprotein on IFE, normal IgG & IgM    Past Surgical History  Procedure Laterality Date  .  Coronary artery bypass graft  2001    4 vessels-'00  . Tonsillectomy      child  . Appendectomy    . Insert / replace / remove pacemaker      '08-inserted left chest  . Cervical laminectomy    . Hernia repair    . Joint replacement      LTHA  . Total knee arthroplasty Right 01/17/2013    Procedure: RIGHT TOTAL KNEE ARTHROPLASTY;  Surgeon: Gearlean Alf, MD;  Location: [facility];  Service: Orthopedics;  Laterality: Right;  . Cardiac catheterization  03/28/08    patent grafts, nl EF  . Pacemaker insertion  12/15/06    medtronic  . US echocardiography  12/28/2007    LA mod. dilated,RA mildly dilated  . Nm myocar perf wall motion  02/12/2012    small fixed distal anteroapical/apical defect. No reversible ischemia  . Ep implantable device N/A 09/11/2015    Procedure: PPM Generator Changeout;  Surgeon: Sanda Klein, MD;  Location: [place] [facility];  Service: Cardiovascular;  Laterality: N/A;    Outpatient Prescriptions Prior to Visit  Medication Sig Dispense Refill  . acetaminophen (TYLENOL) 500 MG tablet Take 1,000 mg by mouth every 6 (six) hours as needed.    [name] Kitchen atorvastatin (LIPITOR) 40 MG tablet Take 40 mg by mouth every evening.     [name] Kitchen  bisacodyl (DULCOLAX) 10 MG suppository Place 1 suppository (10 mg total) rectally daily as needed. 12 suppository 0  . clonazePAM (KLONOPIN) 0.5 MG tablet Take 0.25-0.5 mg by mouth 3 (three) times daily as needed for anxiety. Takes 1/2 tablet twice daily then a whole tablet at bedtime    . doxazosin (CARDURA) 2 MG tablet Take 1 mg by mouth at bedtime.    [name] Kitchen ezetimibe (ZETIA) 10 MG tablet Take 10 mg by mouth every evening.     . ferrous sulfate 325 (65 FE) MG tablet Take 325 mg by mouth every other day. On Monday, Wednesday and Friday    . furosemide (LASIX) 20 MG tablet Take 10 mg by mouth daily.     [name] Kitchen gabapentin (NEURONTIN) 100 MG capsule Take 1 capsule (100 mg total) by mouth 3 (three) times daily. 270 capsule 1  . losartan (COZAAR) 100 MG tablet Take 100  mg by mouth daily.    . metoprolol (LOPRESSOR) 100 MG tablet Take 100 mg by mouth 2 (two) times daily.    . NON FORMULARY CPAP qhs    . omeprazole (PRILOSEC) 20 MG capsule Take 20 mg by mouth 2 (two) times daily.    . Skin Protectants, Misc. (EUCERIN) cream Apply 1 application topically daily as needed for dry skin.    [name] Kitchen SYNTHROID 88 MCG tablet Take 88 mcg by mouth daily.    [name] Kitchen warfarin (COUMADIN) 5 MG tablet Take 1 to 1.5 tablets by mouth daily as directed by coumadin clinic 120 tablet 1   No facility-administered medications prior to visit.     Allergies:   Lisinopril and Mefloquine   Social History   Social History  . Marital Status: Married    Spouse Name: Faith  . Number of Children: N/A  . Years of Education: N/A   Occupational History  . Priest     Social History Main Topics  . Smoking status: Former Smoker    Quit date: 01/12/1984  . Smokeless tobacco: None  . Alcohol Use: 1.8 oz/week    3 Shots of liquor per week     Comment:  3 ounces daily  . Drug Use: No  . Sexual Activity: Not Asked   Other Topics Concern  . None   Social History Narrative   Lives at home with wife, Faith   Caffeine use: 2 cups coffee per day        ROS:   Please see the history of present illness.    ROS All other systems reviewed and are negative.   PHYSICAL EXAM:   VS:  BP 138/77 mmHg  Pulse 80  Ht 5\' 11"  (1.803 m)  Wt 116.801 kg (257 lb 8 oz)  BMI 35.93 kg/m2   GEN: Well nourished, well developed, in no acute distress HEENT: normal Neck: no JVD, carotid bruits, or masses Cardiac: paradoxically split S2, RRR; no murmurs, rubs, or gallops,no edema , well healed pacemaker site Respiratory:  clear to auscultation bilaterally, normal work of breathing GI: soft, nontender, nondistended, + BS MS: no deformity or atrophy Skin: warm and dry, no rash Neuro:  Alert and Oriented x 3, Strength and sensation are intact Psych: euthymic mood, full affect  Wt Readings from Last 3  Encounters:  12/11/15 116.801 kg (257 lb 8 oz)  11/27/15 115.214 kg (254 lb)  09/11/15 113.399 kg (250 lb)      Studies/Labs Reviewed:   EKG:  EKG is not ordered today.  Recent Labs: 09/04/2015: ALT 20; BUN 16;  Creat 0.75; Hemoglobin 14.4; Platelets 147*; Potassium 4.7; Sodium 137     ASSESSMENT:    1. CHB (complete heart block) (HCC)   2. Pacemaker   3. Coronary artery disease involving coronary bypass graft of native heart with other forms of angina pectoris ([place])   4. Permanent atrial fibrillation ([place])   5. Long term current use of anticoagulant therapy      PLAN:  In order of problems listed above:  1. CHB: Occasional native AV conduction, but has 100% V pacing and is best considered pacemaker dependent. 2. PPM: Normal device function; Carelink downloads Q 3 mos, office visit yearly. 3. CAD s/p CABG: asymptomatic on current meds 4. AFib:  No high V rates. CHADSVasc 4 (age 69, CAD, HTN).\ 5. Warfarin: No bleeding complications. Had one serious fall on his face in a grassy spot, luckily without serious injury    Medication Adjustments/Labs and Tests Ordered: Current medicines are reviewed at length with the patient today.  Concerns regarding medicines are outlined above.  Medication changes, Labs and Tests ordered today are listed in the Patient Instructions below. Patient Instructions  Remote monitoring is used to monitor your Pacemaker from home. This monitoring reduces the number of office visits required to check your device to one time per year. It allows [place] to monitor the functioning of your device to ensure it is working properly. You are scheduled for a device check from home on March 13, 2016. You may send your transmission at any time that day. If you have a wireless device, the transmission will be sent automatically. After your physician reviews your transmission, you will receive a postcard with your next transmission date.  Dr. Sallyanne Kuster recommends that you  schedule a follow-up appointment in: [place] ([facility])         Mike Spray, MD  12/13/2015 12:42 PM    [facility] [address], [place], [place]  [address] Phone: [phone]; Fax: [phone]

## 2015-12-11 NOTE — Patient Instructions (Signed)
Remote monitoring is used to monitor your Pacemaker from home. This monitoring reduces the number of office visits required to check your device to one time per year. It allows [place] to monitor the functioning of your device to ensure it is working properly. You are scheduled for a device check from home on March 13, 2016. You may send your transmission at any time that day. If you have a wireless device, the transmission will be sent automatically. After your physician reviews your transmission, you will receive a postcard with your next transmission date.  Dr. Sallyanne Kuster recommends that you schedule a follow-up appointment in: [place] ([facility])

## 2015-12-28 LAB — CUP PACEART INCLINIC DEVICE CHECK
Battery Remaining Longevity: 165 mo
Date Time Interrogation Session: 20170307160644
Implantable Lead Implant Date: 20080311
Implantable Lead Model: 5076
Implantable Lead Model: 5076
Lead Channel Impedance Value: 67 Ohm
Lead Channel Impedance Value: 709 Ohm
MDC IDC LEAD IMPLANT DT: 20080311
MDC IDC LEAD LOCATION: 753859
MDC IDC LEAD LOCATION: 753860
MDC IDC MSMT BATTERY IMPEDANCE: 100 Ohm
MDC IDC MSMT BATTERY VOLTAGE: 2.8 V
MDC IDC SET LEADCHNL RV PACING AMPLITUDE: 2.25 V
MDC IDC SET LEADCHNL RV PACING PULSEWIDTH: 0.4 ms
MDC IDC SET LEADCHNL RV SENSING SENSITIVITY: 2.8 mV
MDC IDC STAT BRADY RV PERCENT PACED: 100 %

## 2016-01-22 ENCOUNTER — Encounter: Payer: Medicare Other | Admitting: Pharmacist Clinician (PhC)/ Clinical Pharmacy Specialist

## 2016-01-25 ENCOUNTER — Ambulatory Visit (INDEPENDENT_AMBULATORY_CARE_PROVIDER_SITE_OTHER): Payer: Medicare Other | Admitting: Pharmacist Clinician (PhC)/ Clinical Pharmacy Specialist

## 2016-01-25 DIAGNOSIS — I482 Chronic atrial fibrillation: Secondary | ICD-10-CM | POA: Diagnosis not present

## 2016-01-25 DIAGNOSIS — I4891 Unspecified atrial fibrillation: Secondary | ICD-10-CM | POA: Diagnosis not present

## 2016-01-25 DIAGNOSIS — Z7901 Long term (current) use of anticoagulants: Secondary | ICD-10-CM

## 2016-01-25 DIAGNOSIS — I4821 Permanent atrial fibrillation: Secondary | ICD-10-CM

## 2016-01-25 LAB — POCT INR: INR: 3.5

## 2016-01-29 ENCOUNTER — Telehealth: Payer: Self-pay | Admitting: Cardiovascular Disease

## 2016-01-29 NOTE — Telephone Encounter (Signed)
New Message  Pt request a call back to discuss receiving the permanent card for his device that he carries. He says that he still has the temp card and would like the permanent call.

## 2016-01-31 ENCOUNTER — Telehealth: Payer: Self-pay | Admitting: Cardiology

## 2016-01-31 NOTE — Telephone Encounter (Signed)
opene

## 2016-01-31 NOTE — Telephone Encounter (Signed)
Returned patient's call.  Instructed him to either go to [email or web address] and click on the "Patients & Caregivers" link, then the "Update Device Registration" link, or to call [phone].  Patient verbalizes understanding and is appreciative of call.  He denies additional questions or concerns at this time.

## 2016-01-31 NOTE — Telephone Encounter (Signed)
Opened in error

## 2016-02-08 ENCOUNTER — Ambulatory Visit (INDEPENDENT_AMBULATORY_CARE_PROVIDER_SITE_OTHER): Payer: Medicare Other | Admitting: Pharmacist Clinician (PhC)/ Clinical Pharmacy Specialist

## 2016-02-08 DIAGNOSIS — I4821 Permanent atrial fibrillation: Secondary | ICD-10-CM

## 2016-02-08 DIAGNOSIS — Z7901 Long term (current) use of anticoagulants: Secondary | ICD-10-CM

## 2016-02-08 DIAGNOSIS — I4891 Unspecified atrial fibrillation: Secondary | ICD-10-CM | POA: Diagnosis not present

## 2016-02-08 DIAGNOSIS — I482 Chronic atrial fibrillation: Secondary | ICD-10-CM

## 2016-02-08 LAB — POCT INR: INR: 3.2

## 2016-02-15 ENCOUNTER — Telehealth: Payer: Self-pay | Admitting: Pharmacist Clinician (PhC)/ Clinical Pharmacy Specialist

## 2016-02-15 NOTE — Telephone Encounter (Signed)
Pt to take 1 tablet today instead of 1/2 tablet, then resume previous dose.  Wife voiced understanding

## 2016-02-15 NOTE — Telephone Encounter (Signed)
New message      Pt forgot to take his coumadin last night.  Please advise

## 2016-02-22 ENCOUNTER — Encounter: Payer: Medicare Other | Admitting: Pharmacist Clinician (PhC)/ Clinical Pharmacy Specialist

## 2016-02-25 ENCOUNTER — Ambulatory Visit (INDEPENDENT_AMBULATORY_CARE_PROVIDER_SITE_OTHER): Payer: Medicare Other | Admitting: Pharmacist

## 2016-02-25 DIAGNOSIS — I4821 Permanent atrial fibrillation: Secondary | ICD-10-CM

## 2016-02-25 DIAGNOSIS — I4891 Unspecified atrial fibrillation: Secondary | ICD-10-CM | POA: Diagnosis not present

## 2016-02-25 DIAGNOSIS — I482 Chronic atrial fibrillation: Secondary | ICD-10-CM | POA: Diagnosis not present

## 2016-02-25 DIAGNOSIS — Z7901 Long term (current) use of anticoagulants: Secondary | ICD-10-CM

## 2016-02-25 LAB — POCT INR: INR: 3

## 2016-03-13 ENCOUNTER — Ambulatory Visit (INDEPENDENT_AMBULATORY_CARE_PROVIDER_SITE_OTHER): Payer: Medicare Other | Admitting: *Deleted

## 2016-03-13 DIAGNOSIS — I442 Atrioventricular block, complete: Secondary | ICD-10-CM | POA: Diagnosis not present

## 2016-03-13 NOTE — Progress Notes (Signed)
Remote pacemaker transmission.   

## 2016-03-17 ENCOUNTER — Ambulatory Visit (INDEPENDENT_AMBULATORY_CARE_PROVIDER_SITE_OTHER): Payer: Medicare Other | Admitting: Pharmacist

## 2016-03-17 DIAGNOSIS — I4821 Permanent atrial fibrillation: Secondary | ICD-10-CM

## 2016-03-17 DIAGNOSIS — I482 Chronic atrial fibrillation: Secondary | ICD-10-CM | POA: Diagnosis not present

## 2016-03-17 DIAGNOSIS — Z7901 Long term (current) use of anticoagulants: Secondary | ICD-10-CM | POA: Diagnosis not present

## 2016-03-17 DIAGNOSIS — I4891 Unspecified atrial fibrillation: Secondary | ICD-10-CM

## 2016-03-17 LAB — POCT INR: INR: 3.1

## 2016-03-21 LAB — CUP PACEART REMOTE DEVICE CHECK
Battery Impedance: 100 Ohm
Battery Voltage: 2.79 V
Brady Statistic RV Percent Paced: 100 %
Implantable Lead Implant Date: 20080311
Implantable Lead Location: 753860
Lead Channel Setting Pacing Amplitude: 2.25 V
Lead Channel Setting Pacing Pulse Width: 0.4 ms
Lead Channel Setting Sensing Sensitivity: 2.8 mV
MDC IDC LEAD IMPLANT DT: 20080311
MDC IDC LEAD LOCATION: 753859
MDC IDC MSMT BATTERY REMAINING LONGEVITY: 166 mo
MDC IDC MSMT LEADCHNL RA IMPEDANCE VALUE: 67 Ohm
MDC IDC MSMT LEADCHNL RV IMPEDANCE VALUE: 740 Ohm
MDC IDC SESS DTM: 20170608134358

## 2016-03-26 ENCOUNTER — Encounter: Payer: Self-pay | Admitting: Cardiology

## 2016-03-26 ENCOUNTER — Telehealth: Payer: Self-pay | Admitting: Cardiovascular Disease

## 2016-03-26 NOTE — Telephone Encounter (Signed)
New message      Pt is on coumadin.  Can he take ciprofloxacin 500mg ?

## 2016-03-26 NOTE — Telephone Encounter (Signed)
Returned call, patient started on ciprofloxacin 500 mg and metronidazole 500 mg today,  for suspected diverticulitis.   INR appt set for Friday.

## 2016-03-28 ENCOUNTER — Ambulatory Visit (INDEPENDENT_AMBULATORY_CARE_PROVIDER_SITE_OTHER): Payer: Medicare Other | Admitting: Pharmacist Clinician (PhC)/ Clinical Pharmacy Specialist

## 2016-03-28 DIAGNOSIS — I482 Chronic atrial fibrillation: Secondary | ICD-10-CM | POA: Diagnosis not present

## 2016-03-28 DIAGNOSIS — Z7901 Long term (current) use of anticoagulants: Secondary | ICD-10-CM | POA: Diagnosis not present

## 2016-03-28 DIAGNOSIS — I4891 Unspecified atrial fibrillation: Secondary | ICD-10-CM | POA: Diagnosis not present

## 2016-03-28 DIAGNOSIS — I4821 Permanent atrial fibrillation: Secondary | ICD-10-CM

## 2016-03-28 LAB — POCT INR: INR: 2.1

## 2016-03-31 ENCOUNTER — Other Ambulatory Visit: Payer: Self-pay | Admitting: Pharmacist Clinician (PhC)/ Clinical Pharmacy Specialist

## 2016-03-31 MED ORDER — WARFARIN SODIUM 5 MG PO TABS: ORAL_TABLET | ORAL | Status: DC

## 2016-04-10 ENCOUNTER — Encounter: Payer: Self-pay | Admitting: Cardiology

## 2016-04-14 ENCOUNTER — Other Ambulatory Visit: Payer: Self-pay | Admitting: Neurology

## 2016-04-15 ENCOUNTER — Telehealth: Payer: Self-pay | Admitting: Cardiovascular Disease

## 2016-04-15 ENCOUNTER — Ambulatory Visit (INDEPENDENT_AMBULATORY_CARE_PROVIDER_SITE_OTHER): Payer: Medicare Other | Admitting: Pharmacist

## 2016-04-15 DIAGNOSIS — I4891 Unspecified atrial fibrillation: Secondary | ICD-10-CM

## 2016-04-15 DIAGNOSIS — I482 Chronic atrial fibrillation: Secondary | ICD-10-CM | POA: Diagnosis not present

## 2016-04-15 DIAGNOSIS — I4821 Permanent atrial fibrillation: Secondary | ICD-10-CM

## 2016-04-15 DIAGNOSIS — Z7901 Long term (current) use of anticoagulants: Secondary | ICD-10-CM

## 2016-04-15 LAB — POCT INR: INR: 3.6

## 2016-04-15 NOTE — Telephone Encounter (Deleted)
error 

## 2016-04-15 NOTE — Telephone Encounter (Signed)
Spoke w/ pt and informed him that we did receive his remote transmission. Pt verbalized understanding.

## 2016-04-15 NOTE — Telephone Encounter (Signed)
New Message  Pt call requesting to speak with RN. Pt wanted to make sure his remote transmission was received. Please call back to discuss

## 2016-05-06 ENCOUNTER — Ambulatory Visit (INDEPENDENT_AMBULATORY_CARE_PROVIDER_SITE_OTHER): Payer: Medicare Other | Admitting: Pharmacist Clinician (PhC)/ Clinical Pharmacy Specialist

## 2016-05-06 DIAGNOSIS — I482 Chronic atrial fibrillation: Secondary | ICD-10-CM | POA: Diagnosis not present

## 2016-05-06 DIAGNOSIS — I4821 Permanent atrial fibrillation: Secondary | ICD-10-CM

## 2016-05-06 DIAGNOSIS — I4891 Unspecified atrial fibrillation: Secondary | ICD-10-CM

## 2016-05-06 DIAGNOSIS — Z7901 Long term (current) use of anticoagulants: Secondary | ICD-10-CM | POA: Diagnosis not present

## 2016-05-06 LAB — POCT INR: INR: 2.2

## 2016-05-20 ENCOUNTER — Telehealth: Payer: Self-pay | Admitting: Pharmacist

## 2016-05-20 ENCOUNTER — Ambulatory Visit (HOSPITAL_COMMUNITY)
Admission: RE | Admit: 2016-05-20 | Discharge: 2016-05-20 | Disposition: A | Discharge status: Home / Self Care | Payer: Medicare Other | Source: Ambulatory Visit | Attending: Urology | Admitting: Urology

## 2016-05-20 ENCOUNTER — Ambulatory Visit (INDEPENDENT_AMBULATORY_CARE_PROVIDER_SITE_OTHER): Payer: Medicare Other | Admitting: Pharmacist

## 2016-05-20 DIAGNOSIS — K219 Gastro-esophageal reflux disease without esophagitis: Secondary | ICD-10-CM | POA: Diagnosis not present

## 2016-05-20 DIAGNOSIS — G4733 Obstructive sleep apnea (adult) (pediatric): Secondary | ICD-10-CM | POA: Diagnosis not present

## 2016-05-20 DIAGNOSIS — I2510 Atherosclerotic heart disease of native coronary artery without angina pectoris: Secondary | ICD-10-CM | POA: Diagnosis not present

## 2016-05-20 DIAGNOSIS — E785 Hyperlipidemia, unspecified: Secondary | ICD-10-CM | POA: Insufficient documentation

## 2016-05-20 DIAGNOSIS — Z7901 Long term (current) use of anticoagulants: Secondary | ICD-10-CM

## 2016-05-20 DIAGNOSIS — F419 Anxiety disorder, unspecified: Secondary | ICD-10-CM | POA: Insufficient documentation

## 2016-05-20 DIAGNOSIS — I10 Essential (primary) hypertension: Secondary | ICD-10-CM | POA: Insufficient documentation

## 2016-05-20 DIAGNOSIS — I4821 Permanent atrial fibrillation: Secondary | ICD-10-CM

## 2016-05-20 DIAGNOSIS — I482 Chronic atrial fibrillation: Secondary | ICD-10-CM | POA: Diagnosis not present

## 2016-05-20 DIAGNOSIS — E039 Hypothyroidism, unspecified: Secondary | ICD-10-CM | POA: Insufficient documentation

## 2016-05-20 DIAGNOSIS — I4891 Unspecified atrial fibrillation: Secondary | ICD-10-CM

## 2016-05-20 DIAGNOSIS — M7989 Other specified soft tissue disorders: Secondary | ICD-10-CM | POA: Diagnosis present

## 2016-05-20 DIAGNOSIS — M79604 Pain in right leg: Secondary | ICD-10-CM | POA: Diagnosis not present

## 2016-05-20 DIAGNOSIS — R600 Localized edema: Secondary | ICD-10-CM | POA: Diagnosis not present

## 2016-05-20 LAB — POCT INR: INR: 2.5

## 2016-05-20 NOTE — Telephone Encounter (Signed)
Pt called because he tripped and fell to knees last week - he saw primary care who sent him back to ortho. They X-rayed knees and no broken bones.   Right leg is deeply deeply bruised. Leg appears to be swollen and warm to touch. He feels that it has been getting worse. He has been more sedentary since due to injury and is concerned for clot.   After consultation with Dr. Gwenlyn Found will get doppler on right lower leg to rule out DVT.   Have scheduled him INR check today to ensure he remains therapeutic as well.

## 2016-05-20 NOTE — Telephone Encounter (Signed)
Order for RLE venous doppler entered in EPIC.

## 2016-05-22 ENCOUNTER — Telehealth: Payer: Self-pay | Admitting: Pharmacist

## 2016-05-22 NOTE — Telephone Encounter (Signed)
Returned call to pt about being started on Doxy 100mg  BID for 2 weeks for suspected infection in legs - reports if no improvement by next week will be admitted for IV ABX.   Rescheduled INR check for sooner due to ABX

## 2016-05-23 ENCOUNTER — Telehealth: Payer: Self-pay | Admitting: Pharmacist

## 2016-05-23 NOTE — Telephone Encounter (Signed)
Returned call about ortho appt being moved and wanting to move INR check  Ortho appt may not be moved so will keep current INR check until decision made about that appt.   Pt will call if needs to be rescheduled.

## 2016-05-26 ENCOUNTER — Telehealth: Payer: Self-pay | Admitting: Pharmacist Clinician (PhC)/ Clinical Pharmacy Specialist

## 2016-05-26 NOTE — Telephone Encounter (Signed)
Patient called, concerned that he just discovered that he missed his Friday dose of warfarin.  Per chart, he started on doxycycline bid late last week and is scheduled for INR check later this week.  Spoke with wife, advised he not take any extra doses, just keep appt.  Wife voiced understanding.

## 2016-05-29 ENCOUNTER — Ambulatory Visit (INDEPENDENT_AMBULATORY_CARE_PROVIDER_SITE_OTHER): Payer: Medicare Other | Admitting: Pharmacist

## 2016-05-29 DIAGNOSIS — Z7901 Long term (current) use of anticoagulants: Secondary | ICD-10-CM | POA: Diagnosis not present

## 2016-05-29 DIAGNOSIS — I4891 Unspecified atrial fibrillation: Secondary | ICD-10-CM | POA: Diagnosis not present

## 2016-05-29 DIAGNOSIS — I4821 Permanent atrial fibrillation: Secondary | ICD-10-CM

## 2016-05-29 DIAGNOSIS — I482 Chronic atrial fibrillation: Secondary | ICD-10-CM

## 2016-05-29 LAB — POCT INR: INR: 1.7

## 2016-06-12 ENCOUNTER — Telehealth: Payer: Self-pay | Admitting: Cardiology

## 2016-06-12 ENCOUNTER — Ambulatory Visit (INDEPENDENT_AMBULATORY_CARE_PROVIDER_SITE_OTHER): Payer: Medicare Other | Admitting: *Deleted

## 2016-06-12 DIAGNOSIS — I442 Atrioventricular block, complete: Secondary | ICD-10-CM

## 2016-06-12 NOTE — Progress Notes (Signed)
Remote pacemaker transmission.   

## 2016-06-12 NOTE — Telephone Encounter (Signed)
LMOVM reminding pt to send remote transmission.   

## 2016-06-16 ENCOUNTER — Telehealth: Payer: Self-pay | Admitting: Pharmacist

## 2016-06-16 ENCOUNTER — Encounter: Payer: Self-pay | Admitting: Cardiology

## 2016-06-16 NOTE — Telephone Encounter (Signed)
Pt called to report has eye infection and started on Eye drop and Erthyromycin to treat infection. These should not influence INR.   LM to keep upcoming appt.

## 2016-06-17 ENCOUNTER — Ambulatory Visit (INDEPENDENT_AMBULATORY_CARE_PROVIDER_SITE_OTHER): Payer: Medicare Other | Admitting: Pharmacist

## 2016-06-17 DIAGNOSIS — I482 Chronic atrial fibrillation: Secondary | ICD-10-CM | POA: Diagnosis not present

## 2016-06-17 DIAGNOSIS — I4821 Permanent atrial fibrillation: Secondary | ICD-10-CM

## 2016-06-17 DIAGNOSIS — I4891 Unspecified atrial fibrillation: Secondary | ICD-10-CM

## 2016-06-17 DIAGNOSIS — Z7901 Long term (current) use of anticoagulants: Secondary | ICD-10-CM | POA: Diagnosis not present

## 2016-06-17 LAB — POCT INR: INR: 3

## 2016-06-24 LAB — CUP PACEART REMOTE DEVICE CHECK
Battery Impedance: 112 Ohm
Battery Remaining Longevity: 162 mo
Battery Voltage: 2.79 V
Brady Statistic RV Percent Paced: 100 %
Date Time Interrogation Session: 20170907153749
Implantable Lead Implant Date: 20080311
Implantable Lead Location: 753860
Implantable Lead Model: 5076
Lead Channel Pacing Threshold Amplitude: 1.125 V
Lead Channel Setting Pacing Amplitude: 2.25 V
Lead Channel Setting Pacing Pulse Width: 0.4 ms
Lead Channel Setting Sensing Sensitivity: 2.8 mV
MDC IDC LEAD IMPLANT DT: 20080311
MDC IDC LEAD LOCATION: 753859
MDC IDC MSMT LEADCHNL RA IMPEDANCE VALUE: 67 Ohm
MDC IDC MSMT LEADCHNL RV IMPEDANCE VALUE: 740 Ohm
MDC IDC MSMT LEADCHNL RV PACING THRESHOLD PULSEWIDTH: 0.4 ms

## 2016-06-30 ENCOUNTER — Encounter: Payer: Self-pay | Admitting: Cardiology

## 2016-07-15 ENCOUNTER — Ambulatory Visit (INDEPENDENT_AMBULATORY_CARE_PROVIDER_SITE_OTHER): Payer: Medicare Other | Admitting: Pharmacist

## 2016-07-15 DIAGNOSIS — I4891 Unspecified atrial fibrillation: Secondary | ICD-10-CM | POA: Diagnosis not present

## 2016-07-15 DIAGNOSIS — Z7901 Long term (current) use of anticoagulants: Secondary | ICD-10-CM | POA: Diagnosis not present

## 2016-07-15 DIAGNOSIS — I4821 Permanent atrial fibrillation: Secondary | ICD-10-CM

## 2016-07-15 DIAGNOSIS — I482 Chronic atrial fibrillation: Secondary | ICD-10-CM | POA: Diagnosis not present

## 2016-07-15 LAB — POCT INR: INR: 2.6

## 2016-08-04 ENCOUNTER — Telehealth: Payer: Self-pay | Admitting: Internal Medicine

## 2016-08-04 ENCOUNTER — Telehealth: Payer: Self-pay | Admitting: Neurology

## 2016-08-04 ENCOUNTER — Other Ambulatory Visit: Payer: Self-pay | Admitting: Neurology

## 2016-08-04 MED ORDER — GABAPENTIN 100 MG PO CAPS: 100.0000 mg | ORAL_CAPSULE | Freq: Three times a day (TID) | ORAL | 3 refills | Status: DC

## 2016-08-04 NOTE — Telephone Encounter (Signed)
That's fine, I refilled it thanks

## 2016-08-04 NOTE — Addendum Note (Signed)
Addended by: Hope Pigeon on: 08/04/2016 05:18 PM   Modules accepted: Orders

## 2016-08-04 NOTE — Telephone Encounter (Signed)
Dr Jaynee Eagles- patient has not been seen since 06/19/15. He made f/u on 09/24/16. Ok to do 90 day supply no refills?

## 2016-08-04 NOTE — Telephone Encounter (Signed)
[facility]. Sent to [facility] per pt request electronically.

## 2016-08-04 NOTE — Telephone Encounter (Addendum)
Patient requesting refill of gabapentin (NEURONTIN) 100 MG capsule called to [facility]. The patient has an appointment scheduled with Dr. Jaynee Eagles on 09-24-16.

## 2016-08-04 NOTE — Telephone Encounter (Signed)
APT. REMINDER CALL, LMTCB °

## 2016-08-05 ENCOUNTER — Other Ambulatory Visit (INDEPENDENT_AMBULATORY_CARE_PROVIDER_SITE_OTHER): Payer: Medicare Other

## 2016-08-05 DIAGNOSIS — D472 Monoclonal gammopathy: Secondary | ICD-10-CM

## 2016-08-06 LAB — MULTIPLE MYELOMA PANEL, SERUM
ALBUMIN/GLOB SERPL: 1.1 (ref 0.7–1.7)
ALPHA 1: 0.3 g/dL (ref 0.0–0.4)
ALPHA2 GLOB SERPL ELPH-MCNC: 0.8 g/dL (ref 0.4–1.0)
Albumin SerPl Elph-Mcnc: 3.4 g/dL (ref 2.9–4.4)
B-GLOBULIN SERPL ELPH-MCNC: 1.2 g/dL (ref 0.7–1.3)
GLOBULIN, TOTAL: 3.4 g/dL (ref 2.2–3.9)
Gamma Glob SerPl Elph-Mcnc: 1.1 g/dL (ref 0.4–1.8)
IGG (IMMUNOGLOBIN G), SERUM: 895 mg/dL (ref 700–1600)
IgA/Immunoglobulin A, Serum: 519 mg/dL — ABNORMAL HIGH (ref 61–437)
IgM (Immunoglobulin M), Srm: 41 mg/dL (ref 15–143)
M PROTEIN SERPL ELPH-MCNC: 0.2 g/dL — AB
Total Protein: 6.8 g/dL (ref 6.0–8.5)

## 2016-08-06 LAB — CBC WITH DIFFERENTIAL/PLATELET
Basophils Absolute: 0 10*3/uL (ref 0.0–0.2)
Basos: 0 %
EOS (ABSOLUTE): 0.1 10*3/uL (ref 0.0–0.4)
Eos: 1 %
Hematocrit: 41.7 % (ref 37.5–51.0)
Hemoglobin: 14.3 g/dL (ref 12.6–17.7)
IMMATURE GRANULOCYTES: 0 %
Immature Grans (Abs): 0 10*3/uL (ref 0.0–0.1)
LYMPHS ABS: 1.5 10*3/uL (ref 0.7–3.1)
Lymphs: 22 %
MCH: 35.9 pg — ABNORMAL HIGH (ref 26.6–33.0)
MCHC: 34.3 g/dL (ref 31.5–35.7)
MCV: 105 fL — AB (ref 79–97)
MONOS ABS: 0.8 10*3/uL (ref 0.1–0.9)
Monocytes: 12 %
NEUTROS PCT: 65 %
Neutrophils Absolute: 4.4 10*3/uL (ref 1.4–7.0)
PLATELETS: 151 10*3/uL (ref 150–379)
RBC: 3.98 x10E6/uL — AB (ref 4.14–5.80)
RDW: 13.5 % (ref 12.3–15.4)
WBC: 6.9 10*3/uL (ref 3.4–10.8)

## 2016-08-12 ENCOUNTER — Ambulatory Visit (INDEPENDENT_AMBULATORY_CARE_PROVIDER_SITE_OTHER): Payer: Medicare Other | Admitting: Oncology

## 2016-08-12 ENCOUNTER — Encounter: Payer: Self-pay | Admitting: Oncology

## 2016-08-12 VITALS — BP 133/63 | HR 72 | Temp 98.6°F | Ht 71.0 in | Wt 258.1 lb

## 2016-08-12 DIAGNOSIS — F1020 Alcohol dependence, uncomplicated: Secondary | ICD-10-CM | POA: Diagnosis not present

## 2016-08-12 DIAGNOSIS — D472 Monoclonal gammopathy: Secondary | ICD-10-CM

## 2016-08-12 DIAGNOSIS — Z951 Presence of aortocoronary bypass graft: Secondary | ICD-10-CM | POA: Diagnosis not present

## 2016-08-12 DIAGNOSIS — I2510 Atherosclerotic heart disease of native coronary artery without angina pectoris: Secondary | ICD-10-CM

## 2016-08-12 DIAGNOSIS — E079 Disorder of thyroid, unspecified: Secondary | ICD-10-CM

## 2016-08-12 DIAGNOSIS — Z7901 Long term (current) use of anticoagulants: Secondary | ICD-10-CM | POA: Diagnosis not present

## 2016-08-12 DIAGNOSIS — Z950 Presence of cardiac pacemaker: Secondary | ICD-10-CM | POA: Diagnosis not present

## 2016-08-12 DIAGNOSIS — I482 Chronic atrial fibrillation: Secondary | ICD-10-CM | POA: Diagnosis not present

## 2016-08-12 DIAGNOSIS — H269 Unspecified cataract: Secondary | ICD-10-CM

## 2016-08-12 DIAGNOSIS — Z888 Allergy status to other drugs, medicaments and biological substances status: Secondary | ICD-10-CM | POA: Diagnosis not present

## 2016-08-12 DIAGNOSIS — G5603 Carpal tunnel syndrome, bilateral upper limbs: Secondary | ICD-10-CM

## 2016-08-12 DIAGNOSIS — Z87891 Personal history of nicotine dependence: Secondary | ICD-10-CM | POA: Diagnosis not present

## 2016-08-12 DIAGNOSIS — G629 Polyneuropathy, unspecified: Secondary | ICD-10-CM | POA: Diagnosis not present

## 2016-08-12 NOTE — Progress Notes (Signed)
Hematology and Oncology Follow Up Visit  Mike Price [id number] 07/27/1935 81 y.o. 08/12/2016 1:49 PM   Principle Diagnosis: Encounter Diagnosis  Name Primary?  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 He has macrocytic red cell indices without anemia. He is not a diabetic but he admits to daily alcohol consumption and he has thyroid disease. This can sometimes cause changes in the red cell membrane composition resulting in macrocytosis. His total IgA level was only borderline elevated at 542 mg percent normal up to 437. There was no concomitant suppression of the other immunoglobulin classes. Kappa/lambda free light chain ratio was normal at 0.99. Lab done in anticipation of today's visit on 08/05/2016 shows stable IgA level at 519 mg percent. Hemoglobin 14.3. MCV 105.  He is doing well. No interim medical problems. He is going to have cataract surgery on his left eye within the next week or 2. He is taking gabapentin for his neuropathy. He has bilateral carpal tunnel syndrome and anticipates surgery on this as well. He is on chronic anticoagulation with warfarin for chronic atrial fibrillation stroke prophylaxis. He is status post a permanent pacemaker. Underlying coronary artery disease status post bypass graft in 2001. His blood pressures currently well controlled.  Medications: reviewed  Allergies:  Allergies  Allergen Reactions  . Lisinopril Cough  . Mefloquine Other (See Comments)    "made me crazy" anxious, upset    Review of Systems: See history of present illness. Low-grade paresthesias of his hands and feet unchanged Remaining ROS negative:   Physical Exam: Blood pressure 133/63, pulse 72, temperature 98.6 F (37 C), temperature  source Oral, height 5\' 11"  (1.803 m), weight 258 lb 1.6 oz (117.1 kg), SpO2 95 %. Wt Readings from Last 3 Encounters:  08/12/16 258 lb 1.6 oz (117.1 kg)  12/11/15 257 lb 8 oz (116.8 kg)  11/27/15 254 lb (115.2 kg)     General appearance: Well-nourished Caucasian man HENNT: Pharynx no erythema, exudate, mass, or ulcer. No thyromegaly or thyroid nodules Lymph nodes: No cervical, supraclavicular, or axillary lymphadenopathy Breasts:  Lungs: Clear to auscultation, resonant to percussion throughout Heart: Regular rhythm, no murmur, no gallop, no rub, no click, no edema Abdomen: Not examined Extremities: Chronic brawny 2+ edema of both legs to the knees with chronic venous stasis changes, no calf tenderness Musculoskeletal: no joint deformities GU:  Vascular: Carotid pulses 2+, no bruits, Neurologic: Alert, oriented, PERRLA,  cranial nerves grossly normal, motor strength 5 over 5, reflexes 1+ symmetric, upper body coordination normal, gait normal, vibration sensation mild to moderately decreased over the fingertips by tuning fork exam Skin: No rash or ecchymosis  Lab Results: CBC W/Diff    Component Value Date/Time   WBC 6.9 08/05/2016 1143   WBC 7.3 09/04/2015 1059   RBC 3.98 (L) 08/05/2016 1143   RBC 4.08 (L) 09/04/2015 1059   HGB 14.4 09/04/2015 1059   HCT 41.7 08/05/2016 1143   PLT 151 08/05/2016 1143   MCV 105 (H) 08/05/2016 1143   MCH 35.9 (H) 08/05/2016 1143   MCH 35.3 (H) 09/04/2015 1059   MCHC 34.3 08/05/2016 1143   MCHC 33.1 09/04/2015 1059   RDW 13.5 08/05/2016 1143   LYMPHSABS 1.5 08/05/2016 1143   MONOABS 0.8 12/17/2012 1300   EOSABS 0.1 08/05/2016 1143  BASOSABS 0.0 08/05/2016 1143     Chemistry      Component Value Date/Time   NA 137 09/04/2015 1059   NA 139 06/19/2015 1637   K 4.7 09/04/2015 1059   CL 99 09/04/2015 1059   CO2 32 (H) 09/04/2015 1059   BUN 16 09/04/2015 1059   BUN 18 06/19/2015 1637   CREATININE 0.75 09/04/2015 1059      Component Value  Date/Time   CALCIUM 9.0 09/04/2015 1059   ALKPHOS 90 09/04/2015 1059   AST 31 09/04/2015 1059   ALT 20 09/04/2015 1059   BILITOT 1.8 (H) 09/04/2015 1059   BILITOT 1.3 (H) 06/19/2015 1637    IgA: 519 mg percent slightly decreased compared with value 1 year ago   Radiological Studies: No results found.  Impression:  IgA monoclonal gammopathy of undetermined significance stable out over one year from diagnosis. Plan: Continue annual survey lab and exam.   CC: Patient Care Team: [name], MD as PCP - General (Internal Medicine)   Annia Belt, MD 11/7/20171:49 PM

## 2016-08-12 NOTE — Patient Instructions (Signed)
Return visit 1 year Lab 1-2 weeks before visit 

## 2016-08-13 ENCOUNTER — Ambulatory Visit (INDEPENDENT_AMBULATORY_CARE_PROVIDER_SITE_OTHER): Payer: Medicare Other | Admitting: Pharmacist Clinician (PhC)/ Clinical Pharmacy Specialist

## 2016-08-13 DIAGNOSIS — I482 Chronic atrial fibrillation: Secondary | ICD-10-CM

## 2016-08-13 DIAGNOSIS — Z7901 Long term (current) use of anticoagulants: Secondary | ICD-10-CM | POA: Diagnosis not present

## 2016-08-13 DIAGNOSIS — I4821 Permanent atrial fibrillation: Secondary | ICD-10-CM

## 2016-08-13 LAB — POCT INR: INR: 2.7

## 2016-08-19 ENCOUNTER — Telehealth: Payer: Self-pay | Admitting: Cardiovascular Disease

## 2016-08-19 NOTE — Telephone Encounter (Signed)
Records received from [facility] for apt on 09/10/16 with Dr Gwenlyn Found, records given to N. Hines (medical records) CN

## 2016-08-21 ENCOUNTER — Other Ambulatory Visit: Payer: Self-pay | Admitting: Pharmacist Clinician (PhC)/ Clinical Pharmacy Specialist

## 2016-08-21 MED ORDER — WARFARIN SODIUM 5 MG PO TABS: ORAL_TABLET | ORAL | 1 refills | Status: DC

## 2016-08-25 ENCOUNTER — Telehealth: Payer: Self-pay | Admitting: Pharmacist

## 2016-08-25 NOTE — Telephone Encounter (Signed)
Missed dose on Saturday and may have missed Sunday. Instructed to take 1.5 tablet today Monday. He is unable to come next week due to cataract surgery to be checked. Will have him keep appt on 12/6 as scheduled.

## 2016-09-04 ENCOUNTER — Telehealth: Payer: Self-pay | Admitting: Pharmacist

## 2016-09-04 NOTE — Telephone Encounter (Signed)
Pt started on Augmentin for bronchitis. He will keep appt as scheduled.

## 2016-09-07 ENCOUNTER — Encounter (HOSPITAL_COMMUNITY): Payer: Self-pay

## 2016-09-07 ENCOUNTER — Ambulatory Visit (INDEPENDENT_AMBULATORY_CARE_PROVIDER_SITE_OTHER): Payer: Medicare Other

## 2016-09-07 ENCOUNTER — Ambulatory Visit (HOSPITAL_COMMUNITY)
Admission: EM | Admit: 2016-09-07 | Discharge: 2016-09-07 | Disposition: A | Discharge status: Home / Self Care | Payer: Medicare Other | Attending: Emergency Medicine | Admitting: Emergency Medicine

## 2016-09-07 DIAGNOSIS — D472 Monoclonal gammopathy: Secondary | ICD-10-CM

## 2016-09-07 DIAGNOSIS — J189 Pneumonia, unspecified organism: Secondary | ICD-10-CM | POA: Diagnosis not present

## 2016-09-07 DIAGNOSIS — I25700 Atherosclerosis of coronary artery bypass graft(s), unspecified, with unstable angina pectoris: Secondary | ICD-10-CM

## 2016-09-07 MED ORDER — ALBUTEROL SULFATE HFA 108 (90 BASE) MCG/ACT IN AERS: 2.0000 | INHALATION_SPRAY | RESPIRATORY_TRACT | 0 refills | Status: DC | PRN

## 2016-09-07 MED ORDER — IPRATROPIUM-ALBUTEROL 0.5-2.5 (3) MG/3ML IN SOLN
3.0000 mL | Freq: Once | RESPIRATORY_TRACT | Status: AC
  Administered 2016-09-07: 3 mL via RESPIRATORY_TRACT

## 2016-09-07 MED ORDER — LEVOFLOXACIN 500 MG PO TABS: 500.0000 mg | ORAL_TABLET | Freq: Every day | ORAL | 0 refills | Status: DC

## 2016-09-07 MED ORDER — IPRATROPIUM-ALBUTEROL 0.5-2.5 (3) MG/3ML IN SOLN
RESPIRATORY_TRACT | Status: AC
  Filled 2016-09-07: qty 3

## 2016-09-07 NOTE — ED Provider Notes (Signed)
[place]    CSN: [id number] Arrival date & time: 09/07/16  1521     History   Chief Complaint Chief Complaint  Patient presents with  . Cough    HPI Mike Price is a 81 y.o. male.   HPI  He is a 80 year old man here for evaluation of cough. His symptoms started last week with some sore throat and a headache that then moved into his chest. He reports frequent deep cough that causes pain in his stomach. He also reports wheezing. He was seen by his PCP the end of last week and diagnosed with bronchitis. He was started on Augmentin, but has not had any improvement. He called his PCP today and he recommended going to an urgent care for a chest x-ray to rule out pneumonia. Today, he reports continued cough and wheezing as well as some worsening shortness of breath. He also states his temperature has been creeping up the last few days, but no fevers.  Past Medical History:  Diagnosis Date  . Anemia   . Anxiety 01-11-13   tx. Clonazepam.  . Arthritis    Osteoarthritis-knees, fingers  . CAD (coronary artery disease)   . Dysrhythmia 01-11-13    hx. A. Fib-Pacemaker implanted left chest  . Edema extremities 01-11-13   retains fluid in legs-right greater than left.  [name] Kitchen GERD (gastroesophageal reflux disease)   . Hyperlipemia   . Hypertension   . Hypothyroidism   [name] 07/10/2015   IgA 542 mg% 06/19/15. IgA lambda paraprotein on IFE, normal IgG & IgM  . Neuromuscular disorder (HCC)    neuropathy in feet  . Obstructive sleep apnea    on C Pap  . Pacemaker 12/15/06   3'08  . Thyroid disease   . TMJ tenderness 01-11-13   right side-has issues from time to time.    Patient Active Problem List   Diagnosis Date Noted  . CHB (complete heart block) ([place]) 09/11/2015  . Pacemaker battery depletion 09/11/2015  [name] 07/10/2015  . Pacemaker 07/02/2013  . CAD (coronary artery disease)  of artery bypass graft 02/16/2013  . HTN (hypertension) 02/16/2013  . Hyperlipidemia 02/16/2013  . Postoperative anemia due to acute blood loss 01/20/2013  . Postop Hyponatremia 01/18/2013  . OA (osteoarthritis) of knee 01/17/2013  . Permanent atrial fibrillation ([place]) 12/21/2012  . Long term current use of anticoagulant therapy 12/21/2012    Past Surgical History:  Procedure Laterality Date  . APPENDECTOMY    . CARDIAC CATHETERIZATION  03/28/08   patent grafts, nl EF  . CERVICAL LAMINECTOMY    . CORONARY ARTERY BYPASS GRAFT  2001   4 vessels-'00  . EP IMPLANTABLE DEVICE N/A 09/11/2015   Procedure: PPM Generator Changeout;  Surgeon: Sanda Klein, MD;  Location: [facility];  Service: Cardiovascular;  Laterality: N/A;  . HERNIA REPAIR    . INSERT / REPLACE / REMOVE PACEMAKER     '08-inserted left chest  . JOINT REPLACEMENT     LTHA  . NM MYOCAR PERF WALL MOTION  02/12/2012   small fixed distal anteroapical/apical defect. No reversible ischemia  . PACEMAKER INSERTION  12/15/06   medtronic  . TONSILLECTOMY     child  . TOTAL KNEE ARTHROPLASTY Right 01/17/2013   Procedure: RIGHT TOTAL KNEE ARTHROPLASTY;  Surgeon: Gearlean Alf, MD;  Location: [facility];  Service: Orthopedics;  Laterality: Right;  . US ECHOCARDIOGRAPHY  12/28/2007  LA mod. dilated,RA mildly dilated       Home Medications    Prior to Admission medications   Medication Sig Start Date End Date Taking? Authorizing Provider  acetaminophen (TYLENOL) 500 MG tablet Take 1,000 mg by mouth every 6 (six) hours as needed.    Historical Provider, MD  albuterol (PROVENTIL HFA;VENTOLIN HFA) 108 (90 Base) MCG/ACT inhaler Inhale 2 puffs into the lungs every 4 (four) hours as needed for wheezing or shortness of breath. 09/07/16   Melony Overly, MD  atorvastatin (LIPITOR) 40 MG tablet Take 40 mg by mouth every evening.     Historical Provider, MD  bisacodyl (DULCOLAX) 10 MG suppository Place 1 suppository (10 mg total)  rectally daily as needed. 01/20/13   Alexzandrew L Perkins, PA-C  clonazePAM (KLONOPIN) 0.5 MG tablet Take 0.25-0.5 mg by mouth 3 (three) times daily as needed for anxiety. Takes 1/2 tablet twice daily then a whole tablet at bedtime    Historical Provider, MD  doxazosin (CARDURA) 2 MG tablet Take 1 mg by mouth at bedtime.    Historical Provider, MD  ezetimibe (ZETIA) 10 MG tablet Take 10 mg by mouth every evening.     Historical Provider, MD  ferrous sulfate 325 (65 FE) MG tablet Take 325 mg by mouth every other day. On Monday, Wednesday and Friday    Historical Provider, MD  furosemide (LASIX) 20 MG tablet Take 10 mg by mouth daily.     Historical Provider, MD  gabapentin (NEURONTIN) 100 MG capsule Take 1 capsule (100 mg total) by mouth 3 (three) times daily. 08/04/16   Melvenia Beam, MD  levofloxacin (LEVAQUIN) 500 MG tablet Take 1 tablet (500 mg total) by mouth daily. 09/07/16   Melony Overly, MD  losartan (COZAAR) 100 MG tablet Take 100 mg by mouth daily.    Historical Provider, MD  metoprolol (LOPRESSOR) 100 MG tablet Take 100 mg by mouth 2 (two) times daily.    Historical Provider, MD  NON FORMULARY CPAP qhs    Historical Provider, MD  omeprazole (PRILOSEC) 20 MG capsule Take 20 mg by mouth 2 (two) times daily.    Historical Provider, MD  Skin Protectants, Misc. (EUCERIN) cream Apply 1 application topically daily as needed for dry skin.    Historical Provider, MD  SYNTHROID 88 MCG tablet Take 88 mcg by mouth daily. 05/07/15   Historical Provider, MD  warfarin (COUMADIN) 5 MG tablet Take 1 to 1.5 tablets by mouth daily as directed by coumadin clinic 08/21/16   Sanda Klein, MD    Family History Family History  Problem Relation Age of Onset  . Stroke      Grandfather   . Neuropathy Neg Hx     Social History Social History  Substance Use Topics  . Smoking status: Former Smoker    Quit date: 01/12/1984  . Smokeless tobacco: Never Used  . Alcohol use 1.8 oz/week    3 Shots of liquor per  week     Comment:  3 ounces daily     Allergies   Lisinopril and Mefloquine   Review of Systems Review of Systems As in history of present illness  Physical Exam Triage Vital Signs ED Triage Vitals  Enc Vitals Group     BP 09/07/16 1623 167/70     Pulse Rate 09/07/16 1620 84     Resp 09/07/16 1620 16     Temp 09/07/16 1620 98.6 F (37 C)     Temp Source 09/07/16 1620  Oral     SpO2 09/07/16 1620 97 %     Weight --      Height --      Head Circumference --      Peak Flow --      Pain Score 09/07/16 1623 5     Pain Loc --      Pain Edu? --      Excl. in [place]? --    No data found.   Updated Vital Signs BP 167/70   Pulse 84   Temp 98.6 F (37 C) (Oral)   Resp 16   SpO2 97%   Visual Acuity Right Eye Distance:   Left Eye Distance:   Bilateral Distance:    Right Eye Near:   Left Eye Near:    Bilateral Near:     Physical Exam  Constitutional: He is oriented to person, place, and time. He appears well-developed and well-nourished. No distress.  Neck: Neck supple.  Cardiovascular: Normal rate, regular rhythm and normal heart sounds.   No murmur heard. Pulmonary/Chest: Effort normal. No respiratory distress. He has wheezes (inspiratory and expiratory). He has no rales.  Appears slightly short of breath  Lymphadenopathy:    He has no cervical adenopathy.  Neurological: He is alert and oriented to person, place, and time.     UC Treatments / Results  Labs (all labs ordered are listed, but only abnormal results are displayed) Labs Reviewed - No data to display  EKG  EKG Interpretation None       Radiology Dg Chest 2 View  Result Date: 09/07/2016 CLINICAL DATA:  Patient states that he started having cough x 3 days ago, chest congestion, fever, sob. Hx of heart surgery,pacemaker. Non-smoker since 1984. EXAM: CHEST  2 VIEW COMPARISON:  01/19/2015 FINDINGS: There are stable changes from previous CABG surgery. The cardiac silhouette is mildly enlarged. No  mediastinal or hilar masses. No convincing adenopathy. Lungs demonstrate mild interstitial thickening most evident in the peripheral right lung base. No focal lung consolidation. Lungs are mildly hyperexpanded. No pleural effusion. No pneumothorax. Left anterior chest wall sequential pacemaker is stable in well positioned. Skeletal structures are demineralized but intact. IMPRESSION: 1. Mild interstitial thickening, mostly at the right lung base, which appears more prominent than on recent prior studies. Consider interstitial infection given the patient's provided history. Findings could reflect mild interstitial edema. 2. No evidence of lobar pneumonia. 3. Stable changes from previous CABG surgery. Mild cardiomegaly, also stable. Electronically Signed   By: Lajean Manes M.D.   On: 09/07/2016 16:59    Procedures Procedures (including critical care time)  Medications Ordered in UC Medications  ipratropium-albuterol (DUONEB) 0.5-2.5 (3) MG/3ML nebulizer solution 3 mL (3 mLs Nebulization Given 09/07/16 1655)     Initial Impression / Assessment and Plan / UC Course  I have reviewed the triage vital signs and the nursing notes.  Pertinent labs & imaging results that were available during my care of the patient were reviewed by me and considered in my medical decision making (see chart for details).  Clinical Course     Wheezing much improved after DuoNeb. X-ray concerning for interstitial pneumonia. We'll change antibiotics to Levaquin. Albuterol for wheezing. I have spoken with his PCP who will follow up with him later this week.  Final Clinical Impressions(s) / UC Diagnoses   Final diagnoses:  Community acquired pneumonia, unspecified laterality    New Prescriptions New Prescriptions   ALBUTEROL (PROVENTIL HFA;VENTOLIN HFA) 108 (90 BASE) MCG/ACT INHALER  Inhale 2 puffs into the lungs every 4 (four) hours as needed for wheezing or shortness of breath.   LEVOFLOXACIN (LEVAQUIN) 500 MG  TABLET    Take 1 tablet (500 mg total) by mouth daily.     Melony Overly, MD 09/07/16 [phone]

## 2016-09-07 NOTE — ED Triage Notes (Signed)
Pt having a cough and has seen his pcp and was diagnosed with bronchitis and was given a rx for Augmentin. Has been taking the augmentin, has 3 more days left. But not feeling better says he is feeling worse. Haven't slept in 2 nights. Belly hurts from coughing so much. Wants to rule out pneumonia.

## 2016-09-07 NOTE — Discharge Instructions (Signed)
You have a pneumonia. Stop the Augmentin. Start Levaquin 1 pill a day for 7 days. Use the albuterol inhaler every 4 hours as needed for wheezing. Follow-up with Dr. Nyoka Cowden later this week.

## 2016-09-08 ENCOUNTER — Encounter: Payer: Self-pay | Admitting: Pharmacist

## 2016-09-08 ENCOUNTER — Telehealth: Payer: Self-pay | Admitting: Pharmacist

## 2016-09-08 NOTE — Telephone Encounter (Signed)
Patient called clinic to informed change in medication.  Dx with pneumonia and initiated on levofloxacin 500 mg po daily (started 09/07/16) and albuterol inh started 09/08/16.  Noted appointment for 09/10/16.  Returned call ; educated about drug-drug interaction with levofloxacin, will follow up INR on Wednesday and adjust warfarin therapy if/as needed.

## 2016-09-10 ENCOUNTER — Ambulatory Visit (INDEPENDENT_AMBULATORY_CARE_PROVIDER_SITE_OTHER): Payer: Medicare Other | Admitting: Pharmacist Clinician (PhC)/ Clinical Pharmacy Specialist

## 2016-09-10 DIAGNOSIS — I4821 Permanent atrial fibrillation: Secondary | ICD-10-CM

## 2016-09-10 DIAGNOSIS — I25708 Atherosclerosis of coronary artery bypass graft(s), unspecified, with other forms of angina pectoris: Secondary | ICD-10-CM

## 2016-09-10 DIAGNOSIS — I4891 Unspecified atrial fibrillation: Secondary | ICD-10-CM

## 2016-09-10 DIAGNOSIS — Z7901 Long term (current) use of anticoagulants: Secondary | ICD-10-CM | POA: Diagnosis not present

## 2016-09-10 DIAGNOSIS — I482 Chronic atrial fibrillation: Secondary | ICD-10-CM | POA: Diagnosis not present

## 2016-09-10 LAB — POCT INR: INR: 2.2

## 2016-09-11 ENCOUNTER — Telehealth: Payer: Self-pay | Admitting: Cardiology

## 2016-09-11 ENCOUNTER — Ambulatory Visit (INDEPENDENT_AMBULATORY_CARE_PROVIDER_SITE_OTHER): Payer: Medicare Other | Admitting: *Deleted

## 2016-09-11 DIAGNOSIS — I442 Atrioventricular block, complete: Secondary | ICD-10-CM | POA: Diagnosis not present

## 2016-09-11 NOTE — Telephone Encounter (Signed)
Spoke with pt and reminded pt of remote transmission that is due today. Pt verbalized understanding.   

## 2016-09-11 NOTE — Progress Notes (Signed)
Remote pacemaker transmission.   

## 2016-09-17 ENCOUNTER — Encounter: Payer: Self-pay | Admitting: Cardiology

## 2016-09-24 ENCOUNTER — Ambulatory Visit (INDEPENDENT_AMBULATORY_CARE_PROVIDER_SITE_OTHER): Payer: Medicare Other | Admitting: Neurology

## 2016-09-24 VITALS — BP 135/80 | HR 88 | Ht 71.0 in | Wt 243.0 lb

## 2016-09-24 DIAGNOSIS — I25708 Atherosclerosis of coronary artery bypass graft(s), unspecified, with other forms of angina pectoris: Secondary | ICD-10-CM | POA: Diagnosis not present

## 2016-09-24 DIAGNOSIS — G5603 Carpal tunnel syndrome, bilateral upper limbs: Secondary | ICD-10-CM | POA: Diagnosis not present

## 2016-09-24 MED ORDER — GABAPENTIN 100 MG PO CAPS: 100.0000 mg | ORAL_CAPSULE | Freq: Three times a day (TID) | ORAL | 5 refills | Status: DC

## 2016-09-24 NOTE — Patient Instructions (Signed)
Remember to drink plenty of fluid, eat healthy meals and do not skip any meals. Try to eat protein with a every meal and eat a healthy snack such as fruit or nuts in between meals. Try to keep a regular sleep-wake schedule and try to exercise daily, particularly in the form of walking, 20-30 minutes a day, if you can.   As far as your medications are concerned, I would like to suggest: Neurontin as needed  I would like to see you back as needed, sooner if we need to. Please call us with any interim questions, concerns, problems, updates or refill requests.   Our phone number is [phone]. We also have an after hours call service for urgent matters and there is a physician on-call for urgent questions. For any emergencies you know to call 911 or go to the nearest emergency room

## 2016-09-24 NOTE — Progress Notes (Signed)
[facility]    Provider:  Dr Mike Price Referring Provider: [name], MD Primary Care Physician:  Mike Peaches, MD  CC:  Neuropathy and tremor  Interval history 09/24/2016: Patient is here for follow up today of neuropathy for over 17 years. Extensive workups in the past including labwork and emg/ncs. labwork revealed IgA monoclonal protein with lambda light chain specificity and he was referred to hematology. EMG/NCS revealed moderately severe bilateral CTS in addition to his idiopathic polyneuropathy.  His wife had a TIA last week. His feet are stable. He has numbness in the feet from years of idiopathic neuropathy.[name] Kitchen He is a retired Counsellor. He has falled three times this summer. He was using a cane at the time and now he has a walker. He tripped over a ridge on the carpet at home once. The other time he used his cane instead of his walker. And fell on a curved slope of concrete. He was at his friend's home and the last step had nothing to hold onto and he lurched off and landed face down in the grass. I advised physical therapy for gait and balance but he declines at this time.  He drinks alcohol daily, unclear if there is an etoh component to his neuropathy. Since using the walker no falls. He drinks 2-3 "generous" martinis a day   HPI 06/19/2015:  Mike Price is a 81 y.o. male here as a referral from Dr. Nyoka Price for neuropathy and tremor. PMH CABG and HLD, balance problems, generalized weakness.  In 2001 he had 2x CABG completed. That's when the neuropathy in the feet started, like they were shrink wrapped. He later discovered he had thyroid problems. He had shooting pain in the feet and burning. More recently he has noticed his hands shaking, dropping objects out of his hands, shaking. He had an emg/ncs 10 years ago here in the office and he was diagnosed with neuropathy, doesn't want that again. His feet feel like wooden, numb now. The burning and shooting  pains have stopped. His balance is worsening. He missed a step and fell and now he is scared and says this contributes to his balance issues. He uses a walker. He denies low back pain and he has a replaced left hip and a replaced right knee. His left knee needs replacement as well. He stumbles over his feet, he can't feel his feet. He still drives and he can feel the pedals. Symptoms in the hands started a year ago. His hands feel clumsy. Tremor started a year ago, especially when he is writing. Notices it more with action. Difficulty holding a coffee cup. 2-3 drinks martinis a day and in the past has drunk more but infrequently more than 2-3 a day. Dad may have had a tremor. Grandfather with lupus. He has neck pain which is mild. He was treated in the past for TB many years ago but doesn't remember what medication. He took mefloquin in the past for malaria. He denies many of the medications that can induce polyneuropathy such as certain antibiotics, chemotherapeutic agents, cardiovascular drugs, rheumatologic drugs, and other miscellaneous substances and toxins.  Reviewed notes, labs and imaging from outside physicians, which showed:   CT HEAD WITHOUT CONTRAST, personally reviewed images and agree with findings  Findings: Mild atrophy. Mild chronic microvascular ischemic change in the white matter. Negative for acute infarct. Negative for hemorrhage or mass. No midline shift. Negative for skull fracture.  IMPRESSION: Mild chronic microvascular ischemia. No acute abnormality.  Review of Systems: Patient complains of symptoms per HPI as well as the following symptoms: Swelling in legs, anemia, easy bruising, easy bleeding, feeling cold, flushing, joint pain, joint swelling, cramps, constipation, moles, impotence, runny nose, numbness, weakness, tremor, anxiety, decreased energy. Pertinent negatives per HPI. All others negative.   Social History   Social History  . Marital status: Married     Spouse name: Mike Price  . Number of children: N/A  . 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 Smoking status: Former Smoker    Quit date: 01/12/1984  . Smokeless tobacco: Never Used  . Alcohol use 1.8 oz/week    3 Shots of liquor per week     Comment:  3 ounces daily  . Drug use: No  . Sexual activity: Not on file   Other Topics Concern  . Not on file   Social History Narrative   Lives at home with wife, Mike Price   Caffeine use: 2 cups coffee per day       Family History  Problem Relation Age of Onset  . Stroke      Grandfather   . Neuropathy Neg Hx     Past Medical History:  Diagnosis Date  . Anemia   . Anxiety 01-11-13   tx. Clonazepam.  . Arthritis    Osteoarthritis-knees, fingers  . CAD (coronary artery disease)   . Dysrhythmia 01-11-13    hx. A. Fib-Pacemaker implanted left chest  . Edema extremities 01-11-13   retains fluid in legs-right greater than left.  [name] Kitchen GERD (gastroesophageal reflux disease)   . Hyperlipemia   . Hypertension   . Hypothyroidism   [name] 07/10/2015   IgA 542 mg% 06/19/15. IgA lambda paraprotein on IFE, normal IgG & IgM  . Neuromuscular disorder (HCC)    neuropathy in feet  . Obstructive sleep apnea    on C Pap  . Pacemaker 12/15/06   3'08  . Thyroid disease   . TMJ tenderness 01-11-13   right side-has issues from time to time.    Past Surgical History:  Procedure Laterality Date  . APPENDECTOMY    . CARDIAC CATHETERIZATION  03/28/08   patent grafts, nl EF  . CERVICAL LAMINECTOMY    . CORONARY ARTERY BYPASS GRAFT  2001   4 vessels-'00  . EP IMPLANTABLE DEVICE N/A 09/11/2015   Procedure: PPM Generator Changeout;  Surgeon: Mike Klein, MD;  Location: [facility];  Service: Cardiovascular;  Laterality: N/A;  . HERNIA REPAIR    . INSERT / REPLACE / REMOVE PACEMAKER     '08-inserted left chest  . JOINT REPLACEMENT     LTHA  . NM  MYOCAR PERF WALL MOTION  02/12/2012   small fixed distal anteroapical/apical defect. No reversible ischemia  . PACEMAKER INSERTION  12/15/06   medtronic  . TONSILLECTOMY     child  . TOTAL KNEE ARTHROPLASTY Right 01/17/2013   Procedure: RIGHT TOTAL KNEE ARTHROPLASTY;  Surgeon: Gearlean Alf, MD;  Location: [facility];  Service: Orthopedics;  Laterality: Right;  . US ECHOCARDIOGRAPHY  12/28/2007   LA mod. dilated,RA mildly dilated    Current Outpatient Prescriptions  Medication Sig Dispense Refill  . acetaminophen (TYLENOL) 500 MG tablet Take 1,000 mg by mouth every 6 (six) hours as needed.    [name] Kitchen atorvastatin (LIPITOR) 40 MG tablet Take 40 mg by mouth every evening.     [name] Kitchen  bisacodyl (DULCOLAX) 10 MG suppository Place 1 suppository (10 mg total) rectally daily as needed. 12 suppository 0  . clonazePAM (KLONOPIN) 0.5 MG tablet Take 0.25-0.5 mg by mouth 3 (three) times daily as needed for anxiety. Takes 1/2 tablet twice daily then a whole tablet at bedtime    . doxazosin (CARDURA) 2 MG tablet Take 1 mg by mouth at bedtime.    . DUREZOL 0.05 % EMUL     . ezetimibe (ZETIA) 10 MG tablet Take 10 mg by mouth every evening.     . ferrous sulfate 325 (65 FE) MG tablet Take 325 mg by mouth every other day. On Monday, Wednesday and Friday    . furosemide (LASIX) 20 MG tablet Take 10 mg by mouth daily.     [name] Kitchen gabapentin (NEURONTIN) 100 MG capsule Take 1 capsule (100 mg total) by mouth 3 (three) times daily. 270 capsule 3  . losartan (COZAAR) 100 MG tablet Take 100 mg by mouth daily.    . metoprolol (LOPRESSOR) 100 MG tablet Take 100 mg by mouth 2 (two) times daily.    . NON FORMULARY CPAP qhs    . omeprazole (PRILOSEC) 20 MG capsule Take 20 mg by mouth 2 (two) times daily.    [name] Kitchen PROLENSA 0.07 % SOLN Place into the left eye.     . Skin Protectants, Misc. (EUCERIN) cream Apply 1 application topically daily as needed for dry skin.    [name] Kitchen SYNTHROID 88 MCG tablet Take 88 mcg by mouth daily.    . traMADol (ULTRAM) 50  MG tablet Take 50 mg by mouth as needed.    . trimethoprim-polymyxin b (POLYTRIM) ophthalmic solution     . warfarin (COUMADIN) 5 MG tablet Take 1 to 1.5 tablets by mouth daily as directed by coumadin clinic 120 tablet 1   No current facility-administered medications for this visit.     Allergies as of 09/24/2016 - Review Complete 09/24/2016  Allergen Reaction Noted  . Lisinopril Cough 01/11/2013  . Mefloquine Other (See Comments) 01/11/2013    Vitals: Ht 5\' 11"  (1.803 m)   Wt 243 lb (110.2 kg)   BMI 33.89 kg/m  Last Weight:  Wt Readings from Last 1 Encounters:  09/24/16 243 lb (110.2 kg)   Last Height:   Ht Readings from Last 1 Encounters:  09/24/16 5\' 11"  (1.803 m)   Physical exam: Exam: Gen: NAD, conversant, well nourised, obese, well groomed                     CV: RRR, no MRG. No Carotid Bruits. Mild peripheral edema, warm, nontender Eyes: Conjunctivae clear without exudates or hemorrhage  Neuro: Detailed Neurologic Exam  Speech:    Speech is normal; fluent and spontaneous with normal comprehension.  Cognition:    The patient is oriented to person, place, and time;     recent and remote memory intact;     language fluent;     normal attention, concentration,     fund of knowledge Cranial Nerves:    The pupils are equal, round, and reactive to light. The fundi are flat. Visual fields are full to finger confrontation. Extraocular movements are intact. Trigeminal sensation is intact and the muscles of mastication are normal. The face is symmetric. The palate elevates in the midline. Hearing intact. Voice is normal. Shoulder shrug is normal. The tongue has normal motion without fasciculations.   Coordination:    Normal finger to nose.  Gait: Sensory ataxia  Motor Observation:  Postural high frequency low amplitude tremor with kinetic component  Tone:    Normal muscle tone.    Posture:    Posture is normal. normal erect    Strength: Right delt weakness  due to rotator cuff problems that are chronic otherwise  5/5 strength     Sensation:  vibartion absent at great toe but intact medial mall Decreased pp and temp to the knees and mid forearm  Slightly impaired proprioception     Reflex Exam:  DTR's: 1+ uppers.    Deep tendon reflexes in the lower extremities are absent Toes:    The toes are downgoing bilaterally.   Clonus:    Clonus is absent.   Assessment/Plan:    Mike Price is a 81 y.o. male here as a referral from Dr. Nyoka Price for neuropathy and tremor. PMH CABG and HLD, balance problems, generalized weakness. He has quite a significant peripheral neuropathy with sensory ataxia. His strength is actually quite good. Symptoms started over 15 years ago and have progressed slowly. More recently patient's hands are involved. Had a long discussion with patient and his wife regarding length dependent peripheral neuropathy, there are many different causes. Patient has a history of several martinis a day and maybe this has some contribution but no other significant risk factors.    - He drinks alcohol daily, unclear if there is an etoh component to his neuropathy. Since using the walker no falls. He drinks 2-3 "generous" martinis a day. He declines PT. Fall risk, discussed.  - Extensive workups in the past including labwork and emg/ncs. Labwork revealed IgA monoclonal protein with lambda light chain specificity and he was referred to hematology. EMG/NCS revealed moderately severe bilateral CTS in addition to his idiopathic polyneuropathy.   - Advised drinking less than 2 appropriate sized alcoholic beverages daily on average  - He would like to be sent to [facility] for CTS   Sarina Ill, MD  [facility] [address] [place], [place] [address]  Phone [phone] Fax [phone]  A total of 30 minutes was spent face-to-face with this patient. Over half this time was spent on  counseling patient on the peripheral neuropathy diagnosis, CTS and different diagnostic and therapeutic options available.

## 2016-09-25 ENCOUNTER — Encounter: Payer: Self-pay | Admitting: Neurology

## 2016-10-01 ENCOUNTER — Encounter: Payer: Self-pay | Admitting: Cardiology

## 2016-10-02 ENCOUNTER — Telehealth: Payer: Self-pay | Admitting: Neurology

## 2016-10-02 NOTE — Telephone Encounter (Signed)
[facility]: pt declined to schedule.

## 2016-10-06 HISTORY — PX: CARPAL TUNNEL RELEASE: SHX101

## 2016-10-07 LAB — CUP PACEART REMOTE DEVICE CHECK
Battery Impedance: 112 Ohm
Implantable Lead Implant Date: 20080311
Implantable Lead Location: 753859
Implantable Lead Model: 5076
Implantable Pulse Generator Implant Date: 20161206
Lead Channel Impedance Value: 67 Ohm
Lead Channel Impedance Value: 705 Ohm
Lead Channel Pacing Threshold Pulse Width: 0.4 ms
Lead Channel Setting Pacing Amplitude: 2.25 V
MDC IDC LEAD IMPLANT DT: 20080311
MDC IDC LEAD LOCATION: 753860
MDC IDC MSMT BATTERY REMAINING LONGEVITY: 161 mo
MDC IDC MSMT BATTERY VOLTAGE: 2.79 V
MDC IDC MSMT LEADCHNL RV PACING THRESHOLD AMPLITUDE: 1.125 V
MDC IDC SESS DTM: 20171207161750
MDC IDC SET LEADCHNL RV PACING PULSEWIDTH: 0.4 ms
MDC IDC SET LEADCHNL RV SENSING SENSITIVITY: 2.8 mV
MDC IDC STAT BRADY RV PERCENT PACED: 100 %

## 2016-10-09 ENCOUNTER — Ambulatory Visit (INDEPENDENT_AMBULATORY_CARE_PROVIDER_SITE_OTHER): Payer: Medicare Other | Admitting: Pharmacist Clinician (PhC)/ Clinical Pharmacy Specialist

## 2016-10-09 DIAGNOSIS — I4891 Unspecified atrial fibrillation: Secondary | ICD-10-CM | POA: Diagnosis not present

## 2016-10-09 DIAGNOSIS — I4821 Permanent atrial fibrillation: Secondary | ICD-10-CM

## 2016-10-09 DIAGNOSIS — I482 Chronic atrial fibrillation: Secondary | ICD-10-CM | POA: Diagnosis not present

## 2016-10-09 DIAGNOSIS — Z7901 Long term (current) use of anticoagulants: Secondary | ICD-10-CM

## 2016-10-09 LAB — POCT INR: INR: 2.8

## 2016-11-03 ENCOUNTER — Ambulatory Visit (INDEPENDENT_AMBULATORY_CARE_PROVIDER_SITE_OTHER): Payer: Medicare Other

## 2016-11-03 ENCOUNTER — Telehealth: Payer: Self-pay | Admitting: Cardiovascular Disease

## 2016-11-03 ENCOUNTER — Encounter (HOSPITAL_COMMUNITY): Payer: Self-pay | Admitting: Emergency Medicine

## 2016-11-03 ENCOUNTER — Ambulatory Visit (HOSPITAL_COMMUNITY)
Admission: EM | Admit: 2016-11-03 | Discharge: 2016-11-03 | Disposition: A | Discharge status: Home / Self Care | Payer: Medicare Other | Attending: Emergency Medicine | Admitting: Emergency Medicine

## 2016-11-03 DIAGNOSIS — D472 Monoclonal gammopathy: Secondary | ICD-10-CM

## 2016-11-03 DIAGNOSIS — R05 Cough: Secondary | ICD-10-CM | POA: Diagnosis not present

## 2016-11-03 DIAGNOSIS — R918 Other nonspecific abnormal finding of lung field: Secondary | ICD-10-CM | POA: Diagnosis not present

## 2016-11-03 DIAGNOSIS — R0602 Shortness of breath: Secondary | ICD-10-CM

## 2016-11-03 DIAGNOSIS — I25700 Atherosclerosis of coronary artery bypass graft(s), unspecified, with unstable angina pectoris: Secondary | ICD-10-CM

## 2016-11-03 DIAGNOSIS — R042 Hemoptysis: Secondary | ICD-10-CM

## 2016-11-03 DIAGNOSIS — R059 Cough, unspecified: Secondary | ICD-10-CM

## 2016-11-03 MED ORDER — CEFDINIR 300 MG PO CAPS
300.0000 mg | ORAL_CAPSULE | Freq: Two times a day (BID) | ORAL | 0 refills | Status: DC
Start: 2016-11-03 — End: 2016-12-12

## 2016-11-03 NOTE — Telephone Encounter (Signed)
Pt of Dr. Gwenlyn Found, Dr. Sallyanne Kuster  Reports "Thursday night I was kept awake by a gurgling in my airway" "at one point I coughed up a loogie that really looked like a big blood clot - phlegm and blood." Went for next 2 days, periods of the "gurgling" that had bothered him on Thursday night. Last night slept well until gurgling came back this AM.  Patient reports CPAP use coincident w the bloody mucus.  Neg for fever, chills, nasal discharge?  Of note, he had walking PNA in December  his PCP is out of town, not associated w larger practice so notes he does not have covering staff. Pt requesting advice - urgent care vs home management, pulm referral etc.  Aware I will seek review by provider and return his call.

## 2016-11-03 NOTE — Telephone Encounter (Signed)
Spoke to patient and put in orders for CXR. He reports he is going to urgent care for evaluation today. Discussed and advised this is reasonable, we can evaluate further later in the week. Recommended he make sure they know he is on coumadin and of our concern to check his INR.  He'll plan to f/u w me via phone after his evaluation. Will send msg to scheduling to arrange return OV later this week w provider.

## 2016-11-03 NOTE — Telephone Encounter (Signed)
Patient eval'd in urgent care today.

## 2016-11-03 NOTE — ED Provider Notes (Signed)
CSN: [id number]     Arrival date & time 11/03/16  1123 History   First MD Initiated Contact with Patient 11/03/16 1349     Chief Complaint  Patient presents with  . Cough  . Shortness of Breath   (Consider location/radiation/quality/duration/timing/severity/associated sxs/prior Treatment) 82 year old male complains of a four-day history of gurgling and rattling in his upper chest and throat. He has had some collection of sputum and twice in the past 4 days it is been associated with bloody sputum. His also had occasional shortness of breath, relatively mild and a little worse with exertion. Pertussis and when his right upper anterior chest adjacent to the sternum and states that her something a little different there. He does not call it a pain that when he was offered various worst describe it he states it might be a pressure. He is awake, alert, jovial, talkative and showing no signs of distress. He also mentions that he is feeling bloated and has a history of diverticulitis. He denies having any fever past week or so. He is on Coumadin.      Past Medical History:  Diagnosis Date  . Anemia   . Anxiety 01-11-13   tx. Clonazepam.  . Arthritis    Osteoarthritis-knees, fingers  . CAD (coronary artery disease)   . Dysrhythmia 01-11-13    hx. A. Fib-Pacemaker implanted left chest  . Edema extremities 01-11-13   retains fluid in legs-right greater than left.  [name] Kitchen GERD (gastroesophageal reflux disease)   . Hyperlipemia   . Hypertension   . Hypothyroidism   [name] 07/10/2015   IgA 542 mg% 06/19/15. IgA lambda paraprotein on IFE, normal IgG & IgM  . Neuromuscular disorder (HCC)    neuropathy in feet  . Obstructive sleep apnea    on C Pap  . Pacemaker 12/15/06   3'08  . Thyroid disease   . TMJ tenderness 01-11-13   right side-has issues from time to time.   Past Surgical History:  Procedure Laterality Date  . APPENDECTOMY    . CARDIAC  CATHETERIZATION  03/28/08   patent grafts, nl EF  . CERVICAL LAMINECTOMY    . CORONARY ARTERY BYPASS GRAFT  2001   4 vessels-'00  . EP IMPLANTABLE DEVICE N/A 09/11/2015   Procedure: PPM Generator Changeout;  Surgeon: Sanda Klein, MD;  Location: [facility];  Service: Cardiovascular;  Laterality: N/A;  . HERNIA REPAIR    . INSERT / REPLACE / REMOVE PACEMAKER     '08-inserted left chest  . JOINT REPLACEMENT     LTHA  . NM MYOCAR PERF WALL MOTION  02/12/2012   small fixed distal anteroapical/apical defect. No reversible ischemia  . PACEMAKER INSERTION  12/15/06   medtronic  . TONSILLECTOMY     child  . TOTAL KNEE ARTHROPLASTY Right 01/17/2013   Procedure: RIGHT TOTAL KNEE ARTHROPLASTY;  Surgeon: Gearlean Alf, MD;  Location: [facility];  Service: Orthopedics;  Laterality: Right;  . US ECHOCARDIOGRAPHY  12/28/2007   LA mod. dilated,RA mildly dilated   Family History  Problem Relation Age of Onset  . Stroke      Grandfather   . Neuropathy Neg Hx    Social History  Substance Use Topics  . Smoking status: Former Smoker    Quit date: 01/12/1984  . Smokeless tobacco: Never Used  . Alcohol use 1.8 oz/week    3 Shots of liquor per week     Comment:  3 ounces daily  Review of Systems  Constitutional: Negative.  Negative for fever.  HENT: Positive for postnasal drip.   Respiratory: Positive for cough and shortness of breath.   Cardiovascular: Positive for chest pain.  Gastrointestinal: Negative.   Genitourinary: Negative.   Skin: Negative.   Neurological: Negative.   Psychiatric/Behavioral: Negative.   All other systems reviewed and are negative.   Allergies  Lisinopril and Mefloquine  Home Medications   Prior to Admission medications   Medication Sig Start Date End Date Taking? Authorizing Provider  acetaminophen (TYLENOL) 500 MG tablet Take 1,000 mg by mouth every 6 (six) hours as needed.    Historical Provider, MD  atorvastatin (LIPITOR) 40 MG tablet Take 40 mg by  mouth every evening.     Historical Provider, MD  bisacodyl (DULCOLAX) 10 MG suppository Place 1 suppository (10 mg total) rectally daily as needed. 01/20/13   Alexzandrew L Perkins, PA-C  cefdinir (OMNICEF) 300 MG capsule Take 1 capsule (300 mg total) by mouth 2 (two) times daily. 11/03/16   Janne Napoleon, NP  clonazePAM (KLONOPIN) 0.5 MG tablet Take 0.25-0.5 mg by mouth 3 (three) times daily as needed for anxiety. Takes 1/2 tablet twice daily then a whole tablet at bedtime    Historical Provider, MD  doxazosin (CARDURA) 2 MG tablet Take 1 mg by mouth at bedtime.    Historical Provider, MD  DUREZOL 0.05 % EMUL  09/10/16   Historical Provider, MD  ezetimibe (ZETIA) 10 MG tablet Take 10 mg by mouth every evening.     Historical Provider, MD  ferrous sulfate 325 (65 FE) MG tablet Take 325 mg by mouth every other day. On Monday, Wednesday and Friday    Historical Provider, MD  furosemide (LASIX) 20 MG tablet Take 10 mg by mouth daily.     Historical Provider, MD  gabapentin (NEURONTIN) 100 MG capsule Take 1 capsule (100 mg total) by mouth 3 (three) times daily. 09/24/16   Melvenia Beam, MD  losartan (COZAAR) 100 MG tablet Take 100 mg by mouth daily.    Historical Provider, MD  metoprolol (LOPRESSOR) 100 MG tablet Take 100 mg by mouth 2 (two) times daily.    Historical Provider, MD  NON FORMULARY CPAP qhs    Historical Provider, MD  omeprazole (PRILOSEC) 20 MG capsule Take 20 mg by mouth 2 (two) times daily.    Historical Provider, MD  PROLENSA 0.07 % SOLN Place into the left eye.  09/10/16   Historical Provider, MD  Skin Protectants, Misc. (EUCERIN) cream Apply 1 application topically daily as needed for dry skin.    Historical Provider, MD  SYNTHROID 88 MCG tablet Take 88 mcg by mouth daily. 05/07/15   Historical Provider, MD  traMADol (ULTRAM) 50 MG tablet Take 50 mg by mouth as needed.    Historical Provider, MD  trimethoprim-polymyxin b (POLYTRIM) ophthalmic solution  07/01/16   Historical Provider, MD   warfarin (COUMADIN) 5 MG tablet Take 1 to 1.5 tablets by mouth daily as directed by coumadin clinic 08/21/16   Sanda Klein, MD   Meds Ordered and Administered this Visit  Medications - No data to display  BP 152/76 (BP Location: Right Arm)   Pulse 88   Temp 98 F (36.7 C) (Oral)   Resp 20   SpO2 96%  No data found.   Physical Exam  Constitutional: He is oriented to person, place, and time. He appears well-developed and well-nourished. No distress.  HENT:  Head: Normocephalic and atraumatic.  Mouth/Throat: No oropharyngeal  exudate.  Bilateral TMs are normal. Oropharynx with minor erythema and clear PND. No source of bleeding.  Eyes: EOM are normal.  Neck: Normal range of motion. Neck supple.  Cardiovascular: Normal rate, regular rhythm and intact distal pulses.   Apical pulse bounding. PMI shifted to the left.  Pulmonary/Chest: Effort normal.  End expiratory crackles bilaterally in the lower fields. No wheezing.  Musculoskeletal: Normal range of motion. He exhibits no edema.  Lymphadenopathy:    He has no cervical adenopathy.  Neurological: He is alert and oriented to person, place, and time.  Skin: Skin is warm and dry.  Nursing note and vitals reviewed.   Urgent Care Course     Procedures (including critical care time)  Labs Review Labs Reviewed - No data to display  Imaging Review Dg Chest 2 View  Result Date: 11/03/2016 CLINICAL DATA:  Cough, wheezing for 3-4 days EXAM: CHEST  2 VIEW COMPARISON:  09/07/2016 FINDINGS: Bilateral diffuse interstitial thickening. Patchy airspace disease in the right lung base. No pleural effusion or pneumothorax. Stable cardiomegaly. Prior CABG. Two lead cardiac pacemaker. No acute osseous abnormality. IMPRESSION: 1. Diffuse interstitial thickening and mild cardiomegaly suggesting mild interstitial edema. More focal patchy airspace disease at the right lung base may reflect asymmetric edema versus pneumonia. Correlate with laboratory  values. Electronically Signed   By: Kathreen Devoid   On: 11/03/2016 14:41   Dg Abd 1 View  Result Date: 11/03/2016 CLINICAL DATA:  Abdominal bloating EXAM: ABDOMEN - 1 VIEW COMPARISON:  None. FINDINGS: Nonobstructive bowel gas pattern. Mild no changes the lumbar spine. Left hip arthroplasty. IMPRESSION: Unremarkable abdominal radiograph. Electronically Signed   By: Julian Hy M.D.   On: 11/03/2016 14:39     Visual Acuity Review  Right Eye Distance:   Left Eye Distance:   Bilateral Distance:    Right Eye Near:   Left Eye Near:    Bilateral Near:         MDM   1. Shortness of breath   2. Pulmonary infiltrate on chest x-ray   3. Cough    Start taking the antibiotic as directed. Also take a whole Lasix today. Ask your primary care provider tomorrow about your Lasix as to whether you should take one half tablet or more for the time being. He do have what looks like fluid build up on the lungs. There is some question as to whether he may have an infiltrate that may represent an infection at this time. For any worsening, new symptoms or problems may return Meds ordered this encounter  Medications  . cefdinir (OMNICEF) 300 MG capsule    Sig: Take 1 capsule (300 mg total) by mouth 2 (two) times daily.    Dispense:  14 capsule    Refill:  0    Order Specific Question:   Supervising Provider    Answer:   Carmela Hurt       Janne Napoleon, NP 11/03/16 [phone]

## 2016-11-03 NOTE — ED Notes (Signed)
Spoke with patient regarding lasix dosage, states that his doctor is out of the office until Monday. Patient is going to take one tablet today and if unable to get a hold of a PCP then will just take half a tablet. Patient states that he does not always take his lasix in the am. Patient and I discussed importance of taking lasix daily.

## 2016-11-03 NOTE — Discharge Instructions (Signed)
Start taking the antibiotic as directed. Also take a whole Lasix today. Ask your primary care provider tomorrow about your Lasix as to whether you should take one half tablet or more for the time being. He do have what looks like fluid build up on the lungs. There is some question as to whether he may have an infiltrate that may represent an infection at this time. For any worsening, new symptoms or problems may return

## 2016-11-03 NOTE — Telephone Encounter (Signed)
Could you please have him get a chest x-ray PA and lateral and have him get his INR checked earlier? Then first available visit with APP, Dr. Gwenlyn Found or myself. Thanks [employer]

## 2016-11-03 NOTE — Telephone Encounter (Signed)
Mr. Lary is calling because she has been coughing up blood .  Please call

## 2016-11-03 NOTE — ED Triage Notes (Signed)
The patient presented to the [facility] with a complaint of a cough and congestion x 5 days. The patient reported that he had some blood on his phlegm in 2 different occassions.

## 2016-11-04 ENCOUNTER — Ambulatory Visit (INDEPENDENT_AMBULATORY_CARE_PROVIDER_SITE_OTHER): Payer: Medicare Other | Admitting: Pharmacist

## 2016-11-04 DIAGNOSIS — I4891 Unspecified atrial fibrillation: Secondary | ICD-10-CM | POA: Diagnosis not present

## 2016-11-04 DIAGNOSIS — I482 Chronic atrial fibrillation: Secondary | ICD-10-CM

## 2016-11-04 DIAGNOSIS — Z7901 Long term (current) use of anticoagulants: Secondary | ICD-10-CM | POA: Diagnosis not present

## 2016-11-04 DIAGNOSIS — I4821 Permanent atrial fibrillation: Secondary | ICD-10-CM

## 2016-11-04 LAB — POCT INR: INR: 3.5

## 2016-11-10 ENCOUNTER — Ambulatory Visit
Admission: RE | Admit: 2016-11-10 | Discharge: 2016-11-10 | Disposition: A | Discharge status: Home / Self Care | Payer: Medicare Other | Source: Ambulatory Visit | Attending: Cardiovascular Disease | Admitting: Cardiovascular Disease

## 2016-11-10 ENCOUNTER — Ambulatory Visit (INDEPENDENT_AMBULATORY_CARE_PROVIDER_SITE_OTHER): Payer: Medicare Other | Admitting: Cardiovascular Disease

## 2016-11-10 ENCOUNTER — Encounter: Payer: Self-pay | Admitting: Cardiovascular Disease

## 2016-11-10 VITALS — BP 147/80 | HR 80 | Ht 71.0 in | Wt 255.2 lb

## 2016-11-10 DIAGNOSIS — J189 Pneumonia, unspecified organism: Secondary | ICD-10-CM

## 2016-11-10 DIAGNOSIS — I482 Chronic atrial fibrillation: Secondary | ICD-10-CM | POA: Diagnosis not present

## 2016-11-10 DIAGNOSIS — I25708 Atherosclerosis of coronary artery bypass graft(s), unspecified, with other forms of angina pectoris: Secondary | ICD-10-CM

## 2016-11-10 DIAGNOSIS — I4821 Permanent atrial fibrillation: Secondary | ICD-10-CM

## 2016-11-10 DIAGNOSIS — I442 Atrioventricular block, complete: Secondary | ICD-10-CM

## 2016-11-10 DIAGNOSIS — J181 Lobar pneumonia, unspecified organism: Secondary | ICD-10-CM

## 2016-11-10 DIAGNOSIS — Z7901 Long term (current) use of anticoagulants: Secondary | ICD-10-CM

## 2016-11-10 DIAGNOSIS — Z950 Presence of cardiac pacemaker: Secondary | ICD-10-CM

## 2016-11-10 DIAGNOSIS — I209 Angina pectoris, unspecified: Secondary | ICD-10-CM

## 2016-11-10 NOTE — Patient Instructions (Signed)
A chest x-ray takes a picture of the organs and structures inside the chest, including the heart, lungs, and blood vessels. This test can show several things, including, whether the heart is enlarges; whether fluid is building up in the lungs; and whether pacemaker / defibrillator leads are still in place. This will be performed at [place], [address].  Dr Sallyanne Kuster recommends that you schedule a follow-up appointment in 6 months. You will receive a reminder letter in the mail two months in advance. If you don't receive a letter, please call our office to schedule the follow-up appointment.  If you need a refill on your cardiac medications before your next appointment, please call your pharmacy.

## 2016-11-10 NOTE — Progress Notes (Signed)
Patient ID: Mike Price, male   DOB: 1935/01/10, 82 y.o.   MRN: [id number]    Cardiology Office Note    Date:  11/10/2016   ID:  Mike Price, DOB 10/26/1934, MRN [id number]  PCP:  Criselda Peaches, MD  Cardiologist:  Quay Burow, MD;  Sanda Klein, MD   Chief Complaint  Patient presents with  . Follow-up    History of Present Illness:  Mike Price is a 82 y.o. male with CAD s/p CABG 2001 (patent grafts 2008, low risk nuclear study 2015), complete heart block s/p PPM (2008, generator change December 2016 - Medtronic Adapta), permanent atrial fibrillation and hyperlipidemia. He is anticoagulated with warfarin, without CVA/TIA or bleeding problems.  Recently he had cough productive of blood-tinged sputum and was evaluated by Dr. Joseph Art at urgent care. Chest x-ray suggested "focal patchy airspace disease at the right lung base which may reflect asymmetric edema versus pneumonia". His dose of diuretics was increased and he was also prescribed an oral cephalosporin for possible pneumonia. He is still coughing although the sputum is now clear. The sputum was mucoid and sticky, rather than frothy. He does not have edema. By physical exam today he does not have leg edema or jugular venous distention. At 255 pounds his weight is actually lower than it was last March and last November (257-258 lb). (I doubt the accuracy of a weight of 243 pounds recorded on December 20).  His PM is functioning normally, good lead parameters, longevity 13.5 years, 99.7% V paced, no episodes of high ventricular rate. Recent device check was a remote download in December.  Echo 2015 showed EF 60-65%, nuclear study 2015, fixed apical defect, EF 56%  He denies any other CV complaints, syncope, falls. Denies current problems with fever or chills. No pleurisy reported. Both Mr. Writt and his wife have had repeated respiratory infections this winter.  Past Medical History:  Diagnosis Date  . Anemia   .  Anxiety 01-11-13   tx. Clonazepam.  . Arthritis    Osteoarthritis-knees, fingers  . CAD (coronary artery disease)   . Dysrhythmia 01-11-13    hx. A. Fib-Pacemaker implanted left chest  . Edema extremities 01-11-13   retains fluid in legs-right greater than left.  [name] Kitchen GERD (gastroesophageal reflux disease)   . Hyperlipemia   . Hypertension   . Hypothyroidism   [name] 07/10/2015   IgA 542 mg% 06/19/15. IgA lambda paraprotein on IFE, normal IgG & IgM  . Neuromuscular disorder (HCC)    neuropathy in feet  . Obstructive sleep apnea    on C Pap  . Pacemaker 12/15/06   3'08  . Thyroid disease   . TMJ tenderness 01-11-13   right side-has issues from time to time.    Past Surgical History:  Procedure Laterality Date  . APPENDECTOMY    . CARDIAC CATHETERIZATION  03/28/08   patent grafts, nl EF  . CERVICAL LAMINECTOMY    . CORONARY ARTERY BYPASS GRAFT  2001   4 vessels-'00  . EP IMPLANTABLE DEVICE N/A 09/11/2015   Procedure: PPM Generator Changeout;  Surgeon: Sanda Klein, MD;  Location: [facility];  Service: Cardiovascular;  Laterality: N/A;  . HERNIA REPAIR    . INSERT / REPLACE / REMOVE PACEMAKER     '08-inserted left chest  . JOINT REPLACEMENT     LTHA  . NM MYOCAR PERF WALL MOTION  02/12/2012   small fixed distal anteroapical/apical defect. No reversible  ischemia  . PACEMAKER INSERTION  12/15/06   medtronic  . TONSILLECTOMY     child  . TOTAL KNEE ARTHROPLASTY Right 01/17/2013   Procedure: RIGHT TOTAL KNEE ARTHROPLASTY;  Surgeon: Gearlean Alf, MD;  Location: [facility];  Service: Orthopedics;  Laterality: Right;  . US ECHOCARDIOGRAPHY  12/28/2007   LA mod. dilated,RA mildly dilated    Outpatient Medications Prior to Visit  Medication Sig Dispense Refill  . acetaminophen (TYLENOL) 500 MG tablet Take 1,000 mg by mouth every 6 (six) hours as needed.    [name] Kitchen atorvastatin (LIPITOR) 40 MG tablet Take 40 mg by mouth every evening.     .  bisacodyl (DULCOLAX) 10 MG suppository Place 1 suppository (10 mg total) rectally daily as needed. 12 suppository 0  . cefdinir (OMNICEF) 300 MG capsule Take 1 capsule (300 mg total) by mouth 2 (two) times daily. 14 capsule 0  . clonazePAM (KLONOPIN) 0.5 MG tablet Take 0.25-0.5 mg by mouth 3 (three) times daily as needed for anxiety. Takes 1/2 tablet twice daily then a whole tablet at bedtime    . doxazosin (CARDURA) 2 MG tablet Take 1 mg by mouth at bedtime.    . DUREZOL 0.05 % EMUL     . ezetimibe (ZETIA) 10 MG tablet Take 10 mg by mouth every evening.     . ferrous sulfate 325 (65 FE) MG tablet Take 325 mg by mouth every other day. On Monday, Wednesday and Friday    . furosemide (LASIX) 20 MG tablet Take 10 mg by mouth daily.     [name] Kitchen gabapentin (NEURONTIN) 100 MG capsule Take 1 capsule (100 mg total) by mouth 3 (three) times daily. 270 capsule 5  . losartan (COZAAR) 100 MG tablet Take 100 mg by mouth daily.    . metoprolol (LOPRESSOR) 100 MG tablet Take 100 mg by mouth 2 (two) times daily.    . NON FORMULARY CPAP qhs    . omeprazole (PRILOSEC) 20 MG capsule Take 20 mg by mouth 2 (two) times daily.    [name] Kitchen PROLENSA 0.07 % SOLN Place into the left eye.     . Skin Protectants, Misc. (EUCERIN) cream Apply 1 application topically daily as needed for dry skin.    [name] Kitchen SYNTHROID 88 MCG tablet Take 88 mcg by mouth daily.    . traMADol (ULTRAM) 50 MG tablet Take 50 mg by mouth as needed.    . trimethoprim-polymyxin b (POLYTRIM) ophthalmic solution     . warfarin (COUMADIN) 5 MG tablet Take 1 to 1.5 tablets by mouth daily as directed by coumadin clinic 120 tablet 1   No facility-administered medications prior to visit.      Allergies:   Lisinopril and Mefloquine   Social History   Social History  . Marital status: Married    Spouse name: Mike Price  . Number of children: N/A  . 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 Smoking status: Former Smoker     Quit date: 01/12/1984  . Smokeless tobacco: Never Used  . Alcohol use 1.8 oz/week    3 Shots of liquor per week     Comment:  3 ounces daily  . Drug use: No  . Sexual activity: Not Asked   Other Topics Concern  . None   Social History Narrative   Lives at home with wife, Mike Price   Caffeine use: 2 cups coffee per day  ROS:   Please see the history of present illness.    ROS All other systems reviewed and are negative.   PHYSICAL EXAM:   VS:  BP (!) 147/80 (BP Location: Right Arm, Patient Position: Sitting, Cuff Size: Normal)   Pulse 80   Ht 5\' 11"  (1.803 m)   Wt 115.8 kg (255 lb 3.2 oz)   BMI 35.59 kg/m    GEN: Well nourished, well developed, in no acute distress  HEENT: normal  Neck: no JVD, carotid bruits, or masses Cardiac: paradoxically split S2, RRR; no murmurs, rubs, or gallops,no edema , well healed pacemaker site Respiratory:  dry crackles that do not clear with cough heard exclusively in the right lower lung, normal work of breathing GI: soft, nontender, nondistended, + BS MS: no deformity or atrophy  Skin: warm and dry, no rash Neuro:  Alert and Oriented x 3, Strength and sensation are intact Psych: euthymic mood, full affect  Wt Readings from Last 3 Encounters:  11/10/16 115.8 kg (255 lb 3.2 oz)  09/24/16 110.2 kg (243 lb)  08/12/16 117.1 kg (258 lb 1.6 oz)      Studies/Labs Reviewed:   EKG:  EKG is ordered today. It shows atrial fibrillation, 100% ventricular pacing Recent Labs: 08/05/2016: Platelets 151     ASSESSMENT:    1. CHB (complete heart block) ([place])   2. Pneumonia of right lower lobe due to infectious organism ([place])   3. Permanent atrial fibrillation ([place])   4. Pacemaker   5. Coronary artery disease involving coronary bypass graft of native heart with other forms of angina pectoris ([place])   6. Long term current use of anticoagulant therapy      PLAN:  In order of problems listed above:  1. CHB: Occasional native AV  conduction, but has 100% V pacing and is best considered pacemaker dependent. 2. Pneumonia, RLL: Physical exam shows unilateral crackles and I do not find any physical findings suggesting hypervolemia today. I think it is more likely that he had pneumonia rather than heart failure (or maybe he had both). Will recheck a chest x-ray. Wonder what we should expand his antibiotic coverage to include atypicals.  3. AFib:  No high V rates. CHADSVasc 4 (age 13, CAD, HTN). Anticoagulants. 4. PPM: Normal device function; Carelink downloads Q 3 mos, office visit yearly. 5. CAD s/p CABG: asymptomatic on current meds 6. Warfarin: No bleeding complications. No recent falls.    Medication Adjustments/Labs and Tests Ordered: Current medicines are reviewed at length with the patient today.  Concerns regarding medicines are outlined above.  Medication changes, Labs and Tests ordered today are listed in the Patient Instructions below. Patient Instructions  A chest x-ray takes a picture of the organs and structures inside the chest, including the heart, lungs, and blood vessels. This test can show several things, including, whether the heart is enlarges; whether fluid is building up in the lungs; and whether pacemaker / defibrillator leads are still in place. This will be performed at [place], [address].  Dr Sallyanne Kuster recommends that you schedule a follow-up appointment in 6 months. You will receive a reminder letter in the mail two months in advance. If you don't receive a letter, please call our office to schedule the follow-up appointment.  If you need a refill on your cardiac medications before your next appointment, please call your pharmacy.      Signed, Sanda Klein, MD  11/10/2016 1:33 PM    [facility] [address], [place], [place]  [address] Phone: [phone]; Fax: [phone]

## 2016-11-11 ENCOUNTER — Telehealth: Payer: Self-pay | Admitting: Cardiovascular Disease

## 2016-11-11 MED ORDER — AZITHROMYCIN 500 MG PO TABS: ORAL_TABLET | ORAL | 0 refills | Status: DC

## 2016-11-11 NOTE — Telephone Encounter (Signed)
New Message  Pt voiced wanting to know what does a chest xray reveal.  Please f/u

## 2016-11-11 NOTE — Telephone Encounter (Signed)
Communicated recommendations to patient. He voiced thanks and will start med & follow up w his PCP. Rx sent to his preferred local pharmacy [facility] He has appt later this week w PCP to further assess symptoms. Aware if he or provider need anything further for which we may assist, give our office a call. He voiced thanks for help.

## 2016-11-11 NOTE — Telephone Encounter (Signed)
Spoke to patient. Gave recommendations regarding recent test. I've faxed the CXR findings to Dr. Rolly Salter office. Have advised that if Dr. Nyoka Cowden is still out of town to call us back in case we can advise further. Pt voiced understanding and thanks. Notes he is still having the productive cough w bloody sputum.

## 2016-11-11 NOTE — Telephone Encounter (Signed)
Have him take azithromycin 500 mg once daily for 3 days please. He can also finish off his other antibiotic (cefdinir) as well. MCr

## 2016-11-12 ENCOUNTER — Telehealth: Payer: Self-pay | Admitting: Cardiovascular Disease

## 2016-11-12 NOTE — Telephone Encounter (Signed)
New message      Calling to let Dr Sallyanne Kuster know that he had a pneumonia shot a long time ago.  He received a robo call inquiring about pneumonia shot and he also recently had a visit with Dr Sallyanne Kuster.  Pt states that his PCP, Dr Zada Girt, gave him the pneumonia shot.  Do you think he needs to get another one?  Please call

## 2016-11-12 NOTE — Telephone Encounter (Signed)
Spoke w patient. Recommended he contact Dr. Rolly Salter office to determine if, based on last date of his PNA vaccine, he should get another one. He does not recall last time this was done - states has been more than a year but does not know how long. Advised that Dr. Nyoka Cowden should have this information, and that once he gets the shot or advice on when to have it done, let us know, we can update our records to reflect accurate vaccination history. Pt voiced understanding and thanks.

## 2016-11-13 ENCOUNTER — Telehealth: Payer: Self-pay | Admitting: Cardiovascular Disease

## 2016-11-13 NOTE — Telephone Encounter (Signed)
Pneumonia vaccine information updated in EPIC

## 2016-11-13 NOTE — Telephone Encounter (Signed)
New message  Pt call requesting to speak with RN. Pt call to give pneumonia injection dates: 03/2008 and 07/2014. Please call back to discuss if needed

## 2016-11-18 ENCOUNTER — Ambulatory Visit (INDEPENDENT_AMBULATORY_CARE_PROVIDER_SITE_OTHER): Payer: Medicare Other | Admitting: Pharmacist

## 2016-11-18 DIAGNOSIS — Z7901 Long term (current) use of anticoagulants: Secondary | ICD-10-CM | POA: Diagnosis not present

## 2016-11-18 DIAGNOSIS — I482 Chronic atrial fibrillation: Secondary | ICD-10-CM | POA: Diagnosis not present

## 2016-11-18 DIAGNOSIS — I4821 Permanent atrial fibrillation: Secondary | ICD-10-CM

## 2016-11-18 DIAGNOSIS — I25708 Atherosclerosis of coronary artery bypass graft(s), unspecified, with other forms of angina pectoris: Secondary | ICD-10-CM

## 2016-11-18 DIAGNOSIS — I4891 Unspecified atrial fibrillation: Secondary | ICD-10-CM | POA: Diagnosis not present

## 2016-11-18 LAB — POCT INR: INR: 2

## 2016-11-24 ENCOUNTER — Telehealth: Payer: Self-pay | Admitting: Cardiology

## 2016-11-24 NOTE — Telephone Encounter (Signed)
Pt called and wanted to know if he had a ICD in his PPM. Informed pt that he only has a PPM and no defib. [place] to what felt like a hit to the chest. Instructed pt to send a remote transmission w/ his monitor and once it is received we will review it and call him back. Pt verbalized understanding.

## 2016-11-25 NOTE — Telephone Encounter (Signed)
Remote transmission reviewed. Presenting rhythm: AF/Vp. (1)VHR episode on 2/16 x 6bts @ 213bpm. Normal device function.  Patient states that he had an episode yesterday that woke him up from his sleep. Patient says that it felt like he was hit in the chest. I informed patient that he has a ppm and not an ICD, so his device can't deliver shocks. I told him that his device function is stable and that he did not have any episodes yesterday. Patient voiced understanding and appreciation of information.

## 2016-11-27 NOTE — Progress Notes (Signed)
That would be great, thank you! MCr

## 2016-12-10 ENCOUNTER — Ambulatory Visit: Payer: Medicare Other | Admitting: Cardiovascular Disease

## 2016-12-12 ENCOUNTER — Ambulatory Visit (INDEPENDENT_AMBULATORY_CARE_PROVIDER_SITE_OTHER): Payer: Medicare Other | Admitting: Pharmacist Clinician (PhC)/ Clinical Pharmacy Specialist

## 2016-12-12 ENCOUNTER — Encounter: Payer: Self-pay | Admitting: Cardiovascular Disease

## 2016-12-12 ENCOUNTER — Ambulatory Visit (INDEPENDENT_AMBULATORY_CARE_PROVIDER_SITE_OTHER): Payer: Medicare Other | Admitting: Cardiovascular Disease

## 2016-12-12 DIAGNOSIS — E7800 Pure hypercholesterolemia, unspecified: Secondary | ICD-10-CM | POA: Diagnosis not present

## 2016-12-12 DIAGNOSIS — I10 Essential (primary) hypertension: Secondary | ICD-10-CM | POA: Diagnosis not present

## 2016-12-12 DIAGNOSIS — I4891 Unspecified atrial fibrillation: Secondary | ICD-10-CM | POA: Diagnosis not present

## 2016-12-12 DIAGNOSIS — Z7901 Long term (current) use of anticoagulants: Secondary | ICD-10-CM | POA: Diagnosis not present

## 2016-12-12 DIAGNOSIS — I482 Chronic atrial fibrillation: Secondary | ICD-10-CM

## 2016-12-12 DIAGNOSIS — I4821 Permanent atrial fibrillation: Secondary | ICD-10-CM

## 2016-12-12 DIAGNOSIS — I25700 Atherosclerosis of coronary artery bypass graft(s), unspecified, with unstable angina pectoris: Secondary | ICD-10-CM | POA: Diagnosis not present

## 2016-12-12 DIAGNOSIS — I200 Unstable angina: Secondary | ICD-10-CM

## 2016-12-12 LAB — POCT INR: INR: 3

## 2016-12-12 NOTE — Assessment & Plan Note (Signed)
History of hypertension blood pressure measures 124/68. He is on losartan and metoprolol. Continue current meds at current dosing

## 2016-12-12 NOTE — Assessment & Plan Note (Signed)
History of hyperlipidemia on statin therapy followed by his PCP 

## 2016-12-12 NOTE — Assessment & Plan Note (Signed)
History of coronary artery disease status post bypass grafting in 2001. His last Myoview stress test performed 05/25/14 was low risk and nonischemic. He denies chest pain or shortness of breath.

## 2016-12-12 NOTE — Patient Instructions (Signed)
Medication Instructions: Your physician recommends that you continue on your current medications as directed. Please refer to the Current Medication list given to you today.  Labwork:  I will request labs from Dr. Nyoka Cowden.   Follow-Up: Your physician wants you to follow-up in: 1 year with Dr. Gwenlyn Found. You will receive a reminder letter in the mail two months in advance. If you don't receive a letter, please call our office to schedule the follow-up appointment.  If you need a refill on your cardiac medications before your next appointment, please call your pharmacy.

## 2016-12-12 NOTE — Assessment & Plan Note (Signed)
History of permanent atrial fibrillation status post permanent transvenous pacemaker insertion by Dr. Tami Ribas 01/24/08 on Coumadin anticoagulation followed by Dr. Sallyanne Kuster.

## 2016-12-12 NOTE — Progress Notes (Signed)
12/12/2016 Mike Price   1935/04/22  [id number]  Primary Physician GREEN, [name], MD Primary Cardiologist: Lorretta Harp MD [name]  HPI:  The patient is a 82 year old moderately overweight married Caucasian male father of 1 child, grandfather to 54 step-grandchildren who I last saw 11/27/15. He has a history of coronary artery bypass grafting in 2001. He had a permanent transvenous pacemaker placed for PAF and underwent cardioversion by Dr. Tami Ribas January 24, 2008. His other problems include hypertension and hyperlipidemia. He was cathed by Dr. Melvern Banker March 28, 2008 revealing patent grafts with normal LV function. 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 He denies chest pain or shortness of breath. He had a left total hip replacement in October of 2011. Myoview performed a year ago was nonischemic.several months he's noticed increasing dyspnea on exertion. He saw Dr. Nyoka Cowden, his primary care physician, who referred him back for further evaluation. Apparently shortness of breath as his presenting symptom prior to coronary artery bypass grafting.a Myoview stress test performed 05/25/14 was low risk and nonischemic.  He is on C Pap for obstructive sleep apnea. Since I saw him a year ago he underwent permanent transvenous pacemaker generator change out by Dr. Sallyanne Kuster  09/11/15. During our conversation, patient described BLE discomfort with some qualities c/w claudication. Lower extremity arterial Doppler studies performed 12/05/15 were normal. Since I saw him a year ago he's remained clinically stable.   Current Outpatient Prescriptions  Medication Sig Dispense Refill  . acetaminophen (TYLENOL) 500 MG tablet Take 1,000 mg by mouth every 6 (six) hours as needed.    [name] Kitchen atorvastatin (LIPITOR) 40 MG tablet Take 40 mg by mouth every evening.     . bisacodyl (DULCOLAX) 10 MG suppository Place 1 suppository (10 mg  total) rectally daily as needed. 12 suppository 0  . clonazePAM (KLONOPIN) 0.5 MG tablet Take 0.25-0.5 mg by mouth 3 (three) times daily as needed for anxiety. Takes 1/2 tablet twice daily then a whole tablet at bedtime    . doxazosin (CARDURA) 2 MG tablet Take 1 mg by mouth at bedtime.    [name] Kitchen ezetimibe (ZETIA) 10 MG tablet Take 10 mg by mouth every evening.     . ferrous sulfate 325 (65 FE) MG tablet Take 325 mg by mouth every other day. On Monday, Wednesday and Friday    . furosemide (LASIX) 20 MG tablet Take 10 mg by mouth daily.     [name] Kitchen gabapentin (NEURONTIN) 100 MG capsule Take 1 capsule (100 mg total) by mouth 3 (three) times daily. 270 capsule 5  . losartan (COZAAR) 100 MG tablet Take 100 mg by mouth daily.    . metoprolol (LOPRESSOR) 100 MG tablet Take 100 mg by mouth 2 (two) times daily.    . NON FORMULARY CPAP qhs    . omeprazole (PRILOSEC) 20 MG capsule Take 20 mg by mouth 2 (two) times daily.    . Skin Protectants, Misc. (EUCERIN) cream Apply 1 application topically daily as needed for dry skin.    [name] Kitchen SYNTHROID 88 MCG tablet Take 88 mcg by mouth daily.    . traMADol (ULTRAM) 50 MG tablet Take 50 mg by mouth as needed.    . warfarin (COUMADIN) 5 MG tablet Take 1 to 1.5 tablets by mouth daily as directed by coumadin clinic 120 tablet 1   No current facility-administered medications for this visit.     Allergies  Allergen Reactions  .  Lisinopril Cough  . Mefloquine Other (See Comments)    "made me crazy" anxious, upset    Social History   Social History  . Marital status: Married    Spouse name: Faith  . Number of children: N/A  . 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 Smoking status: Former Smoker    Quit date: 01/12/1984  . Smokeless tobacco: Never Used  . Alcohol use 1.8 oz/week    3 Shots of liquor per week     Comment:  3 ounces daily  . Drug use: No  . Sexual activity: Not on file   Other Topics Concern  . Not  on file   Social History Narrative   Lives at home with wife, Faith   Caffeine use: 2 cups coffee per day        Review of Systems: General: negative for chills, fever, night sweats or weight changes.  Cardiovascular: negative for chest pain, dyspnea on exertion, edema, orthopnea, palpitations, paroxysmal nocturnal dyspnea or shortness of breath Dermatological: negative for rash Respiratory: negative for cough or wheezing Urologic: negative for hematuria Abdominal: negative for nausea, vomiting, diarrhea, bright red blood per rectum, melena, or hematemesis Neurologic: negative for visual changes, syncope, or dizziness All other systems reviewed and are otherwise negative except as noted above.    Blood pressure 124/68, pulse 72, height 5\' 11"  (1.803 m), weight 256 lb (116.1 kg), SpO2 90 %.  General appearance: alert and no distress Neck: no adenopathy, no carotid bruit, no JVD, supple, symmetrical, trachea midline and thyroid not enlarged, symmetric, no tenderness/mass/nodules Lungs: clear to auscultation bilaterally Heart: regular rate and rhythm, S1, S2 normal, no murmur, click, rub or gallop Extremities: extremities normal, atraumatic, no cyanosis or edema  EKG not performed today  ASSESSMENT AND PLAN:   Permanent atrial fibrillation (HCC) History of permanent atrial fibrillation status post permanent transvenous pacemaker insertion by Dr. Tami Ribas 01/24/08 on Coumadin anticoagulation followed by Dr. Sallyanne Kuster.  CAD (coronary artery disease) of artery bypass graft History of coronary artery disease status post bypass grafting in 2001. His last Myoview stress test performed 05/25/14 was low risk and nonischemic. He denies chest pain or shortness of breath.  HTN (hypertension) History of hypertension blood pressure measures 124/68. He is on losartan and metoprolol. Continue current meds at current dosing  Hyperlipidemia History of hyperlipidemia on statin therapy followed by his  PCP      Lorretta Harp MD [facility], [facility] 12/12/2016 3:13 PM

## 2016-12-23 ENCOUNTER — Other Ambulatory Visit: Payer: Self-pay | Admitting: Pharmacist Clinician (PhC)/ Clinical Pharmacy Specialist

## 2016-12-23 ENCOUNTER — Telehealth: Payer: Self-pay | Admitting: Cardiovascular Disease

## 2016-12-23 MED ORDER — WARFARIN SODIUM 5 MG PO TABS: ORAL_TABLET | ORAL | 1 refills | Status: DC

## 2016-12-23 NOTE — Telephone Encounter (Signed)
New Message   *STAT* If patient is at the pharmacy, call can be transferred to refill team.   1. Which medications need to be refilled? (please list name of each medication and dose if known) warfarin 5mg    2. Which pharmacy/location (including street and city if local pharmacy) is medication to be sent to? [facility]  3. Do they need a 30 day or 90 day supply?

## 2016-12-23 NOTE — Telephone Encounter (Signed)
Called rx to [facility] for 3 month supply - advised patient that insurance may not pay for rx.  Can't find current bottle, he suspects the cat knocked it off the shelf and it rolled under something.

## 2016-12-24 ENCOUNTER — Ambulatory Visit (INDEPENDENT_AMBULATORY_CARE_PROVIDER_SITE_OTHER): Payer: Medicare Other | Admitting: Cardiovascular Disease

## 2016-12-24 ENCOUNTER — Encounter: Payer: Self-pay | Admitting: Cardiovascular Disease

## 2016-12-24 VITALS — BP 120/80 | HR 64 | Ht 71.0 in | Wt 255.0 lb

## 2016-12-24 DIAGNOSIS — I442 Atrioventricular block, complete: Secondary | ICD-10-CM

## 2016-12-24 DIAGNOSIS — I25708 Atherosclerosis of coronary artery bypass graft(s), unspecified, with other forms of angina pectoris: Secondary | ICD-10-CM | POA: Diagnosis not present

## 2016-12-24 DIAGNOSIS — Z7901 Long term (current) use of anticoagulants: Secondary | ICD-10-CM

## 2016-12-24 DIAGNOSIS — I482 Chronic atrial fibrillation: Secondary | ICD-10-CM

## 2016-12-24 DIAGNOSIS — I4729 Other ventricular tachycardia: Secondary | ICD-10-CM | POA: Insufficient documentation

## 2016-12-24 DIAGNOSIS — Z950 Presence of cardiac pacemaker: Secondary | ICD-10-CM

## 2016-12-24 DIAGNOSIS — I4821 Permanent atrial fibrillation: Secondary | ICD-10-CM

## 2016-12-24 DIAGNOSIS — I472 Ventricular tachycardia: Secondary | ICD-10-CM | POA: Diagnosis not present

## 2016-12-24 DIAGNOSIS — I25700 Atherosclerosis of coronary artery bypass graft(s), unspecified, with unstable angina pectoris: Secondary | ICD-10-CM

## 2016-12-24 DIAGNOSIS — I209 Angina pectoris, unspecified: Secondary | ICD-10-CM

## 2016-12-24 LAB — CUP PACEART INCLINIC DEVICE CHECK
Battery Impedance: 112 Ohm
Battery Remaining Longevity: 162 mo
Battery Voltage: 2.79 V
Brady Statistic RV Percent Paced: 100 %
Date Time Interrogation Session: 20180321135315
Implantable Lead Implant Date: 20080311
Implantable Lead Location: 753859
Implantable Lead Model: 5076
Implantable Pulse Generator Implant Date: 20161206
Lead Channel Impedance Value: 67 Ohm
Lead Channel Impedance Value: 757 Ohm
Lead Channel Pacing Threshold Amplitude: 1 V
Lead Channel Pacing Threshold Amplitude: 1.125 V
Lead Channel Pacing Threshold Pulse Width: 0.4 ms
Lead Channel Setting Pacing Pulse Width: 0.4 ms
Lead Channel Setting Sensing Sensitivity: 2.8 mV
MDC IDC LEAD IMPLANT DT: 20080311
MDC IDC LEAD LOCATION: 753860
MDC IDC MSMT LEADCHNL RV PACING THRESHOLD PULSEWIDTH: 0.4 ms
MDC IDC SET LEADCHNL RV PACING AMPLITUDE: 2.25 V

## 2016-12-24 NOTE — Patient Instructions (Signed)
Dr Sallyanne Kuster recommends that you continue on your current medications as directed. Please refer to the Current Medication list given to you today.  Remote monitoring is used to monitor your Pacemaker of ICD from home. This monitoring reduces the number of office visits required to check your device to one time per year. It allows [place] to keep an eye on the functioning of your device to ensure it is working properly. You are scheduled for a device check from home on Wednesday, June 20th, 2018. You may send your transmission at any time that day. If you have a wireless device, the transmission will be sent automatically. After your physician reviews your transmission, you will receive a postcard with your next transmission date.  Dr Sallyanne Kuster recommends that you schedule a follow-up appointment in 12 months with a pacemaker check. You will receive a reminder letter in the mail two months in advance. If you don't receive a letter, please call our office to schedule the follow-up appointment.  If you need a refill on your cardiac medications before your next appointment, please call your pharmacy.

## 2016-12-24 NOTE — Progress Notes (Signed)
Patient ID: Mike Price, male   DOB: 1935-03-05, 82 y.o.   MRN: [id number]    Cardiology Office Note    Date:  12/24/2016   ID:  Mike Price, DOB [birth year], MRN [id number]  PCP:  Criselda Peaches, MD  Cardiologist:  Quay Burow, MD;  Sanda Klein, MD   Chief Complaint  Patient presents with  . Follow-up    pt has no complaints     History of Present Illness:  Mike Price is a 82 y.o. male with CAD s/p CABG 2001 (patent grafts 2008, low risk nuclear study 2015), complete heart block s/p PPM (2008, generator change December 2016 - Medtronic Adapta), permanent atrial fibrillation and hyperlipidemia. He is anticoagulated with warfarin, without CVA/TIA or bleeding problems.  He had a protracted course pneumonia about 6 weeks ago and required a change in antibiotics, but now feels fully recovered. He denies dyspnea, angina, palpitations, syncope, cough, edema or other current cardiovascular complaints. Unfortunately his wife also had a respiratory infection complicated by an attack of shingles with involvement of the cranial nerves and has a lot of problem with residual neuralgia and hearing loss.  His PM is functioning normally, good lead parameters, longevity 13.5 years, 99.6% V paced,  a single 6 beat run of nonsustained ventricular tachycardia recorded on February 16.  Echo 2015 showed EF 60-65%, nuclear study 2015, fixed apical defect, EF 56%   Past Medical History:  Diagnosis Date  . Anemia   . Anxiety 01-11-13   tx. Clonazepam.  . Arthritis    Osteoarthritis-knees, fingers  . CAD (coronary artery disease)   . Dysrhythmia 01-11-13    hx. A. Fib-Pacemaker implanted left chest  . Edema extremities 01-11-13   retains fluid in legs-right greater than left.  [name] Kitchen GERD (gastroesophageal reflux disease)   . Hyperlipemia   . Hypertension   . Hypothyroidism   [name] 07/10/2015   IgA 542 mg% 06/19/15. IgA lambda paraprotein on IFE,  normal IgG & IgM  . Neuromuscular disorder (HCC)    neuropathy in feet  . Obstructive sleep apnea    on C Pap  . Pacemaker 12/15/06   3'08  . Thyroid disease   . TMJ tenderness 01-11-13   right side-has issues from time to time.    Past Surgical History:  Procedure Laterality Date  . APPENDECTOMY    . CARDIAC CATHETERIZATION  03/28/08   patent grafts, nl EF  . CERVICAL LAMINECTOMY    . CORONARY ARTERY BYPASS GRAFT  2001   4 vessels-'00  . EP IMPLANTABLE DEVICE N/A 09/11/2015   Procedure: PPM Generator Changeout;  Surgeon: Sanda Klein, MD;  Location: [facility];  Service: Cardiovascular;  Laterality: N/A;  . HERNIA REPAIR    . INSERT / REPLACE / REMOVE PACEMAKER     '08-inserted left chest  . JOINT REPLACEMENT     LTHA  . NM MYOCAR PERF WALL MOTION  02/12/2012   small fixed distal anteroapical/apical defect. No reversible ischemia  . PACEMAKER INSERTION  12/15/06   medtronic  . TONSILLECTOMY     child  . TOTAL KNEE ARTHROPLASTY Right 01/17/2013   Procedure: RIGHT TOTAL KNEE ARTHROPLASTY;  Surgeon: Gearlean Alf, MD;  Location: [facility];  Service: Orthopedics;  Laterality: Right;  . US ECHOCARDIOGRAPHY  12/28/2007   LA mod. dilated,RA mildly dilated    Outpatient Medications Prior to Visit  Medication Sig Dispense Refill  . acetaminophen (TYLENOL) 500 MG tablet  Take 1,000 mg by mouth every 6 (six) hours as needed.    [name] Kitchen atorvastatin (LIPITOR) 40 MG tablet Take 40 mg by mouth every evening.     . bisacodyl (DULCOLAX) 10 MG suppository Place 1 suppository (10 mg total) rectally daily as needed. 12 suppository 0  . clonazePAM (KLONOPIN) 0.5 MG tablet Take 0.25-0.5 mg by mouth 3 (three) times daily as needed for anxiety. Takes 1/2 tablet twice daily then a whole tablet at bedtime    . doxazosin (CARDURA) 2 MG tablet Take 1 mg by mouth at bedtime.    [name] Kitchen ezetimibe (ZETIA) 10 MG tablet Take 10 mg by mouth every evening.     . ferrous sulfate 325 (65 FE) MG tablet Take 325 mg by  mouth every other day. On Monday, Wednesday and Friday    . furosemide (LASIX) 20 MG tablet Take 10 mg by mouth daily.     [name] Kitchen gabapentin (NEURONTIN) 100 MG capsule Take 1 capsule (100 mg total) by mouth 3 (three) times daily. 270 capsule 5  . losartan (COZAAR) 100 MG tablet Take 100 mg by mouth daily.    . metoprolol (LOPRESSOR) 100 MG tablet Take 100 mg by mouth 2 (two) times daily.    . NON FORMULARY CPAP qhs    . omeprazole (PRILOSEC) 20 MG capsule Take 20 mg by mouth 2 (two) times daily.    . Skin Protectants, Misc. (EUCERIN) cream Apply 1 application topically daily as needed for dry skin.    [name] Kitchen SYNTHROID 88 MCG tablet Take 88 mcg by mouth daily.    . traMADol (ULTRAM) 50 MG tablet Take 50 mg by mouth as needed.    . warfarin (COUMADIN) 5 MG tablet Take 1 to 1.5 tablets by mouth daily as directed by coumadin clinic 120 tablet 1   No facility-administered medications prior to visit.      Allergies:   Lisinopril and Mefloquine   Social History   Social History  . Marital status: Married    Spouse name: Faith  . Number of children: N/A  . 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 Smoking status: Former Smoker    Quit date: 01/12/1984  . Smokeless tobacco: Never Used  . Alcohol use 1.8 oz/week    3 Shots of liquor per week     Comment:  3 ounces daily  . Drug use: No  . Sexual activity: Not Asked   Other Topics Concern  . None   Social History Narrative   Lives at home with wife, Faith   Caffeine use: 2 cups coffee per day        ROS:   Please see the history of present illness.    ROS All other systems reviewed and are negative.   PHYSICAL EXAM:   VS:  BP 120/80 (BP Location: Right Arm, Patient Position: Sitting, Cuff Size: Normal)   Pulse 64   Ht 5\' 11"  (1.803 m)   Wt 115.7 kg (255 lb)   BMI 35.57 kg/m    GEN: Well nourished, well developed, in no acute distress  HEENT: normal  Neck: no JVD, carotid bruits,  or masses Cardiac: paradoxically split S2, RRR; no murmurs, rubs, or gallops,no edema , well healed pacemaker site Respiratory:  Clear to auscultation bilaterally normal work of breathing GI: soft, nontender, nondistended, + BS MS: no deformity or atrophy  Skin: warm and dry, no rash Neuro:  Alert  and Oriented x 3, Strength and sensation are intact Psych: euthymic mood, full affect  Wt Readings from Last 3 Encounters:  12/24/16 115.7 kg (255 lb)  12/12/16 116.1 kg (256 lb)  11/10/16 115.8 kg (255 lb 3.2 oz)      Studies/Labs Reviewed:   EKG:  EKG is ordered today. It shows atrial fibrillation, 100% ventricular pacing Recent Labs: 08/05/2016: Platelets 151     ASSESSMENT:    1. Permanent atrial fibrillation ([place])   2. NSVT (nonsustained ventricular tachycardia) ([place])   3. CHB (complete heart block) (HCC)   4. Pacemaker   5. Coronary artery disease involving coronary bypass graft of native heart with other forms of angina pectoris ([place])   6. Long term current use of anticoagulant therapy      PLAN:  In order of problems listed above:  1. AFib:  No high V rates. CHADSVasc 4 (age 80, CAD, HTN). Anticoagulants. 2. NSVT: Infrequent and asymptomatic events recorded by his pacemaker 3. CHB: Occasional native AV conduction, but has 100% V pacing and is best considered pacemaker dependent. 4. PPM: Normal device function; Carelink downloads Q 3 mos, office visit yearly. 5. CAD s/p CABG: asymptomatic on current meds 6. Warfarin: No bleeding complications. No recent falls.    Medication Adjustments/Labs and Tests Ordered: Current medicines are reviewed at length with the patient today.  Concerns regarding medicines are outlined above.  Medication changes, Labs and Tests ordered today are listed in the Patient Instructions below. Patient Instructions  Dr Sallyanne Kuster recommends that you continue on your current medications as directed. Please refer to the Current Medication list given  to you today.  Remote monitoring is used to monitor your Pacemaker of ICD from home. This monitoring reduces the number of office visits required to check your device to one time per year. It allows [place] to keep an eye on the functioning of your device to ensure it is working properly. You are scheduled for a device check from home on Wednesday, June 20th, 2018. You may send your transmission at any time that day. If you have a wireless device, the transmission will be sent automatically. After your physician reviews your transmission, you will receive a postcard with your next transmission date.  Dr Sallyanne Kuster recommends that you schedule a follow-up appointment in 12 months with a pacemaker check. You will receive a reminder letter in the mail two months in advance. If you don't receive a letter, please call our office to schedule the follow-up appointment.  If you need a refill on your cardiac medications before your next appointment, please call your pharmacy.      Signed, Sanda Klein, MD  12/24/2016 1:51 PM    [facility] [place], [place], [place]  [address] Phone: [phone]; Fax: [phone]

## 2017-01-09 ENCOUNTER — Ambulatory Visit (INDEPENDENT_AMBULATORY_CARE_PROVIDER_SITE_OTHER): Payer: Medicare Other | Admitting: Pharmacist

## 2017-01-09 DIAGNOSIS — Z7901 Long term (current) use of anticoagulants: Secondary | ICD-10-CM | POA: Diagnosis not present

## 2017-01-09 DIAGNOSIS — I4891 Unspecified atrial fibrillation: Secondary | ICD-10-CM | POA: Diagnosis not present

## 2017-01-09 DIAGNOSIS — I482 Chronic atrial fibrillation: Secondary | ICD-10-CM

## 2017-01-09 DIAGNOSIS — I4821 Permanent atrial fibrillation: Secondary | ICD-10-CM

## 2017-01-09 DIAGNOSIS — I25700 Atherosclerosis of coronary artery bypass graft(s), unspecified, with unstable angina pectoris: Secondary | ICD-10-CM

## 2017-01-09 LAB — POCT INR: INR: 2.9

## 2017-01-17 ENCOUNTER — Encounter (HOSPITAL_COMMUNITY): Payer: Self-pay | Admitting: *Deleted

## 2017-01-17 ENCOUNTER — Ambulatory Visit (HOSPITAL_COMMUNITY)
Admission: EM | Admit: 2017-01-17 | Discharge: 2017-01-17 | Disposition: A | Discharge status: Home / Self Care | Payer: Medicare Other | Attending: Family Medicine | Admitting: Family Medicine

## 2017-01-17 DIAGNOSIS — R05 Cough: Secondary | ICD-10-CM

## 2017-01-17 DIAGNOSIS — J40 Bronchitis, not specified as acute or chronic: Secondary | ICD-10-CM | POA: Diagnosis not present

## 2017-01-17 MED ORDER — METHYLPREDNISOLONE ACETATE 80 MG/ML IJ SUSP
80.0000 mg | Freq: Once | INTRAMUSCULAR | Status: AC
  Administered 2017-01-17: 80 mg via INTRAMUSCULAR

## 2017-01-17 MED ORDER — ALBUTEROL SULFATE (2.5 MG/3ML) 0.083% IN NEBU
2.5000 mg | INHALATION_SOLUTION | Freq: Once | RESPIRATORY_TRACT | Status: AC
  Administered 2017-01-17: 2.5 mg via RESPIRATORY_TRACT

## 2017-01-17 MED ORDER — ALBUTEROL SULFATE (2.5 MG/3ML) 0.083% IN NEBU
INHALATION_SOLUTION | RESPIRATORY_TRACT | Status: AC
  Filled 2017-01-17: qty 3

## 2017-01-17 MED ORDER — HYDROCODONE-HOMATROPINE 5-1.5 MG/5ML PO SYRP: 5.0000 mL | ORAL_SOLUTION | Freq: Four times a day (QID) | ORAL | 0 refills | Status: DC | PRN

## 2017-01-17 MED ORDER — METHYLPREDNISOLONE ACETATE 80 MG/ML IJ SUSP
INTRAMUSCULAR | Status: AC
  Filled 2017-01-17: qty 1

## 2017-01-17 NOTE — ED Provider Notes (Signed)
Mike Price    CSN: [id number] Arrival date & time: 01/17/17  1540     History   Chief Complaint Chief Complaint  Patient presents with  . Cough    HPI JUSTUS DROKE is a 82 y.o. male.   Started with congestion and cough 2 days ago.  Yesterday spoke with PCP since cough more productive with color - was called in abx Rx.  Today feeling "sicker".  C/O deep cough, difficulty sleeping, fever today.      Past Medical History:  Diagnosis Date  . Anemia   . Anxiety 01-11-13   tx. Clonazepam.  . Arthritis    Osteoarthritis-knees, fingers  . CAD (coronary artery disease)   . Dysrhythmia 01-11-13    hx. A. Fib-Pacemaker implanted left chest  . Edema extremities 01-11-13   retains fluid in legs-right greater than left.  [name] Kitchen GERD (gastroesophageal reflux disease)   . Hyperlipemia   . Hypertension   . Hypothyroidism   [name] 07/10/2015   IgA 542 mg% 06/19/15. IgA lambda paraprotein on IFE, normal IgG & IgM  . Neuromuscular disorder (HCC)    neuropathy in feet  . Obstructive sleep apnea    on C Pap  . Pacemaker 12/15/06   3'08  . Thyroid disease   . TMJ tenderness 01-11-13   right side-has issues from time to time.    Patient Active Problem List   Diagnosis Date Noted  . NSVT (nonsustained ventricular tachycardia) ([place]) 12/24/2016  . CHB (complete heart block) ([place]) 09/11/2015  . Pacemaker battery depletion 09/11/2015  [name] 07/10/2015  . Pacemaker 07/02/2013  . CAD (coronary artery disease) of artery bypass graft 02/16/2013  . HTN (hypertension) 02/16/2013  . Hyperlipidemia 02/16/2013  . Postoperative anemia due to acute blood loss 01/20/2013  . Postop Hyponatremia 01/18/2013  . OA (osteoarthritis) of knee 01/17/2013  . Permanent atrial fibrillation ([place]) 12/21/2012  . Long term current use of anticoagulant therapy 12/21/2012    Past Surgical History:  Procedure  Laterality Date  . APPENDECTOMY    . CARDIAC CATHETERIZATION  03/28/08   patent grafts, nl EF  . CERVICAL LAMINECTOMY    . CORONARY ARTERY BYPASS GRAFT  2001   4 vessels-'00  . EP IMPLANTABLE DEVICE N/A 09/11/2015   Procedure: PPM Generator Changeout;  Surgeon: Sanda Klein, MD;  Location: [facility];  Service: Cardiovascular;  Laterality: N/A;  . HERNIA REPAIR    . INSERT / REPLACE / REMOVE PACEMAKER     '08-inserted left chest  . JOINT REPLACEMENT     LTHA  . NM MYOCAR PERF WALL MOTION  02/12/2012   small fixed distal anteroapical/apical defect. No reversible ischemia  . PACEMAKER INSERTION  12/15/06   medtronic  . TONSILLECTOMY     child  . TOTAL KNEE ARTHROPLASTY Right 01/17/2013   Procedure: RIGHT TOTAL KNEE ARTHROPLASTY;  Surgeon: Gearlean Alf, MD;  Location: [facility];  Service: Orthopedics;  Laterality: Right;  . US ECHOCARDIOGRAPHY  12/28/2007   LA mod. dilated,RA mildly dilated       Home Medications    Prior to Admission medications   Medication Sig Start Date End Date Taking? Authorizing Provider  acetaminophen (TYLENOL) 500 MG tablet Take 1,000 mg by mouth every 6 (six) hours as needed.   Yes Historical Provider, MD  atorvastatin (LIPITOR) 40 MG tablet Take 40 mg by mouth every evening.    Yes Historical Provider, MD  bisacodyl (DULCOLAX) 10 MG suppository Place 1 suppository (10 mg total) rectally daily as needed. 01/20/13  Yes Alexzandrew L Perkins, PA-C  clonazePAM (KLONOPIN) 0.5 MG tablet Take 0.25-0.5 mg by mouth 3 (three) times daily as needed for anxiety. Takes 1/2 tablet twice daily then a whole tablet at bedtime   Yes Historical Provider, MD  Dextromethorphan-Guaifenesin (TUSSIN DM PO) Take by mouth.   Yes Historical Provider, MD  doxazosin (CARDURA) 2 MG tablet Take 1 mg by mouth at bedtime.   Yes Historical Provider, MD  ezetimibe (ZETIA) 10 MG tablet Take 10 mg by mouth every evening.    Yes Historical Provider, MD  ferrous sulfate 325 (65 FE) MG  tablet Take 325 mg by mouth every other day. On Monday, Wednesday and Friday   Yes Historical Provider, MD  furosemide (LASIX) 20 MG tablet Take 10 mg by mouth daily.    Yes Historical Provider, MD  gabapentin (NEURONTIN) 100 MG capsule Take 1 capsule (100 mg total) by mouth 3 (three) times daily. 09/24/16  Yes Melvenia Beam, MD  levofloxacin (LEVAQUIN) 500 MG tablet Take 500 mg by mouth daily. 01/16/17  Yes Historical Provider, MD  Loratadine (CLARITIN PO) Take by mouth.   Yes Historical Provider, MD  losartan (COZAAR) 100 MG tablet Take 100 mg by mouth daily.   Yes Historical Provider, MD  metoprolol (LOPRESSOR) 100 MG tablet Take 100 mg by mouth 2 (two) times daily.   Yes Historical Provider, MD  NON FORMULARY CPAP qhs   Yes Historical Provider, MD  omeprazole (PRILOSEC) 20 MG capsule Take 20 mg by mouth 2 (two) times daily.   Yes Historical Provider, MD  Skin Protectants, Misc. (EUCERIN) cream Apply 1 application topically daily as needed for dry skin.   Yes Historical Provider, MD  SYNTHROID 88 MCG tablet Take 88 mcg by mouth daily. 05/07/15  Yes Historical Provider, MD  traMADol (ULTRAM) 50 MG tablet Take 50 mg by mouth as needed.   Yes Historical Provider, MD  warfarin (COUMADIN) 5 MG tablet Take 1 to 1.5 tablets by mouth daily as directed by coumadin clinic 12/23/16  Yes Lorretta Harp, MD  HYDROcodone-homatropine (HYDROMET) 5-1.5 MG/5ML syrup Take 5 mLs by mouth every 6 (six) hours as needed for cough. 01/17/17   Robyn Haber, MD    Family History Family History  Problem Relation Age of Onset  . Stroke      Grandfather   . Neuropathy Neg Hx     Social History Social History  Substance Use Topics  . Smoking status: Former Smoker    Quit date: 01/12/1984  . Smokeless tobacco: Never Used  . Alcohol use 1.8 oz/week    3 Shots of liquor per week     Comment:  3 ounces daily     Allergies   Lisinopril and Mefloquine   Review of Systems Review of Systems  Constitutional:  Negative.  Negative for fever.  HENT: Negative.   Respiratory: Positive for cough and wheezing.   Gastrointestinal: Negative.   Neurological: Negative.      Physical Exam Triage Vital Signs ED Triage Vitals [01/17/17 1603]  Enc Vitals Group     BP (!) 170/88     Pulse Rate 79     Resp (!) 22     Temp 99.3 F (37.4 C)     Temp Source Temporal     SpO2 94 %     Weight      Height  Head Circumference      Peak Flow      Pain Score      Pain Loc      Pain Edu?      Excl. in [place]?    No data found.   Updated Vital Signs BP (!) 170/88   Pulse 79   Temp 99.3 F (37.4 C) (Temporal)   Resp (!) 22   SpO2 94%    Physical Exam  Constitutional: He is oriented to person, place, and time. He appears well-developed and well-nourished.  HENT:  Right Ear: External ear normal.  Left Ear: External ear normal.  Mouth/Throat: Oropharynx is clear and moist.  Eyes: Conjunctivae and EOM are normal. Pupils are equal, round, and reactive to light.  Neck: Normal range of motion. Neck supple.  Pulmonary/Chest: He has wheezes. He has rales.  Musculoskeletal: Normal range of motion.  Neurological: He is alert and oriented to person, place, and time.  Skin: Skin is warm and dry.  Nursing note and vitals reviewed.    UC Treatments / Results  Labs (all labs ordered are listed, but only abnormal results are displayed) Labs Reviewed - No data to display  EKG  EKG Interpretation None       Radiology No results found.  Procedures Procedures (including critical care time)  Medications Ordered in UC Medications  methylPREDNISolone acetate (DEPO-MEDROL) injection 80 mg (not administered)  albuterol (PROVENTIL) (2.5 MG/3ML) 0.083% nebulizer solution 2.5 mg (2.5 mg Nebulization Given 01/17/17 1635)     Initial Impression / Assessment and Plan / UC Course  I have reviewed the triage vital signs and the nursing notes.  Pertinent labs & imaging results that were available  during my care of the patient were reviewed by me and considered in my medical decision making (see chart for details).     Final Clinical Impressions(s) / UC Diagnoses   Final diagnoses:  Bronchitis    New Prescriptions New Prescriptions   HYDROCODONE-HOMATROPINE (HYDROMET) 5-1.5 MG/5ML SYRUP    Take 5 mLs by mouth every 6 (six) hours as needed for cough.     Robyn Haber, MD 01/17/17 [phone]

## 2017-01-17 NOTE — Discharge Instructions (Signed)
Please come back if you're bronchitis is worsening over the next 1-2 days.

## 2017-01-17 NOTE — ED Triage Notes (Signed)
Started with congestion and cough 2 days ago.  Yesterday spoke with PCP since cough more productive with color - was called in abx Rx.  Today feeling "sicker".  C/O deep cough, difficulty sleeping, fever today.

## 2017-01-19 ENCOUNTER — Telehealth: Payer: Self-pay | Admitting: Pharmacist Clinician (PhC)/ Clinical Pharmacy Specialist

## 2017-01-19 NOTE — Telephone Encounter (Signed)
Patient called to let us know that he was prescribed levofloxacin 500 mg daily for 7 days.  Started Friday.  Was given hydrocodone/homatropine cough syrup on Saturday.    Last INR was 2.9. Advised to hold warfarin x 1 dose then resume normal schedule. Repeat INR as scheduled

## 2017-01-21 ENCOUNTER — Ambulatory Visit
Admission: RE | Admit: 2017-01-21 | Discharge: 2017-01-21 | Disposition: A | Discharge status: Home / Self Care | Payer: Medicare Other | Source: Ambulatory Visit | Attending: Cardiovascular Disease | Admitting: Cardiovascular Disease

## 2017-01-21 DIAGNOSIS — R042 Hemoptysis: Secondary | ICD-10-CM

## 2017-01-22 ENCOUNTER — Ambulatory Visit (HOSPITAL_COMMUNITY)
Admission: EM | Admit: 2017-01-22 | Discharge: 2017-01-22 | Disposition: A | Discharge status: Home / Self Care | Payer: Medicare Other | Attending: Family Medicine | Admitting: Family Medicine

## 2017-01-22 ENCOUNTER — Encounter (HOSPITAL_COMMUNITY): Payer: Self-pay | Admitting: Emergency Medicine

## 2017-01-22 DIAGNOSIS — I5020 Unspecified systolic (congestive) heart failure: Secondary | ICD-10-CM

## 2017-01-22 DIAGNOSIS — J40 Bronchitis, not specified as acute or chronic: Secondary | ICD-10-CM

## 2017-01-22 MED ORDER — AMOXICILLIN-POT CLAVULANATE 875-125 MG PO TABS: 1.0000 | ORAL_TABLET | Freq: Two times a day (BID) | ORAL | 0 refills | Status: DC

## 2017-01-22 NOTE — ED Triage Notes (Addendum)
Here for persistent prod cough and nasal congestion associated w/wheezing and SOB  Seen here on 4/14 for similar sx  Sts he finished his Levaquin w/no relief.   A&O x4... NAD

## 2017-01-22 NOTE — Discharge Instructions (Signed)
You need to take your Lasix today and then take a double dose Friday, Saturday, and Sunday. Please let me know if you're not doing better on Saturday or Sunday.

## 2017-01-22 NOTE — ED Provider Notes (Signed)
[place]    CSN: [id number] Arrival date & time: 01/22/17  1610     History   Chief Complaint Chief Complaint  Patient presents with  . URI    HPI Mike Price is a 82 y.o. male.   This is an 82 year old man who was initially treated over the phone with Levaquin 5 days ago. He was seen here a day or 2 after he started his medicine to make sure that he did not have pneumonia.  He was seen by his cardiologist yesterday and an x-ray revealed that he has had significant clearing since pneumonia      Past Medical History:  Diagnosis Date  . Anemia   . Anxiety 01-11-13   tx. Clonazepam.  . Arthritis    Osteoarthritis-knees, fingers  . CAD (coronary artery disease)   . Dysrhythmia 01-11-13    hx. A. Fib-Pacemaker implanted left chest  . Edema extremities 01-11-13   retains fluid in legs-right greater than left.  [name] Kitchen GERD (gastroesophageal reflux disease)   . Hyperlipemia   . Hypertension   . Hypothyroidism   [name] 07/10/2015   IgA 542 mg% 06/19/15. IgA lambda paraprotein on IFE, normal IgG & IgM  . Neuromuscular disorder (HCC)    neuropathy in feet  . Obstructive sleep apnea    on C Pap  . Pacemaker 12/15/06   3'08  . Thyroid disease   . TMJ tenderness 01-11-13   right side-has issues from time to time.    Patient Active Problem List   Diagnosis Date Noted  . NSVT (nonsustained ventricular tachycardia) ([place]) 12/24/2016  . CHB (complete heart block) ([place]) 09/11/2015  . Pacemaker battery depletion 09/11/2015  [name] 07/10/2015  . Pacemaker 07/02/2013  . CAD (coronary artery disease) of artery bypass graft 02/16/2013  . HTN (hypertension) 02/16/2013  . Hyperlipidemia 02/16/2013  . Postoperative anemia due to acute blood loss 01/20/2013  . Postop Hyponatremia 01/18/2013  . OA (osteoarthritis) of knee 01/17/2013  . Permanent atrial fibrillation ([place]) 12/21/2012   . Long term current use of anticoagulant therapy 12/21/2012    Past Surgical History:  Procedure Laterality Date  . APPENDECTOMY    . CARDIAC CATHETERIZATION  03/28/08   patent grafts, nl EF  . CERVICAL LAMINECTOMY    . CORONARY ARTERY BYPASS GRAFT  2001   4 vessels-'00  . EP IMPLANTABLE DEVICE N/A 09/11/2015   Procedure: PPM Generator Changeout;  Surgeon: Sanda Klein, MD;  Location: [facility];  Service: Cardiovascular;  Laterality: N/A;  . HERNIA REPAIR    . INSERT / REPLACE / REMOVE PACEMAKER     '08-inserted left chest  . JOINT REPLACEMENT     LTHA  . NM MYOCAR PERF WALL MOTION  02/12/2012   small fixed distal anteroapical/apical defect. No reversible ischemia  . PACEMAKER INSERTION  12/15/06   medtronic  . TONSILLECTOMY     child  . TOTAL KNEE ARTHROPLASTY Right 01/17/2013   Procedure: RIGHT TOTAL KNEE ARTHROPLASTY;  Surgeon: Gearlean Alf, MD;  Location: [facility];  Service: Orthopedics;  Laterality: Right;  . US ECHOCARDIOGRAPHY  12/28/2007   LA mod. dilated,RA mildly dilated       Home Medications    Prior to Admission medications   Medication Sig Start Date End Date Taking? Authorizing Provider  atorvastatin (LIPITOR) 40 MG tablet Take 40 mg by mouth every evening.    Yes Historical Provider, MD  bisacodyl (DULCOLAX) 10 MG suppository Place 1 suppository (10 mg total) rectally daily as needed. 01/20/13  Yes Alexzandrew L Perkins, PA-C  clonazePAM (KLONOPIN) 0.5 MG tablet Take 0.25-0.5 mg by mouth 3 (three) times daily as needed for anxiety. Takes 1/2 tablet twice daily then a whole tablet at bedtime   Yes Historical Provider, MD  doxazosin (CARDURA) 2 MG tablet Take 1 mg by mouth at bedtime.   Yes Historical Provider, MD  ezetimibe (ZETIA) 10 MG tablet Take 10 mg by mouth every evening.    Yes Historical Provider, MD  ferrous sulfate 325 (65 FE) MG tablet Take 325 mg by mouth every other day. On Monday, Wednesday and Friday   Yes Historical Provider, MD    furosemide (LASIX) 20 MG tablet Take 10 mg by mouth daily.    Yes Historical Provider, MD  gabapentin (NEURONTIN) 100 MG capsule Take 1 capsule (100 mg total) by mouth 3 (three) times daily. 09/24/16  Yes Melvenia Beam, MD  HYDROcodone-homatropine (HYDROMET) 5-1.5 MG/5ML syrup Take 5 mLs by mouth every 6 (six) hours as needed for cough. 01/17/17  Yes Robyn Haber, MD  levofloxacin (LEVAQUIN) 500 MG tablet Take 500 mg by mouth daily. 01/16/17  Yes Historical Provider, MD  Loratadine (CLARITIN PO) Take by mouth.   Yes Historical Provider, MD  losartan (COZAAR) 100 MG tablet Take 100 mg by mouth daily.   Yes Historical Provider, MD  metoprolol (LOPRESSOR) 100 MG tablet Take 100 mg by mouth 2 (two) times daily.   Yes Historical Provider, MD  omeprazole (PRILOSEC) 20 MG capsule Take 20 mg by mouth 2 (two) times daily.   Yes Historical Provider, MD  SYNTHROID 88 MCG tablet Take 88 mcg by mouth daily. 05/07/15  Yes Historical Provider, MD  traMADol (ULTRAM) 50 MG tablet Take 50 mg by mouth as needed.   Yes Historical Provider, MD  warfarin (COUMADIN) 5 MG tablet Take 1 to 1.5 tablets by mouth daily as directed by coumadin clinic 12/23/16  Yes Lorretta Harp, MD  acetaminophen (TYLENOL) 500 MG tablet Take 1,000 mg by mouth every 6 (six) hours as needed.    Historical Provider, MD  amoxicillin-clavulanate (AUGMENTIN) 875-125 MG tablet Take 1 tablet by mouth every 12 (twelve) hours. 01/22/17   Robyn Haber, MD  Dextromethorphan-Guaifenesin (TUSSIN DM PO) Take by mouth.    Historical Provider, MD  NON FORMULARY CPAP qhs    Historical Provider, MD  Skin Protectants, Misc. (EUCERIN) cream Apply 1 application topically daily as needed for dry skin.    Historical Provider, MD    Family History Family History  Problem Relation Age of Onset  . Stroke      Grandfather   . Neuropathy Neg Hx     Social History Social History  Substance Use Topics  . Smoking status: Former Smoker    Quit date:  01/12/1984  . Smokeless tobacco: Never Used  . Alcohol use 1.8 oz/week    3 Shots of liquor per week     Comment:  3 ounces daily     Allergies   Lisinopril and Mefloquine   Review of Systems Review of Systems  Constitutional: Positive for fatigue.  Respiratory: Positive for cough, shortness of breath and wheezing.   Cardiovascular: Positive for leg swelling.  All other systems reviewed and are negative.    Physical Exam Triage Vital Signs ED Triage Vitals  Enc Vitals Group     BP      Pulse  Resp      Temp      Temp src      SpO2      Weight      Height      Head Circumference      Peak Flow      Pain Score      Pain Loc      Pain Edu?      Excl. in [place]?    No data found.   Updated Vital Signs BP 132/79 (BP Location: Left Arm)   Pulse 72   Temp 97.8 F (36.6 C) (Oral)   Resp 20   SpO2 91%    Physical Exam  Constitutional: He appears well-developed and well-nourished.  HENT:  Right Ear: External ear normal.  Left Ear: External ear normal.  Mouth/Throat: Oropharynx is clear and moist.  Eyes: Conjunctivae are normal. Pupils are equal, round, and reactive to light.  Neck: Normal range of motion. Neck supple.  Cardiovascular: Normal rate.   Pulmonary/Chest: He has wheezes. He has rales.  Musculoskeletal: Normal range of motion. He exhibits edema.  Neurological: He is alert.  Skin: Skin is warm and dry.  Violaceous and indurated lower extremities bilaterally  Nursing note and vitals reviewed.    UC Treatments / Results  Labs (all labs ordered are listed, but only abnormal results are displayed) Labs Reviewed - No data to display  EKG  EKG Interpretation None       Radiology Dg Chest 2 View  Addendum Date: 01/22/2017   ADDENDUM REPORT: 01/22/2017 11:16 ADDENDUM: Ordering provider should be [name]. Electronically Signed   By: Genevie Ann M.D.   On: 01/22/2017 11:16   Result Date: 01/22/2017 CLINICAL DATA:  82 year old male with  pneumonia in February. Continued cough. Subsequent encounter. EXAM: CHEST  2 VIEW COMPARISON:  11/10/2016 and earlier. FINDINGS: Stable cardiomegaly and mediastinal contours. Prior CABG. Stable left chest stool lead cardiac pacemaker. Stable lung volumes. Regressed right lower lobe pulmonary opacity since 11/10/2016. Mild residual at the right cardiophrenic angle. No pleural effusion. No pneumothorax. Mild pulmonary vascular congestion but no overt edema. No acute osseous abnormality identified. Paucity of bowel gas in the upper abdomen. IMPRESSION: 1. Regressed but not completely resolved right lower lobe opacity since February. This could reflect residual or recurrent infection at the medial right lung base. No pleural effusion. 2. Consider additional treatment with follow-up PA and lateral chest X-ray is recommended in 3-4 more weeks. Electronically Signed: By: Genevie Ann M.D. On: 01/21/2017 16:26    Procedures Procedures (including critical care time)  Medications Ordered in UC Medications - No data to display   Initial Impression / Assessment and Plan / UC Course  I have reviewed the triage vital signs and the nursing notes.  Pertinent labs & imaging results that were available during my care of the patient were reviewed by me and considered in my medical decision making (see chart for details).     Final Clinical Impressions(s) / UC Diagnoses   Final diagnoses:  Bronchitis  Systolic congestive heart failure, unspecified HF chronicity (HCC)    New Prescriptions New Prescriptions   AMOXICILLIN-CLAVULANATE (AUGMENTIN) 875-125 MG TABLET    Take 1 tablet by mouth every 12 (twelve) hours.     Robyn Haber, MD 01/22/17 [phone]

## 2017-01-26 ENCOUNTER — Ambulatory Visit
Admission: RE | Admit: 2017-01-26 | Discharge: 2017-01-26 | Disposition: A | Discharge status: Home / Self Care | Payer: Medicare Other | Source: Ambulatory Visit | Attending: Internal Medicine | Admitting: Internal Medicine

## 2017-01-26 ENCOUNTER — Other Ambulatory Visit: Payer: Self-pay | Admitting: Internal Medicine

## 2017-01-26 DIAGNOSIS — R05 Cough: Secondary | ICD-10-CM

## 2017-01-26 DIAGNOSIS — R059 Cough, unspecified: Secondary | ICD-10-CM

## 2017-02-06 ENCOUNTER — Ambulatory Visit (INDEPENDENT_AMBULATORY_CARE_PROVIDER_SITE_OTHER): Payer: Medicare Other | Admitting: Pharmacist Clinician (PhC)/ Clinical Pharmacy Specialist

## 2017-02-06 DIAGNOSIS — I482 Chronic atrial fibrillation: Secondary | ICD-10-CM | POA: Diagnosis not present

## 2017-02-06 DIAGNOSIS — Z7901 Long term (current) use of anticoagulants: Secondary | ICD-10-CM

## 2017-02-06 DIAGNOSIS — I4891 Unspecified atrial fibrillation: Secondary | ICD-10-CM

## 2017-02-06 DIAGNOSIS — I4821 Permanent atrial fibrillation: Secondary | ICD-10-CM

## 2017-02-06 DIAGNOSIS — I25700 Atherosclerosis of coronary artery bypass graft(s), unspecified, with unstable angina pectoris: Secondary | ICD-10-CM

## 2017-02-06 LAB — POCT INR: INR: 2.7

## 2017-02-27 IMAGING — DX DG KNEE COMPLETE 4+V*L*
4 series · 4 of 4 positions shown · non-contrast
Comparison: None.

CLINICAL DATA: Pain following fall

EXAM:
LEFT KNEE - COMPLETE 4+ VIEW

[knee ap]
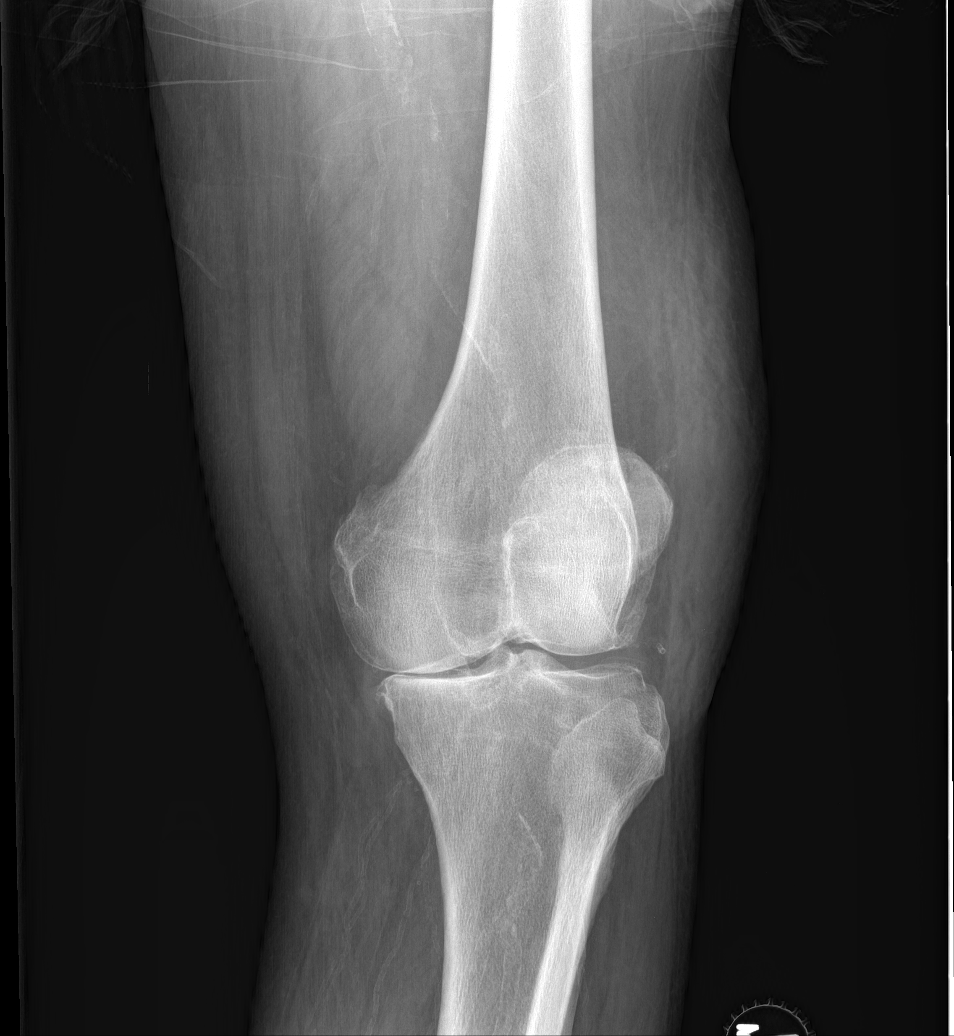

[knee obl (1 of 2)]
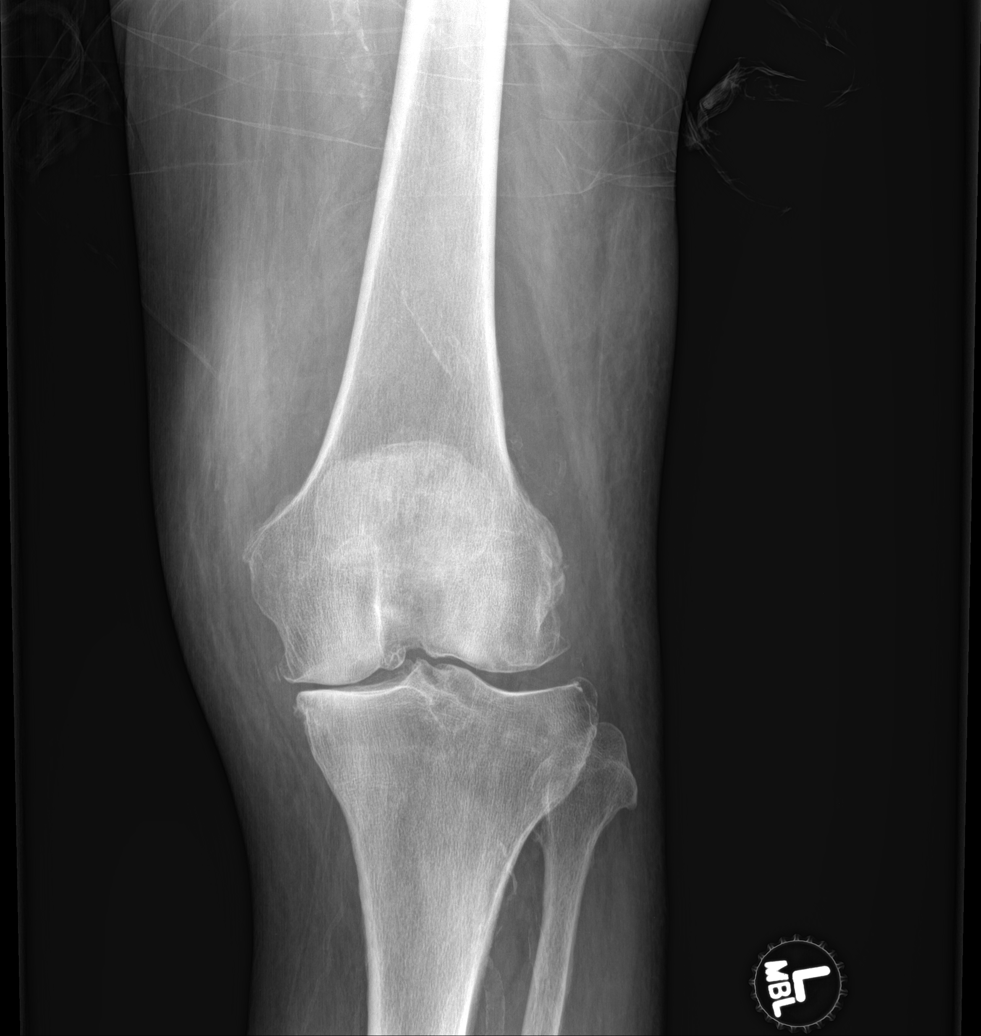

[knee obl (2 of 2)]
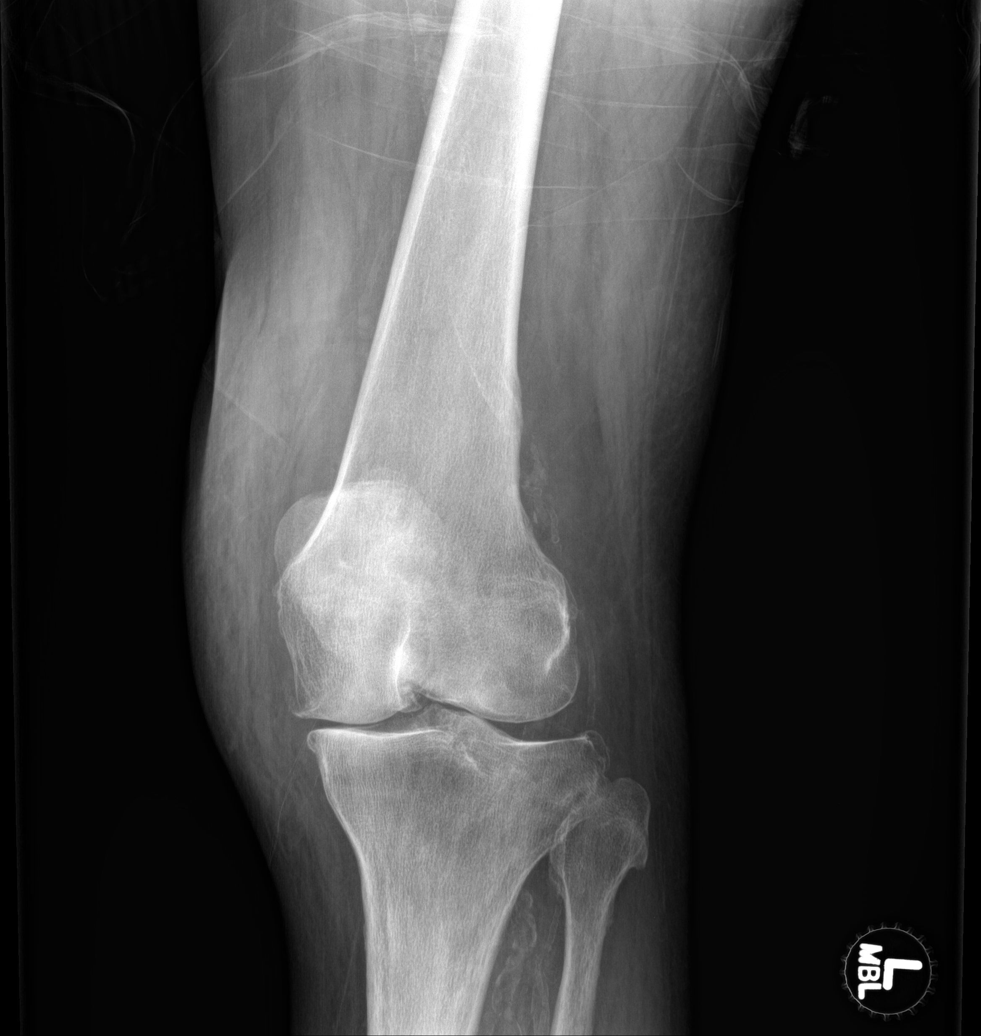

[knee lat]
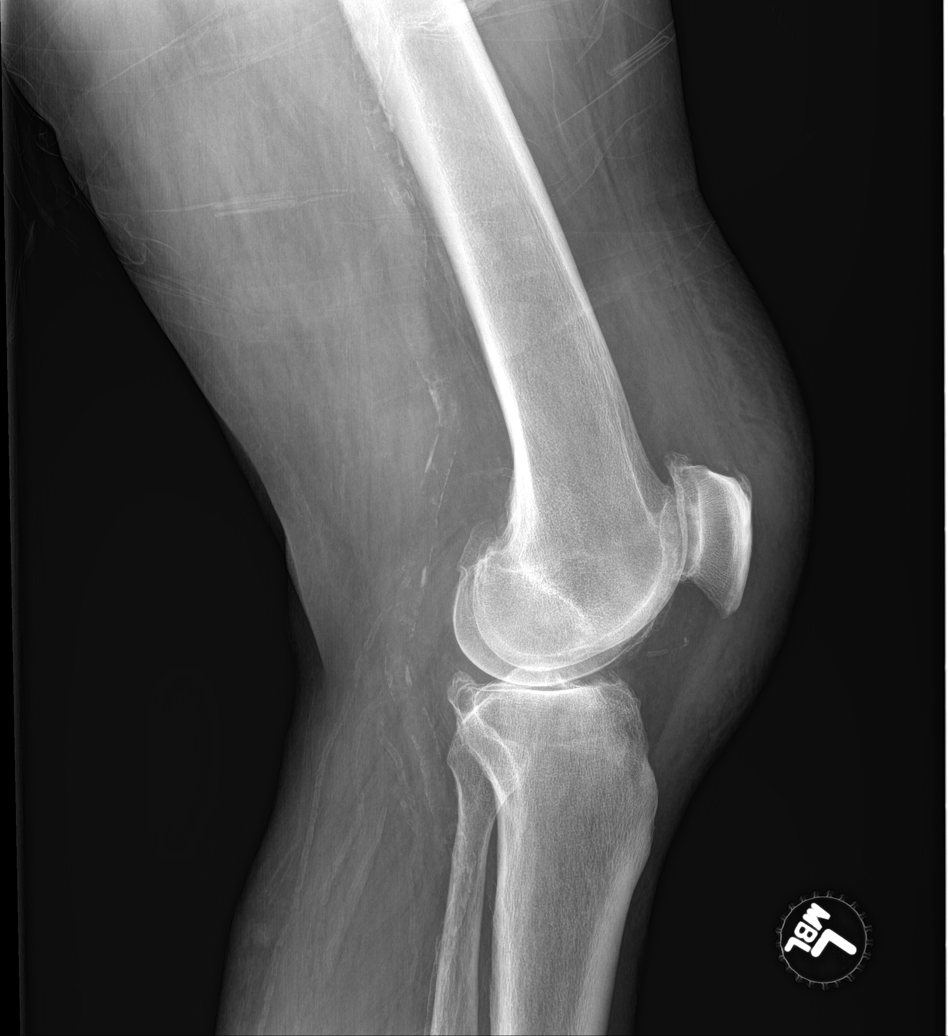

[4 of 4 positions shown; findings below may reference images not displayed]

FINDINGS: Frontal, lateral, and bilateral oblique views obtained. There is
extensive prepatellar soft tissue edema. There is a small joint
effusion. There is no fracture or dislocation. There is fairly
marked narrowing medially. There is spurring in all compartments,
largest along the posterior patella. There is extensive
atherosclerotic vascular calcification.
IMPRESSION: No fracture or dislocation. Prepatellar soft tissue edema. There may
well be prepatellar hemorrhage. Small joint effusion. Osteoarthritic
change with narrowing most marked medially. Atherosclerotic vascular
calcification.

## 2017-03-06 ENCOUNTER — Ambulatory Visit (INDEPENDENT_AMBULATORY_CARE_PROVIDER_SITE_OTHER): Payer: Medicare Other | Admitting: Pharmacist Clinician (PhC)/ Clinical Pharmacy Specialist

## 2017-03-06 DIAGNOSIS — I482 Chronic atrial fibrillation: Secondary | ICD-10-CM | POA: Diagnosis not present

## 2017-03-06 DIAGNOSIS — I4891 Unspecified atrial fibrillation: Secondary | ICD-10-CM | POA: Diagnosis not present

## 2017-03-06 DIAGNOSIS — Z7901 Long term (current) use of anticoagulants: Secondary | ICD-10-CM

## 2017-03-06 DIAGNOSIS — I25700 Atherosclerosis of coronary artery bypass graft(s), unspecified, with unstable angina pectoris: Secondary | ICD-10-CM

## 2017-03-06 DIAGNOSIS — I4821 Permanent atrial fibrillation: Secondary | ICD-10-CM

## 2017-03-06 LAB — POCT INR: INR: 3.5

## 2017-03-19 ENCOUNTER — Telehealth: Payer: Self-pay | Admitting: *Deleted

## 2017-03-19 NOTE — Telephone Encounter (Signed)
Requesting surgical clearance:  1. Type of surgery: Right Wrist: right carpal tunnel release  2. Surgeon: Dr. Iran Planas  3.Surgical Date: TBD  4. Medications that need to be held: Coumadin   5. CAD: Yes  6. I will defer to:  Dr. Gwenlyn Found and Dr. Sherril Croon Information: [facility]  Fax #: [phone] Phone # [phone]  Requesting clearance from Dr. Gwenlyn Found and Dr. Elder Negus requesting instructions regarding coumadin/lovenox bridge

## 2017-03-19 NOTE — Telephone Encounter (Signed)
Pt on warfarin for Afib with CHADS<4 (age, HTN) without history of stroke/TIA. Per protocol ok to hold 5 days prior to procedure. Resume evening of procedure or as directed by MD.

## 2017-03-25 ENCOUNTER — Ambulatory Visit (INDEPENDENT_AMBULATORY_CARE_PROVIDER_SITE_OTHER): Payer: Medicare Other | Admitting: *Deleted

## 2017-03-25 DIAGNOSIS — I442 Atrioventricular block, complete: Secondary | ICD-10-CM | POA: Diagnosis not present

## 2017-03-26 NOTE — Progress Notes (Signed)
Remote pacemaker transmission.   

## 2017-03-27 ENCOUNTER — Ambulatory Visit (INDEPENDENT_AMBULATORY_CARE_PROVIDER_SITE_OTHER): Payer: Medicare Other | Admitting: Pharmacist Clinician (PhC)/ Clinical Pharmacy Specialist

## 2017-03-27 ENCOUNTER — Telehealth: Payer: Self-pay | Admitting: Cardiovascular Disease

## 2017-03-27 ENCOUNTER — Encounter: Payer: Self-pay | Admitting: Cardiology

## 2017-03-27 DIAGNOSIS — Z7901 Long term (current) use of anticoagulants: Secondary | ICD-10-CM

## 2017-03-27 DIAGNOSIS — I4891 Unspecified atrial fibrillation: Secondary | ICD-10-CM | POA: Diagnosis not present

## 2017-03-27 DIAGNOSIS — I482 Chronic atrial fibrillation: Secondary | ICD-10-CM

## 2017-03-27 DIAGNOSIS — I25700 Atherosclerosis of coronary artery bypass graft(s), unspecified, with unstable angina pectoris: Secondary | ICD-10-CM | POA: Diagnosis not present

## 2017-03-27 DIAGNOSIS — I4821 Permanent atrial fibrillation: Secondary | ICD-10-CM

## 2017-03-27 LAB — POCT INR: INR: 3.2

## 2017-03-31 ENCOUNTER — Telehealth: Payer: Self-pay | Admitting: Pharmacist Clinician (PhC)/ Clinical Pharmacy Specialist

## 2017-03-31 NOTE — Telephone Encounter (Signed)
Patient Mike Price that he missed last Thursday night medications.  This am took his morning pills, but then got distracted, saw last Thursday pills and thought they were for today and took them as well.  Advised he hold metoprolol, omeprazole and atorvastatin dose for tonight, then resume normal med schedule tomorrow.

## 2017-04-01 LAB — CUP PACEART REMOTE DEVICE CHECK
Battery Impedance: 136 Ohm
Battery Remaining Longevity: 154 mo
Battery Voltage: 2.79 V
Brady Statistic RV Percent Paced: 99 %
Implantable Lead Implant Date: 20080311
Implantable Lead Location: 753860
Implantable Lead Model: 5076
Implantable Lead Model: 5076
Lead Channel Impedance Value: 720 Ohm
Lead Channel Pacing Threshold Amplitude: 1.125 V
Lead Channel Setting Pacing Amplitude: 2.25 V
Lead Channel Setting Pacing Pulse Width: 0.4 ms
Lead Channel Setting Sensing Sensitivity: 2.8 mV
MDC IDC LEAD IMPLANT DT: 20080311
MDC IDC LEAD LOCATION: 753859
MDC IDC MSMT LEADCHNL RA IMPEDANCE VALUE: 67 Ohm
MDC IDC MSMT LEADCHNL RV PACING THRESHOLD PULSEWIDTH: 0.4 ms
MDC IDC PG IMPLANT DT: 20161206
MDC IDC SESS DTM: 20180620123847

## 2017-04-03 NOTE — Telephone Encounter (Signed)
error 

## 2017-04-09 NOTE — Telephone Encounter (Signed)
Clearance routed to number provided via EPIC. 

## 2017-04-10 ENCOUNTER — Encounter: Payer: Self-pay | Admitting: Cardiology

## 2017-04-10 ENCOUNTER — Ambulatory Visit (INDEPENDENT_AMBULATORY_CARE_PROVIDER_SITE_OTHER): Payer: Medicare Other | Admitting: Pharmacist

## 2017-04-10 DIAGNOSIS — I4891 Unspecified atrial fibrillation: Secondary | ICD-10-CM | POA: Diagnosis not present

## 2017-04-10 DIAGNOSIS — I4821 Permanent atrial fibrillation: Secondary | ICD-10-CM

## 2017-04-10 DIAGNOSIS — I482 Chronic atrial fibrillation: Secondary | ICD-10-CM

## 2017-04-10 DIAGNOSIS — Z7901 Long term (current) use of anticoagulants: Secondary | ICD-10-CM | POA: Diagnosis not present

## 2017-04-10 DIAGNOSIS — I25700 Atherosclerosis of coronary artery bypass graft(s), unspecified, with unstable angina pectoris: Secondary | ICD-10-CM | POA: Diagnosis not present

## 2017-04-10 LAB — POCT INR: INR: 4

## 2017-04-14 ENCOUNTER — Telehealth: Payer: Self-pay | Admitting: Pharmacist

## 2017-04-14 NOTE — Telephone Encounter (Signed)
Diverticulitis flare - wanting to know if affects Coumadin - no new meds.

## 2017-04-20 ENCOUNTER — Ambulatory Visit (INDEPENDENT_AMBULATORY_CARE_PROVIDER_SITE_OTHER): Payer: Medicare Other | Admitting: Pharmacist Clinician (PhC)/ Clinical Pharmacy Specialist

## 2017-04-20 DIAGNOSIS — I482 Chronic atrial fibrillation: Secondary | ICD-10-CM

## 2017-04-20 DIAGNOSIS — Z7901 Long term (current) use of anticoagulants: Secondary | ICD-10-CM | POA: Diagnosis not present

## 2017-04-20 DIAGNOSIS — I25700 Atherosclerosis of coronary artery bypass graft(s), unspecified, with unstable angina pectoris: Secondary | ICD-10-CM | POA: Diagnosis not present

## 2017-04-20 DIAGNOSIS — I4891 Unspecified atrial fibrillation: Secondary | ICD-10-CM | POA: Diagnosis not present

## 2017-04-20 DIAGNOSIS — I4821 Permanent atrial fibrillation: Secondary | ICD-10-CM

## 2017-04-20 LAB — POCT INR: INR: 1.8

## 2017-04-22 ENCOUNTER — Encounter: Payer: Self-pay | Admitting: Oncology

## 2017-05-04 ENCOUNTER — Ambulatory Visit (INDEPENDENT_AMBULATORY_CARE_PROVIDER_SITE_OTHER): Payer: Medicare Other | Admitting: Pharmacist

## 2017-05-04 DIAGNOSIS — I25700 Atherosclerosis of coronary artery bypass graft(s), unspecified, with unstable angina pectoris: Secondary | ICD-10-CM

## 2017-05-04 DIAGNOSIS — I4891 Unspecified atrial fibrillation: Secondary | ICD-10-CM

## 2017-05-04 DIAGNOSIS — I4821 Permanent atrial fibrillation: Secondary | ICD-10-CM

## 2017-05-04 DIAGNOSIS — Z7901 Long term (current) use of anticoagulants: Secondary | ICD-10-CM | POA: Diagnosis not present

## 2017-05-04 DIAGNOSIS — I482 Chronic atrial fibrillation: Secondary | ICD-10-CM | POA: Diagnosis not present

## 2017-05-04 LAB — POCT INR: INR: 3.5

## 2017-05-29 ENCOUNTER — Ambulatory Visit (INDEPENDENT_AMBULATORY_CARE_PROVIDER_SITE_OTHER): Payer: Medicare Other | Admitting: Pharmacist Clinician (PhC)/ Clinical Pharmacy Specialist

## 2017-05-29 DIAGNOSIS — I25700 Atherosclerosis of coronary artery bypass graft(s), unspecified, with unstable angina pectoris: Secondary | ICD-10-CM

## 2017-05-29 DIAGNOSIS — I482 Chronic atrial fibrillation: Secondary | ICD-10-CM

## 2017-05-29 DIAGNOSIS — Z7901 Long term (current) use of anticoagulants: Secondary | ICD-10-CM

## 2017-05-29 DIAGNOSIS — I4891 Unspecified atrial fibrillation: Secondary | ICD-10-CM

## 2017-05-29 DIAGNOSIS — I4821 Permanent atrial fibrillation: Secondary | ICD-10-CM

## 2017-05-29 LAB — POCT INR: INR: 1.7

## 2017-06-17 ENCOUNTER — Ambulatory Visit (INDEPENDENT_AMBULATORY_CARE_PROVIDER_SITE_OTHER): Payer: Medicare Other | Admitting: Pharmacist Clinician (PhC)/ Clinical Pharmacy Specialist

## 2017-06-17 DIAGNOSIS — Z7901 Long term (current) use of anticoagulants: Secondary | ICD-10-CM

## 2017-06-17 DIAGNOSIS — I482 Chronic atrial fibrillation: Secondary | ICD-10-CM

## 2017-06-17 DIAGNOSIS — I4891 Unspecified atrial fibrillation: Secondary | ICD-10-CM | POA: Diagnosis not present

## 2017-06-17 DIAGNOSIS — I4821 Permanent atrial fibrillation: Secondary | ICD-10-CM

## 2017-06-17 LAB — POCT INR: INR: 3.7

## 2017-06-24 ENCOUNTER — Ambulatory Visit (INDEPENDENT_AMBULATORY_CARE_PROVIDER_SITE_OTHER): Payer: Medicare Other | Admitting: *Deleted

## 2017-06-24 DIAGNOSIS — I442 Atrioventricular block, complete: Secondary | ICD-10-CM | POA: Diagnosis not present

## 2017-06-24 NOTE — Progress Notes (Signed)
Remote pacemaker transmission.   

## 2017-06-25 LAB — CUP PACEART REMOTE DEVICE CHECK
Battery Impedance: 136 Ohm
Implantable Lead Implant Date: 20080311
Implantable Lead Location: 753859
Implantable Lead Model: 5076
Implantable Pulse Generator Implant Date: 20161206
Lead Channel Impedance Value: 666 Ohm
Lead Channel Impedance Value: 67 Ohm
Lead Channel Pacing Threshold Pulse Width: 0.4 ms
MDC IDC LEAD IMPLANT DT: 20080311
MDC IDC LEAD LOCATION: 753860
MDC IDC MSMT BATTERY REMAINING LONGEVITY: 155 mo
MDC IDC MSMT BATTERY VOLTAGE: 2.79 V
MDC IDC MSMT LEADCHNL RV PACING THRESHOLD AMPLITUDE: 1 V
MDC IDC SESS DTM: 20180919123854
MDC IDC SET LEADCHNL RV PACING AMPLITUDE: 2 V
MDC IDC SET LEADCHNL RV PACING PULSEWIDTH: 0.4 ms
MDC IDC SET LEADCHNL RV SENSING SENSITIVITY: 2.8 mV
MDC IDC STAT BRADY RV PERCENT PACED: 99 %

## 2017-06-26 ENCOUNTER — Encounter: Payer: Self-pay | Admitting: Cardiology

## 2017-07-08 ENCOUNTER — Ambulatory Visit (INDEPENDENT_AMBULATORY_CARE_PROVIDER_SITE_OTHER): Payer: Medicare Other | Admitting: Pharmacist

## 2017-07-08 DIAGNOSIS — I482 Chronic atrial fibrillation: Secondary | ICD-10-CM

## 2017-07-08 DIAGNOSIS — I4821 Permanent atrial fibrillation: Secondary | ICD-10-CM

## 2017-07-08 DIAGNOSIS — I4891 Unspecified atrial fibrillation: Secondary | ICD-10-CM | POA: Diagnosis not present

## 2017-07-08 DIAGNOSIS — Z7901 Long term (current) use of anticoagulants: Secondary | ICD-10-CM

## 2017-07-08 LAB — POCT INR: INR: 2.9

## 2017-08-03 ENCOUNTER — Ambulatory Visit (INDEPENDENT_AMBULATORY_CARE_PROVIDER_SITE_OTHER): Payer: Medicare Other | Admitting: Pharmacist

## 2017-08-03 DIAGNOSIS — I4891 Unspecified atrial fibrillation: Secondary | ICD-10-CM

## 2017-08-03 DIAGNOSIS — I4821 Permanent atrial fibrillation: Secondary | ICD-10-CM

## 2017-08-03 DIAGNOSIS — Z7901 Long term (current) use of anticoagulants: Secondary | ICD-10-CM | POA: Diagnosis not present

## 2017-08-03 DIAGNOSIS — I482 Chronic atrial fibrillation: Secondary | ICD-10-CM | POA: Diagnosis not present

## 2017-08-03 LAB — POCT INR: INR: 1.8

## 2017-08-04 ENCOUNTER — Other Ambulatory Visit: Payer: Self-pay | Admitting: Internal Medicine

## 2017-08-04 ENCOUNTER — Ambulatory Visit
Admission: RE | Admit: 2017-08-04 | Discharge: 2017-08-04 | Disposition: A | Discharge status: Home / Self Care | Payer: Medicare Other | Source: Ambulatory Visit | Attending: Internal Medicine | Admitting: Internal Medicine

## 2017-08-04 DIAGNOSIS — R05 Cough: Secondary | ICD-10-CM

## 2017-08-04 DIAGNOSIS — R059 Cough, unspecified: Secondary | ICD-10-CM

## 2017-08-12 ENCOUNTER — Other Ambulatory Visit (INDEPENDENT_AMBULATORY_CARE_PROVIDER_SITE_OTHER): Payer: Medicare Other

## 2017-08-12 DIAGNOSIS — D472 Monoclonal gammopathy: Secondary | ICD-10-CM | POA: Diagnosis present

## 2017-08-13 LAB — IMMUNOFIXATION ELECTROPHORESIS
IGM (IMMUNOGLOBULIN M), SRM: 41 mg/dL (ref 15–143)
IgA/Immunoglobulin A, Serum: 611 mg/dL — ABNORMAL HIGH (ref 61–437)
IgG (Immunoglobin G), Serum: 957 mg/dL (ref 700–1600)
TOTAL PROTEIN: 6.9 g/dL (ref 6.0–8.5)

## 2017-08-13 LAB — CBC WITH DIFFERENTIAL/PLATELET
BASOS ABS: 0 10*3/uL (ref 0.0–0.2)
Basos: 0 %
EOS (ABSOLUTE): 0.4 10*3/uL (ref 0.0–0.4)
Eos: 6 %
HEMOGLOBIN: 13.4 g/dL (ref 13.0–17.7)
Hematocrit: 39.6 % (ref 37.5–51.0)
Immature Grans (Abs): 0 10*3/uL (ref 0.0–0.1)
Immature Granulocytes: 0 %
LYMPHS ABS: 1.7 10*3/uL (ref 0.7–3.1)
Lymphs: 23 %
MCH: 36.7 pg — AB (ref 26.6–33.0)
MCHC: 33.8 g/dL (ref 31.5–35.7)
MCV: 109 fL — AB (ref 79–97)
Monocytes Absolute: 0.9 10*3/uL (ref 0.1–0.9)
Monocytes: 12 %
Neutrophils Absolute: 4.4 10*3/uL (ref 1.4–7.0)
Neutrophils: 59 %
PLATELETS: 176 10*3/uL (ref 150–379)
RBC: 3.65 x10E6/uL — ABNORMAL LOW (ref 4.14–5.80)
RDW: 13.2 % (ref 12.3–15.4)
WBC: 7.4 10*3/uL (ref 3.4–10.8)

## 2017-08-13 LAB — KAPPA/LAMBDA LIGHT CHAINS
IG LAMBDA FREE LIGHT CHAIN: 33.4 mg/L — AB (ref 5.7–26.3)
Ig Kappa Free Light Chain: 27.7 mg/L — ABNORMAL HIGH (ref 3.3–19.4)
KAPPA/LAMBDA FLC RATIO: 0.83 (ref 0.26–1.65)

## 2017-08-19 ENCOUNTER — Ambulatory Visit (INDEPENDENT_AMBULATORY_CARE_PROVIDER_SITE_OTHER): Payer: Medicare Other | Admitting: Pharmacist Clinician (PhC)/ Clinical Pharmacy Specialist

## 2017-08-19 DIAGNOSIS — Z7901 Long term (current) use of anticoagulants: Secondary | ICD-10-CM

## 2017-08-19 DIAGNOSIS — I4891 Unspecified atrial fibrillation: Secondary | ICD-10-CM

## 2017-08-19 DIAGNOSIS — I482 Chronic atrial fibrillation: Secondary | ICD-10-CM

## 2017-08-19 DIAGNOSIS — I4821 Permanent atrial fibrillation: Secondary | ICD-10-CM

## 2017-08-19 LAB — POCT INR: INR: 2.5

## 2017-08-20 ENCOUNTER — Telehealth: Payer: Self-pay | Admitting: *Deleted

## 2017-08-20 NOTE — Telephone Encounter (Signed)
Pt called / informed "antibody level slightly higher than last year; everything else stable. Will review at time of 12/3 appt here" per Dr Beryle Beams. Stated he has been on 2 antibiotics for skin infection ( on one now). Stated he will see [place] on 12/1.

## 2017-08-20 NOTE — Telephone Encounter (Signed)
-----   Message from Annia Belt, MD sent at 08/19/2017 10:18 AM EST ----- Call pt: antibody level slightly higher than last year; everything else stable. Will review at time of 12/3 appt here

## 2017-08-20 NOTE — Telephone Encounter (Signed)
Sorry - I meant the 12/3 (typing error).

## 2017-08-20 NOTE — Telephone Encounter (Signed)
No he won't: 12/1 is a Saturday so he sees me on 12/3!

## 2017-08-24 ENCOUNTER — Ambulatory Visit: Payer: Medicare Other | Admitting: Oncology

## 2017-09-07 ENCOUNTER — Ambulatory Visit (INDEPENDENT_AMBULATORY_CARE_PROVIDER_SITE_OTHER): Payer: Medicare Other | Admitting: Pharmacist Clinician (PhC)/ Clinical Pharmacy Specialist

## 2017-09-07 ENCOUNTER — Ambulatory Visit: Payer: Medicare Other | Admitting: Oncology

## 2017-09-07 DIAGNOSIS — I4891 Unspecified atrial fibrillation: Secondary | ICD-10-CM | POA: Diagnosis not present

## 2017-09-07 DIAGNOSIS — I4821 Permanent atrial fibrillation: Secondary | ICD-10-CM

## 2017-09-07 DIAGNOSIS — Z7901 Long term (current) use of anticoagulants: Secondary | ICD-10-CM

## 2017-09-07 DIAGNOSIS — I482 Chronic atrial fibrillation: Secondary | ICD-10-CM

## 2017-09-07 LAB — POCT INR: INR: 2

## 2017-09-14 ENCOUNTER — Ambulatory Visit: Payer: Medicare Other | Admitting: Oncology

## 2017-09-23 ENCOUNTER — Ambulatory Visit (INDEPENDENT_AMBULATORY_CARE_PROVIDER_SITE_OTHER): Payer: Medicare Other | Admitting: *Deleted

## 2017-09-23 DIAGNOSIS — I442 Atrioventricular block, complete: Secondary | ICD-10-CM

## 2017-09-24 ENCOUNTER — Encounter: Payer: Self-pay | Admitting: Cardiology

## 2017-09-24 NOTE — Progress Notes (Signed)
Remote pacemaker transmission.   

## 2017-10-03 LAB — CUP PACEART REMOTE DEVICE CHECK
Battery Remaining Longevity: 157 mo
Brady Statistic RV Percent Paced: 99 %
Implantable Lead Implant Date: 20080311
Implantable Lead Model: 5076
Implantable Lead Model: 5076
Lead Channel Impedance Value: 735 Ohm
Lead Channel Pacing Threshold Amplitude: 1 V
Lead Channel Pacing Threshold Pulse Width: 0.4 ms
Lead Channel Setting Pacing Pulse Width: 0.4 ms
MDC IDC LEAD IMPLANT DT: 20080311
MDC IDC LEAD LOCATION: 753859
MDC IDC LEAD LOCATION: 753860
MDC IDC MSMT BATTERY IMPEDANCE: 136 Ohm
MDC IDC MSMT BATTERY VOLTAGE: 2.78 V
MDC IDC MSMT LEADCHNL RA IMPEDANCE VALUE: 67 Ohm
MDC IDC PG IMPLANT DT: 20161206
MDC IDC SESS DTM: 20181219183205
MDC IDC SET LEADCHNL RV PACING AMPLITUDE: 2 V
MDC IDC SET LEADCHNL RV SENSING SENSITIVITY: 2.8 mV

## 2017-10-09 ENCOUNTER — Other Ambulatory Visit: Payer: Self-pay | Admitting: Cardiovascular Disease

## 2017-10-15 ENCOUNTER — Ambulatory Visit (INDEPENDENT_AMBULATORY_CARE_PROVIDER_SITE_OTHER): Payer: Medicare Other | Admitting: Pharmacist Clinician (PhC)/ Clinical Pharmacy Specialist

## 2017-10-15 DIAGNOSIS — I4891 Unspecified atrial fibrillation: Secondary | ICD-10-CM

## 2017-10-15 DIAGNOSIS — Z7901 Long term (current) use of anticoagulants: Secondary | ICD-10-CM | POA: Diagnosis not present

## 2017-10-15 DIAGNOSIS — I4821 Permanent atrial fibrillation: Secondary | ICD-10-CM

## 2017-10-15 DIAGNOSIS — I482 Chronic atrial fibrillation: Secondary | ICD-10-CM

## 2017-10-15 LAB — POCT INR: INR: 2.4

## 2017-11-01 ENCOUNTER — Other Ambulatory Visit: Payer: Self-pay | Admitting: Neurology

## 2017-11-09 ENCOUNTER — Telehealth: Payer: Self-pay | Admitting: Neurology

## 2017-11-09 NOTE — Telephone Encounter (Signed)
Patient requesting refill of gabapentin (NEURONTIN) 100 MG capsule called to [facility]. The patient has an appointment with Dr. Jaynee Eagles on 12-22-17. I advised Dr. Jaynee Eagles is out of the office and will send message to her nurse.

## 2017-11-10 MED ORDER — GABAPENTIN 100 MG PO CAPS: 100.0000 mg | ORAL_CAPSULE | Freq: Three times a day (TID) | ORAL | 0 refills | Status: DC

## 2017-11-10 NOTE — Telephone Encounter (Signed)
Prescribed a 2 month supply of Gabapentin 100 mg TID to [facility] to get him through his upcoming appointment with Dr. Jaynee Eagles.

## 2017-11-10 NOTE — Addendum Note (Signed)
Addended by: Gildardo Griffes on: 11/10/2017 07:30 AM   Modules accepted: Orders

## 2017-11-11 ENCOUNTER — Ambulatory Visit (INDEPENDENT_AMBULATORY_CARE_PROVIDER_SITE_OTHER): Payer: Medicare Other | Admitting: Pharmacist

## 2017-11-11 DIAGNOSIS — I4821 Permanent atrial fibrillation: Secondary | ICD-10-CM

## 2017-11-11 DIAGNOSIS — I4891 Unspecified atrial fibrillation: Secondary | ICD-10-CM

## 2017-11-11 DIAGNOSIS — Z7901 Long term (current) use of anticoagulants: Secondary | ICD-10-CM

## 2017-11-11 DIAGNOSIS — I482 Chronic atrial fibrillation: Secondary | ICD-10-CM | POA: Diagnosis not present

## 2017-11-11 LAB — POCT INR: INR: 2.2

## 2017-11-23 ENCOUNTER — Encounter: Payer: Self-pay | Admitting: Oncology

## 2017-11-23 ENCOUNTER — Other Ambulatory Visit: Payer: Self-pay

## 2017-11-23 ENCOUNTER — Ambulatory Visit (INDEPENDENT_AMBULATORY_CARE_PROVIDER_SITE_OTHER): Payer: Medicare Other | Admitting: Oncology

## 2017-11-23 VITALS — BP 141/66 | HR 76 | Temp 97.5°F | Ht 71.0 in | Wt 252.0 lb

## 2017-11-23 DIAGNOSIS — K5792 Diverticulitis of intestine, part unspecified, without perforation or abscess without bleeding: Secondary | ICD-10-CM | POA: Diagnosis not present

## 2017-11-23 DIAGNOSIS — Z888 Allergy status to other drugs, medicaments and biological substances status: Secondary | ICD-10-CM | POA: Diagnosis not present

## 2017-11-23 DIAGNOSIS — I2510 Atherosclerotic heart disease of native coronary artery without angina pectoris: Secondary | ICD-10-CM

## 2017-11-23 DIAGNOSIS — G5603 Carpal tunnel syndrome, bilateral upper limbs: Secondary | ICD-10-CM

## 2017-11-23 DIAGNOSIS — Z87891 Personal history of nicotine dependence: Secondary | ICD-10-CM | POA: Diagnosis not present

## 2017-11-23 DIAGNOSIS — M1990 Unspecified osteoarthritis, unspecified site: Secondary | ICD-10-CM

## 2017-11-23 DIAGNOSIS — E039 Hypothyroidism, unspecified: Secondary | ICD-10-CM

## 2017-11-23 DIAGNOSIS — Z951 Presence of aortocoronary bypass graft: Secondary | ICD-10-CM | POA: Diagnosis not present

## 2017-11-23 DIAGNOSIS — I482 Chronic atrial fibrillation: Secondary | ICD-10-CM

## 2017-11-23 DIAGNOSIS — G4733 Obstructive sleep apnea (adult) (pediatric): Secondary | ICD-10-CM

## 2017-11-23 DIAGNOSIS — Z7901 Long term (current) use of anticoagulants: Secondary | ICD-10-CM

## 2017-11-23 DIAGNOSIS — Z9989 Dependence on other enabling machines and devices: Secondary | ICD-10-CM

## 2017-11-23 DIAGNOSIS — D472 Monoclonal gammopathy: Secondary | ICD-10-CM

## 2017-11-23 DIAGNOSIS — Z8679 Personal history of other diseases of the circulatory system: Secondary | ICD-10-CM | POA: Diagnosis not present

## 2017-11-23 DIAGNOSIS — G629 Polyneuropathy, unspecified: Secondary | ICD-10-CM

## 2017-11-23 DIAGNOSIS — I10 Essential (primary) hypertension: Secondary | ICD-10-CM

## 2017-11-23 DIAGNOSIS — Z950 Presence of cardiac pacemaker: Secondary | ICD-10-CM

## 2017-11-23 NOTE — Patient Instructions (Signed)
Lab 09/06/18. MD visit 1 week after lab

## 2017-11-23 NOTE — Progress Notes (Signed)
Hematology and Oncology Follow Up Visit  Mike Price [id number] 07/23/1935 83 y.o. 11/23/2017 4:28 PM   Principle Diagnosis: Encounter Diagnosis  Name Primary?  [name] Kitchen MGUS (monoclonal gammopathy of unknown significance) Yes  Clinical summary: Retired Mike Price referred here in November 2016 by neurology for further evaluation of progressive stocking and glove neuropathy.  As part of his neuro evaluation he was found to have a monoclonal IgA gammopathy.  Normal renal function.  No proteinuria.  Normal serum free light chain ratio.  24-hour urine total protein 120 mg with no monoclonal protein on immunofixation electrophoresis.  He was told he had bilateral carpal tunnel syndrome which brought up the possibility of amyloidosis.  He had right carpal tunnel surgery but no material was submitted to pathology despite my request.  He has been followed with serial lab since that time.   Interim History:  Overall doing well.  History of coronary artery disease status post bypass surgery.  He has a pacemaker.  Chronic atrial fibrillation.  He is on chronic anticoagulation with warfarin managed by [facility].  His primary care physician is Dr Mike Price.  His computer system is not connected with ours so I do not have any blood chemistries on file since 2016.  He is getting his immunoglobulins monitored through our office.  Lab done in anticipation of today's visit on August 12, 2017 were overall stable.  Slight increase in his total IgA immunoglobulin at 611 mg percent compared with 519 in October 2017 and 542 in September 2016 with normal range up to 437.  Serum free light chain ratio remains normal at 0.83 and there is no concomitant suppression of IgG or IgM.  Hemoglobin stable at 13.4 g.  He had normal renal function as of 2016.  Subsequent values done through his primary care physician's office and not available at this time. He does still get some intermittent paresthesias of his  hands and feet.  He had right carpal tunnel surgery last year.  I attempted to have specimen sent for pathologic analysis for amyloid but this did not happen.  Little suspicion for this with normal serum free light chain ratio and no monoclonal proteins or increased total protein in a 24-hour urine collection done in October 2016.  He missed his appointment with me in November because of inclement weather.  He has chronic diverticulitis and has 3-4 flareups per year.  None of these required hospitalization over the last year. Medications: reviewed  Allergies:  Allergies  Allergen Reactions  . Lisinopril Cough  . Mefloquine Other (See Comments)    "made me crazy" anxious, upset    Review of Systems: See interim history Remaining ROS negative:   Physical Exam: Blood pressure (!) 141/66, pulse 76, temperature (!) 97.5 F (36.4 C), temperature source Oral, height _0  (1.803 m), weight 252 lb (114.3 kg), SpO2 95 %. Wt Readings from Last 3 Encounters:  11/23/17 252 lb (114.3 kg)  12/24/16 255 lb (115.7 kg)  12/12/16 256 lb (116.1 kg)     General appearance: Well-nourished Caucasian man Mike Price: Normal male pattern baldness.  Pharynx no erythema, exudate, mass, or ulcer. No thyromegaly or thyroid nodules Lymph nodes: No cervical, supraclavicular, or axillary lymphadenopathy Breasts:  Lungs: Clear to auscultation, resonant to percussion throughout Heart: Regular rhythm, no murmur, no gallop, no rub, no click, chronic edema.  Pacemaker battery pack subcutaneous tissues left subpectoral region. Abdomen: Soft, nontender, normal bowel sounds, no mass, no organomegaly Extremities: Chronic brawny 2+  edema to the knees edema, no calf tenderness Musculoskeletal: no joint deformities GU:  Vascular: Carotid pulses 2+, no bruits, Neurologic: Alert, oriented, PERRLA, optic discs poorly visualized.  vessels normal, no hemorrhage or exudate, cranial nerves grossly normal, motor strength 5 over 5,  reflexes absent symmetric at the knees, 1+ symmetric at the biceps, upper body coordination normal, gait not tested Skin: No rash or ecchymosis  Lab Results: CBC W/Diff    Component Value Date/Time   WBC 7.4 08/12/2017 1148   WBC 7.3 09/04/2015 1059   RBC 3.65 (L) 08/12/2017 1148   RBC 4.08 (L) 09/04/2015 1059   HGB 13.4 08/12/2017 1148   HCT 39.6 08/12/2017 1148   PLT 176 08/12/2017 1148   MCV 109 (H) 08/12/2017 1148   MCH 36.7 (H) 08/12/2017 1148   MCH 35.3 (H) 09/04/2015 1059   MCHC 33.8 08/12/2017 1148   MCHC 33.1 09/04/2015 1059   RDW 13.2 08/12/2017 1148   LYMPHSABS 1.7 08/12/2017 1148   MONOABS 0.8 12/17/2012 1300   EOSABS 0.4 08/12/2017 1148   BASOSABS 0.0 08/12/2017 1148     Chemistry      Component Value Date/Time   NA 137 09/04/2015 1059   NA 139 06/19/2015 1637   K 4.7 09/04/2015 1059   CL 99 09/04/2015 1059   CO2 32 (H) 09/04/2015 1059   BUN 16 09/04/2015 1059   BUN 18 06/19/2015 1637   CREATININE 0.75 09/04/2015 1059      Component Value Date/Time   CALCIUM 9.0 09/04/2015 1059   ALKPHOS 90 09/04/2015 1059   AST 31 09/04/2015 1059   ALT 20 09/04/2015 1059   BILITOT 1.8 (H) 09/04/2015 1059   BILITOT 1.3 (H) 06/19/2015 1637       Radiological Studies: No results found.  Impression:  1.  IgA monoclonal gammopathy of undetermined significance.  Stable now out 3 years from diagnosis.  Low chance of conversion to multiple myeloma.  I will continue to monitor lab on an annual basis.  I will see him one more time next year prior to retiring.  2.  Coronary artery disease status post bypass surgery  3.  Chronic atrial fibrillation on chronic anticoagulation  4.  Status post permanent pacemaker for complete heart block  5.  Essential hypertension  6.  Hypothyroidism  7.  Obstructive sleep apnea on CPAP  8.  Degenerative arthritis  CC: Patient Care Team: Mike Erp, MD as PCP - General (Internal Medicine)   Mike Hopper, MD, [place]   Hematology-Oncology/Internal Medicine     2/18/20194:28 PM

## 2017-12-11 ENCOUNTER — Ambulatory Visit (INDEPENDENT_AMBULATORY_CARE_PROVIDER_SITE_OTHER): Payer: Medicare Other | Admitting: Pharmacist

## 2017-12-11 DIAGNOSIS — Z7901 Long term (current) use of anticoagulants: Secondary | ICD-10-CM | POA: Diagnosis not present

## 2017-12-11 DIAGNOSIS — I4891 Unspecified atrial fibrillation: Secondary | ICD-10-CM | POA: Diagnosis not present

## 2017-12-11 DIAGNOSIS — I482 Chronic atrial fibrillation: Secondary | ICD-10-CM

## 2017-12-11 DIAGNOSIS — I4821 Permanent atrial fibrillation: Secondary | ICD-10-CM

## 2017-12-11 LAB — POCT INR: INR: 2.3

## 2017-12-22 ENCOUNTER — Ambulatory Visit (INDEPENDENT_AMBULATORY_CARE_PROVIDER_SITE_OTHER): Payer: Medicare Other | Admitting: Cardiovascular Disease

## 2017-12-22 ENCOUNTER — Ambulatory Visit (INDEPENDENT_AMBULATORY_CARE_PROVIDER_SITE_OTHER): Payer: Medicare Other | Admitting: Pharmacist

## 2017-12-22 ENCOUNTER — Ambulatory Visit (INDEPENDENT_AMBULATORY_CARE_PROVIDER_SITE_OTHER): Payer: Medicare Other | Admitting: Neurology

## 2017-12-22 ENCOUNTER — Encounter: Payer: Self-pay | Admitting: Neurology

## 2017-12-22 ENCOUNTER — Encounter: Payer: Self-pay | Admitting: Cardiovascular Disease

## 2017-12-22 VITALS — BP 110/72 | HR 77 | Ht 71.0 in | Wt 247.0 lb

## 2017-12-22 VITALS — BP 128/82 | HR 77 | Ht 71.0 in | Wt 247.0 lb

## 2017-12-22 DIAGNOSIS — I482 Chronic atrial fibrillation: Secondary | ICD-10-CM

## 2017-12-22 DIAGNOSIS — I4821 Permanent atrial fibrillation: Secondary | ICD-10-CM

## 2017-12-22 DIAGNOSIS — F1010 Alcohol abuse, uncomplicated: Secondary | ICD-10-CM | POA: Diagnosis not present

## 2017-12-22 DIAGNOSIS — I25700 Atherosclerosis of coronary artery bypass graft(s), unspecified, with unstable angina pectoris: Secondary | ICD-10-CM

## 2017-12-22 DIAGNOSIS — I10 Essential (primary) hypertension: Secondary | ICD-10-CM | POA: Diagnosis not present

## 2017-12-22 DIAGNOSIS — I4891 Unspecified atrial fibrillation: Secondary | ICD-10-CM

## 2017-12-22 DIAGNOSIS — Z7901 Long term (current) use of anticoagulants: Secondary | ICD-10-CM | POA: Diagnosis not present

## 2017-12-22 DIAGNOSIS — Z950 Presence of cardiac pacemaker: Secondary | ICD-10-CM | POA: Diagnosis not present

## 2017-12-22 DIAGNOSIS — E7800 Pure hypercholesterolemia, unspecified: Secondary | ICD-10-CM

## 2017-12-22 DIAGNOSIS — G629 Polyneuropathy, unspecified: Secondary | ICD-10-CM | POA: Diagnosis not present

## 2017-12-22 DIAGNOSIS — R251 Tremor, unspecified: Secondary | ICD-10-CM | POA: Diagnosis not present

## 2017-12-22 LAB — POCT INR: INR: 2.6

## 2017-12-22 MED ORDER — APIXABAN 5 MG PO TABS: 5.0000 mg | ORAL_TABLET | Freq: Two times a day (BID) | ORAL | 6 refills | Status: DC

## 2017-12-22 NOTE — Assessment & Plan Note (Signed)
History of permanent transvenous pacemaker insertion for complete heart block followed by Dr. Sallyanne Kuster. He did have a generator change last year.

## 2017-12-22 NOTE — Assessment & Plan Note (Signed)
History of hyperlipidemia on statin therapy followed by his PCP 

## 2017-12-22 NOTE — Patient Instructions (Addendum)
Essential Tremor A tremor is trembling or shaking that you cannot control. Most tremors affect the hands or arms. Tremors can also affect the head, vocal cords, face, and other parts of the body. Essential tremor is a tremor without a known cause. What are the causes? Essential tremor has no known cause. What increases the risk? You may be at greater risk of essential tremor if:  You have a family member with essential tremor.  You are age 81 or older.  You take certain medicines.  What are the signs or symptoms? The main sign of a tremor is uncontrolled and unintentional rhythmic shaking of a body part.  You may have difficulty eating with a spoon or fork.  You may have difficulty writing.  You may nod your head up and down or side to side.  You may have a quivering voice.  Your tremors:  May get worse over time.  May come and go.  May be more noticeable on one side of your body.  May get worse due to stress, fatigue, caffeine, and extreme heat or cold.  How is this diagnosed? Your health care provider can diagnose essential tremor based on your symptoms, medical history, and a physical examination. There is no single test to diagnose an essential tremor. However, your health care provider may perform a variety of tests to rule out other conditions. Tests may include:  Blood and urine tests.  Imaging studies of your brain, such as: ? CT scan. ? MRI.  A test that measures involuntary muscle movement (electromyogram).  How is this treated? Your tremors may go away without treatment. Mild tremors may not need treatment if they do not affect your day-to-day life. Severe tremors may need to be treated using one or a combination of the following options:  Medicines. This may include medicine that is injected.  Lifestyle changes.  Physical therapy.  Follow these instructions at home:  Take medicines only as directed by your health care provider.  Limit alcohol  intake to no more than 1 drink per day for nonpregnant women and 2 drinks per day for men. One drink equals 12 oz of beer, 5 oz of wine, or 1 oz of hard liquor.  Do not use any tobacco products, including cigarettes, chewing tobacco, or electronic cigarettes. If you need help quitting, ask your health care provider.  Take medicines only as directed by your health care provider.  Avoid extreme heat or cold.  Limit the amount of caffeine you consumeas directed by your health care provider.  Try to get eight hours of sleep each night.  Find ways to manage your stress, such as meditation or yoga.  Keep all follow-up visits as directed by your health care provider. This is important. This includes any physical therapy visits. Contact a health care provider if:  You experience any changes in the location or intensity of your tremors.  You start having a tremor after starting a new medicine.  You have tremor with other symptoms such as: ? Numbness. ? Tingling. ? Pain. ? Weakness.  Your tremor gets worse.  Your tremor interferes with your daily life. This information is not intended to replace advice given to you by your health care provider. Make sure you discuss any questions you have with your health care provider. Document Released: 10/13/2014 Document Revised: 02/28/2016 Document Reviewed: 03/20/2014 Elsevier Interactive Patient Education  2018 [employer]  

## 2017-12-22 NOTE — Assessment & Plan Note (Signed)
History of essential hypertension blood pressure measures A1 10/72. He is on losartan and metoprolol. Continue current meds at current dosing.

## 2017-12-22 NOTE — Assessment & Plan Note (Signed)
History of permanent A. Fib on Coumadin anticoagulation status post permanent transvenous pacemaker insertion by Dr. Tami Ribas followed by Dr. Sallyanne Kuster.

## 2017-12-22 NOTE — Addendum Note (Signed)
Addended by: Therisa Doyne on: 12/22/2017 02:43 PM   Modules accepted: Orders

## 2017-12-22 NOTE — Progress Notes (Signed)
[id number]    Provider:  Dr Mike Price Referring Provider: [name], MD Primary Care Physician:  Mike Erp, MD  CC:  Neuropathy and tremor  Interval history: Here for follow up of long-standing neuropathy.  His neuropathy is stable. He tore his right rotator cuff many years ago, he saw an orthopaedist. No falls. His neuropathy is stable. He has a tremor may be essential tremor, discussed and he is already on a b-blocker and neurontin (could not tolerate higher doses) will hold on on treatment. Discussed essential tremor, may be familial as father had it, will progress but at this time he can manage. Also may be due to alcohol abuse.   Interval history 09/24/2016: Patient is here for follow up today of neuropathy for over 17 years. Extensive workups in the past including labwork and emg/ncs. labwork revealed IgA monoclonal protein with lambda light chain specificity and he was referred to hematology. EMG/NCS revealed moderately severe bilateral CTS in addition to his idiopathic polyneuropathy.  His wife had a TIA last week. His feet are stable. He has numbness in the feet from years of idiopathic neuropathy.[name] Kitchen He is a retired Counsellor. He has falled three times this summer. He was using a cane at the time and now he has a walker. He tripped over a ridge on the carpet at home once. The other time he used his cane instead of his walker. And fell on a curved slope of concrete. He was at his friend's home and the last step had nothing to hold onto and he lurched off and landed face down in the grass. I advised physical therapy for gait and balance but he declines at this time.  He drinks alcohol daily, unclear if there is an etoh component to his neuropathy. Since using the walker no falls. He drinks 2-3 "generous" martinis a day   HPI 06/19/2015:  Mike Price is a 83 y.o. male here as a referral from Dr. Nyoka Price for neuropathy and tremor. PMH CABG and HLD, balance  problems, generalized weakness.  In 2001 he had 2x CABG completed. That's when the neuropathy in the feet started, like they were shrink wrapped. He later discovered he had thyroid problems. He had shooting pain in the feet and burning. More recently he has noticed his hands shaking, dropping objects out of his hands, shaking. He had an emg/ncs 10 years ago here in the office and he was diagnosed with neuropathy, doesn't want that again. His feet feel like wooden, numb now. The burning and shooting pains have stopped. His balance is worsening. He missed a step and fell and now he is scared and says this contributes to his balance issues. He uses a walker. He denies low back pain and he has a replaced left hip and a replaced right knee. His left knee needs replacement as well. He stumbles over his feet, he can't feel his feet. He still drives and he can feel the pedals. Symptoms in the hands started a year ago. His hands feel clumsy. Tremor started a year ago, especially when he is writing. Notices it more with action. Difficulty holding a coffee cup. 2-3 drinks martinis a day and in the past has drunk more but infrequently more than 2-3 a day. Dad may have had a tremor. Grandfather with lupus. He has neck pain which is mild. He was treated in the past for TB many years ago but doesn't remember what medication. He took mefloquin in the past for malaria.  He denies many of the medications that can induce polyneuropathy such as certain antibiotics, chemotherapeutic agents, cardiovascular drugs, rheumatologic drugs, and other miscellaneous substances and toxins.  Reviewed notes, labs and imaging from outside physicians, which showed:   CT HEAD WITHOUT CONTRAST, personally reviewed images and agree with findings  Findings: Mild atrophy. Mild chronic microvascular ischemic change in the white matter. Negative for acute infarct. Negative for hemorrhage or mass. No midline shift. Negative for  skull fracture.  IMPRESSION: Mild chronic microvascular ischemia. No acute abnormality.  Review of Systems: Patient complains of symptoms per HPI as well as the following symptoms: Swelling in legs, anemia, easy bruising, easy bleeding, feeling cold, flushing, joint pain, joint swelling, cramps, constipation, moles, impotence, runny nose, numbness, weakness, tremor, anxiety, decreased energy. Pertinent negatives per HPI. All others negative.   Social History   Socioeconomic History  . Marital status: Married    Spouse name: Mike Price  . Number of children: Not on file  . Years of education: Not on file  . Highest education level: Not on file  Social Needs  . Financial resource strain: Not on file  . Food insecurity - worry: Not on file  . Food insecurity - inability: Not on file  . Transportation needs - medical: Not on file  . Transportation needs - non-medical: Not on file  Occupational History  . Occupation: Priest   Tobacco Use  . Smoking status: Former Smoker    Last attempt to quit: 01/12/1984    Years since quitting: 33.9  . Smokeless tobacco: Never Used  Substance and Sexual Activity  . Alcohol use: Yes    Alcohol/week: 1.8 oz    Types: 3 Shots of liquor per week    Comment: 2-3 drinks daily  . Drug use: No  . Sexual activity: Not on file  Other Topics Concern  . Not on file  Social History Narrative   Lives at home with wife, Mike Price   Caffeine use: 2 cups coffee per day    Family History  Problem Relation Age of Onset  . Stroke Unknown        Grandfather   . Neuropathy Neg Hx     Past Medical History:  Diagnosis Date  . Anemia   . Anxiety 01-11-13   tx. Clonazepam.  . Arthritis    Osteoarthritis-knees, fingers  . CAD (coronary artery disease)   . Diverticulitis   . Dysrhythmia 01-11-13    hx. A. Fib-Pacemaker implanted left chest  . Edema extremities 01-11-13   retains fluid in legs-right greater than left.  [name] Kitchen GERD (gastroesophageal reflux disease)   .  Hyperlipemia   . Hypertension   . Hypothyroidism   [name] 07/10/2015   IgA 542 mg% 06/19/15. IgA lambda paraprotein on IFE, normal IgG & IgM  . Neuromuscular disorder (HCC)    neuropathy in feet  . Obstructive sleep apnea    on C Pap  . Pacemaker 12/15/06   3'08  . Thyroid disease   . TMJ tenderness 01-11-13   right side-has issues from time to time.    Past Surgical History:  Procedure Laterality Date  . APPENDECTOMY    . CARDIAC CATHETERIZATION  03/28/08   patent grafts, nl EF  . CARPAL TUNNEL RELEASE Right 2018  . CERVICAL LAMINECTOMY    . CORONARY ARTERY BYPASS GRAFT  2001   4 vessels-'00  . EP IMPLANTABLE DEVICE N/A 09/11/2015   Procedure: PPM Generator Changeout;  Surgeon: Dani Gobble Croitoru,  MD;  Location: [facility];  Service: Cardiovascular;  Laterality: N/A;  . HERNIA REPAIR    . INSERT / REPLACE / REMOVE PACEMAKER     '08-inserted left chest  . JOINT REPLACEMENT     LTHA  . NM MYOCAR PERF WALL MOTION  02/12/2012   small fixed distal anteroapical/apical defect. No reversible ischemia  . PACEMAKER INSERTION  12/15/06   medtronic  . TONSILLECTOMY     child  . TOTAL KNEE ARTHROPLASTY Right 01/17/2013   Procedure: RIGHT TOTAL KNEE ARTHROPLASTY;  Surgeon: Gearlean Alf, MD;  Location: [facility];  Service: Orthopedics;  Laterality: Right;  . US ECHOCARDIOGRAPHY  12/28/2007   LA mod. dilated,RA mildly dilated    Current Outpatient Medications  Medication Sig Dispense Refill  . acetaminophen (TYLENOL) 500 MG tablet Take 1,000 mg by mouth every 6 (six) hours as needed.    [name] Kitchen amoxicillin-clavulanate (AUGMENTIN) 875-125 MG tablet Take 1 tablet by mouth every 12 (twelve) hours. 14 tablet 0  . atorvastatin (LIPITOR) 40 MG tablet Take 40 mg by mouth every evening.     . bisacodyl (DULCOLAX) 10 MG suppository Place 1 suppository (10 mg total) rectally daily as needed. 12 suppository 0  . clonazePAM (KLONOPIN) 0.5 MG tablet Take  0.25-0.5 mg by mouth 3 (three) times daily as needed for anxiety. Takes 1/2 tablet twice daily then a whole tablet at bedtime    . Dextromethorphan-Guaifenesin (TUSSIN DM PO) Take by mouth as needed.     . doxazosin (CARDURA) 2 MG tablet Take 1 mg by mouth at bedtime.    [name] Kitchen ezetimibe (ZETIA) 10 MG tablet Take 10 mg by mouth every evening.     . ferrous sulfate 325 (65 FE) MG tablet Take 325 mg by mouth every other day. On Monday, Wednesday and Friday    . furosemide (LASIX) 20 MG tablet Take 20 mg by mouth daily.     [name] Kitchen gabapentin (NEURONTIN) 100 MG capsule Take 1 capsule (100 mg total) by mouth 3 (three) times daily. Please keep follow-up appt for further refills. 180 capsule 0  . HYDROcodone-homatropine (HYDROMET) 5-1.5 MG/5ML syrup Take 5 mLs by mouth every 6 (six) hours as needed for cough. 120 mL 0  . levofloxacin (LEVAQUIN) 500 MG tablet Take 500 mg by mouth daily.    . Liniments (SALONPAS PAIN RELIEF PATCH EX) Apply 0.4 % topically as needed.    . Loratadine (CLARITIN PO) Take 1 tablet by mouth as needed.     [name] Kitchen losartan (COZAAR) 100 MG tablet Take 100 mg by mouth daily.    . metoprolol (LOPRESSOR) 100 MG tablet Take 100 mg by mouth 2 (two) times daily.    . NON FORMULARY CPAP qhs    . omeprazole (PRILOSEC) 20 MG capsule Take 20 mg by mouth 2 (two) times daily.    . Skin Protectants, Misc. (EUCERIN) cream Apply 1 application topically daily as needed for dry skin.    [name] Kitchen SYNTHROID 88 MCG tablet Take 88 mcg by mouth daily.    . traMADol (ULTRAM) 50 MG tablet Take 50 mg by mouth as needed.    [name] Kitchen apixaban (ELIQUIS) 5 MG TABS tablet Take 1 tablet (5 mg total) by mouth 2 (two) times daily. 60 tablet 6  . warfarin (COUMADIN) 5 MG tablet Take 1/2 to 1 tablet by mouth daily as directed by coumadin clinic (Patient not taking: Reported on 12/22/2017) 90 tablet 1   No current facility-administered medications for this visit.  Allergies as of 12/22/2017 - Review Complete 12/22/2017  Allergen Reaction  Noted  . Lisinopril Cough 01/11/2013  . Mefloquine Other (See Comments) 11/10/2016    Vitals: BP 128/82 (BP Location: Left Arm, Patient Position: Sitting)   Pulse 77   Ht 5\' 11"  (1.803 m)   Wt 247 lb (112 kg)   BMI 34.45 kg/m  Last Weight:  Wt Readings from Last 1 Encounters:  12/22/17 247 lb (112 kg)   Last Height:   Ht Readings from Last 1 Encounters:  12/22/17 5\' 11"  (1.803 m)   Physical exam: Exam: Gen: NAD, conversant, well nourised, obese, well groomed                     CV: RRR, no MRG. No Carotid Bruits. Mild peripheral edema, warm, nontender Eyes: Conjunctivae clear without exudates or hemorrhage  Neuro: Detailed Neurologic Exam  Speech:    Speech is normal; fluent and spontaneous with normal comprehension.  Cognition:    The patient is oriented to person, place, and time;     recent and remote memory intact;     language fluent;     normal attention, concentration,     fund of knowledge Cranial Nerves:    The pupils are equal, round, and reactive to light. The fundi are flat. Visual fields are full to finger confrontation. Extraocular movements are intact. Trigeminal sensation is intact and the muscles of mastication are normal. The face is symmetric. The palate elevates in the midline. Hearing intact. Voice is normal. Shoulder shrug is normal. The tongue has normal motion without fasciculations.   Coordination:    Normal finger to nose.  Gait: Sensory ataxia  Motor Observation: Postural high frequency low amplitude tremor with kinetic component, also tremor in voice and slightly in the head  Tone:    Normal muscle tone.    Posture:    Posture is normal. normal erect    Strength: Right delt weakness due to rotator cuff problems that are chronic otherwise  5/5 strength     Sensation:  vibartion absent at great toe but intact medial mall Decreased pp and temp to the knees and mid forearm  Slightly impaired proprioception     Reflex  Exam:  DTR's: 1+ uppers.    Deep tendon reflexes in the lower extremities are absent Toes:    The toes are downgoing bilaterally.   Clonus:    Clonus is absent.   Assessment/Plan:    RAUDEL BAZEN is a 83 y.o. male here as a referral from Dr. Nyoka Price for neuropathy and tremor. PMH CABG and HLD, balance problems, generalized weakness. He has quite a significant peripheral neuropathy with sensory ataxia. His strength is actually quite good. Symptoms started over 17 years ago and have progressed slowly. More recently patient's hands are involved. Had a long discussion with patient and his wife in the past regarding length dependent peripheral neuropathy, there are many different causes. Patient has a history of several martinis a day and maybe this has some contribution but no other significant risk factors.    - He drinks alcohol daily, unclear if there is an etoh component to his neuropathy and tremor. Since using the walker no falls. He drinks 2-3 "generous" martinis a day. He declines PT. Fall risk, discussed again, highly encouraged PT - Extensive workups in the past including labwork and emg/ncs. Labwork revealed IgA monoclonal protein with lambda light chain specificity and he was referred to hematology, follows yearly with dr  granfortuna. EMG/NCS revealed moderately severe bilateral CTS in addition to his idiopathic polyneuropathy.   - Advised drinking less than 2 appropriate sized alcoholic beverages daily on average  -  Follows with [facility] for CTS - Tremor may be familial  Essential tremor(dad had tremors) or related to alcohol abuse or noth, he chooses not to treat it at this time is manageable follow clinically. Has slightly worsened and spread to his head and voice.  - for neuropathy: continue neurontin. Could not tolerate increase in the past.    Sarina Ill, MD  [facility] [address] [place], [place] [address]  Phone  [phone] Fax [phone]  A total of 25 minutes was spent face-to-face with this patient. Over half this time was spent on counseling patient on the peripheral neuropathy diagnosis, CTS, essential tremor, alcohol abuse and differenttherapeutic options available. He is overall stable.

## 2017-12-22 NOTE — Progress Notes (Signed)
12/22/2017 Mike Price   [birth year]  [id number]  Primary Physician Mike Erp, MD Primary Cardiologist: Mike Harp MD FACP, [place], [place], [place]  HPI:  Mike Price is a 83 y.o.  moderately overweight married Caucasian male father of 1 child, grandfather to 61 step-grandchildren who I last saw 12/12/16. He has a history of coronary artery bypass grafting in 2001. He had a permanent transvenous pacemaker placed for PAF and underwent cardioversion by Dr. Tami Price January 24, 2008. His other problems include hypertension and hyperlipidemia. He was cathed by Dr. Melvern Price March 28, 2008 revealing patent grafts with normal LV function. 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 He denies chest pain or shortness of breath. He had a left total hip replacement in October of 2011. Myoview performed a year ago was nonischemic.several months he's noticed increasing dyspnea on exertion. He saw Dr. Nyoka Price, his primary care physician, who referred him back for further evaluation. Apparently shortness of breath as his presenting symptom prior to coronary artery bypass grafting.a Myoview stress test performed 05/25/14 was low risk and nonischemic. He is on C Pap for obstructive sleep apnea. Since I saw him a year ago he underwent permanent transvenous pacemaker generator change out by Dr. Sallyanne Price 09/11/15. During our conversation, patient described BLE discomfort with some qualities c/w claudication. Lower extremity arterial Doppler studies performed 12/05/15 were normal. Since I saw him a year ago he's remained clinically stable.     Current Meds  Medication Sig  . acetaminophen (TYLENOL) 500 MG tablet Take 1,000 mg by mouth every 6 (six) hours as needed.  [name] Kitchen amoxicillin-clavulanate (AUGMENTIN) 875-125 MG tablet Take 1 tablet by mouth every 12 (twelve) hours.  [name] Kitchen atorvastatin (LIPITOR) 40 MG tablet Take 40 mg by mouth every evening.   .  bisacodyl (DULCOLAX) 10 MG suppository Place 1 suppository (10 mg total) rectally daily as needed.  . clonazePAM (KLONOPIN) 0.5 MG tablet Take 0.25-0.5 mg by mouth 3 (three) times daily as needed for anxiety. Takes 1/2 tablet twice daily then a whole tablet at bedtime  . Dextromethorphan-Guaifenesin (TUSSIN DM PO) Take by mouth as needed.   . doxazosin (CARDURA) 2 MG tablet Take 1 mg by mouth at bedtime.  [name] Kitchen ezetimibe (ZETIA) 10 MG tablet Take 10 mg by mouth every evening.   . ferrous sulfate 325 (65 FE) MG tablet Take 325 mg by mouth every other day. On Monday, Wednesday and Friday  . furosemide (LASIX) 20 MG tablet Take 20 mg by mouth daily.   [name] Kitchen gabapentin (NEURONTIN) 100 MG capsule Take 1 capsule (100 mg total) by mouth 3 (three) times daily. Please keep follow-up appt for further refills.  [name] Kitchen HYDROcodone-homatropine (HYDROMET) 5-1.5 MG/5ML syrup Take 5 mLs by mouth every 6 (six) hours as needed for cough.  [name] Kitchen levofloxacin (LEVAQUIN) 500 MG tablet Take 500 mg by mouth daily.  . Liniments (SALONPAS PAIN RELIEF PATCH EX) Apply 0.4 % topically as needed.  . Loratadine (CLARITIN PO) Take 1 tablet by mouth as needed.   [name] Kitchen losartan (COZAAR) 100 MG tablet Take 100 mg by mouth daily.  . metoprolol (LOPRESSOR) 100 MG tablet Take 100 mg by mouth 2 (two) times daily.  . NON FORMULARY CPAP qhs  . omeprazole (PRILOSEC) 20 MG capsule Take 20 mg by mouth 2 (two) times daily.  . Skin Protectants, Misc. (EUCERIN) cream Apply 1 application topically daily as needed for dry skin.  [name] Kitchen SYNTHROID 88 MCG tablet Take 88  mcg by mouth daily.  . traMADol (ULTRAM) 50 MG tablet Take 50 mg by mouth as needed.  . warfarin (COUMADIN) 5 MG tablet Take 1/2 to 1 tablet by mouth daily as directed by coumadin clinic     Allergies  Allergen Reactions  . Lisinopril Cough  . Mefloquine Other (See Comments)    "made me crazy" anxious, upset    Social History   Socioeconomic History  . Marital status: Married    Spouse name:  Faith  . Number of children: Not on file  . Years of education: Not on file  . Highest education level: Not on file  Social Needs  . Financial resource strain: Not on file  . Food insecurity - worry: Not on file  . Food insecurity - inability: Not on file  . Transportation needs - medical: Not on file  . Transportation needs - non-medical: Not on file  Occupational History  . Occupation: Priest   Tobacco Use  . Smoking status: Former Smoker    Last attempt to quit: 01/12/1984    Years since quitting: 33.9  . Smokeless tobacco: Never Used  Substance and Sexual Activity  . Alcohol use: Yes    Alcohol/week: 1.8 oz    Types: 3 Shots of liquor per week    Comment:  3 ounces daily  . Drug use: No  . Sexual activity: Not on file  Other Topics Concern  . Not on file  Social History Narrative   Lives at home with wife, Faith   Caffeine use: 2 cups coffee per day     Review of Systems: General: negative for chills, fever, night sweats or weight changes.  Cardiovascular: negative for chest pain, dyspnea on exertion, edema, orthopnea, palpitations, paroxysmal nocturnal dyspnea or shortness of breath Dermatological: negative for rash Respiratory: negative for cough or wheezing Urologic: negative for hematuria Abdominal: negative for nausea, vomiting, diarrhea, bright red blood per rectum, melena, or hematemesis Neurologic: negative for visual changes, syncope, or dizziness All other systems reviewed and are otherwise negative except as noted above.    Blood pressure 110/72, pulse 77, height 5\' 11"  (1.803 m), weight 247 lb (112 kg).  General appearance: alert and no distress Neck: no adenopathy, no carotid bruit, no JVD, supple, symmetrical, trachea midline and thyroid not enlarged, symmetric, no tenderness/mass/nodules Lungs: clear to auscultation bilaterally Heart: regular rate and rhythm, S1, S2 normal, no murmur, click, rub or gallop Extremities: enous stasis changes  bilaterally Pulses: 2+ and symmetric Skin: venous stasis changes bilaterally Neurologic: Alert and oriented X 3, normal strength and tone. Normal symmetric reflexes. Normal coordination and gait  EKG ventricular paced rhythm at 77. Personally reviewed this EKG  ASSESSMENT AND PLAN:   Permanent atrial fibrillation ([place]) History of permanent A. Fib on Coumadin anticoagulation status post permanent transvenous pacemaker insertion by Dr. Tami Price followed by Dr. Sallyanne Price.  CAD (coronary artery disease) of artery bypass graft History of CAD status post coronary artery bypass grafting in 2001. His last Myoview performed 05/25/14 was nonischemic. He denies chest pain or shortness of breath.  HTN (hypertension) History of essential hypertension blood pressure measures A1 10/72. He is on losartan and metoprolol. Continue current meds at current dosing.  Hyperlipidemia History of hyperlipidemia on statin therapy followed by his PCP  Pacemaker History of permanent transvenous pacemaker insertion for complete heart block followed by Dr. Sallyanne Price. He did have a generator change last year.      Mike Harp MD FACP,FACC,FAHA, [facility] 12/22/2017 2:33 PM

## 2017-12-22 NOTE — Assessment & Plan Note (Signed)
History of CAD status post coronary artery bypass grafting in 2001. His last Myoview performed 05/25/14 was nonischemic. He denies chest pain or shortness of breath.

## 2017-12-22 NOTE — Patient Instructions (Signed)

## 2017-12-23 ENCOUNTER — Telehealth: Payer: Self-pay | Admitting: Cardiology

## 2017-12-23 ENCOUNTER — Ambulatory Visit (INDEPENDENT_AMBULATORY_CARE_PROVIDER_SITE_OTHER): Payer: Medicare Other | Admitting: *Deleted

## 2017-12-23 DIAGNOSIS — F1010 Alcohol abuse, uncomplicated: Secondary | ICD-10-CM | POA: Insufficient documentation

## 2017-12-23 DIAGNOSIS — I442 Atrioventricular block, complete: Secondary | ICD-10-CM | POA: Diagnosis not present

## 2017-12-23 DIAGNOSIS — R251 Tremor, unspecified: Secondary | ICD-10-CM | POA: Insufficient documentation

## 2017-12-23 DIAGNOSIS — G629 Polyneuropathy, unspecified: Secondary | ICD-10-CM | POA: Insufficient documentation

## 2017-12-23 NOTE — Telephone Encounter (Signed)
LMOVM reminding pt to send remote transmission.   

## 2017-12-24 ENCOUNTER — Encounter: Payer: Self-pay | Admitting: Cardiology

## 2017-12-24 NOTE — Progress Notes (Signed)
Remote pacemaker transmission.   

## 2017-12-30 ENCOUNTER — Encounter: Payer: Self-pay | Admitting: Cardiovascular Disease

## 2017-12-30 ENCOUNTER — Ambulatory Visit (INDEPENDENT_AMBULATORY_CARE_PROVIDER_SITE_OTHER): Payer: Medicare Other | Admitting: Cardiovascular Disease

## 2017-12-30 VITALS — BP 118/62 | HR 70 | Ht 71.0 in | Wt 251.0 lb

## 2017-12-30 DIAGNOSIS — I4729 Other ventricular tachycardia: Secondary | ICD-10-CM

## 2017-12-30 DIAGNOSIS — I25810 Atherosclerosis of coronary artery bypass graft(s) without angina pectoris: Secondary | ICD-10-CM

## 2017-12-30 DIAGNOSIS — I482 Chronic atrial fibrillation: Secondary | ICD-10-CM | POA: Diagnosis not present

## 2017-12-30 DIAGNOSIS — I442 Atrioventricular block, complete: Secondary | ICD-10-CM | POA: Diagnosis not present

## 2017-12-30 DIAGNOSIS — I472 Ventricular tachycardia: Secondary | ICD-10-CM | POA: Diagnosis not present

## 2017-12-30 DIAGNOSIS — I25700 Atherosclerosis of coronary artery bypass graft(s), unspecified, with unstable angina pectoris: Secondary | ICD-10-CM

## 2017-12-30 DIAGNOSIS — Z950 Presence of cardiac pacemaker: Secondary | ICD-10-CM | POA: Diagnosis not present

## 2017-12-30 DIAGNOSIS — I4821 Permanent atrial fibrillation: Secondary | ICD-10-CM

## 2017-12-30 DIAGNOSIS — Z7901 Long term (current) use of anticoagulants: Secondary | ICD-10-CM | POA: Diagnosis not present

## 2017-12-30 NOTE — Progress Notes (Signed)
Patient ID: Mike Price, male   DOB: October 05, 1935, 83 y.o.   MRN: [id number]    Cardiology Office Note    Date:  01/01/2018   ID:  HAIK MAHONEY, DOB 1934-10-23, MRN [id number]  PCP:  [name], MD  Cardiologist:  Quay Burow, MD;  Sanda Klein, MD   Chief Complaint  Patient presents with  . Pacemaker Check    History of Present Illness:  Mike Price is a 83 y.o. male with CAD s/p CABG 2001 (patent grafts 2008, low risk nuclear study 2015), complete heart block s/p PPM (2008, generator change December 2016 - Medtronic Adapta), permanent atrial fibrillation and hyperlipidemia. He is anticoagulated with warfarin, without CVA/TIA or bleeding problems.  From a health point of view it has been an uneventful year since I last saw him.  He is mostly plagued by a variety of musculoskeletal problems.  He has had right total knee replacement and left hip replacement in the past and has been told that he needs a left knee replacement.  He is not sure that he wants to do it because of his age and limited activity status.  The patient specifically denies any chest pain at rest exertion, dyspnea at rest or with exertion, orthopnea, paroxysmal nocturnal dyspnea, syncope, palpitations, focal neurological deficits, intermittent claudication, lower extremity edema, unexplained weight gain, cough, hemoptysis or wheezing.  His PM is functioning normally, good lead parameters, longevity 13 years, 99% V paced, no episodes of high V rates.  heart rate histogram distribution appears appropriate for his activity level  Echo 2015 showed EF 60-65%, nuclear study 2015, fixed apical defect, EF 56%   Past Medical History:  Diagnosis Date  . Anemia   . Anxiety 01-11-13   tx. Clonazepam.  . Arthritis    Osteoarthritis-knees, fingers  . CAD (coronary artery disease)   . Diverticulitis   . Dysrhythmia 01-11-13    hx. A. Fib-Pacemaker implanted left chest  . Edema extremities 01-11-13   retains fluid in  legs-right greater than left.  [name] Kitchen GERD (gastroesophageal reflux disease)   . Hyperlipemia   . Hypertension   . Hypothyroidism   [name] 07/10/2015   IgA 542 mg% 06/19/15. IgA lambda paraprotein on IFE, normal IgG & IgM  . Neuromuscular disorder (HCC)    neuropathy in feet  . Obstructive sleep apnea    on C Pap  . Pacemaker 12/15/06   3'08  . Thyroid disease   . TMJ tenderness 01-11-13   right side-has issues from time to time.    Past Surgical History:  Procedure Laterality Date  . APPENDECTOMY    . CARDIAC CATHETERIZATION  03/28/08   patent grafts, nl EF  . CARPAL TUNNEL RELEASE Right 2018  . CERVICAL LAMINECTOMY    . CORONARY ARTERY BYPASS GRAFT  2001   4 vessels-'00  . EP IMPLANTABLE DEVICE N/A 09/11/2015   Procedure: PPM Generator Changeout;  Surgeon: Sanda Klein, MD;  Location: [facility];  Service: Cardiovascular;  Laterality: N/A;  . HERNIA REPAIR    . INSERT / REPLACE / REMOVE PACEMAKER     '08-inserted left chest  . JOINT REPLACEMENT     LTHA  . NM MYOCAR PERF WALL MOTION  02/12/2012   small fixed distal anteroapical/apical defect. No reversible ischemia  . PACEMAKER INSERTION  12/15/06   medtronic  . TONSILLECTOMY     child  . TOTAL KNEE ARTHROPLASTY Right 01/17/2013   Procedure: RIGHT TOTAL  KNEE ARTHROPLASTY;  Surgeon: Gearlean Alf, MD;  Location: [facility];  Service: Orthopedics;  Laterality: Right;  . US ECHOCARDIOGRAPHY  12/28/2007   LA mod. dilated,RA mildly dilated    Outpatient Medications Prior to Visit  Medication Sig Dispense Refill  . apixaban (ELIQUIS) 5 MG TABS tablet Take 1 tablet (5 mg total) by mouth 2 (two) times daily. 60 tablet 6  . atorvastatin (LIPITOR) 40 MG tablet Take 40 mg by mouth every evening.     . clonazePAM (KLONOPIN) 0.5 MG tablet Take 0.25-0.5 mg by mouth 3 (three) times daily as needed for anxiety. Takes 1/2 tablet twice daily then a whole tablet at bedtime    . doxazosin  (CARDURA) 2 MG tablet Take 1 mg by mouth at bedtime.    [name] Kitchen ezetimibe (ZETIA) 10 MG tablet Take 10 mg by mouth every evening.     . ferrous sulfate 325 (65 FE) MG tablet Take 325 mg by mouth every other day. On Monday, Wednesday and Friday    . furosemide (LASIX) 20 MG tablet Take 20 mg by mouth daily.     [name] Kitchen gabapentin (NEURONTIN) 100 MG capsule Take 1 capsule (100 mg total) by mouth 3 (three) times daily. Please keep follow-up appt for further refills. 180 capsule 0  . Liniments (SALONPAS PAIN RELIEF PATCH EX) Apply 0.4 % topically as needed.    [name] Kitchen losartan (COZAAR) 100 MG tablet Take 100 mg by mouth daily.    . metoprolol (LOPRESSOR) 100 MG tablet Take 100 mg by mouth 2 (two) times daily.    . NON FORMULARY CPAP qhs    . omeprazole (PRILOSEC) 20 MG capsule Take 20 mg by mouth 2 (two) times daily.    . Skin Protectants, Misc. (EUCERIN) cream Apply 1 application topically daily as needed for dry skin.    [name] Kitchen SYNTHROID 88 MCG tablet Take 88 mcg by mouth daily.    . traMADol (ULTRAM) 50 MG tablet Take 50 mg by mouth as needed.    [name] Kitchen acetaminophen (TYLENOL) 500 MG tablet Take 1,000 mg by mouth every 6 (six) hours as needed.    [name] Kitchen amoxicillin-clavulanate (AUGMENTIN) 875-125 MG tablet Take 1 tablet by mouth every 12 (twelve) hours. (Patient not taking: Reported on 12/30/2017) 14 tablet 0  . bisacodyl (DULCOLAX) 10 MG suppository Place 1 suppository (10 mg total) rectally daily as needed. (Patient not taking: Reported on 12/30/2017) 12 suppository 0  . Dextromethorphan-Guaifenesin (TUSSIN DM PO) Take by mouth as needed.     [name] Kitchen HYDROcodone-homatropine (HYDROMET) 5-1.5 MG/5ML syrup Take 5 mLs by mouth every 6 (six) hours as needed for cough. (Patient not taking: Reported on 12/30/2017) 120 mL 0  . levofloxacin (LEVAQUIN) 500 MG tablet Take 500 mg by mouth daily.    . Loratadine (CLARITIN PO) Take 1 tablet by mouth as needed.     . warfarin (COUMADIN) 5 MG tablet Take 1/2 to 1 tablet by mouth daily as directed by  coumadin clinic (Patient not taking: Reported on 12/22/2017) 90 tablet 1   No facility-administered medications prior to visit.      Allergies:   Lisinopril and Mefloquine   Social History   Socioeconomic History  . Marital status: Married    Spouse name: Faith  . Number of children: Not on file  . Years of education: Not on file  . Highest education level: Not on file  Occupational History  . Occupation: [employer]  . Financial resource strain: Not on file  .  Food insecurity:    Worry: Not on file    Inability: Not on file  . Transportation needs:    Medical: Not on file    Non-medical: Not on file  Tobacco Use  . Smoking status: Former Smoker    Last attempt to quit: 01/12/1984    Years since quitting: 33.9  . Smokeless tobacco: Never Used  Substance and Sexual Activity  . Alcohol use: Yes    Alcohol/week: 1.8 oz    Types: 3 Shots of liquor per week    Comment: 2-3 drinks daily  . Drug use: No  . Sexual activity: Not on file  Lifestyle  . Physical activity:    Days per week: Not on file    Minutes per session: Not on file  . Stress: Not on file  Relationships  . Social connections:    Talks on phone: Not on file    Gets together: Not on file    Attends religious service: Not on file    Active member of club or organization: Not on file    Attends meetings of clubs or organizations: Not on file    Relationship status: Not on file  Other Topics Concern  . Not on file  Social History Narrative   Lives at home with wife, Faith   Caffeine use: 2 cups coffee per day     ROS:   Please see the history of present illness.    ROS All other systems reviewed and are negative.   PHYSICAL EXAM:   VS:  BP 118/62   Pulse 70   Ht 5\' 11"  (1.803 m)   Wt 251 lb (113.9 kg)   SpO2 97%   BMI 35.01 kg/m     General: Alert, oriented x3, no distress, directly obese Head: no evidence of trauma, PERRL, EOMI, no exophtalmos or lid lag, no myxedema, no  xanthelasma; normal ears, nose and oropharynx Neck: normal jugular venous pulsations and no hepatojugular reflux; brisk carotid pulses without delay and no carotid bruits Chest: clear to auscultation, no signs of consolidation by percussion or palpation, normal fremitus, symmetrical and full respiratory excursions.  Pacemaker site left subclavian area is healthy Cardiovascular: normal position and quality of the apical impulse, regular rhythm, normal first and paradoxically split second heart sounds, no murmurs, rubs or gallops Abdomen: no tenderness or distention, no masses by palpation, no abnormal pulsatility or arterial bruits, normal bowel sounds, no hepatosplenomegaly Extremities: no clubbing, cyanosis or edema; 2+ radial, ulnar and brachial pulses bilaterally; 2+ right femoral, posterior tibial and dorsalis pedis pulses; 2+ left femoral, posterior tibial and dorsalis pedis pulses; no subclavian or femoral bruits Neurological: grossly nonfocal Psych: Normal mood and affect   Wt Readings from Last 3 Encounters:  12/30/17 251 lb (113.9 kg)  12/22/17 247 lb (112 kg)  12/22/17 247 lb (112 kg)      Studies/Labs Reviewed:   EKG:  EKG is not ordered today.  Recent Labs: 08/12/2017: Hemoglobin 13.4; Platelets 176 12/30/2017: BUN 34; Creatinine, Ser 1.30; Potassium 4.7; Sodium 140     ASSESSMENT:    1. Permanent atrial fibrillation ([facility])   2. NSVT (nonsustained ventricular tachycardia) ([facility])   3. CHB (complete heart block) (HCC)   4. Pacemaker   5. Coronary artery disease involving coronary bypass graft of native heart without angina pectoris   6. Long term current use of anticoagulant      PLAN:  In order of problems listed above:  1. AFib:  No high V  rates. CHADSVasc 4 (age 75, CAD, HTN).  On Eliquis. 2. NSVT: None recorded since his last device check 3. CHB: Occasional native AV conduction, but has 100% V pacing and is best considered pacemaker dependent. 4. PPM: Normal device  function; Carelink downloads Q 3 mos, office visit yearly. 5. CAD s/p CABG: no angina, external status limited by orthopedic problems 6. Anticoagulation: no falls or bleeding problems.    Medication Adjustments/Labs and Tests Ordered: Current medicines are reviewed at length with the patient today.  Concerns regarding medicines are outlined above.  Medication changes, Labs and Tests ordered today are listed in the Patient Instructions below. Patient Instructions  Dr Sallyanne Kuster recommends that you continue on your current medications as directed. Please refer to the Current Medication list given to you today.  Remote monitoring is used to monitor your Pacemaker or ICD from home. This monitoring reduces the number of office visits required to check your device to one time per year. It allows [place] to keep an eye on the functioning of your device to ensure it is working properly. You are scheduled for a device check from home on Wednesday, June 19th, 2019. You may send your transmission at any time that day. If you have a wireless device, the transmission will be sent automatically. After your physician reviews your transmission, you will receive a notification with your next transmission date.  Dr Sallyanne Kuster recommends that you schedule a follow-up appointment in 12 months with a pacemaker check. You will receive a reminder letter in the mail two months in advance. If you don't receive a letter, please call our office to schedule the follow-up appointment.  If you need a refill on your cardiac medications before your next appointment, please call your pharmacy.      Signed, Sanda Klein, MD  01/01/2018 11:18 AM    [address], [place], [place]  [address] Phone: [phone]; Fax: [phone]

## 2017-12-30 NOTE — Patient Instructions (Signed)

## 2017-12-31 LAB — BASIC METABOLIC PANEL
BUN / CREAT RATIO: 26 — AB (ref 10–24)
BUN: 34 mg/dL — ABNORMAL HIGH (ref 8–27)
CHLORIDE: 96 mmol/L (ref 96–106)
CO2: 28 mmol/L (ref 20–29)
Calcium: 9.6 mg/dL (ref 8.6–10.2)
Creatinine, Ser: 1.3 mg/dL — ABNORMAL HIGH (ref 0.76–1.27)
GFR, EST AFRICAN AMERICAN: 59 mL/min/{1.73_m2} — AB (ref >59)
GFR, EST NON AFRICAN AMERICAN: 51 mL/min/{1.73_m2} — AB (ref >59)
Glucose: 104 mg/dL — ABNORMAL HIGH (ref 65–99)
Potassium: 4.7 mmol/L (ref 3.5–5.2)
SODIUM: 140 mmol/L (ref 134–144)

## 2018-01-01 ENCOUNTER — Encounter: Payer: Self-pay | Admitting: Cardiovascular Disease

## 2018-01-11 ENCOUNTER — Other Ambulatory Visit: Payer: Self-pay | Admitting: Pharmacist Clinician (PhC)/ Clinical Pharmacy Specialist

## 2018-01-11 MED ORDER — APIXABAN 5 MG PO TABS: 5.0000 mg | ORAL_TABLET | Freq: Two times a day (BID) | ORAL | 1 refills | Status: DC

## 2018-01-14 ENCOUNTER — Telehealth: Payer: Self-pay

## 2018-01-14 DIAGNOSIS — I10 Essential (primary) hypertension: Secondary | ICD-10-CM

## 2018-01-14 DIAGNOSIS — I25810 Atherosclerosis of coronary artery bypass graft(s) without angina pectoris: Secondary | ICD-10-CM

## 2018-01-14 DIAGNOSIS — I442 Atrioventricular block, complete: Secondary | ICD-10-CM

## 2018-01-14 NOTE — Telephone Encounter (Signed)
Notes recorded by Diana Eves, CMA on 01/08/2018 at 4:02 PM EDT Detailed letter and repeat lab slips mailed to patient. ------  Notes recorded by Correll Denbow, Dionne Bucy, CMA on 01/08/2018 at 3:57 PM EDT lmtcb ------  Notes recorded by Sanda Klein, MD on 12/31/2017 at 8:31 AM EDT Seems to be "dry". I would advise cutting the furosemide back to 3-4 days a week instead of daily and recheck a BMET in a months, please.

## 2018-01-15 LAB — CUP PACEART REMOTE DEVICE CHECK
Battery Impedance: 112 Ohm
Brady Statistic RV Percent Paced: 99 %
Implantable Lead Implant Date: 20080311
Implantable Lead Location: 753859
Implantable Lead Model: 5076
Implantable Pulse Generator Implant Date: 20161206
Lead Channel Impedance Value: 67 Ohm
Lead Channel Impedance Value: 720 Ohm
Lead Channel Pacing Threshold Amplitude: 1.125 V
Lead Channel Pacing Threshold Pulse Width: 0.4 ms
Lead Channel Setting Pacing Amplitude: 2.25 V
Lead Channel Setting Sensing Sensitivity: 2.8 mV
MDC IDC LEAD IMPLANT DT: 20080311
MDC IDC LEAD LOCATION: 753860
MDC IDC MSMT BATTERY REMAINING LONGEVITY: 161 mo
MDC IDC MSMT BATTERY VOLTAGE: 2.78 V
MDC IDC SESS DTM: 20190320163741
MDC IDC SET LEADCHNL RV PACING PULSEWIDTH: 0.4 ms

## 2018-01-18 ENCOUNTER — Other Ambulatory Visit: Payer: Self-pay | Admitting: Neurology

## 2018-01-19 ENCOUNTER — Telehealth: Payer: Self-pay | Admitting: Pharmacist Clinician (PhC)/ Clinical Pharmacy Specialist

## 2018-01-19 NOTE — Telephone Encounter (Signed)
Patient called to report shingles - now on valcyclovir and prednisone.  Assured him that these are fine with his Eliquis

## 2018-03-17 ENCOUNTER — Encounter: Payer: Self-pay | Admitting: Neurology

## 2018-03-24 ENCOUNTER — Ambulatory Visit (INDEPENDENT_AMBULATORY_CARE_PROVIDER_SITE_OTHER): Payer: Medicare Other | Admitting: *Deleted

## 2018-03-24 DIAGNOSIS — I442 Atrioventricular block, complete: Secondary | ICD-10-CM

## 2018-03-24 DIAGNOSIS — I4891 Unspecified atrial fibrillation: Secondary | ICD-10-CM

## 2018-03-24 NOTE — Progress Notes (Signed)
Remote pacemaker transmission.   

## 2018-03-26 LAB — CUP PACEART REMOTE DEVICE CHECK
Battery Impedance: 160 Ohm
Brady Statistic RV Percent Paced: 99 %
Implantable Lead Implant Date: 20080311
Implantable Lead Location: 753859
Lead Channel Impedance Value: 67 Ohm
Lead Channel Impedance Value: 682 Ohm
Lead Channel Setting Sensing Sensitivity: 2.8 mV
MDC IDC LEAD IMPLANT DT: 20080311
MDC IDC LEAD LOCATION: 753860
MDC IDC MSMT BATTERY REMAINING LONGEVITY: 149 mo
MDC IDC MSMT BATTERY VOLTAGE: 2.79 V
MDC IDC MSMT LEADCHNL RV PACING THRESHOLD AMPLITUDE: 1 V
MDC IDC MSMT LEADCHNL RV PACING THRESHOLD PULSEWIDTH: 0.4 ms
MDC IDC PG IMPLANT DT: 20161206
MDC IDC SESS DTM: 20190619124516
MDC IDC SET LEADCHNL RV PACING AMPLITUDE: 2 V
MDC IDC SET LEADCHNL RV PACING PULSEWIDTH: 0.4 ms

## 2018-04-05 ENCOUNTER — Other Ambulatory Visit: Payer: Self-pay | Admitting: Neurology

## 2018-04-09 LAB — CUP PACEART INCLINIC DEVICE CHECK
Implantable Lead Implant Date: 20080311
Implantable Lead Location: 753859
Implantable Lead Model: 5076
Implantable Pulse Generator Implant Date: 20161206
MDC IDC LEAD IMPLANT DT: 20080311
MDC IDC LEAD LOCATION: 753860
MDC IDC SESS DTM: 20190705143507
MDC IDC SET LEADCHNL RV PACING AMPLITUDE: 2.25 V
MDC IDC SET LEADCHNL RV PACING PULSEWIDTH: 0.4 ms
MDC IDC SET LEADCHNL RV SENSING SENSITIVITY: 2.8 mV

## 2018-06-01 ENCOUNTER — Telehealth: Payer: Self-pay | Admitting: Pharmacist Clinician (PhC)/ Clinical Pharmacy Specialist

## 2018-06-01 NOTE — Telephone Encounter (Signed)
Patient banged left forearm on bathroom cabinet yesterday.  Has bruised much of forearm with small goose-egg.  No pain, fingers and wrist movement all normal.    Advised he can use ice to decrease swelling for first 24-48 hours, then just monitor.  Can use heat to help decrease bruise after 48 hrs.  Also he should contact MD should he notice pain or inability to move joints.  Patient voiced understanding.

## 2018-06-23 ENCOUNTER — Ambulatory Visit (INDEPENDENT_AMBULATORY_CARE_PROVIDER_SITE_OTHER): Payer: Medicare Other | Admitting: *Deleted

## 2018-06-23 DIAGNOSIS — I442 Atrioventricular block, complete: Secondary | ICD-10-CM | POA: Diagnosis not present

## 2018-06-24 NOTE — Progress Notes (Signed)
Remote pacemaker transmission.   

## 2018-07-12 ENCOUNTER — Other Ambulatory Visit: Payer: Self-pay | Admitting: Cardiovascular Disease

## 2018-07-14 ENCOUNTER — Other Ambulatory Visit: Payer: Self-pay | Admitting: Neurology

## 2018-07-15 IMAGING — DX DG CHEST 2V
2 series · 2 of 2 positions shown · non-contrast
Comparison: 01/19/2015

CLINICAL DATA: Patient states that he started having cough x 3 days
ago, chest congestion, fever, sob. Hx of heart surgery,pacemaker.
Non-smoker since 9299.

EXAM:
CHEST  2 VIEW

[chest lat]
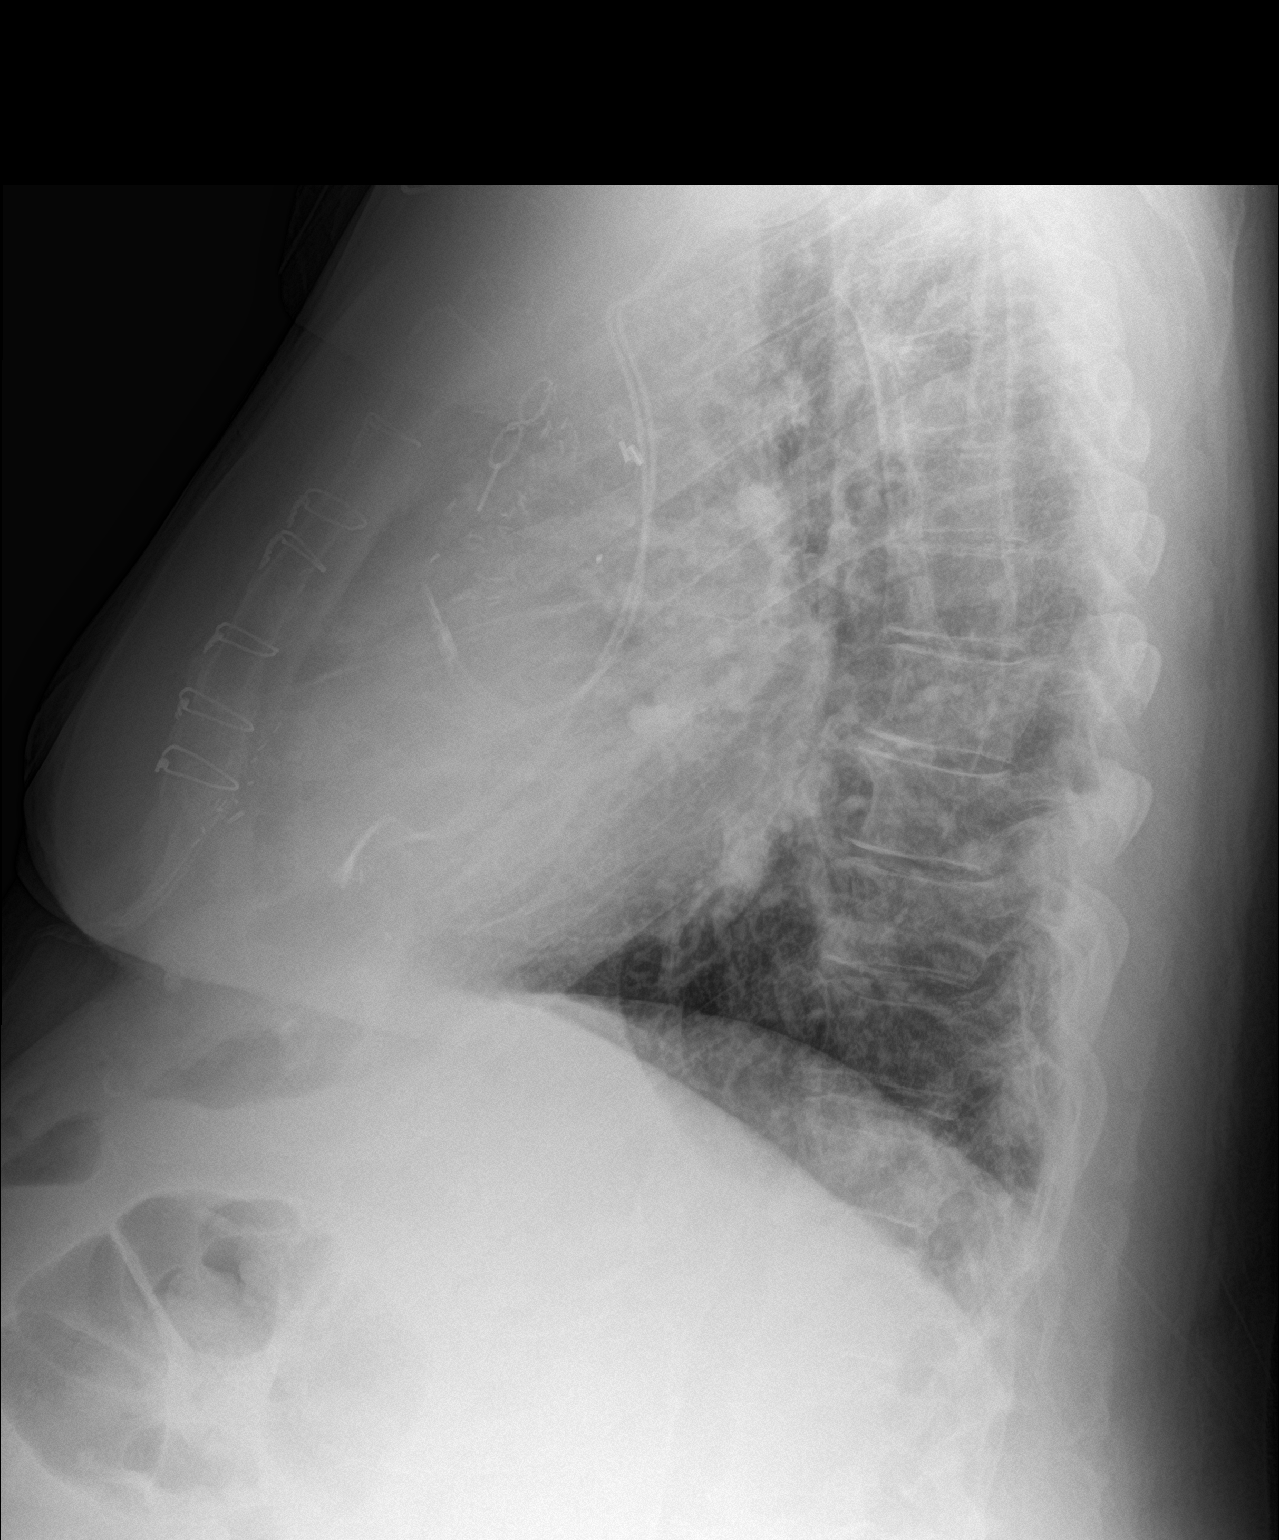

[chest pa]
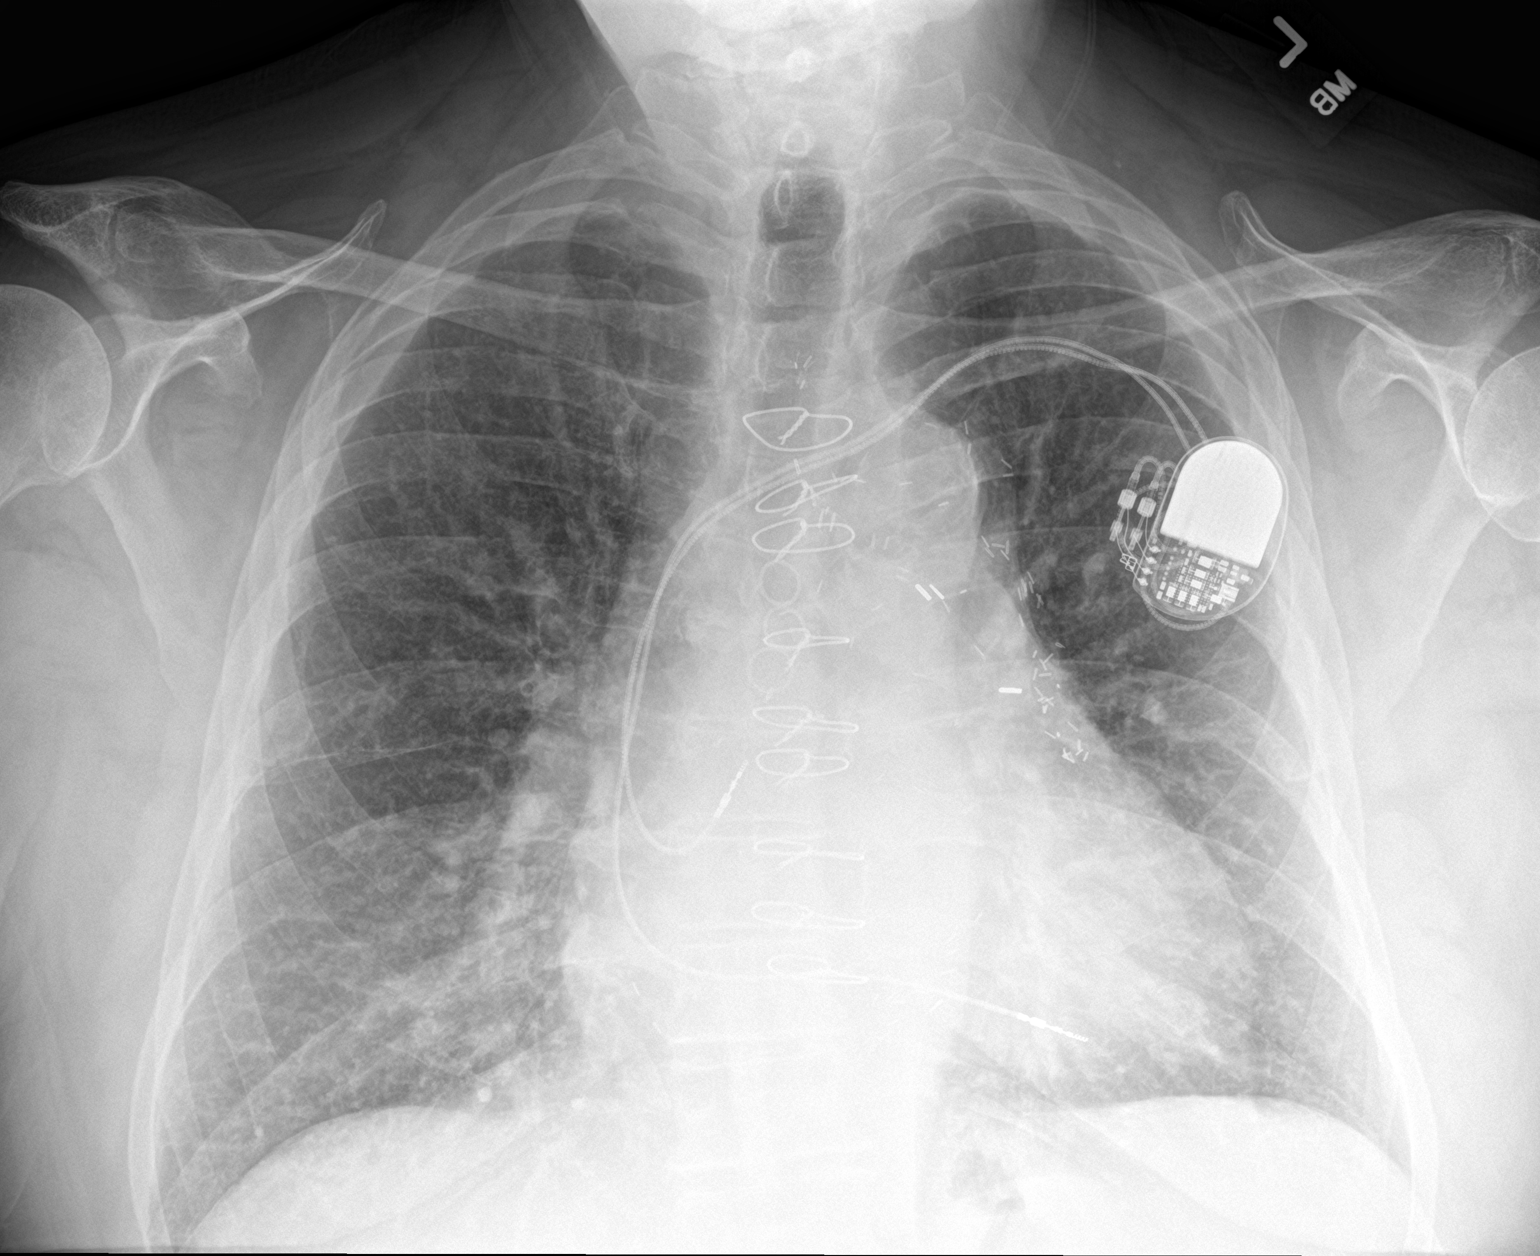

[2 of 2 positions shown; findings below may reference images not displayed]

FINDINGS: There are stable changes from previous CABG surgery. The cardiac
silhouette is mildly enlarged. No mediastinal or hilar masses. No
convincing adenopathy.

Lungs demonstrate mild interstitial thickening most evident in the
peripheral right lung base. No focal lung consolidation. Lungs are
mildly hyperexpanded. No pleural effusion. No pneumothorax.

Left anterior chest wall sequential pacemaker is stable in well
positioned.

Skeletal structures are demineralized but intact.
IMPRESSION: 1. Mild interstitial thickening, mostly at the right lung base,
which appears more prominent than on recent prior studies. Consider
interstitial infection given the patient's provided history.
Findings could reflect mild interstitial edema.
2. No evidence of lobar pneumonia.
3. Stable changes from previous CABG surgery. Mild cardiomegaly,
also stable.

## 2018-07-16 LAB — CUP PACEART REMOTE DEVICE CHECK
Battery Impedance: 160 Ohm
Battery Remaining Longevity: 149 mo
Battery Voltage: 2.79 V
Brady Statistic RV Percent Paced: 99 %
Implantable Lead Implant Date: 20080311
Implantable Lead Location: 753860
Implantable Lead Model: 5076
Lead Channel Setting Pacing Pulse Width: 0.4 ms
Lead Channel Setting Sensing Sensitivity: 2.8 mV
MDC IDC LEAD IMPLANT DT: 20080311
MDC IDC LEAD LOCATION: 753859
MDC IDC MSMT LEADCHNL RA IMPEDANCE VALUE: 67 Ohm
MDC IDC MSMT LEADCHNL RV IMPEDANCE VALUE: 666 Ohm
MDC IDC MSMT LEADCHNL RV PACING THRESHOLD AMPLITUDE: 0.75 V
MDC IDC MSMT LEADCHNL RV PACING THRESHOLD PULSEWIDTH: 0.4 ms
MDC IDC PG IMPLANT DT: 20161206
MDC IDC SESS DTM: 20190918152528
MDC IDC SET LEADCHNL RV PACING AMPLITUDE: 2 V

## 2018-08-31 ENCOUNTER — Other Ambulatory Visit: Payer: Self-pay | Admitting: Oncology

## 2018-08-31 DIAGNOSIS — D472 Monoclonal gammopathy: Secondary | ICD-10-CM

## 2018-09-14 ENCOUNTER — Other Ambulatory Visit (INDEPENDENT_AMBULATORY_CARE_PROVIDER_SITE_OTHER): Payer: Medicare Other

## 2018-09-14 DIAGNOSIS — D472 Monoclonal gammopathy: Secondary | ICD-10-CM

## 2018-09-17 LAB — CBC WITH DIFFERENTIAL/PLATELET
Basophils Absolute: 0 10*3/uL (ref 0.0–0.2)
Basos: 0 %
EOS (ABSOLUTE): 0.2 10*3/uL (ref 0.0–0.4)
Eos: 4 %
HEMATOCRIT: 39.6 % (ref 37.5–51.0)
HEMOGLOBIN: 13.4 g/dL (ref 13.0–17.7)
IMMATURE GRANS (ABS): 0 10*3/uL (ref 0.0–0.1)
Immature Granulocytes: 0 %
Lymphocytes Absolute: 1.5 10*3/uL (ref 0.7–3.1)
Lymphs: 23 %
MCH: 36.2 pg — ABNORMAL HIGH (ref 26.6–33.0)
MCHC: 33.8 g/dL (ref 31.5–35.7)
MCV: 107 fL — ABNORMAL HIGH (ref 79–97)
MONOCYTES: 11 %
MONOS ABS: 0.7 10*3/uL (ref 0.1–0.9)
NEUTROS ABS: 4.1 10*3/uL (ref 1.4–7.0)
Neutrophils: 62 %
Platelets: 128 10*3/uL — ABNORMAL LOW (ref 150–450)
RBC: 3.7 x10E6/uL — ABNORMAL LOW (ref 4.14–5.80)
RDW: 12.8 % (ref 12.3–15.4)
WBC: 6.6 10*3/uL (ref 3.4–10.8)

## 2018-09-17 LAB — COMPREHENSIVE METABOLIC PANEL
ALT: 15 IU/L (ref 0–44)
AST: 20 IU/L (ref 0–40)
Albumin/Globulin Ratio: 1.8 (ref 1.2–2.2)
Albumin: 4.2 g/dL (ref 3.5–4.7)
Alkaline Phosphatase: 95 IU/L (ref 39–117)
BILIRUBIN TOTAL: 1.6 mg/dL — AB (ref 0.0–1.2)
BUN/Creatinine Ratio: 28 — ABNORMAL HIGH (ref 10–24)
BUN: 30 mg/dL — ABNORMAL HIGH (ref 8–27)
CO2: 23 mmol/L (ref 20–29)
CREATININE: 1.08 mg/dL (ref 0.76–1.27)
Calcium: 9.2 mg/dL (ref 8.6–10.2)
Chloride: 100 mmol/L (ref 96–106)
GFR, EST AFRICAN AMERICAN: 74 mL/min/{1.73_m2} (ref >59)
GFR, EST NON AFRICAN AMERICAN: 64 mL/min/{1.73_m2} (ref >59)
GLOBULIN, TOTAL: 2.4 g/dL (ref 1.5–4.5)
GLUCOSE: 97 mg/dL (ref 65–99)
Potassium: 4.6 mmol/L (ref 3.5–5.2)
Sodium: 140 mmol/L (ref 134–144)

## 2018-09-17 LAB — LACTATE DEHYDROGENASE: LDH: 181 IU/L (ref 121–224)

## 2018-09-17 LAB — IMMUNOFIXATION ELECTROPHORESIS
IGG (IMMUNOGLOBIN G), SERUM: 1004 mg/dL (ref 700–1600)
IGM (IMMUNOGLOBULIN M), SRM: 28 mg/dL (ref 15–143)
IgA/Immunoglobulin A, Serum: 596 mg/dL — ABNORMAL HIGH (ref 61–437)
Total Protein: 6.6 g/dL (ref 6.0–8.5)

## 2018-09-17 IMAGING — DX DG CHEST 2V
2 series · 2 of 2 positions shown · non-contrast
Comparison: Radiographs November 03, 2016.

CLINICAL DATA: Cough, shortness of breath.

EXAM:
CHEST  2 VIEW

[dg chest 2 view (1 of 2)]
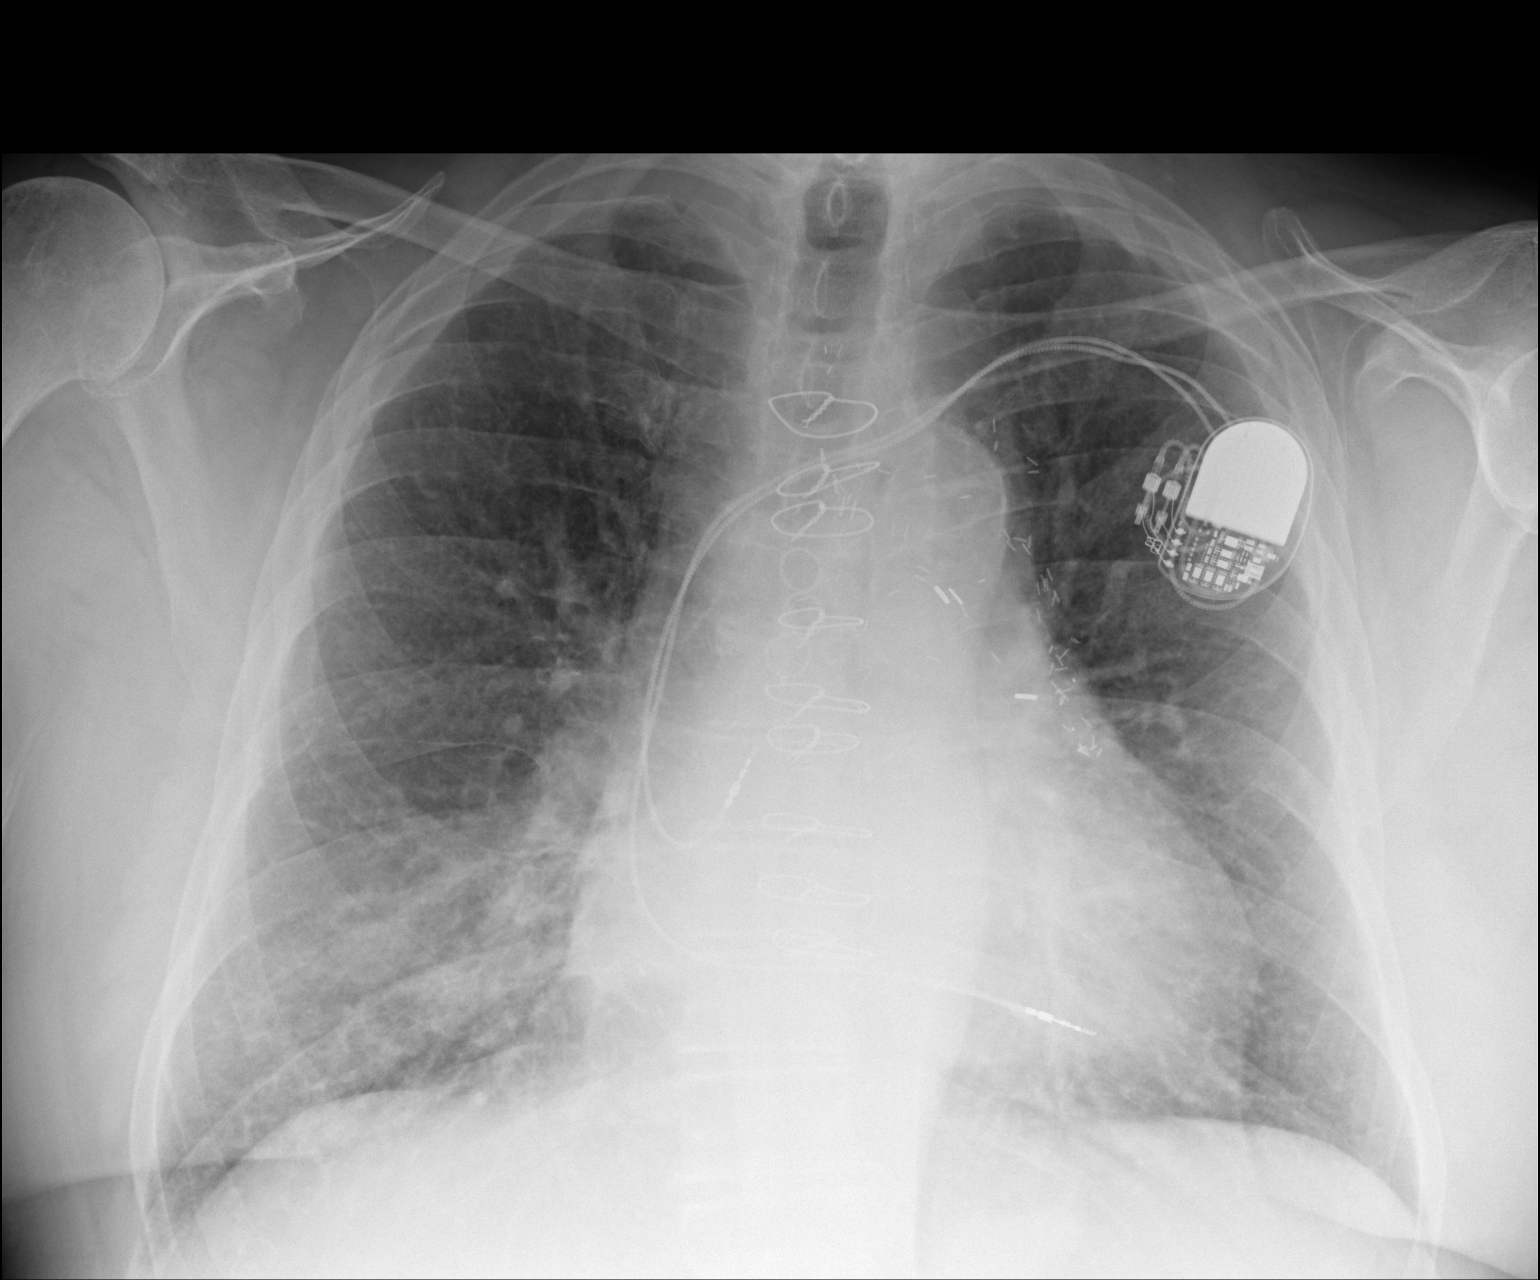

[dg chest 2 view (2 of 2)]
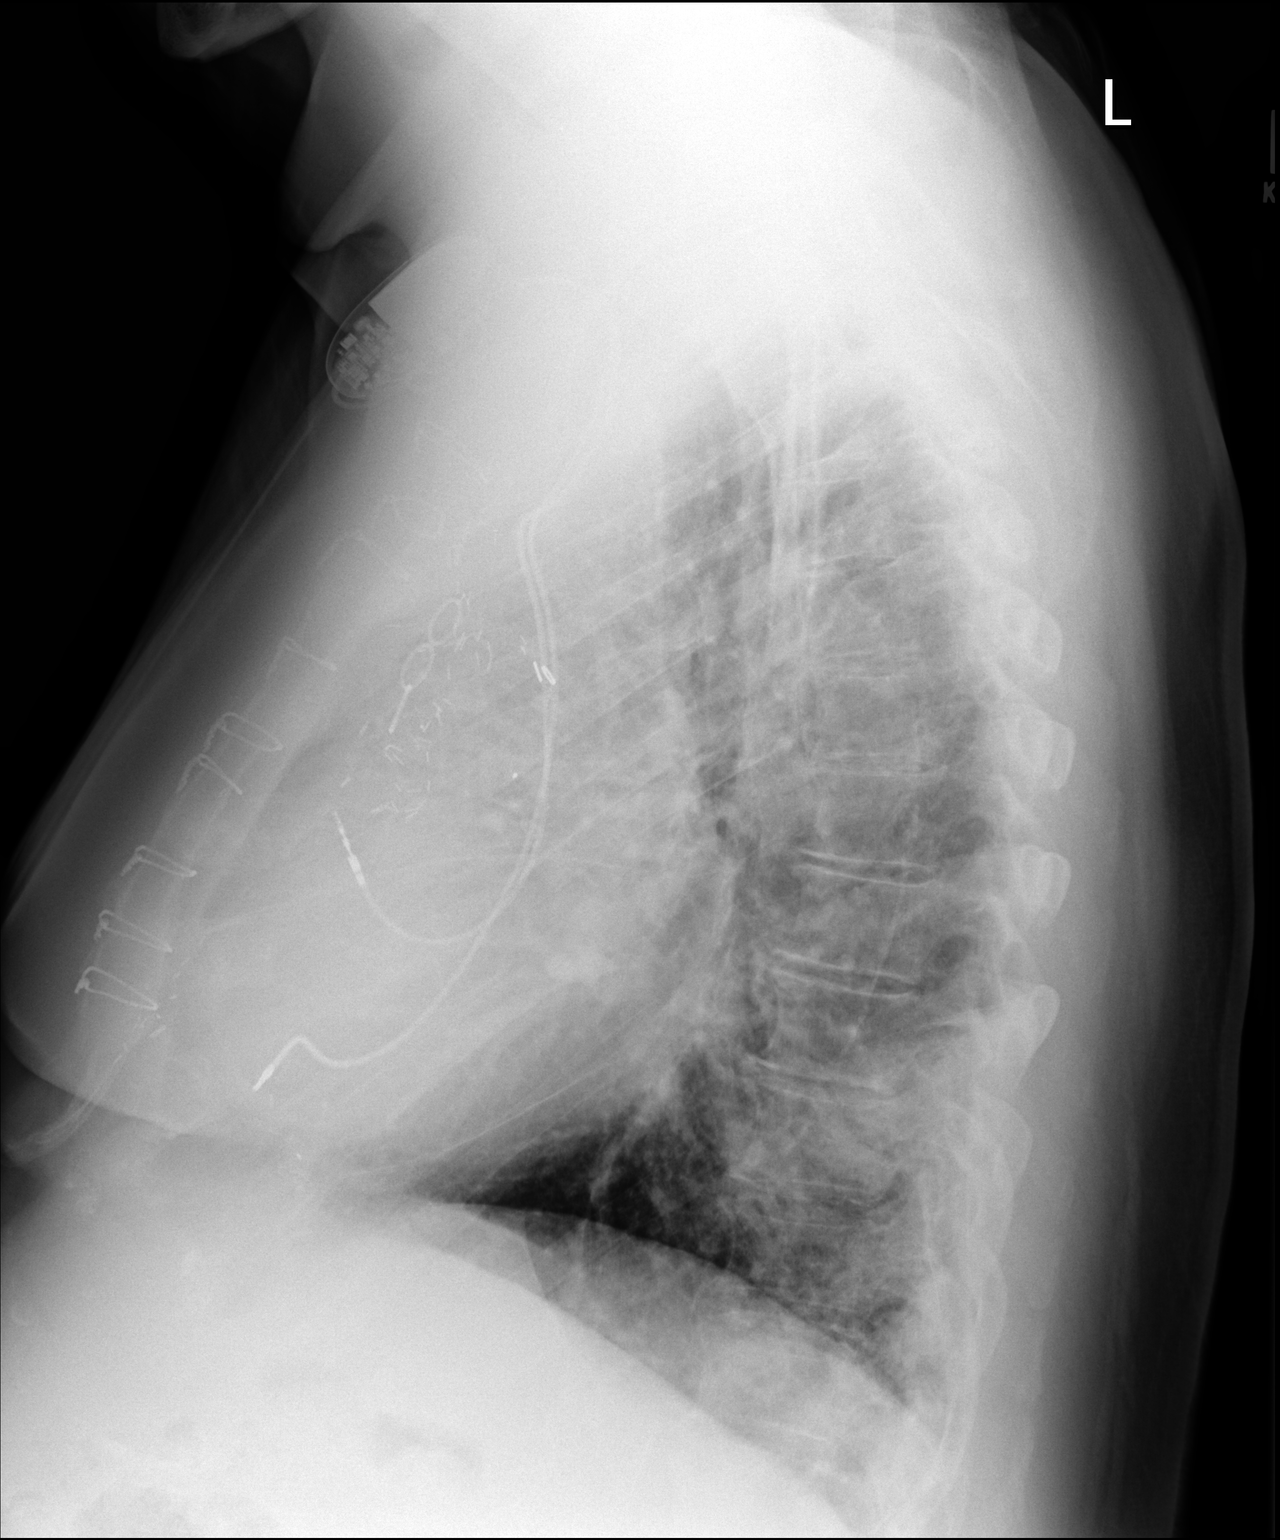

[2 of 2 positions shown; findings below may reference images not displayed]

FINDINGS: Stable cardiomegaly. Status post coronary artery bypass graft.
Left-sided pacemaker is unchanged in position. No pneumothorax or
pleural effusion is noted. Atherosclerosis thoracic aorta is noted.
Stable right basilar density is noted concerning for pneumonia,
edema or scarring. Bony thorax is unremarkable.
IMPRESSION: Aortic atherosclerosis. Stable right basilar density is noted
concerning for pneumonia or possibly scarring. Followup PA and
lateral chest X-ray is recommended in 3-4 weeks following trial of
antibiotic therapy to ensure resolution and exclude underlying
malignancy.

## 2018-09-21 ENCOUNTER — Ambulatory Visit (INDEPENDENT_AMBULATORY_CARE_PROVIDER_SITE_OTHER): Payer: Medicare Other | Admitting: Oncology

## 2018-09-21 ENCOUNTER — Encounter: Payer: Self-pay | Admitting: Oncology

## 2018-09-21 ENCOUNTER — Other Ambulatory Visit: Payer: Self-pay

## 2018-09-21 VITALS — BP 143/65 | HR 70 | Temp 98.5°F | Ht 71.0 in | Wt 249.1 lb

## 2018-09-21 DIAGNOSIS — K5790 Diverticulosis of intestine, part unspecified, without perforation or abscess without bleeding: Secondary | ICD-10-CM

## 2018-09-21 DIAGNOSIS — Z7901 Long term (current) use of anticoagulants: Secondary | ICD-10-CM

## 2018-09-21 DIAGNOSIS — Z888 Allergy status to other drugs, medicaments and biological substances status: Secondary | ICD-10-CM

## 2018-09-21 DIAGNOSIS — D472 Monoclonal gammopathy: Secondary | ICD-10-CM

## 2018-09-21 DIAGNOSIS — I2510 Atherosclerotic heart disease of native coronary artery without angina pectoris: Secondary | ICD-10-CM

## 2018-09-21 DIAGNOSIS — Z79890 Hormone replacement therapy (postmenopausal): Secondary | ICD-10-CM | POA: Diagnosis not present

## 2018-09-21 DIAGNOSIS — K649 Unspecified hemorrhoids: Secondary | ICD-10-CM | POA: Diagnosis not present

## 2018-09-21 DIAGNOSIS — E785 Hyperlipidemia, unspecified: Secondary | ICD-10-CM | POA: Diagnosis not present

## 2018-09-21 DIAGNOSIS — Z951 Presence of aortocoronary bypass graft: Secondary | ICD-10-CM

## 2018-09-21 DIAGNOSIS — I10 Essential (primary) hypertension: Secondary | ICD-10-CM

## 2018-09-21 DIAGNOSIS — G608 Other hereditary and idiopathic neuropathies: Secondary | ICD-10-CM

## 2018-09-21 DIAGNOSIS — G5603 Carpal tunnel syndrome, bilateral upper limbs: Secondary | ICD-10-CM | POA: Diagnosis not present

## 2018-09-21 DIAGNOSIS — I495 Sick sinus syndrome: Secondary | ICD-10-CM | POA: Diagnosis not present

## 2018-09-21 DIAGNOSIS — Z87891 Personal history of nicotine dependence: Secondary | ICD-10-CM

## 2018-09-21 DIAGNOSIS — I482 Chronic atrial fibrillation, unspecified: Secondary | ICD-10-CM | POA: Diagnosis not present

## 2018-09-21 DIAGNOSIS — E039 Hypothyroidism, unspecified: Secondary | ICD-10-CM | POA: Diagnosis not present

## 2018-09-21 DIAGNOSIS — Z950 Presence of cardiac pacemaker: Secondary | ICD-10-CM | POA: Diagnosis not present

## 2018-09-21 NOTE — Patient Instructions (Signed)
We will transition tour care to one of the Hematologists at [facility]. If you don't get a call by the end of January, call [phone] to schedule.  It has been a pleasure working with you!

## 2018-09-21 NOTE — Progress Notes (Signed)
Hematology and Oncology Follow Up Visit  Mike Price [id number] October 18, 1934 83 y.o. 09/21/2018 2:03 PM   Principle Diagnosis: Encounter Diagnosis  Name Primary?  [name] Kitchen MGUS (monoclonal gammopathy of unknown significance) Yes  Clinical Summary: 83 year old retired Mike Price referred in November 2016 by neurology for further evaluation of progressive stocking and glove neuropathy.  As part of his neuro evaluation he was found to have a monoclonal IgA gammopathy.  Normal renal function.  No proteinuria.  Normal serum free light chain ratio.  24-hour urine total protein 120 mg with no monoclonal protein on immunofixation electrophoresis.  He was told he had bilateral carpal tunnel syndrome which brought up the possibility of amyloidosis.  He had right carpal tunnel surgery but no material was submitted to pathology despite my request.  He has been followed with serial lab since that time. IgA levels have been stable over time.  Neuropathy has fluctuated but not progressed.  Additional history includes: History of coronary artery disease status post bypass surgery approximately 2002.  He has a pacemaker.  Chronic atrial fibrillation.  He was on chronic anticoagulation with warfarin but subsequently changed to Eliquis.  His primary care physician is Dr Mike Price.    He has chronic diverticulitis and has 3-4 flareups per year.  None of these required hospitalization over the last year.  Interim History: Overall doing well.  Only complaint today is flareup of his hemorrhoids which are very uncomfortable.  He is using cortisone suppositories twice daily. Cardiac status has been stable.  He denies any chest pain or palpitations.  Recent pacemaker check. When questioned he states that his paresthesias fluctuate but overall unchanged.  They primarily affect his feet.  He is still driving.  He states that he can feel the gas pedal.  Medications: reviewed  Allergies:  Allergies  Allergen  Reactions  . Lisinopril Cough  . Mefloquine Other (See Comments)    "made me crazy" anxious, upset    Review of Systems: See interim history Remaining ROS negative:   Physical Exam: Blood pressure (!) 143/65, pulse 70, temperature 98.5 F (36.9 C), temperature source Oral, height 5\' 11"  (1.803 m), weight 249 lb 1.6 oz (113 kg), SpO2 95 %. Wt Readings from Last 3 Encounters:  09/21/18 249 lb 1.6 oz (113 kg)  12/30/17 251 lb (113.9 kg)  12/22/17 247 lb (112 kg)     General appearance: well nourished Caucasian man Mike Price: Pharynx no erythema, exudate, mass, or ulcer. No thyromegaly or thyroid nodules Lymph nodes: No cervical, supraclavicular, or axillary lymphadenopathy Breasts:  Lungs: Clear to auscultation, resonant to percussion throughout Heart: Regular rhythm, no murmur, no gallop, no rub, no click, no edema Abdomen: Soft, nontender, normal bowel sounds, no mass, no organomegaly Extremities: No edema, no calf tenderness Musculoskeletal: no joint deformities GU:  Vascular: Carotid pulses 2+, no bruits, distal pulses: Dorsalis pedis 1+ symmetric Neurologic: Alert, oriented, PERRLA,  cranial nerves grossly normal, motor strength 5 over 5, reflexes 1+ symmetric biceps, absent symmetric knees, upper body coordination normal, gait unsteady due to his weight and arthritis also complicated by distal neuropathy. Vibration sensation is normal over the fingertips bilaterally by tuning fork exam.  Feet not tested. Skin: No rash or ecchymosis  Lab Results: CBC W/Diff    Component Value Date/Time   WBC 6.6 09/14/2018 0941   WBC 7.3 09/04/2015 1059   RBC 3.70 (L) 09/14/2018 0941   RBC 4.08 (L) 09/04/2015 1059   HGB 13.4 09/14/2018 0941   HCT  39.6 09/14/2018 0941   PLT 128 (L) 09/14/2018 0941   MCV 107 (H) 09/14/2018 0941   MCH 36.2 (H) 09/14/2018 0941   MCH 35.3 (H) 09/04/2015 1059   MCHC 33.8 09/14/2018 0941   MCHC 33.1 09/04/2015 1059   RDW 12.8 09/14/2018 0941   LYMPHSABS 1.5  09/14/2018 0941   MONOABS 0.8 12/17/2012 1300   EOSABS 0.2 09/14/2018 0941   BASOSABS 0.0 09/14/2018 0941     Chemistry      Component Value Date/Time   NA 140 09/14/2018 0941   K 4.6 09/14/2018 0941   CL 100 09/14/2018 0941   CO2 23 09/14/2018 0941   BUN 30 (H) 09/14/2018 0941   CREATININE 1.08 09/14/2018 0941   CREATININE 0.75 09/04/2015 1059      Component Value Date/Time   CALCIUM 9.2 09/14/2018 0941   ALKPHOS 95 09/14/2018 0941   AST 20 09/14/2018 0941   ALT 15 09/14/2018 0941   BILITOT 1.6 (H) 09/14/2018 0941    IgA 596 mg percent, 09/14/2018       611,                     08/12/17       519,                     08/05/16       542                       06/19/15          Radiological Studies: No results found.  Impression:  1.  IgA monoclonal gammopathy undetermined significance Gammaglobulin level stable over time.  No evidence for progression to myeloma.  Amyloidosis not completely excluded.  2.  Idiopathic peripheral neuropathy primarily lower extremity Macrocytosis with normal H67 and folic acid levels in the past. Suspect there may be some element related to chronic alcohol use in the past.  3.  Coronary artery disease status post bypass surgery  4.  Tachybradycardia syndrome status post permanent pacemaker  5.  Chronic atrial fibrillation on chronic anticoagulation with Eliquis.  6.  Essential hypertension  7.  Hypothyroid on replacement  8.  Hyperlipidemia  9.  Diverticulosis with history of previous diverticulitis  10.  Active hemorrhoids  I will transition his hematology care to 1 of the doctors at the [facility].  CC: Patient Care Team: Mike Erp, MD as PCP - General (Internal Medicine)   Mike Hopper, MD, [place]  Hematology-Oncology/Internal Medicine     12/17/20192:03 PM

## 2018-09-22 ENCOUNTER — Ambulatory Visit (INDEPENDENT_AMBULATORY_CARE_PROVIDER_SITE_OTHER): Payer: Medicare Other

## 2018-09-22 ENCOUNTER — Telehealth: Payer: Self-pay

## 2018-09-22 DIAGNOSIS — I442 Atrioventricular block, complete: Secondary | ICD-10-CM

## 2018-09-22 NOTE — Progress Notes (Signed)
Remote pacemaker transmission.   

## 2018-09-22 NOTE — Telephone Encounter (Signed)
Spoke with pt and reminded pt of remote transmission that is due today. Pt verbalized understanding.   

## 2018-09-23 ENCOUNTER — Encounter: Payer: Self-pay | Admitting: Cardiology

## 2018-09-27 ENCOUNTER — Other Ambulatory Visit: Payer: Self-pay | Admitting: Cardiovascular Disease

## 2018-10-22 ENCOUNTER — Other Ambulatory Visit: Payer: Self-pay | Admitting: Neurology

## 2018-10-24 LAB — CUP PACEART REMOTE DEVICE CHECK
Battery Remaining Longevity: 149 mo
Battery Voltage: 2.78 V
Implantable Lead Implant Date: 20080311
Implantable Lead Model: 5076
Implantable Pulse Generator Implant Date: 20161206
Lead Channel Impedance Value: 666 Ohm
Lead Channel Pacing Threshold Amplitude: 0.75 V
Lead Channel Pacing Threshold Pulse Width: 0.4 ms
Lead Channel Setting Sensing Sensitivity: 2.8 mV
MDC IDC LEAD IMPLANT DT: 20080311
MDC IDC LEAD LOCATION: 753859
MDC IDC LEAD LOCATION: 753860
MDC IDC MSMT BATTERY IMPEDANCE: 160 Ohm
MDC IDC MSMT LEADCHNL RA IMPEDANCE VALUE: 67 Ohm
MDC IDC SESS DTM: 20191218160340
MDC IDC SET LEADCHNL RV PACING AMPLITUDE: 2 V
MDC IDC SET LEADCHNL RV PACING PULSEWIDTH: 0.4 ms
MDC IDC STAT BRADY RV PERCENT PACED: 99 %

## 2018-12-03 ENCOUNTER — Encounter: Payer: Self-pay | Admitting: Oncology

## 2018-12-03 ENCOUNTER — Telehealth: Payer: Self-pay | Admitting: Oncology

## 2018-12-03 NOTE — Telephone Encounter (Signed)
New hem appt has been scheduled for the pt to see Dr. Alen Blew on 3/26 at 11am. Letter mailed to the pt.

## 2018-12-06 ENCOUNTER — Encounter: Payer: Self-pay | Admitting: *Deleted

## 2018-12-22 ENCOUNTER — Ambulatory Visit (INDEPENDENT_AMBULATORY_CARE_PROVIDER_SITE_OTHER): Payer: Medicare Other | Admitting: *Deleted

## 2018-12-22 ENCOUNTER — Other Ambulatory Visit: Payer: Self-pay

## 2018-12-22 DIAGNOSIS — I442 Atrioventricular block, complete: Secondary | ICD-10-CM

## 2018-12-23 LAB — CUP PACEART REMOTE DEVICE CHECK
Battery Impedance: 160 Ohm
Battery Remaining Longevity: 150 mo
Battery Voltage: 2.78 V
Brady Statistic RV Percent Paced: 99 %
Implantable Lead Implant Date: 20080311
Implantable Lead Location: 753860
Implantable Lead Model: 5076
Lead Channel Impedance Value: 699 Ohm
Lead Channel Setting Pacing Amplitude: 2 V
Lead Channel Setting Pacing Pulse Width: 0.4 ms
Lead Channel Setting Sensing Sensitivity: 2.8 mV
MDC IDC LEAD IMPLANT DT: 20080311
MDC IDC LEAD LOCATION: 753859
MDC IDC MSMT LEADCHNL RA IMPEDANCE VALUE: 67 Ohm
MDC IDC MSMT LEADCHNL RV PACING THRESHOLD AMPLITUDE: 1 V
MDC IDC MSMT LEADCHNL RV PACING THRESHOLD PULSEWIDTH: 0.4 ms
MDC IDC PG IMPLANT DT: 20161206
MDC IDC SESS DTM: 20200318175746

## 2018-12-27 ENCOUNTER — Ambulatory Visit: Payer: Medicare Other | Admitting: Neurology

## 2018-12-28 ENCOUNTER — Telehealth: Payer: Self-pay | Admitting: Oncology

## 2018-12-28 ENCOUNTER — Encounter: Payer: Self-pay | Admitting: Oncology

## 2018-12-28 NOTE — Telephone Encounter (Signed)
Pt cld to reschedule appt with Dr. Alen Blew to 6/10 at 11am d/t coronavirus. Letter mailed with new appt date and time.

## 2018-12-30 ENCOUNTER — Encounter: Payer: Medicare Other | Admitting: Oncology

## 2018-12-30 ENCOUNTER — Encounter: Payer: Self-pay | Admitting: Cardiology

## 2018-12-30 NOTE — Progress Notes (Signed)
Remote pacemaker transmission.   

## 2019-01-04 ENCOUNTER — Telehealth: Payer: Self-pay | Admitting: Neurology

## 2019-01-04 ENCOUNTER — Other Ambulatory Visit: Payer: Self-pay | Admitting: Pharmacist

## 2019-01-04 MED ORDER — APIXABAN 5 MG PO TABS: 5.0000 mg | ORAL_TABLET | Freq: Two times a day (BID) | ORAL | 1 refills | Status: DC

## 2019-01-04 NOTE — Telephone Encounter (Signed)
Pt has called for a refill on his gabapentin (NEURONTIN) 100 MG capsule.  Pt states for this week he has enough.  Pt is asking for a refill to be sent to [facility].  Pt states while waiting on that mail order pt is asking for about a weeks worth be sent to  [facility]

## 2019-01-05 ENCOUNTER — Encounter: Payer: Medicare Other | Admitting: Oncology

## 2019-01-05 ENCOUNTER — Other Ambulatory Visit: Payer: Self-pay | Admitting: *Deleted

## 2019-01-05 MED ORDER — GABAPENTIN 100 MG PO CAPS: 100.0000 mg | ORAL_CAPSULE | Freq: Three times a day (TID) | ORAL | 0 refills | Status: DC

## 2019-01-05 NOTE — Telephone Encounter (Signed)
Spoke with Dr. Jaynee Eagles who approved 1 week refill of Gabapentin to [facility] and 3 month refill to [facility]. Refills sent as directed per v.o. Dr. Jaynee Eagles.

## 2019-01-27 ENCOUNTER — Telehealth: Payer: Self-pay

## 2019-01-27 NOTE — Telephone Encounter (Signed)
Patient returned your call.

## 2019-01-27 NOTE — Telephone Encounter (Signed)
Left message for patient to contact office in regards to his upcoming appointment.

## 2019-01-28 NOTE — Telephone Encounter (Signed)
Follow up  ° ° °Patient is returning call.  °

## 2019-01-31 NOTE — Telephone Encounter (Signed)
Virtual Visit Pre-Appointment Phone Call  "Mr Lolli, I am calling you today to discuss your upcoming appointment. We are currently trying to limit exposure to the virus that causes COVID-19 by seeing patients at home rather than in the office."  1. "What is the BEST phone number to call the day of the visit?" - include this in appointment notes  2. "Do you have or have access to (through a family member/friend) a smartphone with video capability that we can use for your visit?" a. If yes - list this number in appt notes as "cell" (if different from BEST phone #) and list the appointment type as a VIDEO visit in appointment notes b. If no - list the appointment type as a PHONE visit in appointment notes  3. Confirm consent - "In the setting of the current Covid19 crisis, you are scheduled for a VIDEO visit with your provider on 02/01/2019 at [facility].  Just as we do with many in-office visits, in order for you to participate in this visit, we must obtain consent.  If you'd like, I can send this to your mychart (if signed up) or email for you to review.  Otherwise, I can obtain your verbal consent now.  All virtual visits are billed to your insurance company just like a normal visit would be.  By agreeing to a virtual visit, we'd like you to understand that the technology does not allow for your provider to perform an examination, and thus may limit your provider's ability to fully assess your condition. If your provider identifies any concerns that need to be evaluated in person, we will make arrangements to do so.  Finally, though the technology is pretty good, we cannot assure that it will always work on either your or our end, and in the setting of a video visit, we may have to convert it to a phone-only visit.  In either situation, we cannot ensure that we have a secure connection.  Are you willing to proceed?" STAFF: Did the patient verbally acknowledge consent to telehealth visit? Document YES/NO  here: YES  4. Advise patient to be prepared - "Two hours prior to your appointment, go ahead and check your blood pressure, pulse, oxygen saturation, and your weight (if you have the equipment to check those) and write them all down. When your visit starts, your provider will ask you for this information. If you have an Apple Watch or Kardia device, please plan to have heart rate information ready on the day of your appointment. Please have a pen and paper handy nearby the day of the visit as well."  5. Give patient instructions for MyChart download to smartphone OR Doximity/Doxy.me as below if video visit (depending on what platform provider is using)  6. Inform patient they will receive a phone call 15 minutes prior to their appointment time (may be from unknown caller ID) so they should be prepared to answer    TELEPHONE CALL NOTE  Mike Price has been deemed a candidate for a follow-up tele-health visit to limit community exposure during the Covid-19 pandemic. I spoke with the patient via phone to ensure availability of phone/video source, confirm preferred email & phone number, and discuss instructions and expectations.  I reminded Mike Price to be prepared with any vital sign and/or heart rhythm information that could potentially be obtained via home monitoring, at the time of his visit. I reminded Mike Price to expect a phone call prior to his  visit.  Harold Hedge, CMA 01/31/2019 4:09 PM   INSTRUCTIONS FOR DOWNLOADING THE MYCHART APP TO SMARTPHONE  - The patient must first make sure to have activated MyChart and know their login information - If Apple, go to [employer] and type in MyChart in the search bar and download the app. If Android, ask patient to go to [employer] and type in [place] in the search bar and download the app. The app is free but as with any other app downloads, their phone may require them to verify saved payment information or Apple/Android  password.  - The patient will need to then log into the app with their MyChart username and password, and select [facility] as their healthcare provider to link the account. When it is time for your visit, go to the MyChart app, find appointments, and click Begin Video Visit. Be sure to Select Allow for your device to access the Microphone and Camera for your visit. You will then be connected, and your provider will be with you shortly.  **If they have any issues connecting, or need assistance please contact MyChart service desk [phone])**  **If using a computer, in order to ensure the best quality for their visit they will need to use either of the following Internet Browsers: [employer], or Google Chrome**  IF USING DOXIMITY or DOXY.ME - The patient will receive a link just prior to their visit by text.     FULL LENGTH CONSENT FOR TELE-HEALTH VISIT   I hereby voluntarily request, consent and authorize [facility] and its employed or contracted physicians, physician assistants, nurse practitioners or other licensed health care professionals (the Practitioner), to provide me with telemedicine health care services (the "Services") as deemed necessary by the treating Practitioner. I acknowledge and consent to receive the Services by the Practitioner via telemedicine. I understand that the telemedicine visit will involve communicating with the Practitioner through live audiovisual communication technology and the disclosure of certain medical information by electronic transmission. I acknowledge that I have been given the opportunity to request an in-person assessment or other available alternative prior to the telemedicine visit and am voluntarily participating in the telemedicine visit.  I understand that I have the right to withhold or withdraw my consent to the use of telemedicine in the course of my care at any time, without affecting my right to future care or treatment,  and that the Practitioner or I may terminate the telemedicine visit at any time. I understand that I have the right to inspect all information obtained and/or recorded in the course of the telemedicine visit and may receive copies of available information for a reasonable fee.  I understand that some of the potential risks of receiving the Services via telemedicine include:  [name] Kitchen Delay or interruption in medical evaluation due to technological equipment failure or disruption; . Information transmitted may not be sufficient (e.g. poor resolution of images) to allow for appropriate medical decision making by the Practitioner; and/or  . In rare instances, security protocols could fail, causing a breach of personal health information.  Furthermore, I acknowledge that it is my responsibility to provide information about my medical history, conditions and care that is complete and accurate to the best of my ability. I acknowledge that Practitioner's advice, recommendations, and/or decision may be based on factors not within their control, such as incomplete or inaccurate data provided by me or distortions of diagnostic images or specimens that may result from electronic transmissions. I  understand that the practice of medicine is not an exact science and that Practitioner makes no warranties or guarantees regarding treatment outcomes. I acknowledge that I will receive a copy of this consent concurrently upon execution via email to the email address I last provided but may also request a printed copy by calling the office of [facility].    I understand that my insurance will be billed for this visit.   I have read or had this consent read to me. . I understand the contents of this consent, which adequately explains the benefits and risks of the Services being provided via telemedicine.  . I have been provided ample opportunity to ask questions regarding this consent and the Services and have had my questions  answered to my satisfaction. . I give my informed consent for the services to be provided through the use of telemedicine in my medical care  By participating in this telemedicine visit I agree to the above.

## 2019-02-01 ENCOUNTER — Telehealth (INDEPENDENT_AMBULATORY_CARE_PROVIDER_SITE_OTHER): Payer: Medicare Other | Admitting: Cardiovascular Disease

## 2019-02-01 VITALS — HR 73 | Temp 97.3°F | Ht 71.0 in | Wt 239.0 lb

## 2019-02-01 DIAGNOSIS — I25708 Atherosclerosis of coronary artery bypass graft(s), unspecified, with other forms of angina pectoris: Secondary | ICD-10-CM

## 2019-02-01 DIAGNOSIS — E7800 Pure hypercholesterolemia, unspecified: Secondary | ICD-10-CM | POA: Diagnosis not present

## 2019-02-01 DIAGNOSIS — I472 Ventricular tachycardia: Secondary | ICD-10-CM

## 2019-02-01 DIAGNOSIS — I10 Essential (primary) hypertension: Secondary | ICD-10-CM

## 2019-02-01 DIAGNOSIS — I442 Atrioventricular block, complete: Secondary | ICD-10-CM

## 2019-02-01 DIAGNOSIS — I4729 Other ventricular tachycardia: Secondary | ICD-10-CM

## 2019-02-01 DIAGNOSIS — Z950 Presence of cardiac pacemaker: Secondary | ICD-10-CM

## 2019-02-01 NOTE — Patient Instructions (Signed)
Medication Instructions:  Continue same medications If you need a refill on your cardiac medications before your next appointment, please call your pharmacy.   Lab work: None ordered   Testing/Procedures: None ordered  Follow-Up: At [facility], you and your health needs are our priority.  As part of our continuing mission to provide you with exceptional heart care, we have created designated Provider Care Teams.  These Care Teams include your primary Cardiologist (physician) and Advanced Practice Providers (APPs -  Physician Assistants and Nurse Practitioners) who all work together to provide you with the care you need, when you need it. . Schedule follow up appointment with Dr.Croitoru in 12 months    Call 3 months before to schedule

## 2019-02-01 NOTE — Progress Notes (Signed)
Virtual Visit via Telephone Note   This visit type was conducted due to national recommendations for restrictions regarding the COVID-19 Pandemic (e.g. social distancing) in an effort to limit this patient's exposure and mitigate transmission in our community.  Due to his co-morbid illnesses, this patient is at least at moderate risk for complications without adequate follow up.  This format is felt to be most appropriate for this patient at this time.  The patient did not have access to video technology/had technical difficulties with video requiring transitioning to audio format only (telephone).  All issues noted in this document were discussed and addressed.  No physical exam could be performed with this format.  Please refer to the patient's chart for his  consent to telehealth for [facility].   Attempted video visit, but had technical issues that prevented it.  Evaluation Performed:  Follow-up visit  Date:  02/01/2019   ID:  Drakkar, Medeiros May 23, 1935, MRN [id number]  Patient Location: Home Provider Location: Home  PCP:  [name], MD  Cardiologist:  Daiva Nakayama (pacemaker) Electrophysiologist:  None   Chief Complaint:  Pacemaker follow up  History of Present Illness:    MURLE OTTING is a 84 y.o. male with permanent atrial fibrillation and complete heart block (pacemaker dependent). He has remote CAD issues (CABG x 4 , 2000), followed by Dr. Gwenlyn Found.  He has a variety af age related complaints, including urinary leakage, left knee and right shoulder arthritis and diminishing physical abilities in general, but denies any CV complaints.  The patient specifically denies any chest pain at rest exertion, dyspnea at rest or with exertion, orthopnea, paroxysmal nocturnal dyspnea, syncope, palpitations, focal neurological deficits, intermittent claudication, lower extremity edema, unexplained weight gain, cough, hemoptysis or wheezing.   Pacemaker remote download in the  last few weeks shows generator longevity 12.5  Years, >99% V pacing, a single 8-beat run nonsustained VT, good lead parameters.  The patient does not have symptoms concerning for COVID-19 infection (fever, chills, cough, or new shortness of breath).    Past Medical History:  Diagnosis Date  . Anemia   . Anxiety 01-11-13   tx. Clonazepam.  . Arthritis    Osteoarthritis-knees, fingers  . CAD (coronary artery disease)   . Diverticulitis   . Dysrhythmia 01-11-13    hx. A. Fib-Pacemaker implanted left chest  . Edema extremities 01-11-13   retains fluid in legs-right greater than left.  [name] Kitchen GERD (gastroesophageal reflux disease)   . Hyperlipemia   . Hypertension   . Hypothyroidism   [name] 07/10/2015   IgA 542 mg% 06/19/15. IgA lambda paraprotein on IFE, normal IgG & IgM  . Neuromuscular disorder (HCC)    neuropathy in feet  . Obstructive sleep apnea    on C Pap  . Pacemaker 12/15/06   3'08  . Thyroid disease   . TMJ tenderness 01-11-13   right side-has issues from time to time.   Past Surgical History:  Procedure Laterality Date  . APPENDECTOMY    . CARDIAC CATHETERIZATION  03/28/08   patent grafts, nl EF  . CARPAL TUNNEL RELEASE Right 2018  . CERVICAL LAMINECTOMY    . CORONARY ARTERY BYPASS GRAFT  2001   4 vessels-'00  . EP IMPLANTABLE DEVICE N/A 09/11/2015   Procedure: PPM Generator Changeout;  Surgeon: Sanda Klein, MD;  Location: [facility];  Service: Cardiovascular;  Laterality: N/A;  . HERNIA REPAIR    . INSERT /  REPLACE / REMOVE PACEMAKER     '08-inserted left chest  . JOINT REPLACEMENT     LTHA  . NM MYOCAR PERF WALL MOTION  02/12/2012   small fixed distal anteroapical/apical defect. No reversible ischemia  . PACEMAKER INSERTION  12/15/06   medtronic  . TONSILLECTOMY     child  . TOTAL KNEE ARTHROPLASTY Right 01/17/2013   Procedure: RIGHT TOTAL KNEE ARTHROPLASTY;  Surgeon: Gearlean Alf, MD;  Location: [facility];   Service: Orthopedics;  Laterality: Right;  . US ECHOCARDIOGRAPHY  12/28/2007   LA mod. dilated,RA mildly dilated     Current Meds  Medication Sig  . apixaban (ELIQUIS) 5 MG TABS tablet Take 1 tablet (5 mg total) by mouth 2 (two) times daily.  [name] Kitchen atorvastatin (LIPITOR) 40 MG tablet Take 40 mg by mouth every evening.   . clonazePAM (KLONOPIN) 0.5 MG tablet Take 0.25-0.5 mg by mouth 3 (three) times daily as needed for anxiety. Takes 1/2 tablet twice daily then a whole tablet at bedtime  . doxazosin (CARDURA) 2 MG tablet Take 1 mg by mouth at bedtime.  [name] Kitchen ezetimibe (ZETIA) 10 MG tablet Take 10 mg by mouth every evening.   . ferrous sulfate 325 (65 FE) MG tablet Take 325 mg by mouth every other day. On Monday, Wednesday and Friday  . furosemide (LASIX) 20 MG tablet Take 20 mg by mouth daily.   [name] Kitchen gabapentin (NEURONTIN) 100 MG capsule Take 1 capsule (100 mg total) by mouth 3 (three) times daily.  . Liniments (SALONPAS PAIN RELIEF PATCH EX) Apply 0.4 % topically as needed.  [name] Kitchen losartan (COZAAR) 100 MG tablet Take 100 mg by mouth daily.  . metoprolol (LOPRESSOR) 100 MG tablet Take 100 mg by mouth 2 (two) times daily.  . NON FORMULARY CPAP qhs  . omeprazole (PRILOSEC) 40 MG capsule Take 20 mg by mouth daily.   . Skin Protectants, Misc. (EUCERIN) cream Apply 1 application topically daily as needed for dry skin.  [name] Kitchen SYNTHROID 88 MCG tablet Take 88 mcg by mouth daily.  . traMADol (ULTRAM) 50 MG tablet Take 50 mg by mouth as needed.     Allergies:   Lisinopril and Mefloquine   Social History   Tobacco Use  . Smoking status: Former Smoker    Last attempt to quit: 01/12/1984    Years since quitting: 35.0  . Smokeless tobacco: Never Used  Substance Use Topics  . Alcohol use: Yes    Alcohol/week: 3.0 standard drinks    Types: 3 Shots of liquor per week    Comment: 2-3 drinks daily  . Drug use: No     Family Hx: The patient's family history includes Stroke in his unknown relative. There is no  history of Neuropathy.  ROS:   Please see the history of present illness.     All other systems reviewed and are negative.   Prior CV studies:   The following studies were reviewed today:  Full pacemaker download  Labs/Other Tests and Data Reviewed:    EKG:  An ECG dated 12/22/2017 was personally reviewed today and demonstrated:  atrial fibrillation and ventricular pacing  Recent Labs: 09/14/2018: ALT 15; BUN 30; Creatinine, Ser 1.08; Hemoglobin 13.4; Platelets 128; Potassium 4.6; Sodium 140   Recent Lipid Panel No results found for: CHOL, TRIG, HDL, CHOLHDL, LDLCALC, LDLDIRECT  Wt Readings from Last 3 Encounters:  02/01/19 239 lb (108.4 kg)  09/21/18 249 lb 1.6 oz (113 kg)  12/30/17 251 lb (113.9 kg)  Objective:    Vital Signs:  Pulse 73   Temp (!) 97.3 F (36.3 C)   Ht 5\' 11"  (1.803 m)   Wt 239 lb (108.4 kg)   SpO2 97%   BMI 33.33 kg/m    VITAL SIGNS:  reviewed unable to examine  ASSESSMENT & PLAN:    1. Permanent AFib: on appropriate anticoagulation. 2. CHB:  Pacemaker dependent. 3. Pacemaker:  Normal device function, remote downloads every 3 months 4. CAD: asymptomatic. On statin + zetia. 5. Eliquis:CHADSVasc 4 (age 75, CAD, HTN).No bleeding problems.  COVID-19 Education: The signs and symptoms of COVID-19 were discussed with the patient and how to seek care for testing (follow up with PCP or arrange E-visit).  The importance of social distancing was discussed today.  Time:   Today, I have spent 16 minutes with the patient with telehealth technology discussing the above problems.     Medication Adjustments/Labs and Tests Ordered: Current medicines are reviewed at length with the patient today.  Concerns regarding medicines are outlined above.   Tests Ordered: No orders of the defined types were placed in this encounter.   Medication Changes: No orders of the defined types were placed in this encounter.   Disposition:  Follow up 12 months   Signed, Sanda Klein, MD  02/01/2019 4:18 PM    [facility]

## 2019-02-15 ENCOUNTER — Telehealth: Payer: Self-pay | Admitting: Cardiovascular Disease

## 2019-02-15 NOTE — Telephone Encounter (Signed)
New message   Patient states that there is some information on my chart that is not correct. Please call to discuss.

## 2019-02-15 NOTE — Telephone Encounter (Signed)
Called patient, he had questions regarding overdue vaccines in the chart- advised that I could add what dates he had received them.   Patient had no other questions at this time.

## 2019-03-01 ENCOUNTER — Telehealth: Payer: Self-pay | Admitting: Cardiology

## 2019-03-01 NOTE — Telephone Encounter (Signed)
Patient call and wanted to know if he had a PPM or ICD. I informed pt that he only had a PPM. He stated that last night he was having a dream and it felt like something kicked him in the back and he was concerned that it was his device. He states that he feels fine today. He is going to send transmission. Please call back w/ results.

## 2019-03-02 ENCOUNTER — Other Ambulatory Visit: Payer: Self-pay | Admitting: Neurology

## 2019-03-02 NOTE — Telephone Encounter (Signed)
Pt contacted and informed he has PPM and not ICD. Manual transmission from 03/02/19 reviewed, 1 HVR episode , markers only. Advised PT no abnormalities noted.

## 2019-03-16 ENCOUNTER — Inpatient Hospital Stay: Payer: Medicare Other | Attending: Oncology | Admitting: Oncology

## 2019-03-16 ENCOUNTER — Other Ambulatory Visit: Payer: Self-pay

## 2019-03-16 ENCOUNTER — Encounter: Payer: Self-pay | Admitting: Oncology

## 2019-03-16 VITALS — BP 139/69 | HR 87 | Temp 98.9°F | Resp 18 | Ht 71.0 in | Wt 246.0 lb

## 2019-03-16 DIAGNOSIS — D472 Monoclonal gammopathy: Secondary | ICD-10-CM | POA: Diagnosis present

## 2019-03-16 DIAGNOSIS — G629 Polyneuropathy, unspecified: Secondary | ICD-10-CM | POA: Insufficient documentation

## 2019-03-16 NOTE — Progress Notes (Signed)
Hematology and Oncology Follow Up Visit  Mike Price [id number] 08-13-1935 84 y.o. 03/16/2019 10:44 AM [name], MDGreen, Christean Grief, MD   Principle Diagnosis: 84 year old man with IgA MGUS diagnosed in 2016.  He was found to have M spike of 0.2 g/dL.  His IgA level was 542.   Current therapy: Active surveillance.  He has no evidence to suggest multiple myeloma or amyloidosis.  Interim History: Mike Price presents today for a follow-up visit.  He is an 84 year old man followed by Dr. Beryle Beams previously for IgA MGUS and has been on active surveillance.  Since the last visit, he reports no major changes in his health.  Continues to have issues with neuropathy that has not changed at this time.  He ambulates with the help of walker without any recent falls or syncope.  He denies any worsening bone pain or pathological fractures.  He denies any recurrent infections.  He does not report any headaches, blurry vision, syncope or seizures. Does not report any fevers, chills or sweats.  Does not report any cough, wheezing or hemoptysis.  Does not report any chest pain, palpitation, orthopnea or leg edema.  Does not report any nausea, vomiting or abdominal pain.  Does not report any constipation or diarrhea.  Does not report any skeletal complaints.    Does not report frequency, urgency or hematuria.  Does not report any skin rashes or lesions. Does not report any heat or cold intolerance.  Does not report any lymphadenopathy or petechiae.  Does not report any anxiety or depression.  Remaining review of systems is negative.    Medications: I have reviewed the patient's current medications.  Current Outpatient Medications  Medication Sig Dispense Refill  . apixaban (ELIQUIS) 5 MG TABS tablet Take 1 tablet (5 mg total) by mouth 2 (two) times daily. 180 tablet 1  . atorvastatin (LIPITOR) 40 MG tablet Take 40 mg by mouth every evening.     . clonazePAM (KLONOPIN) 0.5 MG tablet Take 0.25-0.5 mg by mouth 3  (three) times daily as needed for anxiety. Takes 1/2 tablet twice daily then a whole tablet at bedtime    . doxazosin (CARDURA) 2 MG tablet Take 1 mg by mouth at bedtime.    [name] Kitchen ezetimibe (ZETIA) 10 MG tablet Take 10 mg by mouth every evening.     . ferrous sulfate 325 (65 FE) MG tablet Take 325 mg by mouth every other day. On Monday, Wednesday and Friday    . furosemide (LASIX) 20 MG tablet Take 20 mg by mouth daily.     [name] Kitchen gabapentin (NEURONTIN) 100 MG capsule TAKE 1 CAPSULE THREE TIMES A DAY 270 capsule 0  . Liniments (SALONPAS PAIN RELIEF PATCH EX) Apply 0.4 % topically as needed.    [name] Kitchen losartan (COZAAR) 100 MG tablet Take 100 mg by mouth daily.    . metoprolol (LOPRESSOR) 100 MG tablet Take 100 mg by mouth 2 (two) times daily.    . NON FORMULARY CPAP qhs    . omeprazole (PRILOSEC) 40 MG capsule Take 20 mg by mouth daily.     . Skin Protectants, Misc. (EUCERIN) cream Apply 1 application topically daily as needed for dry skin.    [name] Kitchen SYNTHROID 88 MCG tablet Take 88 mcg by mouth daily.    . traMADol (ULTRAM) 50 MG tablet Take 50 mg by mouth as needed.     No current facility-administered medications for this visit.      Allergies:  Allergies  Allergen Reactions  .  Lisinopril Cough  . Mefloquine Other (See Comments)    "made me crazy" anxious, upset    Past Medical History, Surgical history, Social history, and Family History were reviewed and updated.    Physical Exam:  ECOG:  General appearance: alert and cooperative appeared without distress. Head: Normocephalic, without obvious abnormality Oropharynx: No oral thrush or ulcers. Eyes: No scleral icterus.  Pupils are equal and round reactive to light. Lymph nodes: Cervical, supraclavicular, and axillary nodes normal. Heart:regular rate and rhythm, S1, S2 normal, no murmur, click, rub or gallop Lung:chest clear, no wheezing, rales, normal symmetric air entry Abdomin: soft, non-tender, without masses or organomegaly. Neurological:  No motor, sensory deficits.  Intact deep tendon reflexes. Skin: No rashes or lesions.  No ecchymosis or petechiae. Musculoskeletal: No joint deformity or effusion. Psychiatric: Mood and affect are appropriate.    Lab Results: Lab Results  Component Value Date   WBC 6.6 09/14/2018   HGB 13.4 09/14/2018   HCT 39.6 09/14/2018   MCV 107 (H) 09/14/2018   PLT 128 (L) 09/14/2018     Chemistry      Component Value Date/Time   NA 140 09/14/2018 0941   K 4.6 09/14/2018 0941   CL 100 09/14/2018 0941   CO2 23 09/14/2018 0941   BUN 30 (H) 09/14/2018 0941   CREATININE 1.08 09/14/2018 0941   CREATININE 0.75 09/04/2015 1059      Component Value Date/Time   CALCIUM 9.2 09/14/2018 0941   ALKPHOS 95 09/14/2018 0941   AST 20 09/14/2018 0941   ALT 15 09/14/2018 0941   BILITOT 1.6 (H) 09/14/2018 0941       Impression and Plan:   84 year old man with:  1.  IgA MGUS diagnosed in 2016.  He presented with mildly elevated IgA level with an M spike of 0.2 g/dL.  He is currently on active surveillance without any evidence to suggest symptomatic myeloma.  Laboratory data in the last 4 years were personally reviewed and discussed with the patient.  Recent protein studies obtained in December 2019 showed an M spike that is identical at 0.2 g/dL with IgA level that has not changed in 4 years.  The natural course of this disease as well as risk of progression to symptomatic myeloma was reiterated at this time.  Indication for treatment for this disease was also discussed.  He does not have any evidence to suggest endorgan disease with normal renal function, hemoglobin, electrolytes.  He is agreeable and understands this plan.  Based on these findings have recommended annual surveillance at this time.  2.  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   3.  Follow-up: Will be in 6 months for repeat laboratory testing.   25  minutes was spent with the patient  face-to-face today.  More than 50% of time was spent on reviewing his laboratory data, the natural course of this disease, risk of progression, indication for treatment as well as coordinating plan of care.   Zola Button, MD 6/10/202010:44 AM

## 2019-03-17 ENCOUNTER — Telehealth: Payer: Self-pay | Admitting: Oncology

## 2019-03-17 NOTE — Telephone Encounter (Signed)
I left a message regarding schedule will mail 

## 2019-03-23 ENCOUNTER — Ambulatory Visit (INDEPENDENT_AMBULATORY_CARE_PROVIDER_SITE_OTHER): Payer: Medicare Other | Admitting: *Deleted

## 2019-03-23 DIAGNOSIS — I442 Atrioventricular block, complete: Secondary | ICD-10-CM

## 2019-03-23 LAB — CUP PACEART REMOTE DEVICE CHECK
Battery Impedance: 184 Ohm
Battery Remaining Longevity: 145 mo
Battery Voltage: 2.79 V
Brady Statistic RV Percent Paced: 99 %
Date Time Interrogation Session: 20200617135701
Implantable Lead Implant Date: 20080311
Implantable Lead Implant Date: 20080311
Implantable Lead Location: 753859
Implantable Lead Location: 753860
Implantable Lead Model: 5076
Implantable Lead Model: 5076
Implantable Pulse Generator Implant Date: 20161206
Lead Channel Impedance Value: 67 Ohm
Lead Channel Impedance Value: 697 Ohm
Lead Channel Pacing Threshold Amplitude: 1 V
Lead Channel Pacing Threshold Pulse Width: 0.4 ms
Lead Channel Setting Pacing Amplitude: 2 V
Lead Channel Setting Pacing Pulse Width: 0.4 ms
Lead Channel Setting Sensing Sensitivity: 2.8 mV

## 2019-04-04 ENCOUNTER — Encounter: Payer: Self-pay | Admitting: Cardiology

## 2019-04-04 NOTE — Progress Notes (Signed)
Remote pacemaker transmission.   

## 2019-05-04 ENCOUNTER — Encounter: Payer: Self-pay | Admitting: Podiatry

## 2019-05-04 ENCOUNTER — Ambulatory Visit (INDEPENDENT_AMBULATORY_CARE_PROVIDER_SITE_OTHER): Payer: Medicare Other | Admitting: Podiatry

## 2019-05-04 ENCOUNTER — Other Ambulatory Visit: Payer: Self-pay

## 2019-05-04 DIAGNOSIS — B351 Tinea unguium: Secondary | ICD-10-CM | POA: Insufficient documentation

## 2019-05-04 DIAGNOSIS — M79675 Pain in left toe(s): Secondary | ICD-10-CM | POA: Diagnosis not present

## 2019-05-04 DIAGNOSIS — G629 Polyneuropathy, unspecified: Secondary | ICD-10-CM | POA: Insufficient documentation

## 2019-05-04 DIAGNOSIS — I25708 Atherosclerosis of coronary artery bypass graft(s), unspecified, with other forms of angina pectoris: Secondary | ICD-10-CM

## 2019-05-04 DIAGNOSIS — M79674 Pain in right toe(s): Secondary | ICD-10-CM

## 2019-05-04 NOTE — Progress Notes (Signed)
Complaint:  Visit Type: Patient returns to my office for continued preventative foot care services. Complaint: Patient states" my nails have grown long and thick and become painful to walk and wear shoes" Patient has been diagnosed with neuropathy and is taking gabapentin.. The patient presents for preventative foot care services. No changes to .  Patient is taking eliquiss.  Podiatric Exam: Vascular: dorsalis pedis and posterior tibial pulses are absent  bilateral. Capillary return is immediate. Temperature gradient is WNL. Skin turgor WNL  Sensorium: Diminished  Semmes Weinstein monofilament test. Diminished  tactile sensation bilaterally. Nail Exam: Pt has thick disfigured discolored nails with subungual debris noted bilateral entire nail hallux through fifth toenails Ulcer Exam: There is no evidence of ulcer or pre-ulcerative changes or infection. Orthopedic Exam: Muscle tone and strength are WNL. No limitations in general ROM. No crepitus or effusions noted. Foot type and digits show no abnormalities. Tailors bunion 5th left foot. Skin: No Porokeratosis. No infection or ulcers.  Red  Thin shiny skin dorsolateral aspect fifth metatarsal left foot.  Diagnosis:  Onychomycosis, , Pain in right toe, pain in left toes  Treatment & Plan Procedures and Treatment: Consent by patient was obtained for treatment procedures.   Debridement of mycotic and hypertrophic toenails, 1 through 5 bilateral and clearing of subungual debris. No ulceration, no infection noted.  Return Visit-Office Procedure: Patient instructed to return to the office for a follow up visit 3 months for continued evaluation and treatment.    Gardiner Barefoot DPM

## 2019-05-16 ENCOUNTER — Other Ambulatory Visit: Payer: Self-pay | Admitting: Neurology

## 2019-06-13 ENCOUNTER — Other Ambulatory Visit: Payer: Self-pay | Admitting: Neurology

## 2019-06-24 ENCOUNTER — Telehealth: Payer: Self-pay

## 2019-06-24 NOTE — Telephone Encounter (Signed)
The pt got the error code [id number] on his monitor. I gave the pt the number to [place] [facility] [place] support to get additional help.

## 2019-07-07 ENCOUNTER — Telehealth: Payer: Self-pay

## 2019-07-07 NOTE — Telephone Encounter (Signed)
Pt states he got his new monitor and will send a manual transmission either today or tomorrow.

## 2019-07-08 ENCOUNTER — Ambulatory Visit (INDEPENDENT_AMBULATORY_CARE_PROVIDER_SITE_OTHER): Payer: Medicare Other | Admitting: *Deleted

## 2019-07-08 DIAGNOSIS — I442 Atrioventricular block, complete: Secondary | ICD-10-CM

## 2019-07-10 LAB — CUP PACEART REMOTE DEVICE CHECK
Battery Impedance: 184 Ohm
Battery Remaining Longevity: 143 mo
Battery Voltage: 2.78 V
Brady Statistic RV Percent Paced: 99 %
Date Time Interrogation Session: 20201002165615
Implantable Lead Implant Date: 20080311
Implantable Lead Implant Date: 20080311
Implantable Lead Location: 753859
Implantable Lead Location: 753860
Implantable Lead Model: 5076
Implantable Lead Model: 5076
Implantable Pulse Generator Implant Date: 20161206
Lead Channel Impedance Value: 650 Ohm
Lead Channel Impedance Value: 67 Ohm
Lead Channel Pacing Threshold Amplitude: 1 V
Lead Channel Pacing Threshold Pulse Width: 0.4 ms
Lead Channel Setting Pacing Amplitude: 2 V
Lead Channel Setting Pacing Pulse Width: 0.4 ms
Lead Channel Setting Sensing Sensitivity: 2.8 mV

## 2019-07-12 ENCOUNTER — Encounter: Payer: Self-pay | Admitting: Cardiology

## 2019-07-12 NOTE — Progress Notes (Signed)
Remote pacemaker transmission.   

## 2019-07-26 ENCOUNTER — Other Ambulatory Visit: Payer: Self-pay

## 2019-07-26 DIAGNOSIS — Z20822 Contact with and (suspected) exposure to covid-19: Secondary | ICD-10-CM

## 2019-07-28 LAB — NOVEL CORONAVIRUS, NAA: SARS-CoV-2, NAA: NOT DETECTED

## 2019-08-09 ENCOUNTER — Other Ambulatory Visit: Payer: Self-pay | Admitting: Internal Medicine

## 2019-08-09 DIAGNOSIS — R109 Unspecified abdominal pain: Secondary | ICD-10-CM

## 2019-08-10 ENCOUNTER — Ambulatory Visit: Payer: Medicare Other | Admitting: Podiatry

## 2019-08-16 ENCOUNTER — Other Ambulatory Visit: Payer: Medicare Other

## 2019-08-16 ENCOUNTER — Ambulatory Visit
Admission: RE | Admit: 2019-08-16 | Discharge: 2019-08-16 | Disposition: A | Discharge status: Home / Self Care | Payer: Medicare Other | Source: Ambulatory Visit | Attending: Internal Medicine | Admitting: Internal Medicine

## 2019-08-16 DIAGNOSIS — R109 Unspecified abdominal pain: Secondary | ICD-10-CM

## 2019-08-16 MED ORDER — IOPAMIDOL (ISOVUE-300) INJECTION 61%
125.0000 mL | Freq: Once | INTRAVENOUS | Status: AC | PRN
  Administered 2019-08-16: 14:00:00 125 mL via INTRAVENOUS

## 2019-08-29 ENCOUNTER — Telehealth: Payer: Self-pay | Admitting: Cardiovascular Disease

## 2019-08-29 NOTE — Telephone Encounter (Signed)
New Messages  Pt states that he has a heart murmur.  Please call

## 2019-08-29 NOTE — Telephone Encounter (Signed)
Spoke with pt, his medical doctor told the patient he had a soft heart murmur. Advised the patient that he has mitral regurgitation by echo in 2016 and that is probably what he heard.

## 2019-09-15 ENCOUNTER — Other Ambulatory Visit: Payer: Self-pay

## 2019-09-15 ENCOUNTER — Inpatient Hospital Stay: Payer: Medicare Other

## 2019-09-15 ENCOUNTER — Inpatient Hospital Stay: Payer: Medicare Other | Attending: Oncology | Admitting: Oncology

## 2019-09-15 VITALS — BP 147/69 | HR 91 | Temp 98.7°F | Resp 18 | Ht 71.0 in | Wt 253.5 lb

## 2019-09-15 DIAGNOSIS — D472 Monoclonal gammopathy: Secondary | ICD-10-CM | POA: Insufficient documentation

## 2019-09-15 DIAGNOSIS — D696 Thrombocytopenia, unspecified: Secondary | ICD-10-CM | POA: Diagnosis not present

## 2019-09-15 DIAGNOSIS — I25708 Atherosclerosis of coronary artery bypass graft(s), unspecified, with other forms of angina pectoris: Secondary | ICD-10-CM

## 2019-09-15 LAB — CMP (CANCER CENTER ONLY)
ALT: 14 U/L (ref 0–44)
AST: 26 U/L (ref 15–41)
Albumin: 3.4 g/dL — ABNORMAL LOW (ref 3.5–5.0)
Alkaline Phosphatase: 174 U/L — ABNORMAL HIGH (ref 38–126)
Anion gap: 7 (ref 5–15)
BUN: 22 mg/dL (ref 8–23)
CO2: 30 mmol/L (ref 22–32)
Calcium: 9.2 mg/dL (ref 8.9–10.3)
Chloride: 103 mmol/L (ref 98–111)
Creatinine: 1.06 mg/dL (ref 0.61–1.24)
GFR, Est AFR Am: >60 mL/min (ref >60)
GFR, Estimated: >60 mL/min (ref >60)
Glucose, Bld: 110 mg/dL — ABNORMAL HIGH (ref 70–99)
Potassium: 4.6 mmol/L (ref 3.5–5.1)
Sodium: 140 mmol/L (ref 135–145)
Total Bilirubin: 2.8 mg/dL — ABNORMAL HIGH (ref 0.3–1.2)
Total Protein: 7.5 g/dL (ref 6.5–8.1)

## 2019-09-15 LAB — CBC WITH DIFFERENTIAL (CANCER CENTER ONLY)
Abs Immature Granulocytes: 0.02 10*3/uL (ref 0.00–0.07)
Basophils Absolute: 0 10*3/uL (ref 0.0–0.1)
Basophils Relative: 1 %
Eosinophils Absolute: 0.2 10*3/uL (ref 0.0–0.5)
Eosinophils Relative: 3 %
HCT: 43.8 % (ref 39.0–52.0)
Hemoglobin: 14.3 g/dL (ref 13.0–17.0)
Immature Granulocytes: 0 %
Lymphocytes Relative: 21 %
Lymphs Abs: 1.2 10*3/uL (ref 0.7–4.0)
MCH: 36.1 pg — ABNORMAL HIGH (ref 26.0–34.0)
MCHC: 32.6 g/dL (ref 30.0–36.0)
MCV: 110.6 fL — ABNORMAL HIGH (ref 80.0–100.0)
Monocytes Absolute: 0.6 10*3/uL (ref 0.1–1.0)
Monocytes Relative: 12 %
Neutro Abs: 3.6 10*3/uL (ref 1.7–7.7)
Neutrophils Relative %: 63 %
Platelet Count: 89 10*3/uL — ABNORMAL LOW (ref 150–400)
RBC: 3.96 MIL/uL — ABNORMAL LOW (ref 4.22–5.81)
RDW: 14.3 % (ref 11.5–15.5)
WBC Count: 5.6 10*3/uL (ref 4.0–10.5)
nRBC: 0 % (ref 0.0–0.2)

## 2019-09-15 NOTE — Progress Notes (Signed)
Hematology and Oncology Follow Up Visit  Mike Price [id number] Sep 22, 1935 84 y.o. 09/15/2019 10:13 AM Mike Bury, MD   Principle Diagnosis: 84 year old man with monoclonal gammopathy of undetermined significance diagnosed in 2016.  He was found to have IgA of 542 with M spike of 0.2 g/dL.  He has no symptomatic myeloma at this time.   Current therapy: Active surveillance.     Interim History: Mike Price returns today for repeat evaluation.  Since the last visit, he reports no major changes in his health.  He still has limited mobility at this time and currently uses a walker at home.  He denies any recent falls or syncope.  He denies any worsening bone pain or pathological fractures.  He does report chronic arthritic pain that is diffuse and not localized.  He denies any recent hospitalization.   Patient denied any alteration mental status, neuropathy, confusion or dizziness.  Denies any headaches or lethargy.  Denies any night sweats, weight loss or changes in appetite.  Denied orthopnea, dyspnea on exertion or chest discomfort.  Denies shortness of breath, difficulty breathing hemoptysis or cough.  Denies any abdominal distention, nausea, early satiety or dyspepsia.  Denies any hematuria, frequency, dysuria or nocturia.  Denies any skin irritation, dryness or rash.  Denies any ecchymosis or petechiae.  Denies any lymphadenopathy or clotting.  Denies any heat or cold intolerance.  Denies any anxiety or depression.  Remaining review of system is negative.    Medications: Unchanged on review. Current Outpatient Medications  Medication Sig Dispense Refill  . apixaban (ELIQUIS) 5 MG TABS tablet Take 1 tablet (5 mg total) by mouth 2 (two) times daily. 180 tablet 1  . atorvastatin (LIPITOR) 40 MG tablet Take 40 mg by mouth every evening.     . clonazePAM (KLONOPIN) 0.5 MG tablet Take 0.25-0.5 mg by mouth 3 (three) times daily as needed for anxiety. Takes 1/2 tablet twice  daily then a whole tablet at bedtime    . doxazosin (CARDURA) 2 MG tablet Take 1 mg by mouth at bedtime.    [name] Kitchen ezetimibe (ZETIA) 10 MG tablet Take 10 mg by mouth every evening.     . ferrous sulfate 325 (65 FE) MG tablet Take 325 mg by mouth every other day. On Monday, Wednesday and Friday    . furosemide (LASIX) 20 MG tablet Take 20 mg by mouth daily.     [name] Kitchen gabapentin (NEURONTIN) 100 MG capsule TAKE 1 CAPSULE THREE TIMES A DAY (MUST HAVE APPOINTMENT FOR FURTHER REFILLS) 90 capsule 11  . Liniments (SALONPAS PAIN RELIEF PATCH EX) Apply 0.4 % topically as needed.    [name] Kitchen losartan (COZAAR) 100 MG tablet Take 100 mg by mouth daily.    . metoprolol (LOPRESSOR) 100 MG tablet Take 100 mg by mouth 2 (two) times daily.    . NON FORMULARY CPAP qhs    . omeprazole (PRILOSEC) 40 MG capsule Take 20 mg by mouth daily.     . prednisoLONE acetate (PRED FORTE) 1 % ophthalmic suspension     . Skin Protectants, Misc. (EUCERIN) cream Apply 1 application topically daily as needed for dry skin.    [name] Kitchen SYNTHROID 100 MCG tablet     . traMADol (ULTRAM) 50 MG tablet Take 50 mg by mouth as needed.     No current facility-administered medications for this visit.     Allergies:  Allergies  Allergen Reactions  . Mefloquine Other (See Comments)    "made me crazy" anxious, upset  .  Lisinopril Cough    Past Medical History, Surgical history, Social history, and Family History without any changes on review.    Physical Exam: Blood pressure (!) 147/69, pulse 91, temperature 98.7 F (37.1 C), temperature source Temporal, resp. rate 18, height 5' 11"  (1.803 m), weight 253 lb 8 oz (115 kg), SpO2 92 %.   ECOG: 1    General appearance: Comfortable appearing without any discomfort Head: Normocephalic without any trauma Oropharynx: Mucous membranes are moist and pink without any thrush or ulcers. Eyes: Pupils are equal and round reactive to light. Lymph nodes: No cervical, supraclavicular, inguinal or axillary  lymphadenopathy.   Heart:regular rate and rhythm.  S1 and S2 without leg edema. Lung: Clear without any rhonchi or wheezes.  No dullness to percussion. Abdomin: Soft, nontender, nondistended with good bowel sounds.  No hepatosplenomegaly. Musculoskeletal: No joint deformity or effusion.  Full range of motion noted. Neurological: No deficits noted on motor, sensory and deep tendon reflex exam. Skin: No petechial rash or dryness.  Appeared moist.      Lab Results: Lab Results  Component Value Date   WBC 6.6 09/14/2018   HGB 13.4 09/14/2018   HCT 39.6 09/14/2018   MCV 107 (H) 09/14/2018   PLT 128 (L) 09/14/2018     Chemistry      Component Value Date/Time   NA 140 09/14/2018 0941   K 4.6 09/14/2018 0941   CL 100 09/14/2018 0941   CO2 23 09/14/2018 0941   BUN 30 (H) 09/14/2018 0941   CREATININE 1.08 09/14/2018 0941   CREATININE 0.75 09/04/2015 1059      Component Value Date/Time   CALCIUM 9.2 09/14/2018 0941   ALKPHOS 95 09/14/2018 0941   AST 20 09/14/2018 0941   ALT 15 09/14/2018 0941   BILITOT 1.6 (H) 09/14/2018 0941       Impression and Plan:   84 year old man with:  1.  Monoclonal gammopathy of undetermined significance (MGUS) diagnosed in 2016.  He was found to have IgA subtype without any evidence of symptomatic myeloma.    Protein studies obtained in December 2019 did not show any dramatic difference to previous counts.  Is IgA level remains mildly elevated with an M spike that remains relatively low.  The natural course of this disease and risk of progression into multiple myeloma was updated.  Laboratory from today showed normal hemoglobin and mild thrombocytopenia.  Risks and benefits of continuing active surveillance at this time was reviewed.  Indication for treatment would include worsening anemia, renal disease as well as symptomatic bone involvement.  For the time being we will continue with active surveillance given his overall stable counts.   2.   Thrombocytopenia: Platelet count continues to be mildly low although asymptomatic at this time.  Autoimmune etiology is is likely at this time.  We will continue to monitor and repeat his CBC in 6 months.   3.  Follow-up: In 6 months for repeat evaluation.   15  minutes was spent with the patient face-to-face today.  More than 50% of time was spent on updating his disease status, reviewing laboratory data and coordinating future plan of care.   Zola Button, MD 12/10/202010:13 AM

## 2019-09-16 ENCOUNTER — Telehealth: Payer: Self-pay | Admitting: Oncology

## 2019-09-16 LAB — KAPPA/LAMBDA LIGHT CHAINS
Kappa free light chain: 42.1 mg/L — ABNORMAL HIGH (ref 3.3–19.4)
Kappa, lambda light chain ratio: 1.09 (ref 0.26–1.65)
Lambda free light chains: 38.5 mg/L — ABNORMAL HIGH (ref 5.7–26.3)

## 2019-09-16 NOTE — Telephone Encounter (Signed)
Scheduled appt per 12/10 los.,  A calendar will mailed out.

## 2019-09-19 LAB — MULTIPLE MYELOMA PANEL, SERUM
Albumin SerPl Elph-Mcnc: 3.5 g/dL (ref 2.9–4.4)
Albumin/Glob SerPl: 1.1 (ref 0.7–1.7)
Alpha 1: 0.3 g/dL (ref 0.0–0.4)
Alpha2 Glob SerPl Elph-Mcnc: 0.6 g/dL (ref 0.4–1.0)
B-Globulin SerPl Elph-Mcnc: 1.1 g/dL (ref 0.7–1.3)
Gamma Glob SerPl Elph-Mcnc: 1.4 g/dL (ref 0.4–1.8)
Globulin, Total: 3.4 g/dL (ref 2.2–3.9)
IgA: 781 mg/dL — ABNORMAL HIGH (ref 61–437)
IgG (Immunoglobin G), Serum: 1180 mg/dL (ref 603–1613)
IgM (Immunoglobulin M), Srm: 35 mg/dL (ref 15–143)
M Protein SerPl Elph-Mcnc: 0.3 g/dL — ABNORMAL HIGH
Total Protein ELP: 6.9 g/dL (ref 6.0–8.5)

## 2019-10-04 ENCOUNTER — Other Ambulatory Visit: Payer: Self-pay | Admitting: Cardiovascular Disease

## 2019-10-10 ENCOUNTER — Ambulatory Visit (INDEPENDENT_AMBULATORY_CARE_PROVIDER_SITE_OTHER): Payer: Medicare Other | Admitting: *Deleted

## 2019-10-10 DIAGNOSIS — I442 Atrioventricular block, complete: Secondary | ICD-10-CM | POA: Diagnosis not present

## 2019-10-12 ENCOUNTER — Telehealth: Payer: Self-pay

## 2019-10-12 LAB — CUP PACEART REMOTE DEVICE CHECK
Battery Impedance: 184 Ohm
Battery Remaining Longevity: 143 mo
Battery Voltage: 2.79 V
Brady Statistic RV Percent Paced: 99 %
Date Time Interrogation Session: 20210105113458
Implantable Lead Implant Date: 20080311
Implantable Lead Implant Date: 20080311
Implantable Lead Location: 753859
Implantable Lead Location: 753860
Implantable Lead Model: 5076
Implantable Lead Model: 5076
Implantable Pulse Generator Implant Date: 20161206
Lead Channel Impedance Value: 635 Ohm
Lead Channel Impedance Value: 67 Ohm
Lead Channel Pacing Threshold Amplitude: 0.875 V
Lead Channel Pacing Threshold Pulse Width: 0.4 ms
Lead Channel Setting Pacing Amplitude: 2 V
Lead Channel Setting Pacing Pulse Width: 0.4 ms
Lead Channel Setting Sensing Sensitivity: 2.8 mV

## 2019-10-12 NOTE — Telephone Encounter (Signed)
Left message for patient to remind of missed remote transmission.  

## 2019-10-13 ENCOUNTER — Inpatient Hospital Stay (HOSPITAL_COMMUNITY)
Admission: EM | Admit: 2019-10-13 | Discharge: 2019-10-19 | DRG: 291 | Disposition: A | Discharge status: Home Health | Payer: Medicare Other | Attending: Internal Medicine | Admitting: Internal Medicine

## 2019-10-13 ENCOUNTER — Emergency Department (HOSPITAL_COMMUNITY): Payer: Medicare Other

## 2019-10-13 ENCOUNTER — Encounter (HOSPITAL_COMMUNITY): Payer: Self-pay | Admitting: Emergency Medicine

## 2019-10-13 DIAGNOSIS — M19041 Primary osteoarthritis, right hand: Secondary | ICD-10-CM | POA: Diagnosis present

## 2019-10-13 DIAGNOSIS — E039 Hypothyroidism, unspecified: Secondary | ICD-10-CM | POA: Diagnosis present

## 2019-10-13 DIAGNOSIS — N289 Disorder of kidney and ureter, unspecified: Secondary | ICD-10-CM | POA: Diagnosis not present

## 2019-10-13 DIAGNOSIS — Z950 Presence of cardiac pacemaker: Secondary | ICD-10-CM

## 2019-10-13 DIAGNOSIS — R0989 Other specified symptoms and signs involving the circulatory and respiratory systems: Secondary | ICD-10-CM

## 2019-10-13 DIAGNOSIS — Z79890 Hormone replacement therapy (postmenopausal): Secondary | ICD-10-CM

## 2019-10-13 DIAGNOSIS — G4733 Obstructive sleep apnea (adult) (pediatric): Secondary | ICD-10-CM | POA: Diagnosis present

## 2019-10-13 DIAGNOSIS — Z888 Allergy status to other drugs, medicaments and biological substances status: Secondary | ICD-10-CM

## 2019-10-13 DIAGNOSIS — E785 Hyperlipidemia, unspecified: Secondary | ICD-10-CM | POA: Diagnosis present

## 2019-10-13 DIAGNOSIS — G629 Polyneuropathy, unspecified: Secondary | ICD-10-CM

## 2019-10-13 DIAGNOSIS — D7589 Other specified diseases of blood and blood-forming organs: Secondary | ICD-10-CM

## 2019-10-13 DIAGNOSIS — N1831 Chronic kidney disease, stage 3a: Secondary | ICD-10-CM | POA: Diagnosis present

## 2019-10-13 DIAGNOSIS — N179 Acute kidney failure, unspecified: Secondary | ICD-10-CM

## 2019-10-13 DIAGNOSIS — I10 Essential (primary) hypertension: Secondary | ICD-10-CM | POA: Diagnosis present

## 2019-10-13 DIAGNOSIS — M25552 Pain in left hip: Secondary | ICD-10-CM

## 2019-10-13 DIAGNOSIS — I130 Hypertensive heart and chronic kidney disease with heart failure and stage 1 through stage 4 chronic kidney disease, or unspecified chronic kidney disease: Principal | ICD-10-CM | POA: Diagnosis present

## 2019-10-13 DIAGNOSIS — M170 Bilateral primary osteoarthritis of knee: Secondary | ICD-10-CM | POA: Diagnosis present

## 2019-10-13 DIAGNOSIS — D696 Thrombocytopenia, unspecified: Secondary | ICD-10-CM | POA: Diagnosis present

## 2019-10-13 DIAGNOSIS — R531 Weakness: Secondary | ICD-10-CM | POA: Diagnosis present

## 2019-10-13 DIAGNOSIS — Z79899 Other long term (current) drug therapy: Secondary | ICD-10-CM

## 2019-10-13 DIAGNOSIS — Z87891 Personal history of nicotine dependence: Secondary | ICD-10-CM

## 2019-10-13 DIAGNOSIS — R296 Repeated falls: Secondary | ICD-10-CM | POA: Diagnosis present

## 2019-10-13 DIAGNOSIS — I509 Heart failure, unspecified: Secondary | ICD-10-CM

## 2019-10-13 DIAGNOSIS — Z66 Do not resuscitate: Secondary | ICD-10-CM | POA: Diagnosis present

## 2019-10-13 DIAGNOSIS — M19042 Primary osteoarthritis, left hand: Secondary | ICD-10-CM | POA: Diagnosis present

## 2019-10-13 DIAGNOSIS — K219 Gastro-esophageal reflux disease without esophagitis: Secondary | ICD-10-CM | POA: Diagnosis present

## 2019-10-13 DIAGNOSIS — I872 Venous insufficiency (chronic) (peripheral): Secondary | ICD-10-CM | POA: Diagnosis present

## 2019-10-13 DIAGNOSIS — I25810 Atherosclerosis of coronary artery bypass graft(s) without angina pectoris: Secondary | ICD-10-CM | POA: Diagnosis present

## 2019-10-13 DIAGNOSIS — Z7901 Long term (current) use of anticoagulants: Secondary | ICD-10-CM

## 2019-10-13 DIAGNOSIS — D472 Monoclonal gammopathy: Secondary | ICD-10-CM | POA: Diagnosis present

## 2019-10-13 DIAGNOSIS — I2510 Atherosclerotic heart disease of native coronary artery without angina pectoris: Secondary | ICD-10-CM | POA: Diagnosis present

## 2019-10-13 DIAGNOSIS — J9601 Acute respiratory failure with hypoxia: Secondary | ICD-10-CM | POA: Diagnosis present

## 2019-10-13 DIAGNOSIS — I480 Paroxysmal atrial fibrillation: Secondary | ICD-10-CM | POA: Diagnosis present

## 2019-10-13 DIAGNOSIS — F419 Anxiety disorder, unspecified: Secondary | ICD-10-CM | POA: Diagnosis present

## 2019-10-13 DIAGNOSIS — Z20822 Contact with and (suspected) exposure to covid-19: Secondary | ICD-10-CM | POA: Diagnosis present

## 2019-10-13 DIAGNOSIS — I442 Atrioventricular block, complete: Secondary | ICD-10-CM | POA: Diagnosis present

## 2019-10-13 DIAGNOSIS — N400 Enlarged prostate without lower urinary tract symptoms: Secondary | ICD-10-CM | POA: Diagnosis present

## 2019-10-13 LAB — BASIC METABOLIC PANEL
Anion gap: 7 (ref 5–15)
BUN: 28 mg/dL — ABNORMAL HIGH (ref 8–23)
CO2: 31 mmol/L (ref 22–32)
Calcium: 9.1 mg/dL (ref 8.9–10.3)
Chloride: 103 mmol/L (ref 98–111)
Creatinine, Ser: 1.42 mg/dL — ABNORMAL HIGH (ref 0.61–1.24)
GFR calc Af Amer: 53 mL/min — ABNORMAL LOW (ref >60)
GFR calc non Af Amer: 46 mL/min — ABNORMAL LOW (ref >60)
Glucose, Bld: 119 mg/dL — ABNORMAL HIGH (ref 70–99)
Potassium: 4.5 mmol/L (ref 3.5–5.1)
Sodium: 141 mmol/L (ref 135–145)

## 2019-10-13 LAB — URINALYSIS, ROUTINE W REFLEX MICROSCOPIC
Bacteria, UA: NONE SEEN
Bilirubin Urine: NEGATIVE
Glucose, UA: NEGATIVE mg/dL
Hgb urine dipstick: NEGATIVE
Ketones, ur: NEGATIVE mg/dL
Leukocytes,Ua: NEGATIVE
Nitrite: NEGATIVE
Protein, ur: 100 mg/dL — AB
Specific Gravity, Urine: 1.021 (ref 1.005–1.030)
pH: 6 (ref 5.0–8.0)

## 2019-10-13 LAB — CBC
HCT: 42.9 % (ref 39.0–52.0)
Hemoglobin: 13.8 g/dL (ref 13.0–17.0)
MCH: 36.3 pg — ABNORMAL HIGH (ref 26.0–34.0)
MCHC: 32.2 g/dL (ref 30.0–36.0)
MCV: 112.9 fL — ABNORMAL HIGH (ref 80.0–100.0)
Platelets: 99 10*3/uL — ABNORMAL LOW (ref 150–400)
RBC: 3.8 MIL/uL — ABNORMAL LOW (ref 4.22–5.81)
RDW: 14.9 % (ref 11.5–15.5)
WBC: 5.9 10*3/uL (ref 4.0–10.5)
nRBC: 0 % (ref 0.0–0.2)

## 2019-10-13 LAB — CBG MONITORING, ED: Glucose-Capillary: 73 mg/dL (ref 70–99)

## 2019-10-13 MED ORDER — LACTATED RINGERS IV BOLUS
1000.0000 mL | Freq: Once | INTRAVENOUS | Status: AC
  Administered 2019-10-13: 20:00:00 1000 mL via INTRAVENOUS

## 2019-10-13 MED ORDER — SODIUM CHLORIDE 0.9% FLUSH: 3.0000 mL | Freq: Once | INTRAVENOUS | Status: DC

## 2019-10-13 MED ORDER — LORAZEPAM 2 MG/ML IJ SOLN: 1.0000 mg | INTRAMUSCULAR | Status: DC | PRN

## 2019-10-13 MED ORDER — LORAZEPAM 1 MG PO TABS
1.0000 mg | ORAL_TABLET | Freq: Once | ORAL | Status: DC | PRN
  Filled 2019-10-13: qty 1

## 2019-10-13 NOTE — Plan of Care (Addendum)
From PCP at [facility] Dr. Sueanne Margarita Message me if you would like to talk or have any questions.  This is unorthodox, but couldn't think of another way to tell you his medical history and the background of what was going on.... I have not seen the patient today and by no means is meant to diagnose him or act as a plan, only to give background.  Thanks for your help  "Patient is a 85 year old male with a history of CAD status post CABG~20 years ago OSA on CPAP, hypertension, permanent A. fib on Eliquis, MGUS, pacemaker who states that he has become relatively deconditioned since COVID.  He is a difficult historian.  He thinks over the past week or 2 he has developed increased lower extremity edema with with increased venous stasis dermatitis after a gash was created from a fall.  We have been trying to get him to work with physical therapy and has just started with limited results.  I saw him in the clinic on 10/11/2019 and asked him to increase his Lasix to 40 mg from 20 mg to see if we can get some fluid off of him.  His wife thinks he is short of breath chronically but talking to him on the phone today he seemed to have increased respiratory rate and had trouble completing full sentences so I encouraged him to call EMS at this time.  He says he has been adherent to the 40 mg of Lasix and this is caused him to get up to use the restroom more frequently.  He has had a total of 4 falls, having to call EMS each time to come and pick him up.  He denies orthostasis despite having softer pressures.  He denies hitting his head.  He states the falls are from left leg weakness which he thinks might be from leg pain.  Denies any other symptoms of stroke.  I doubt he has a DVT in his left lower extremity since he is on Eliquis but it is possible.  Consider a d-dimer  His wife thinks that he is acting more odd than usual, I sense some memory concerns, although I just started taking care of him and he  seems to try and take control of his health.  What wife stated gives me concern for AMS.  You might need the wife's help with the history.  Thinks anginal equivalent is SOB from many years ago.  I told him he could try an albuterol puffer to see if this help with his shortness of breath prior to calling EMS.    Altered mental status - s/p fall on anticoagulation? head scan?  doubt from keflex, ?infection Cellulitis - based on appeareance, suspect a large part is from venous stasis dermatitis but seemed to have some color change aroudn the abrasion from his previous fall. SOB or is this just close to his baseline? Wife was concerned with AMS as above. - some concern for hypoxia with relative altered mental status as above. Wife states O2 sat was 98% and it is unclear if this is indeed the case.  He mentions he has to sleep in a recliner now and perhaps his bed is up too high which is been part of the falls.  I wonder if the recliner is an indication of being in heart failure since he does think he has been gaining some weight recently with increased peripheral edema. --With his history of heart disease I am a little concerned  about a MI.  Anginal equivalent was SOB he states.... So EKG and troponins I think are warranted -Would recommend a BNP to see a baseline/if there was an indication he was in heart failure -Doubt PE while on eliquis, but would ask again if he has been compliant. Consider LLQ [place] --h/o smoking, see if albuterol inhaler helped, doubt COPD/asthma exacerbation --consider COVID. Poor mobility-my ultimate plan was to have the patient get some fluid off of his legs to make his movement better, we are in the process of getting the right compression stockings for him and increased the Lasix for 3 days.  PT at home. -Please do a physical exam to assess for any signs of stroke or if his strength and movement is just limited by pain. Weakness... electrolyte abnormalities..  Monday appt  with Nurse Practitioner is already scheduled  Chronic Conditions MGUS - just monitoring, follows with Heme/onc HTN- softer pressures at 110/80 but he denied orthostasis so kept as is even increasing lasix.  Consider going down on the BP meds.... OSA on CPAP Hypothyroidism - on levothyroxine Permanent Afib, pacemaker - on eliquis CAD- statin and zetia"  He has an appt on Monday with a nurse practioner if he is not admitted... I appreciate your help...  Sincerely, Dr. Sueanne Margarita, DO

## 2019-10-13 NOTE — ED Triage Notes (Signed)
Pt arrives from home via with frequent falls per ems they went to home 3 times yesterday to help him out of floor - did not go to ER this times. Denies any falls today bit wife called due to he staye din recliner since getting up from fall yesterday has been "to weak to get back up" Bilateral leg weakness has been ongoing over 1 week.

## 2019-10-13 NOTE — Progress Notes (Signed)
Pt has MR UNSAFE pacemaker, unable to have MRI. RN aware.

## 2019-10-13 NOTE — ED Provider Notes (Signed)
[facility] EMERGENCY DEPARTMENT Provider Note   CSN: [id number] Arrival date & time: 10/13/19  1325     History Chief Complaint  Patient presents with  . Fall    Mike Price is a 85 y.o. male.  HPI Mike Price is a 85 y.o. male with a medical history of anxiety, cad, hld, htn who presents to the ED for frequent falls and concern of left lower leg weakness.  He reports yesterday having 3 falls yesterday at home which he attributes to new onset left lower extremity weakness.  He denies any injury from his fall, did not hit his head, no LOC.  He takes no blood thinners.  He denies acute pain of his left lower extremity, does have some chronic pain of his left lower extremity.  He has severe stasis dermatitis of both his lower extremities.  He denies any numbness of extremities, recent illness, fever, cough, chest pain, shortness of breath.     Past Medical History:  Diagnosis Date  . Anemia   . Anxiety 01-11-13   tx. Clonazepam.  . Arthritis    Osteoarthritis-knees, fingers  . CAD (coronary artery disease)   . Diverticulitis   . Dysrhythmia 01-11-13    hx. A. Fib-Pacemaker implanted left chest  . Edema extremities 01-11-13   retains fluid in legs-right greater than left.  [name] Kitchen GERD (gastroesophageal reflux disease)   . Hyperlipemia   . Hypertension   . Hypothyroidism   [name] 07/10/2015   IgA 542 mg% 06/19/15. IgA lambda paraprotein on IFE, normal IgG & IgM  . Neuromuscular disorder (HCC)    neuropathy in feet  . Obstructive sleep apnea    on C Pap  . Pacemaker 12/15/06   3'08  . Thyroid disease   . TMJ tenderness 01-11-13   right side-has issues from time to time.    Patient Active Problem List   Diagnosis Date Noted  . Pain due to onychomycosis of toenails of both feet 05/04/2019  . Neuropathy 05/04/2019  . Peripheral polyneuropathy 12/23/2017  . Tremor 12/23/2017  . Alcohol abuse 12/23/2017  . NSVT  (nonsustained ventricular tachycardia) ([place]) 12/24/2016  . CHB (complete heart block) ([place]) 09/11/2015  . Pacemaker battery depletion 09/11/2015  [name] 07/10/2015  . Pacemaker 07/02/2013  . CAD (coronary artery disease) of artery bypass graft 02/16/2013  . HTN (hypertension) 02/16/2013  . Hyperlipidemia 02/16/2013  . Postoperative anemia due to acute blood loss 01/20/2013  . Postop Hyponatremia 01/18/2013  . OA (osteoarthritis) of knee 01/17/2013    Past Surgical History:  Procedure Laterality Date  . APPENDECTOMY    . CARDIAC CATHETERIZATION  03/28/08   patent grafts, nl EF  . CARPAL TUNNEL RELEASE Right 2018  . CERVICAL LAMINECTOMY    . CORONARY ARTERY BYPASS GRAFT  2001   4 vessels-'00  . EP IMPLANTABLE DEVICE N/A 09/11/2015   Procedure: PPM Generator Changeout;  Surgeon: Sanda Klein, MD;  Location: [facility];  Service: Cardiovascular;  Laterality: N/A;  . HERNIA REPAIR    . INSERT / REPLACE / REMOVE PACEMAKER     '08-inserted left chest  . JOINT REPLACEMENT     LTHA  . NM MYOCAR PERF WALL MOTION  02/12/2012   small fixed distal anteroapical/apical defect. No reversible ischemia  . PACEMAKER INSERTION  12/15/06   medtronic  . TONSILLECTOMY     child  . TOTAL KNEE ARTHROPLASTY Right 01/17/2013  Procedure: RIGHT TOTAL KNEE ARTHROPLASTY;  Surgeon: Gearlean Alf, MD;  Location: [facility];  Service: Orthopedics;  Laterality: Right;  . US ECHOCARDIOGRAPHY  12/28/2007   LA mod. dilated,RA mildly dilated       Family History  Problem Relation Age of Onset  . Stroke Other        Grandfather   . Neuropathy Neg Hx     Social History   Tobacco Use  . Smoking status: Former Smoker    Quit date: 01/12/1984    Years since quitting: 35.7  . Smokeless tobacco: Never Used  Substance Use Topics  . Alcohol use: Yes    Alcohol/week: 3.0 standard drinks    Types: 3 Shots of liquor per week    Comment: 2-3 drinks daily  .  Drug use: No    Home Medications Prior to Admission medications   Medication Sig Start Date End Date Taking? Authorizing Provider  atorvastatin (LIPITOR) 40 MG tablet Take 40 mg by mouth every evening.     [provider]  clonazePAM (KLONOPIN) 0.5 MG tablet Take 0.25-0.5 mg by mouth 3 (three) times daily as needed for anxiety. Takes 1/2 tablet twice daily then a whole tablet at bedtime    [provider]  doxazosin (CARDURA) 2 MG tablet Take 1 mg by mouth at bedtime.    [provider]  ELIQUIS 5 MG TABS tablet TAKE ONE TABLET TWICE DAILY 10/04/19   Lorretta Harp, MD  ezetimibe (ZETIA) 10 MG tablet Take 10 mg by mouth every evening.     [provider]  ferrous sulfate 325 (65 FE) MG tablet Take 325 mg by mouth every other Chyenne Sobczak. On Monday, Wednesday and Friday    [provider]  furosemide (LASIX) 20 MG tablet Take 20 mg by mouth daily.     [provider]  gabapentin (NEURONTIN) 100 MG capsule TAKE 1 CAPSULE THREE TIMES A Vasti Yagi (MUST HAVE APPOINTMENT FOR FURTHER REFILLS) 06/13/19   Melvenia Beam, MD  [facility] PAIN RELIEF PATCH EX) Apply 0.4 % topically as needed.    [provider]  losartan (COZAAR) 100 MG tablet Take 100 mg by mouth daily.    [provider]  metoprolol (LOPRESSOR) 100 MG tablet Take 100 mg by mouth 2 (two) times daily.    [provider]  NON FORMULARY CPAP qhs    [provider]  omeprazole (PRILOSEC) 40 MG capsule Take 20 mg by mouth daily.     [provider]  prednisoLONE acetate (PRED FORTE) 1 % ophthalmic suspension  03/28/19   [provider]  Skin Protectants, Misc. (EUCERIN) cream Apply 1 application topically daily as needed for dry skin.    [provider]  SYNTHROID 100 MCG tablet  04/19/19   [provider]  traMADol (ULTRAM) 50 MG tablet Take 50 mg by mouth as needed.    [provider]    Allergies      Mefloquine and Lisinopril  Review of Systems   Review of Systems  Constitutional: Negative for chills and fever.  HENT: Negative for congestion, ear pain and sore throat.   Eyes: Negative for pain, discharge, redness and visual disturbance.  Respiratory: Negative for cough and shortness of breath.   Cardiovascular: Positive for leg swelling. Negative for chest pain and palpitations.  Gastrointestinal: Negative for abdominal pain, diarrhea, nausea and vomiting.  Genitourinary: Negative for dysuria, frequency and hematuria.  Musculoskeletal: Positive for gait problem. Negative for arthralgias,  back pain and neck stiffness.       Left lower extremity weakness from hip to his left calf  Skin: Negative for color change and rash.  Neurological: Negative for seizures, syncope and weakness.  Psychiatric/Behavioral: Negative for agitation.  All other systems reviewed and are negative.   Physical Exam Updated Vital Signs BP (!) 147/68   Pulse 77   Temp 98.3 F (36.8 C) (Oral)   Resp 20   Ht 5\' 11"  (1.803 m)   Wt 113.4 kg   SpO2 100%   BMI 34.87 kg/m   Physical Exam Vitals and nursing note reviewed.  Constitutional:      General: He is not in acute distress.    Appearance: Normal appearance. He is well-developed. He is not ill-appearing.  HENT:     Head: Normocephalic and atraumatic.     Comments: No signs of head trauma, tenderness, step offs or deformities    Right Ear: External ear normal.     Left Ear: External ear normal.     Nose: Nose normal. No congestion.     Mouth/Throat:     Mouth: Mucous membranes are moist.  Eyes:     General:        Right eye: No discharge.        Left eye: No discharge.     Conjunctiva/sclera: Conjunctivae normal.  Cardiovascular:     Rate and Rhythm: Normal rate and regular rhythm.     Pulses: Normal pulses.     Heart sounds: Normal heart sounds. No murmur.  Pulmonary:     Effort: Pulmonary effort is normal. No respiratory distress.      Breath sounds: Normal breath sounds. No wheezing or rales.  Abdominal:     General: Abdomen is flat. There is no distension.     Palpations: Abdomen is soft.     Tenderness: There is no abdominal tenderness.  Musculoskeletal:        General: Swelling present. No signs of injury. Normal range of motion.     Cervical back: Normal range of motion and neck supple.     Comments: Severe stasis dermatitis to BLE with bilateral 2+ pitting edema  Skin:    General: Skin is warm and dry.     Capillary Refill: Capillary refill takes less than 2 seconds.  Neurological:     General: No focal deficit present.     Mental Status: He is alert and oriented to person, place, and time.     Cranial Nerves: No cranial nerve deficit.     Sensory: No sensory deficit.     Motor: Weakness present.     Coordination: Coordination normal.     Gait: Gait abnormal.     Comments: Awake, alert, and oriented x4 Gait abnormal  Finger to nose intact Plantar response No pronator drift Sensation intact to LT Strength 2/5 LLE Strength 5/5 RLE and BUE   CRANIAL NERVES: 2 (Optic)-PERRLA. VF intact to confrontation. 3/4/6 (Oculomotor, Trochlear, Abudcens)- EOMI 5 (Trigeminal)-Sensation intact topinprick 7 (Facial)-Symmetric facial expression 8 (Vestibulococchlear)-Responds to voice 9/10 (Glossopharyngeal)-Symmetric palate and uvula elevation 11 (Spinal accessory)-Head midline 12 (Hypoglossal)-Tongue Midline   Psychiatric:        Mood and Affect: Mood normal.        Behavior: Behavior normal.     ED Results / Procedures / Treatments   Labs (all labs ordered are listed, but only abnormal results are displayed) Labs Reviewed  BASIC METABOLIC PANEL - Abnormal; Notable for the following components:  Result Value   Glucose, Bld 119 (*)    BUN 28 (*)    Creatinine, Ser 1.42 (*)    GFR calc non Af Amer 46 (*)    GFR calc Af Amer 53 (*)    All other components within normal limits  CBC -  Abnormal; Notable for the following components:   RBC 3.80 (*)    MCV 112.9 (*)    MCH 36.3 (*)    Platelets 99 (*)    All other components within normal limits  URINALYSIS, ROUTINE W REFLEX MICROSCOPIC - Abnormal; Notable for the following components:   Color, Urine AMBER (*)    Protein, ur 100 (*)    All other components within normal limits  SARS CORONAVIRUS 2 (TAT 6-24 HRS)  CBG MONITORING, ED    EKG None  Radiology DG Tibia/Fibula Left  Result Date: 10/13/2019 CLINICAL DATA:  Leg weakness EXAM: LEFT TIBIA AND FIBULA - 2 VIEW COMPARISON:  None. FINDINGS: No fracture or malalignment. Vascular calcifications. Diffuse edema within the lower leg. Ossicle or old fracture at the medial malleolus. Advanced arthritis medial joint space of the knee. IMPRESSION: No acute osseous abnormality. Electronically Signed   By: Donavan Foil M.D.   On: 10/13/2019 19:20   CT HEAD WO CONTRAST  Result Date: 10/14/2019 CLINICAL DATA:  Frequent falls, multiple in MS assessment yesterday EXAM: CT HEAD WITHOUT CONTRAST TECHNIQUE: Contiguous axial images were obtained from the base of the skull through the vertex without intravenous contrast. COMPARISON:  CT 12/17/2012 FINDINGS: Brain: No evidence of acute infarction, hemorrhage, hydrocephalus, extra-axial collection or mass lesion/mass effect. Symmetric prominence of the ventricles, cisterns and sulci compatible with parenchymal volume loss. Confluent areas of white matter hypoattenuation are most compatible with chronic microvascular angiopathy. Senescent mineralization of the basal ganglia predominantly within the globus pallidi, a benign incidental finding. Vascular: Atherosclerotic calcification of the carotid siphons and intradural vertebral arteries. No hyperdense vessel. Skull: No significant scalp swelling or calvarial fracture. No suspicious osseous lesions. Sinuses/Orbits: Minimal mucosal thickening in the maxillary sinuses. Remainder of the paranasal  sinuses and mastoid air cells are predominantly clear. Orbital structures are unremarkable aside from prior lens extractions. Other: Debris present within the external auditory canals. Mild bilateral TMJ arthrosis, left slightly greater than right. IMPRESSION: 1. No acute intracranial abnormality. 2. Advanced parenchymal volume loss and chronic microvascular angiopathy, similar to prior. 3. Debris in the external auditory canals, consider assessment for cerumen impaction. Electronically Signed   By: Lovena Le M.D.   On: 10/14/2019 06:27   VAS [place] LOWER EXTREMITY VENOUS (DVT) (ONLY [facility] & [facility])  Result Date: 10/14/2019  Lower Venous Study Indications: Swelling.  Limitations: Body habitus and poor ultrasound/tissue interface. Comparison Study: Lower extremity venous duplex 05-20-16 negative. Performing Technologist: Baldwin Crown ARDMS, RVT  Examination Guidelines: A complete evaluation includes B-mode imaging, spectral Doppler, color Doppler, and power Doppler as needed of all accessible portions of each vessel. Bilateral testing is considered an integral part of a complete examination. Limited examinations for reoccurring indications may be performed as noted.  +---------+---------------+---------+-----------+----------+--------------+ RIGHT    CompressibilityPhasicitySpontaneityPropertiesThrombus Aging +---------+---------------+---------+-----------+----------+--------------+ CFV      Full           Yes      Yes                                 +---------+---------------+---------+-----------+----------+--------------+ SFJ      Full                                                        +---------+---------------+---------+-----------+----------+--------------+  FV Prox  Full                                                        +---------+---------------+---------+-----------+----------+--------------+ FV Mid   Full                                                         +---------+---------------+---------+-----------+----------+--------------+ FV DistalFull                                                        +---------+---------------+---------+-----------+----------+--------------+ PFV      Full                                                        +---------+---------------+---------+-----------+----------+--------------+ POP      Full           Yes      Yes                                 +---------+---------------+---------+-----------+----------+--------------+ PTV      Full                                                        +---------+---------------+---------+-----------+----------+--------------+ PERO     Full                                                        +---------+---------------+---------+-----------+----------+--------------+   +---------+---------------+---------+-----------+----------+--------------+ LEFT     CompressibilityPhasicitySpontaneityPropertiesThrombus Aging +---------+---------------+---------+-----------+----------+--------------+ CFV      Full           Yes      Yes                                 +---------+---------------+---------+-----------+----------+--------------+ SFJ      Full                                                        +---------+---------------+---------+-----------+----------+--------------+ FV Prox  Full                                                        +---------+---------------+---------+-----------+----------+--------------+  FV Mid   Full                                                        +---------+---------------+---------+-----------+----------+--------------+ FV DistalFull                                                        +---------+---------------+---------+-----------+----------+--------------+ PFV      Full                                                         +---------+---------------+---------+-----------+----------+--------------+ POP      Full           Yes      Yes                                 +---------+---------------+---------+-----------+----------+--------------+ PTV      Full                                                        +---------+---------------+---------+-----------+----------+--------------+ PERO     Full                                                        +---------+---------------+---------+-----------+----------+--------------+ Limited visualization of bilateral lower extremity veins due to patient body habitus and swelling.    Summary: Right: There is no evidence of deep vein thrombosis in the lower extremity. No cystic structure found in the popliteal fossa. Left: There is no evidence of deep vein thrombosis in the lower extremity. No cystic structure found in the popliteal fossa.  *See table(s) above for measurements and observations.    Preliminary     Procedures Procedures (including critical care time)  Medications Ordered in ED Medications  sodium chloride flush (NS) 0.9 % injection 3 mL (has no administration in time range)  LORazepam (ATIVAN) injection 1 mg (1 mg Intravenous Not Given 10/13/19 2343)  atorvastatin (LIPITOR) tablet 40 mg (has no administration in time range)  apixaban (ELIQUIS) tablet 5 mg (5 mg Oral Given 10/14/19 1003)  doxazosin (CARDURA) tablet 1 mg (has no administration in time range)  clonazePAM (KLONOPIN) disintegrating tablet 0.25-0.5 mg (has no administration in time range)  ezetimibe (ZETIA) tablet 10 mg (has no administration in time range)  furosemide (LASIX) tablet 20 mg (20 mg Oral Given 10/14/19 1003)  gabapentin (NEURONTIN) capsule 100 mg (100 mg Oral Given 10/14/19 1003)  metoprolol tartrate (LOPRESSOR) tablet 100 mg (100 mg Oral Given 10/14/19 1003)  pantoprazole (PROTONIX) EC tablet 40 mg (40 mg Oral Given 10/14/19 1003)  levothyroxine (SYNTHROID) tablet 100 mcg (100  mcg Oral Given 10/14/19 0824)  lactated ringers bolus 1,000 mL (0 mLs Intravenous Stopped 10/13/19 2334)    ED Course  I have reviewed the triage vital signs and the nursing notes.  Pertinent labs & imaging results that were available during my care of the patient were reviewed by me and considered in my medical decision making (see chart for details).    MDM Rules/Calculators/A&P                      SADIKI LOLLIE is a 85 y.o. male with above medical history who presents to the ED for frequent falls and concern of left lower leg weakness.  He reports yesterday having 3 falls yesterday at home which he attributes to new onset left lower extremity weakness.  He denies any injury from his fall, did not hit his head, no LOC.  He takes no blood thinners.  He denies acute pain of his left lower extremity, does have some chronic pain of his left lower extremity.  He has severe stasis dermatitis of both his lower extremities.  He denies any numbness of extremities, recent illness, fever, cough, chest pain, shortness of breath.  HPI and physical exam as above. He presents awake, alert, hemodynamically stable, afebrile, non toxic. Gait abnormal secondary to LLE weakness. Severe stasis dermatitis to BLE with bilateral 2+ pitting edema. No pronator drift, sensation intact to LT throughout. Strength 2/5 LLE, strength 5/5 RLE and BUE. No signs of head trauma, tenderness, step offs or deformities  Exam is reassuring that he does not have traumatic injury contributing this left leg weakness. Low suspicion for dvt (equal size ble with no tenderness, he does have baseline erythema and chronic venous stasis). He has weakness from hip flexors to left calf with normal sensation. His c/t and l spine are non tender without stepoff or deformity. Stroke is in the differential, will plan to obtain mri of his brain and lumbar spine to rule out stroke and nerve compression.  Pt care was handed off. Complete history and  physical and current plan have been communicated.  Please refer to their note for the remainder of ED care and ultimate disposition. The above statements and care plan were discussed with my attending physician Dr Vanita Panda who voiced agreement with plan.   Final Clinical Impression(s) / ED Diagnoses Final diagnoses:  Pain in joint of left hip  Renal insufficiency  Macrocytosis without anemia    Rx / DC Orders ED Discharge Orders    None       Evelyn Moch, Lovena Le, MD 10/14/19 1552    Carmin Muskrat, MD 10/15/19 2336

## 2019-10-14 ENCOUNTER — Emergency Department (HOSPITAL_COMMUNITY): Payer: Medicare Other

## 2019-10-14 ENCOUNTER — Other Ambulatory Visit: Payer: Self-pay

## 2019-10-14 DIAGNOSIS — I442 Atrioventricular block, complete: Secondary | ICD-10-CM

## 2019-10-14 DIAGNOSIS — J9601 Acute respiratory failure with hypoxia: Secondary | ICD-10-CM

## 2019-10-14 DIAGNOSIS — M25552 Pain in left hip: Secondary | ICD-10-CM

## 2019-10-14 DIAGNOSIS — M7989 Other specified soft tissue disorders: Secondary | ICD-10-CM

## 2019-10-14 DIAGNOSIS — I25810 Atherosclerosis of coronary artery bypass graft(s) without angina pectoris: Secondary | ICD-10-CM

## 2019-10-14 DIAGNOSIS — R269 Unspecified abnormalities of gait and mobility: Secondary | ICD-10-CM

## 2019-10-14 DIAGNOSIS — I10 Essential (primary) hypertension: Secondary | ICD-10-CM | POA: Diagnosis not present

## 2019-10-14 DIAGNOSIS — E785 Hyperlipidemia, unspecified: Secondary | ICD-10-CM

## 2019-10-14 DIAGNOSIS — D472 Monoclonal gammopathy: Secondary | ICD-10-CM

## 2019-10-14 LAB — CBC WITH DIFFERENTIAL/PLATELET
Abs Immature Granulocytes: 0.01 10*3/uL (ref 0.00–0.07)
Basophils Absolute: 0 10*3/uL (ref 0.0–0.1)
Basophils Relative: 1 %
Eosinophils Absolute: 0.2 10*3/uL (ref 0.0–0.5)
Eosinophils Relative: 3 %
HCT: 40 % (ref 39.0–52.0)
Hemoglobin: 13.3 g/dL (ref 13.0–17.0)
Immature Granulocytes: 0 %
Lymphocytes Relative: 18 %
Lymphs Abs: 1.1 10*3/uL (ref 0.7–4.0)
MCH: 36.9 pg — ABNORMAL HIGH (ref 26.0–34.0)
MCHC: 33.3 g/dL (ref 30.0–36.0)
MCV: 111.1 fL — ABNORMAL HIGH (ref 80.0–100.0)
Monocytes Absolute: 0.8 10*3/uL (ref 0.1–1.0)
Monocytes Relative: 13 %
Neutro Abs: 4 10*3/uL (ref 1.7–7.7)
Neutrophils Relative %: 65 %
Platelets: 105 10*3/uL — ABNORMAL LOW (ref 150–400)
RBC: 3.6 MIL/uL — ABNORMAL LOW (ref 4.22–5.81)
RDW: 14.8 % (ref 11.5–15.5)
WBC: 6.1 10*3/uL (ref 4.0–10.5)
nRBC: 0 % (ref 0.0–0.2)

## 2019-10-14 LAB — COMPREHENSIVE METABOLIC PANEL
ALT: 17 U/L (ref 0–44)
AST: 41 U/L (ref 15–41)
Albumin: 2.8 g/dL — ABNORMAL LOW (ref 3.5–5.0)
Alkaline Phosphatase: 164 U/L — ABNORMAL HIGH (ref 38–126)
Anion gap: 9 (ref 5–15)
BUN: 22 mg/dL (ref 8–23)
CO2: 29 mmol/L (ref 22–32)
Calcium: 8.5 mg/dL — ABNORMAL LOW (ref 8.9–10.3)
Chloride: 99 mmol/L (ref 98–111)
Creatinine, Ser: 1.37 mg/dL — ABNORMAL HIGH (ref 0.61–1.24)
GFR calc Af Amer: 55 mL/min — ABNORMAL LOW (ref >60)
GFR calc non Af Amer: 48 mL/min — ABNORMAL LOW (ref >60)
Glucose, Bld: 113 mg/dL — ABNORMAL HIGH (ref 70–99)
Potassium: 4.6 mmol/L (ref 3.5–5.1)
Sodium: 137 mmol/L (ref 135–145)
Total Bilirubin: 3.4 mg/dL — ABNORMAL HIGH (ref 0.3–1.2)
Total Protein: 6.3 g/dL — ABNORMAL LOW (ref 6.5–8.1)

## 2019-10-14 LAB — BRAIN NATRIURETIC PEPTIDE: B Natriuretic Peptide: 431.5 pg/mL — ABNORMAL HIGH (ref 0.0–100.0)

## 2019-10-14 LAB — SARS CORONAVIRUS 2 (TAT 6-24 HRS): SARS Coronavirus 2: NEGATIVE

## 2019-10-14 MED ORDER — DOXAZOSIN MESYLATE 1 MG PO TABS
1.0000 mg | ORAL_TABLET | Freq: Every day | ORAL | Status: DC
  Administered 2019-10-15 – 2019-10-18 (×4): 1 mg via ORAL
  Filled 2019-10-14 (×6): qty 1

## 2019-10-14 MED ORDER — FUROSEMIDE 10 MG/ML IJ SOLN: 20.0000 mg | Freq: Every day | INTRAMUSCULAR | Status: DC

## 2019-10-14 MED ORDER — PANTOPRAZOLE SODIUM 40 MG PO TBEC
40.0000 mg | DELAYED_RELEASE_TABLET | Freq: Every day | ORAL | Status: DC
  Administered 2019-10-14 – 2019-10-19 (×6): 40 mg via ORAL
  Filled 2019-10-14 (×6): qty 1

## 2019-10-14 MED ORDER — FUROSEMIDE 10 MG/ML IJ SOLN: 40.0000 mg | Freq: Once | INTRAMUSCULAR | Status: DC

## 2019-10-14 MED ORDER — ATORVASTATIN CALCIUM 40 MG PO TABS
40.0000 mg | ORAL_TABLET | Freq: Every evening | ORAL | Status: DC
  Administered 2019-10-15 – 2019-10-18 (×4): 40 mg via ORAL
  Filled 2019-10-14 (×4): qty 1

## 2019-10-14 MED ORDER — GABAPENTIN 100 MG PO CAPS
100.0000 mg | ORAL_CAPSULE | Freq: Three times a day (TID) | ORAL | Status: DC
  Administered 2019-10-14 – 2019-10-19 (×16): 100 mg via ORAL
  Filled 2019-10-14 (×16): qty 1

## 2019-10-14 MED ORDER — ONDANSETRON HCL 4 MG PO TABS: 4.0000 mg | ORAL_TABLET | Freq: Four times a day (QID) | ORAL | Status: DC | PRN

## 2019-10-14 MED ORDER — FUROSEMIDE 10 MG/ML IJ SOLN: 20.0000 mg | Freq: Once | INTRAMUSCULAR | Status: DC

## 2019-10-14 MED ORDER — TRAMADOL HCL 50 MG PO TABS
50.0000 mg | ORAL_TABLET | Freq: Two times a day (BID) | ORAL | Status: DC | PRN
  Administered 2019-10-16: 22:00:00 50 mg via ORAL
  Filled 2019-10-14: qty 1

## 2019-10-14 MED ORDER — LEVOTHYROXINE SODIUM 100 MCG PO TABS
100.0000 ug | ORAL_TABLET | Freq: Every day | ORAL | Status: DC
  Administered 2019-10-14 – 2019-10-19 (×5): 100 ug via ORAL
  Filled 2019-10-14 (×6): qty 1

## 2019-10-14 MED ORDER — EZETIMIBE 10 MG PO TABS
10.0000 mg | ORAL_TABLET | Freq: Every evening | ORAL | Status: DC
  Administered 2019-10-15 – 2019-10-18 (×4): 10 mg via ORAL
  Filled 2019-10-14 (×5): qty 1

## 2019-10-14 MED ORDER — ACETAMINOPHEN 325 MG PO TABS: 650.0000 mg | ORAL_TABLET | Freq: Four times a day (QID) | ORAL | Status: DC | PRN

## 2019-10-14 MED ORDER — METOPROLOL TARTRATE 100 MG PO TABS
100.0000 mg | ORAL_TABLET | Freq: Two times a day (BID) | ORAL | Status: DC
  Administered 2019-10-14 – 2019-10-18 (×8): 100 mg via ORAL
  Filled 2019-10-14 (×2): qty 1
  Filled 2019-10-14: qty 4
  Filled 2019-10-14 (×7): qty 1

## 2019-10-14 MED ORDER — APIXABAN 5 MG PO TABS
5.0000 mg | ORAL_TABLET | Freq: Two times a day (BID) | ORAL | Status: DC
  Administered 2019-10-14 – 2019-10-19 (×11): 5 mg via ORAL
  Filled 2019-10-14 (×12): qty 1

## 2019-10-14 MED ORDER — ONDANSETRON HCL 4 MG/2ML IJ SOLN: 4.0000 mg | Freq: Four times a day (QID) | INTRAMUSCULAR | Status: DC | PRN

## 2019-10-14 MED ORDER — CLONAZEPAM 0.125 MG PO TBDP: 0.2500 mg | ORAL_TABLET | Freq: Three times a day (TID) | ORAL | Status: DC | PRN

## 2019-10-14 MED ORDER — FUROSEMIDE 20 MG PO TABS
20.0000 mg | ORAL_TABLET | Freq: Every day | ORAL | Status: DC
  Administered 2019-10-14: 20 mg via ORAL
  Filled 2019-10-14: qty 1

## 2019-10-14 MED ORDER — ACETAMINOPHEN 650 MG RE SUPP: 650.0000 mg | Freq: Four times a day (QID) | RECTAL | Status: DC | PRN

## 2019-10-14 NOTE — Consult Note (Signed)
Neurology Consultation Reason for Consult: Falls Referring Physician: Roxanne Mins committee  CC: Falls  History is obtained from: Patient  HPI: Mike Price is a 85 y.o. male with a history of hypertension, hyperlipidemia, A. fib on Eliquis who presents with falls.  He had 3 falls prompting him to call his PCP who referred him into the emergency department.  He provided a nice summary in a "hospitalist plan of care" note that was very much appreciated.  The patient states that he has had progressive difficulty with walking over the past 6 months.  He has had problems with lower extremity edema and he has chronic left knee and left hip pain which contributes to his difficulty with walking.  And in his initial evaluation, it was noted that he was slightly worse on his left than his right and therefore there was concern for stroke.  An MRI was ordered, however this was not able to be performed due to his pacemaker.  The patient is clear, however, that this is a chronic problem that has simply progressed to the point where he is no longer able to compensate as opposed to acute or even acute on chronic.  There was also some concern for mental status changes at home, but he appears to be relatively clear for me today.  He has been slightly more short of breath than typical lately.  LKW: 6 months ago tpa given?: no, outside of window   ROS: A 14 point ROS was performed and is negative except as noted in the HPI.   Past Medical History:  Diagnosis Date  . Anemia   . Anxiety 01-11-13   tx. Clonazepam.  . Arthritis    Osteoarthritis-knees, fingers  . CAD (coronary artery disease)   . Diverticulitis   . Dysrhythmia 01-11-13    hx. A. Fib-Pacemaker implanted left chest  . Edema extremities 01-11-13   retains fluid in legs-right greater than left.  [name] Kitchen GERD (gastroesophageal reflux disease)   . Hyperlipemia   . Hypertension   . Hypothyroidism   [name]  07/10/2015   IgA 542 mg% 06/19/15. IgA lambda paraprotein on IFE, normal IgG & IgM  . Neuromuscular disorder (HCC)    neuropathy in feet  . Obstructive sleep apnea    on C Pap  . Pacemaker 12/15/06   3'08  . Thyroid disease   . TMJ tenderness 01-11-13   right side-has issues from time to time.     Family History  Problem Relation Age of Onset  . Stroke Other        Grandfather   . Neuropathy Neg Hx      Social History:  reports that he quit smoking about 35 years ago. He has never used smokeless tobacco. He reports current alcohol use of about 3.0 standard drinks of alcohol per week. He reports that he does not use drugs.   Exam: Current vital signs: BP (!) 143/70   Pulse 88   Temp 98.3 F (36.8 C) (Oral)   Resp (!) 21   Ht 5\' 11"  (1.803 m)   Wt 113.4 kg   SpO2 98%   BMI 34.87 kg/m  Vital signs in last 24 hours: Temp:  [97.8 F (36.6 C)-98.3 F (36.8 C)] 98.3 F (36.8 C) (01/07 2339) Pulse Rate:  [69-88] 88 (01/08 0530) Resp:  [16-24] 21 (01/08 0530) BP: (123-147)/(70-101) 143/70 (01/08 0530) SpO2:  [91 %-100 %] 98 % (01/08 0530) Weight:  [113.4 kg] 113.4 kg (  01/08 0342)   Physical Exam  Constitutional: Appears well-developed and well-nourished.  Psych: Affect appropriate to situation Eyes: No scleral injection HENT: No OP obstrucion MSK: no joint deformities.  Cardiovascular: Normal rate and regular rhythm.  Respiratory: Effort normal, non-labored breathing GI: Soft.  No distension. There is no tenderness.  Skin: Extensive venous stasis changes bilaterally  Neuro: Mental Status: Patient is awake, alert, oriented to person, place, month, year, and situation. Patient is able to give a clear and coherent history. No signs of aphasia or neglect Cranial Nerves: II: Visual Fields are full. Pupils are equal, round, and reactive to light.   III,IV, VI: EOMI without ptosis or diploplia.  V: Facial sensation is symmetric to temperature VII: Facial movement is  symmetric.  VIII: hearing is intact to voice X: Uvula elevates symmetrically XI: Shoulder shrug is symmetric. XII: tongue is midline without atrophy or fasciculations.  Motor: Tone is normal. Bulk is normal.  His right arm is limited to movements at the shoulder due to rotator cuff injuries, but distally he has 5/5 strength, 5/5 strength in the left arm, 5/5 strength in the right lower extremity, and the left leg he has limited hip extension and knee extension due to pain, but is at least 4/5, also limited hip adduction due to pain but also at least 4/5.  He has 5/5 knee flexion, dorsi and plantar flexion. Sensory: Sensation is diminished to light touch to the mid calf, but symmetric Deep Tendon Reflexes: He has absent ankle reflexes bilaterally, 2+ and symmetric knee reflexes Cerebellar: He has intentional tremor bilaterally.   I have reviewed labs in epic and the results pertinent to this consultation are: Creatinine 1.42   Impression: 85 year old male with chronic gait dysfunction progressing to the point where he is no longer compensating.  I suspect that this is multifactorial including edema and deconditioning.  I do think given the extensive venous stasis changes, DVT is a consideration, however he is already on Eliquis.  Given his anticoagulation I do think that a CT of his head would be prudent, also given the duration of his symptoms, I would expect ischemic stroke to likely be present on there as well.  If this is negative, then I would not perform any further neurological evaluation.  Recommendations: 1) CT head-if negative no further neurological work-up 2) evaluation for DVT, other medical issues per ER   Roland Rack, MD [facility] [phone]  If 7pm- 7am, please page neurology on call as listed in [place].

## 2019-10-14 NOTE — Care Management (Signed)
    Durable Medical Equipment  (From admission, onward)         Start     Ordered   10/14/19 1653  For home use only DME lightweight manual wheelchair with seat cushion  Once    Comments: Patient suffers from weakness which impairs their ability to perform daily activities like  in the home.  A walker will not resolve  issue with performing activities of daily living. A wheelchair will allow patient to safely perform daily activities. Patient is not able to propel themselves in the home using a standard weight wheelchair due to weakness. Patient can self propel in the lightweight wheelchair. Length of need 6 months Accessories: elevating leg rests (ELRs), wheel locks, extensions and anti-tippers.   10/14/19 1655   10/14/19 1221  For home use only DME 3 n 1  Once    Comments: With elongated seat   10/14/19 1226

## 2019-10-14 NOTE — ED Provider Notes (Signed)
85 yo M with a cc of frequent falls.  Assumed care at 1500.  No obvious reason for admission initially so evaluated by social work.  In process found to by hypoxic with ambulation down to 81%.  Now on 3L O2.  ? Worsening fluid overload while in the ED.  Given dose lasix.  Covid -.  Discuss with hospitalist.   CRITICAL CARE Performed by: Cecilio Asper   Total critical care time: 35 minutes  Critical care time was exclusive of separately billable procedures and treating other patients.  Critical care was necessary to treat or prevent imminent or life-threatening deterioration.  Critical care was time spent personally by me on the following activities: development of treatment plan with patient and/or surrogate as well as nursing, discussions with consultants, evaluation of patient's response to treatment, examination of patient, obtaining history from patient or surrogate, ordering and performing treatments and interventions, ordering and review of laboratory studies, ordering and review of radiographic studies, pulse oximetry and re-evaluation of patient's condition.      Deno Etienne, DO 10/14/19 1641

## 2019-10-14 NOTE — ED Notes (Signed)
Patient transported to X-ray 

## 2019-10-14 NOTE — H&P (Addendum)
History and Physical    Mike Price [id number] DOB: 1935/02/07 DOA: 10/13/2019  PCP: Sueanne Margarita, DO Patient coming from: Home  Chief Complaint: Respiratory failure  HPI: Mike Price is a 85 y.o. male with medical history significant of CAD s/p CABG, complete heart block s/p pacemaker, atrial fibrillation, hypothyroidism, hypertension, MGUS. Patient initially presented to the ED secondary to frequent falls from his left leg "giving out". Patient underwent workup for possible stroke which was negative. Venous duplex was negative as well. While in the ED, patient received a 1L bolus of LR. Today, while ambulating with physical therapy, patient had dyspnea with associated hypoxia requiring 2 L of oxygen via nasal canula.  ED Course: Vitals: Afebrile, normal pulse and respirations. Normotensive. On 2 L via [place] Labs: Pending today; Creatinine of 1.42 and platelets of 99 from yesterday Imaging: Chest x-ray significant for interstitial edema/vascular congestion and cardiomegaly Medications/Course: Home medications  Review of Systems: Review of Systems  Constitutional: Negative for fever.  Respiratory: Negative for cough and shortness of breath.   Cardiovascular: Negative for chest pain.  Gastrointestinal: Negative for abdominal pain, constipation, diarrhea, nausea and vomiting.  All other systems reviewed and are negative.   Past Medical History:  Diagnosis Date  . Anemia   . Anxiety 01-11-13   tx. Clonazepam.  . Arthritis    Osteoarthritis-knees, fingers  . CAD (coronary artery disease)   . Diverticulitis   . Dysrhythmia 01-11-13    hx. A. Fib-Pacemaker implanted left chest  . Edema extremities 01-11-13   retains fluid in legs-right greater than left.  [name] Kitchen GERD (gastroesophageal reflux disease)   . Hyperlipemia   . Hypertension   . Hypothyroidism   [name] 07/10/2015   IgA 542 mg% 06/19/15. IgA lambda paraprotein on IFE, normal  IgG & IgM  . Neuromuscular disorder (HCC)    neuropathy in feet  . Obstructive sleep apnea    on C Pap  . Pacemaker 12/15/06   3'08  . Thyroid disease   . TMJ tenderness 01-11-13   right side-has issues from time to time.    Past Surgical History:  Procedure Laterality Date  . APPENDECTOMY    . CARDIAC CATHETERIZATION  03/28/08   patent grafts, nl EF  . CARPAL TUNNEL RELEASE Right 2018  . CERVICAL LAMINECTOMY    . CORONARY ARTERY BYPASS GRAFT  2001   4 vessels-'00  . EP IMPLANTABLE DEVICE N/A 09/11/2015   Procedure: PPM Generator Changeout;  Surgeon: Sanda Klein, MD;  Location: [facility];  Service: Cardiovascular;  Laterality: N/A;  . HERNIA REPAIR    . INSERT / REPLACE / REMOVE PACEMAKER     '08-inserted left chest  . JOINT REPLACEMENT     LTHA  . NM MYOCAR PERF WALL MOTION  02/12/2012   small fixed distal anteroapical/apical defect. No reversible ischemia  . PACEMAKER INSERTION  12/15/06   medtronic  . TONSILLECTOMY     child  . TOTAL KNEE ARTHROPLASTY Right 01/17/2013   Procedure: RIGHT TOTAL KNEE ARTHROPLASTY;  Surgeon: Gearlean Alf, MD;  Location: [facility];  Service: Orthopedics;  Laterality: Right;  . US ECHOCARDIOGRAPHY  12/28/2007   LA mod. dilated,RA mildly dilated     reports that he quit smoking about 35 years ago. He has never used smokeless tobacco. He reports current alcohol use of about 3.0 standard drinks of alcohol per week. He reports that he does not use drugs.  Allergies  Allergen Reactions  .  Mefloquine Other (See Comments)    "made me crazy" anxious, upset  . Lisinopril Cough    Family History  Problem Relation Age of Onset  . Stroke Other        Grandfather   . Neuropathy Neg Hx    Prior to Admission medications   Medication Sig Start Date End Date Taking? Authorizing Provider  atorvastatin (LIPITOR) 40 MG tablet Take 40 mg by mouth every evening.     [provider]  clonazePAM (KLONOPIN) 0.5 MG tablet Take 0.25-0.5 mg by  mouth 3 (three) times daily as needed for anxiety. Takes 1/2 tablet twice daily then a whole tablet at bedtime    [provider]  doxazosin (CARDURA) 2 MG tablet Take 1 mg by mouth at bedtime.    [provider]  ELIQUIS 5 MG TABS tablet TAKE ONE TABLET TWICE DAILY 10/04/19   Lorretta Harp, MD  ezetimibe (ZETIA) 10 MG tablet Take 10 mg by mouth every evening.     [provider]  ferrous sulfate 325 (65 FE) MG tablet Take 325 mg by mouth every other day. On Monday, Wednesday and Friday    [provider]  furosemide (LASIX) 20 MG tablet Take 20 mg by mouth daily.     [provider]  gabapentin (NEURONTIN) 100 MG capsule TAKE 1 CAPSULE THREE TIMES A DAY (MUST HAVE APPOINTMENT FOR FURTHER REFILLS) 06/13/19   Melvenia Beam, MD  [facility] PAIN RELIEF PATCH EX) Apply 0.4 % topically as needed.    [provider]  losartan (COZAAR) 100 MG tablet Take 100 mg by mouth daily.    [provider]  metoprolol (LOPRESSOR) 100 MG tablet Take 100 mg by mouth 2 (two) times daily.    [provider]  NON FORMULARY CPAP qhs    [provider]  omeprazole (PRILOSEC) 40 MG capsule Take 20 mg by mouth daily.     [provider]  prednisoLONE acetate (PRED FORTE) 1 % ophthalmic suspension  03/28/19   [provider]  Skin Protectants, Misc. (EUCERIN) cream Apply 1 application topically daily as needed for dry skin.    [provider]  SYNTHROID 100 MCG tablet  04/19/19   [provider]  traMADol (ULTRAM) 50 MG tablet Take 50 mg by mouth as needed.    [provider]    Physical Exam:  Physical Exam Constitutional:      General: He is not in acute distress.    Appearance: He is well-developed. He is not diaphoretic.  Eyes:     Conjunctiva/sclera: Conjunctivae normal.     Pupils: Pupils are equal, round, and reactive to light.  Cardiovascular:     Rate and Rhythm: Normal  rate and regular rhythm.     Heart sounds: Murmur present. Systolic murmur present with a grade of 2/6.  Pulmonary:     Effort: Pulmonary effort is normal. No respiratory distress.     Breath sounds: Rales present. No wheezing.  Abdominal:     General: Bowel sounds are normal. There is no distension.     Palpations: Abdomen is soft.     Tenderness: There is no abdominal tenderness. There is no guarding or rebound.  Musculoskeletal:        General: No tenderness. Normal range of motion.     Cervical back: Normal range of motion.     Right lower leg: 2+ Edema present.     Left lower leg: 2+ Edema present.  Lymphadenopathy:     Cervical: No cervical adenopathy.  Skin:    General: Skin is warm and dry.  Neurological:     Mental Status: He is alert and oriented to person, place, and time.     Labs on Admission: I have personally reviewed following labs and imaging studies  CBC: Recent Labs  Lab 10/13/19 1343  WBC 5.9  HGB 13.8  HCT 42.9  MCV 112.9*  PLT 99*    Basic Metabolic Panel: Recent Labs  Lab 10/13/19 1343  NA 141  K 4.5  CL 103  CO2 31  GLUCOSE 119*  BUN 28*  CREATININE 1.42*  CALCIUM 9.1    GFR: Estimated Creatinine Clearance: 51.3 mL/min (A) (by C-G formula based on SCr of 1.42 mg/dL (H)).  Liver Function Tests: No results for input(s): AST, ALT, ALKPHOS, BILITOT, PROT, ALBUMIN in the last 168 hours. No results for input(s): LIPASE, AMYLASE in the last 168 hours. No results for input(s): AMMONIA in the last 168 hours.  Coagulation Profile: No results for input(s): INR, PROTIME in the last 168 hours.  Cardiac Enzymes: No results for input(s): CKTOTAL, CKMB, CKMBINDEX, TROPONINI in the last 168 hours.  BNP (last 3 results) No results for input(s): PROBNP in the last 8760 hours.  HbA1C: No results for input(s): HGBA1C in the last 72 hours.  CBG: Recent Labs  Lab 10/13/19 1719  GLUCAP 73    Lipid Profile: No results for input(s): CHOL,  HDL, LDLCALC, TRIG, CHOLHDL, LDLDIRECT in the last 72 hours.  Thyroid Function Tests: No results for input(s): TSH, T4TOTAL, FREET4, T3FREE, THYROIDAB in the last 72 hours.  Anemia Panel: No results for input(s): VITAMINB12, FOLATE, FERRITIN, TIBC, IRON, RETICCTPCT in the last 72 hours.  Urine analysis:    Component Value Date/Time   COLORURINE AMBER (A) 10/13/2019 1708   APPEARANCEUR CLEAR 10/13/2019 1708   LABSPEC 1.021 10/13/2019 1708   PHURINE 6.0 10/13/2019 1708   GLUCOSEU NEGATIVE 10/13/2019 1708   HGBUR NEGATIVE 10/13/2019 1708   BILIRUBINUR NEGATIVE 10/13/2019 1708   KETONESUR NEGATIVE 10/13/2019 1708   PROTEINUR 100 (A) 10/13/2019 1708   UROBILINOGEN 1.0 01/11/2013 1100   NITRITE NEGATIVE 10/13/2019 1708   LEUKOCYTESUR NEGATIVE 10/13/2019 1708     Radiological Exams on Admission: DG Chest 2 View  Result Date: 10/14/2019 CLINICAL DATA:  Shortness of breath, difficulty breathing EXAM: CHEST - 2 VIEW COMPARISON:  08/04/2017, CT abdomen and pelvis from 08/16/2019 FINDINGS: Cardiomediastinal contours remain enlarged. Changes of median sternotomy and CABG are redemonstrated. Left-sided dual lead pacer device in place, power pack over left chest. Signs of vascular congestion increased interstitial markings bilaterally. No dense consolidation. No signs of pleural effusion on AP or on lateral view. Visualized skeletal structures are unremarkable. IMPRESSION: 1. Signs of vascular congestion and interstitial edema. 2. Cardiomegaly. Electronically Signed   By: Zetta Bills M.D.   On: 10/14/2019 16:09   DG Tibia/Fibula Left  Result Date: 10/13/2019 CLINICAL DATA:  Leg weakness EXAM: LEFT TIBIA AND FIBULA - 2 VIEW COMPARISON:  None. FINDINGS: No fracture or malalignment. Vascular calcifications. Diffuse edema within the lower leg. Ossicle or old fracture at the medial malleolus. Advanced arthritis medial joint space of the knee. IMPRESSION: No acute osseous abnormality. Electronically  Signed   By: Donavan Foil M.D.   On: 10/13/2019 19:20   CT HEAD WO CONTRAST  Result Date: 10/14/2019 CLINICAL DATA:  Frequent falls, multiple in MS assessment yesterday EXAM: CT HEAD WITHOUT CONTRAST TECHNIQUE: Contiguous axial images were  obtained from the base of the skull through the vertex without intravenous contrast. COMPARISON:  CT 12/17/2012 FINDINGS: Brain: No evidence of acute infarction, hemorrhage, hydrocephalus, extra-axial collection or mass lesion/mass effect. Symmetric prominence of the ventricles, cisterns and sulci compatible with parenchymal volume loss. Confluent areas of white matter hypoattenuation are most compatible with chronic microvascular angiopathy. Senescent mineralization of the basal ganglia predominantly within the globus pallidi, a benign incidental finding. Vascular: Atherosclerotic calcification of the carotid siphons and intradural vertebral arteries. No hyperdense vessel. Skull: No significant scalp swelling or calvarial fracture. No suspicious osseous lesions. Sinuses/Orbits: Minimal mucosal thickening in the maxillary sinuses. Remainder of the paranasal sinuses and mastoid air cells are predominantly clear. Orbital structures are unremarkable aside from prior lens extractions. Other: Debris present within the external auditory canals. Mild bilateral TMJ arthrosis, left slightly greater than right. IMPRESSION: 1. No acute intracranial abnormality. 2. Advanced parenchymal volume loss and chronic microvascular angiopathy, similar to prior. 3. Debris in the external auditory canals, consider assessment for cerumen impaction. Electronically Signed   By: Lovena Le M.D.   On: 10/14/2019 06:27   VAS [place] LOWER EXTREMITY VENOUS (DVT) (ONLY [facility] & [facility])  Result Date: 10/14/2019  Lower Venous Study Indications: Swelling.  Limitations: Body habitus and poor ultrasound/tissue interface. Comparison Study: Lower extremity venous duplex 05-20-16 negative. Performing Technologist: Baldwin Crown ARDMS, RVT  Examination Guidelines: A complete evaluation includes B-mode imaging, spectral Doppler, color Doppler, and power Doppler as needed of all accessible portions of each vessel. Bilateral testing is considered an integral part of a complete examination. Limited examinations for reoccurring indications may be performed as noted.  +---------+---------------+---------+-----------+----------+--------------+ RIGHT    CompressibilityPhasicitySpontaneityPropertiesThrombus Aging +---------+---------------+---------+-----------+----------+--------------+ CFV      Full           Yes      Yes                                 +---------+---------------+---------+-----------+----------+--------------+ SFJ      Full                                                        +---------+---------------+---------+-----------+----------+--------------+ FV Prox  Full                                                        +---------+---------------+---------+-----------+----------+--------------+ FV Mid   Full                                                        +---------+---------------+---------+-----------+----------+--------------+ FV DistalFull                                                        +---------+---------------+---------+-----------+----------+--------------+ PFV      Full                                                        +---------+---------------+---------+-----------+----------+--------------+  POP      Full           Yes      Yes                                 +---------+---------------+---------+-----------+----------+--------------+ PTV      Full                                                        +---------+---------------+---------+-----------+----------+--------------+ PERO     Full                                                        +---------+---------------+---------+-----------+----------+--------------+    +---------+---------------+---------+-----------+----------+--------------+ LEFT     CompressibilityPhasicitySpontaneityPropertiesThrombus Aging +---------+---------------+---------+-----------+----------+--------------+ CFV      Full           Yes      Yes                                 +---------+---------------+---------+-----------+----------+--------------+ SFJ      Full                                                        +---------+---------------+---------+-----------+----------+--------------+ FV Prox  Full                                                        +---------+---------------+---------+-----------+----------+--------------+ FV Mid   Full                                                        +---------+---------------+---------+-----------+----------+--------------+ FV DistalFull                                                        +---------+---------------+---------+-----------+----------+--------------+ PFV      Full                                                        +---------+---------------+---------+-----------+----------+--------------+ POP      Full           Yes      Yes                                 +---------+---------------+---------+-----------+----------+--------------+  PTV      Full                                                        +---------+---------------+---------+-----------+----------+--------------+ PERO     Full                                                        +---------+---------------+---------+-----------+----------+--------------+ Limited visualization of bilateral lower extremity veins due to patient body habitus and swelling.    Summary: Right: There is no evidence of deep vein thrombosis in the lower extremity. No cystic structure found in the popliteal fossa. Left: There is no evidence of deep vein thrombosis in the lower extremity. No cystic structure found in the  popliteal fossa.  *See table(s) above for measurements and observations.    Preliminary     EKG: Independently reviewed. Ventricular paced rhythm  Assessment/Plan Active Problems:   CAD (coronary artery disease) of artery bypass graft   HTN (hypertension)   Hyperlipidemia   MGUS (monoclonal gammopathy of unknown significance)   CHB (complete heart block) (HCC)   Neuropathy   Acute respiratory failure with hypoxia (HCC)   Acute respiratory failure with hypoxia Appears consistent with acute heart failure secondary to LR fluid bolus.  Patient without history of heart failure per chart review and personal recall.  Chest x-ray significant for cardiomegaly in addition to interstitial edema/vascular surgery. -Obtain stat BMP, CBC, BNP -Lasix 40 mg IV x1 tonight followed by Lasix 20 mg IV in AM; watch BMP -Echocardiogram -Strict in/out and daily weights -Telemetry -EKG  Cardiomegaly Seen on chest x-ray. Management above.  Venous stasis dermatitis No obvious evidence of infection. Patient could benefit from unna boots per primary care physician. In setting of chronic LE swelling  CAD Hyperlipidemia History of CABG. -Continue atorvastatin, metoprolol, Zetia  Frequent falls May be orthopedic in nature. Recommend outpatient orthopedic/sports medicine follow-up. Home health PT set up while in the ED.  Thrombocytopenia Chronic. -CBC pending  Hypothyroidism -Continue Synthroid  Neuropathy -Continue gabapentin  Essential hypertension -Continue metoprolol -Hold losartan secondary to AKI  AKI Unsure of etiology. Creatinine is from yesterday. Given IV fluids yesterday. Creatinine pending today. Overall, patient appears hypervolemic.  MGUS Diagnosed in 2016. Currently follows with medical oncology for active surveillance.  Complete heart block History of atrial fibrillation Patient is status post pacemaker. -Continue Eliquis and metoprolol   DVT prophylaxis: Eliquis Code  Status: DNR Family Communication: None at bedside Disposition Plan: Discharge home with home health once hypoxia improved Consults called: None Admission status: Observation   Cordelia Poche, MD [facility] 10/14/2019, 4:46 PM

## 2019-10-14 NOTE — Care Management (Signed)
ED CM gave home pulse oximeter to ED RN, patient also have 3n1 and w/c in room for home usage.

## 2019-10-14 NOTE — ED Provider Notes (Signed)
    Durable Medical Equipment  (From admission, onward)         Start     Ordered   10/14/19 1516  For home use only DME high strength lightweight manual wheelchair with seat cushion  Once    Comments: Patient suffers from chf which impairs their ability to perform daily activities like bathing, dressing, feeding, grooming and toileting in the home.  A crutch or walker will not resolve  issue with performing activities of daily living. A wheelchair will allow patient to safely perform daily activities.Length of need 12 months . (THEN ONE OF THESE TWO:) Patient self-propels the wheelchair while engaging in frequent activities such as laundry, meals and toileting which cannot be performed in a standard or lightweight wheelchair due to the weight of the chair. Accessories: elevating leg rests (ELRs), wheel locks, extensions and anti-tippers.   10/14/19 1516   10/14/19 1221  For home use only DME 3 n 1  Once    Comments: With elongated seat   10/14/19 [place], [place], [place] 10/14/19 1517

## 2019-10-14 NOTE — Discharge Planning (Signed)
[facility] met with pt at bedside regarding disposition needs.  Pt states he wants to return home with home health PT.  Pt visibly dyspneic and pausing between breaths. EDCM will provide portable pulse ox and dme wheelchair as recommended by PT evaluation.

## 2019-10-14 NOTE — ED Provider Notes (Addendum)
Neurology consult appreciated.  Patient is having pain related to hip replacement and has had gradually progressive difficulties.  He has had multiple falls.  There is no evidence of acute stroke.  However, with multiple falls, it is felt that he would benefit from referral to a skilled nursing facility for physical therapy and Occupational Therapy.  Because of falls and patient on anticoagulation, he was sent for CT of head which showed no evidence of bleeding or acute injury.  Also, his PCP was worried about possibility of DVT and he will be sent for venous Dopplers.   Delora Fuel, MD [id number]    Delora Fuel, MD [id number] [phone]

## 2019-10-14 NOTE — Progress Notes (Signed)
PT Note  Spoke with wife again by phone who reports patient with long entry walkway and feels he may need wheelchair to access home safely and that [place] who has been to the home recommended as well.   Patient suffers from dyspnea, L hip pain which impairs their ability to perform daily activities like accessing the entry in the home.  A walker alone will not resolve the issues with performing activities of daily living. A wheelchair will allow patient to safely perform daily activities.  The patient can self propel in the home or has a caregiver who can provide assistance.     Magda Kiel, [place] [phone] 10/14/2019

## 2019-10-14 NOTE — Discharge Planning (Signed)
Pt currently active with [facility] for PT services.  Resumption of care requested. Janae Sauce, RN of [facility] notified.  No DME needs identified at this time.

## 2019-10-14 NOTE — Discharge Planning (Signed)
Fuller Mandril, RN, BSN, [place] [phone] Pt qualifies for DME 3n1.  DME  ordered through [facility].  Zack Blank of [facility] notified to deliver 3n1 to pt room prior to D/C home.

## 2019-10-14 NOTE — Evaluation (Signed)
\Physical Therapy Evaluation Patient Details Name: Mike Price MRN: [id number] DOB: 16-Oct-1934 Today's Date: 10/14/2019   History of Present Illness  Mike Price is a 85 y.o. male with a history of hypertension, hyperlipidemia, A. fib on Eliquis who presents with falls.  He had 3 falls prompting him to call his PCP who referred him into the emergency department.  Clinical Impression  Patient presents with mobility limited due to L hip pain, peripheral neuropathy and LE edema with recent falls at home.  Feel he can discharge home with wife to assist and to resume HHPT services in addition to [facility] aide.  Discussed with pt and wife need for supervision during ambulation and mobility for safety, they also are planning to get a 3:1 and utilize their LTC policy to get extra aide help in the home.  Feel stable for d/c home with family support.     Follow Up Recommendations Home health PT;Supervision/Assistance - 24 hour(HH aide)    Equipment Recommendations  3in1 (PT)(elongated seat please)    Recommendations for Other Services       Precautions / Restrictions Precautions Precautions: Fall Precaution Comments: had 3 falls in one day      Mobility  Bed Mobility Overal bed mobility: Needs Assistance Bed Mobility: Supine to Sit;Sit to Supine     Supine to sit: HOB elevated;Min assist Sit to supine: Min assist   General bed mobility comments: assist for trunk from stretcher, assist for L LE into bed to supine  Transfers Overall transfer level: Needs assistance Equipment used: Rolling walker (2 wheeled) Transfers: Sit to/from Stand Sit to Stand: Min assist         General transfer comment: used walker to pull up so assist for stabilizing walker  Ambulation/Gait Ambulation/Gait assistance: Min guard Gait Distance (Feet): 50 Feet Assistive device: Rolling walker (2 wheeled) Gait Pattern/deviations: Step-to pattern;Shuffle;Step-through pattern;Trunk flexed;Wide base of  support     General Gait Details: increased time for all mobility, fatigued with SOB with ambulation so returned to room and obtained w/c as pt needing to have BM, up to toilet in bathroom with RW and increased time, cues for safety  Stairs            Wheelchair Mobility    Modified Rankin (Stroke Patients Only)       Balance Overall balance assessment: Needs assistance   Sitting balance-Leahy Scale: Good Sitting balance - Comments: seated edge of stretcher or on 3:1 over toilet feet on floor reaching for hygiene   Standing balance support: Bilateral upper extremity supported Standing balance-Leahy Scale: Poor Standing balance comment: UE support needed for balance                             Pertinent Vitals/Pain Pain Assessment: 0-10 Pain Score: 3  Pain Location: L leg Pain Descriptors / Indicators: Aching Pain Intervention(s): Monitored during session;Repositioned    Home Living Family/patient expects to be discharged to:: Private residence Living Arrangements: Spouse/significant other Available Help at Discharge: Available 24 hours/day Type of Home: Apartment Home Access: Stairs to enter Entrance Stairs-Rails: Can reach both Entrance Stairs-Number of Steps: 2-3 small steps Home Layout: One level Home Equipment: Walker - 4 wheels;Shower seat;Grab bars - tub/shower      Prior Function Level of Independence: Needs assistance   Gait / Transfers Assistance Needed: uses a rollator, had 3 falls in one day  ADL's / Homemaking Assistance Needed: wife assists with IADL's  including driving        Hand Dominance   Dominant Hand: Right    Extremity/Trunk Assessment   Upper Extremity Assessment Upper Extremity Assessment: RUE deficits/detail RUE Deficits / Details: AAROM limited to about 80 degrees, cannot lift shoulder unaided due to reported rotator cuff injury, strength elbow flexion/ext 4 to 4+/5, essential tremor in both hands RUE Sensation:  history of peripheral neuropathy    Lower Extremity Assessment Lower Extremity Assessment: LLE deficits/detail;RLE deficits/detail RLE Deficits / Details: AROM WFL, strength hip flexion 4/5, knee extension 4+/5, ankle DF 4+/5 RLE Sensation: history of peripheral neuropathy LLE Deficits / Details: AAROM WFL with pain in hip, strength hip flexion 2/5, knee extension 4/5, ankle DF 4+/5 LLE Sensation: history of peripheral neuropathy       Communication   Communication: No difficulties  Cognition Arousal/Alertness: Awake/alert Behavior During Therapy: WFL for tasks assessed/performed Overall Cognitive Status: Within Functional Limits for tasks assessed                                        General Comments General comments (skin integrity, edema, etc.): Educated in safety with pt and spoke with wife by phone regarding having assist for balance and ambulation in the home for safety    Exercises     Assessment/Plan    PT Assessment All further PT needs can be met in the next venue of care  PT Problem List         PT Treatment Interventions      PT Goals (Current goals can be found in the Care Plan section)  Acute Rehab PT Goals Patient Stated Goal: to go home PT Goal Formulation: All assessment and education complete, DC therapy    Frequency     Barriers to discharge        Co-evaluation               AM-PAC PT "6 Clicks" Mobility  Outcome Measure Help needed turning from your back to your side while in a flat bed without using bedrails?: A Little Help needed moving from lying on your back to sitting on the side of a flat bed without using bedrails?: A Little Help needed moving to and from a bed to a chair (including a wheelchair)?: A Little Help needed standing up from a chair using your arms (e.g., wheelchair or bedside chair)?: None Help needed to walk in hospital room?: A Little   6 Click Score: 16    End of Session Equipment Utilized  During Treatment: Oxygen   Patient left: in bed;with call bell/phone within reach   PT Visit Diagnosis: Other abnormalities of gait and mobility (R26.89);History of falling (Z91.81);Pain Pain - Right/Left: Left Pain - part of body: Hip    Time: 0950-1100 PT Time Calculation (min) (ACUTE ONLY): 70 min   Charges:   PT Evaluation $PT Eval Moderate Complexity: 1 Mod PT Treatments $Gait Training: 23-37 mins $Therapeutic Activity: 8-22 mins $Self Care/Home Management: [place] [phone] 10/14/2019   Reginia Naas 10/14/2019, 12:12 PM

## 2019-10-14 NOTE — ED Notes (Signed)
Tried to give report, RN not available at this time. 

## 2019-10-14 NOTE — Progress Notes (Signed)
Bilateral lower extremity venous duplex completed.  Preliminary results can be found under CV proc under chart review.  10/14/2019 9:58 AM  Daleen Squibb, RDMS, RVT

## 2019-10-14 NOTE — Progress Notes (Signed)
SATURATION QUALIFICATIONS: (This note is used to comply with regulatory documentation for home oxygen)  Patient Saturations on Room Air at Rest = 91%  Patient Saturations on Room Air while Ambulating = 81%  Patient Saturations on 2 Liters of oxygen while Ambulating = 93%  Please briefly explain why patient needs home oxygen: Patient hypoxic with mobility on room air.  Magda Kiel, [place] [phone] 10/14/2019

## 2019-10-15 ENCOUNTER — Observation Stay (HOSPITAL_COMMUNITY): Payer: Medicare Other

## 2019-10-15 ENCOUNTER — Observation Stay (HOSPITAL_BASED_OUTPATIENT_CLINIC_OR_DEPARTMENT_OTHER): Payer: Medicare Other

## 2019-10-15 DIAGNOSIS — I340 Nonrheumatic mitral (valve) insufficiency: Secondary | ICD-10-CM

## 2019-10-15 DIAGNOSIS — I2510 Atherosclerotic heart disease of native coronary artery without angina pectoris: Secondary | ICD-10-CM | POA: Diagnosis present

## 2019-10-15 DIAGNOSIS — K219 Gastro-esophageal reflux disease without esophagitis: Secondary | ICD-10-CM | POA: Diagnosis present

## 2019-10-15 DIAGNOSIS — F419 Anxiety disorder, unspecified: Secondary | ICD-10-CM | POA: Diagnosis present

## 2019-10-15 DIAGNOSIS — Z66 Do not resuscitate: Secondary | ICD-10-CM | POA: Diagnosis present

## 2019-10-15 DIAGNOSIS — D7589 Other specified diseases of blood and blood-forming organs: Secondary | ICD-10-CM | POA: Diagnosis present

## 2019-10-15 DIAGNOSIS — G629 Polyneuropathy, unspecified: Secondary | ICD-10-CM | POA: Diagnosis present

## 2019-10-15 DIAGNOSIS — I371 Nonrheumatic pulmonary valve insufficiency: Secondary | ICD-10-CM

## 2019-10-15 DIAGNOSIS — I509 Heart failure, unspecified: Secondary | ICD-10-CM | POA: Diagnosis present

## 2019-10-15 DIAGNOSIS — I361 Nonrheumatic tricuspid (valve) insufficiency: Secondary | ICD-10-CM

## 2019-10-15 DIAGNOSIS — I872 Venous insufficiency (chronic) (peripheral): Secondary | ICD-10-CM | POA: Diagnosis present

## 2019-10-15 DIAGNOSIS — Z20822 Contact with and (suspected) exposure to covid-19: Secondary | ICD-10-CM | POA: Diagnosis present

## 2019-10-15 DIAGNOSIS — J9601 Acute respiratory failure with hypoxia: Secondary | ICD-10-CM | POA: Diagnosis present

## 2019-10-15 DIAGNOSIS — N289 Disorder of kidney and ureter, unspecified: Secondary | ICD-10-CM | POA: Diagnosis present

## 2019-10-15 DIAGNOSIS — I130 Hypertensive heart and chronic kidney disease with heart failure and stage 1 through stage 4 chronic kidney disease, or unspecified chronic kidney disease: Secondary | ICD-10-CM | POA: Diagnosis present

## 2019-10-15 DIAGNOSIS — N1831 Chronic kidney disease, stage 3a: Secondary | ICD-10-CM | POA: Diagnosis present

## 2019-10-15 DIAGNOSIS — D696 Thrombocytopenia, unspecified: Secondary | ICD-10-CM | POA: Diagnosis present

## 2019-10-15 DIAGNOSIS — I10 Essential (primary) hypertension: Secondary | ICD-10-CM | POA: Diagnosis not present

## 2019-10-15 DIAGNOSIS — I442 Atrioventricular block, complete: Secondary | ICD-10-CM | POA: Diagnosis present

## 2019-10-15 DIAGNOSIS — I25810 Atherosclerosis of coronary artery bypass graft(s) without angina pectoris: Secondary | ICD-10-CM | POA: Diagnosis present

## 2019-10-15 DIAGNOSIS — R0989 Other specified symptoms and signs involving the circulatory and respiratory systems: Secondary | ICD-10-CM | POA: Diagnosis not present

## 2019-10-15 DIAGNOSIS — N400 Enlarged prostate without lower urinary tract symptoms: Secondary | ICD-10-CM | POA: Diagnosis present

## 2019-10-15 DIAGNOSIS — I480 Paroxysmal atrial fibrillation: Secondary | ICD-10-CM | POA: Diagnosis present

## 2019-10-15 DIAGNOSIS — E785 Hyperlipidemia, unspecified: Secondary | ICD-10-CM | POA: Diagnosis present

## 2019-10-15 DIAGNOSIS — E039 Hypothyroidism, unspecified: Secondary | ICD-10-CM | POA: Diagnosis present

## 2019-10-15 DIAGNOSIS — I5033 Acute on chronic diastolic (congestive) heart failure: Secondary | ICD-10-CM | POA: Diagnosis not present

## 2019-10-15 DIAGNOSIS — D472 Monoclonal gammopathy: Secondary | ICD-10-CM | POA: Diagnosis present

## 2019-10-15 DIAGNOSIS — G4733 Obstructive sleep apnea (adult) (pediatric): Secondary | ICD-10-CM | POA: Diagnosis present

## 2019-10-15 DIAGNOSIS — M170 Bilateral primary osteoarthritis of knee: Secondary | ICD-10-CM | POA: Diagnosis present

## 2019-10-15 DIAGNOSIS — N179 Acute kidney failure, unspecified: Secondary | ICD-10-CM | POA: Diagnosis present

## 2019-10-15 LAB — CBC
HCT: 38.2 % — ABNORMAL LOW (ref 39.0–52.0)
Hemoglobin: 12.6 g/dL — ABNORMAL LOW (ref 13.0–17.0)
MCH: 36.6 pg — ABNORMAL HIGH (ref 26.0–34.0)
MCHC: 33 g/dL (ref 30.0–36.0)
MCV: 111 fL — ABNORMAL HIGH (ref 80.0–100.0)
Platelets: 100 10*3/uL — ABNORMAL LOW (ref 150–400)
RBC: 3.44 MIL/uL — ABNORMAL LOW (ref 4.22–5.81)
RDW: 15 % (ref 11.5–15.5)
WBC: 6.2 10*3/uL (ref 4.0–10.5)
nRBC: 0 % (ref 0.0–0.2)

## 2019-10-15 LAB — BASIC METABOLIC PANEL
Anion gap: 8 (ref 5–15)
BUN: 22 mg/dL (ref 8–23)
CO2: 29 mmol/L (ref 22–32)
Calcium: 8.6 mg/dL — ABNORMAL LOW (ref 8.9–10.3)
Chloride: 102 mmol/L (ref 98–111)
Creatinine, Ser: 1.46 mg/dL — ABNORMAL HIGH (ref 0.61–1.24)
GFR calc Af Amer: 51 mL/min — ABNORMAL LOW (ref >60)
GFR calc non Af Amer: 44 mL/min — ABNORMAL LOW (ref >60)
Glucose, Bld: 115 mg/dL — ABNORMAL HIGH (ref 70–99)
Potassium: 3.8 mmol/L (ref 3.5–5.1)
Sodium: 139 mmol/L (ref 135–145)

## 2019-10-15 LAB — ECHOCARDIOGRAM COMPLETE
Height: 71 in
Weight: 3883.62 oz

## 2019-10-15 MED ORDER — FUROSEMIDE 10 MG/ML IJ SOLN
60.0000 mg | Freq: Two times a day (BID) | INTRAMUSCULAR | Status: DC
  Administered 2019-10-15 – 2019-10-17 (×6): 60 mg via INTRAVENOUS
  Filled 2019-10-15 (×7): qty 6

## 2019-10-15 NOTE — Progress Notes (Addendum)
[facility]  PROGRESS NOTE  Mike Price [id number] DOB: [birth year] DOA: 10/13/2019 PCP: Sueanne Margarita, DO Admit date - 10/13/2019   Admitting Physician Mariel Aloe, MD  Outpatient Primary MD for the patient is Sueanne Margarita, DO  LOS - 0 Brief Narrative   Mike Price is a 85 y.o. year old male with medical history significant for CAD status post CABG, CHB status post pacemaker, AF, HTN who presented on 10/13/2019 with progressive dyspnea, generalized weakness, frequent falls presumed related to leg "giving out" who was admitted initially to the ED for work-up of possible stroke. Patient admits to not taking his home lasix very often despite his PCP advising increase in dose ( PCP plan of care note in chart)  ED Course:   Neurology evaluated and based off negative CT scan and nonfocal neurologic exam ruled out stroke.  Patient was initially a planned discharge in the ED but while working with physical therapy had dyspnea with hypoxia requiring supplemental oxygen 2 L via nasal cannula.  Chest x-ray obtained consistent with vascular congestion and patient was brought in under observation for acute hypoxic respiratory failure secondary to CHF exacerbation.  Hospital Course: Patient was given IV Lasix overnight and admitted observation with hope for improvement in respiratory status in 24 hours.  However, patient has continued to have persistent conversational dyspnea, O2 requirements, 2+ pitting edema bilateral lower extremities up to sacrum warranting status being changed from observation to inpatient for continued medical treatment with IV Lasix, close monitoring of kidney function and symptomatic improvement.     Subjective  Still feels short of breath with minimal exertion No cough No fevers or chills No chest pain Abdominal pain   A & P  Acute Hypoxic respiratory failure secondary to CHF exacerbation Progressive dyspnea, peripheral edema, BNP of 435, chest x-ray with  cardiomegaly and pulmonary edema consistent with CHF activation.  Currently on 2 L maintaining normal oxygen saturation. TTE shows preserved EF but unable to determine diastolic function.  Responded well to IV Lasix with a net negative of 2 L however patient is easily dyspneic with conversation, continues to require 2 L, significant pitting 2+ edema of bilateral lower extremities up to sacrum -Patient warrants changing status from observation to inpatient to continue medical treatment with IV Lasix 60 mg twice daily for today -Reassess volume status in a.m. to determine IV Lasix dosage -Daily weights, fluid restrict, strict I's and O's -Low-sodium diet   Recurrent falls Patient has chronic weakness in left leg.  Is very likely recurrent falls related to increased edema from above Once more euvolemic would advise checking orthostatics -Otherwise treat above -PT already evaluated patient, recommends home health PT   AKI on CKD Stage 3 Baseline creatinine around 1-1 0.08.  Creatinine remained stable during admission at 1.4.  Likely related to CHF exacerbation, creatinine has not increased with IV Lasix -We will closely monitor kidney function, monitor output while on diuresis -Holding home losartan -Otherwise avoid nephrotoxins  Hypertension Blood pressure range 115-131/50 3-70 -Holding home losartan -Continue home Lopressor, closely monitor  Chronic thrombocytopenia Stable at baseline.  No signs or symptoms of bleeding -Daily CBC  Chronic medical problems, stable Hyperlipidemia, Lipitor, Zetia BPH, doxazosin Paroxysmal atrial fibrillation with history of complete heart block status post pacemaker, rate controlled, Eliquis Hypothyroidism, continue Synthroid GERD, PPI Neuropathy, home gabapentin MGUS (2016), follow with medical oncology for active surveillance     Family Communication  : Wife updated at bedside on 1/9  Code Status :  DNR confirmed on day of  admission  Disposition Plan  : Status change from observation to inpatient given patient needs medical treatment with scheduled IV Lasix given symptomatic seizure exacerbation, close monitoring kidney function  Consults  : Neurology Procedures  : TTE, 1/9  DVT Prophylaxis  : Eliquis  Lab Results  Component Value Date   PLT 100 (L) 10/15/2019    Diet :  Diet Order            Diet Heart Room service appropriate? Yes; Fluid consistency: Thin  Diet effective now               Inpatient Medications Scheduled Meds: . apixaban  5 mg Oral BID  . atorvastatin  40 mg Oral QPM  . doxazosin  1 mg Oral QHS  . ezetimibe  10 mg Oral QPM  . furosemide  40 mg Intravenous Once   Followed by  . furosemide  20 mg Intravenous Daily  . gabapentin  100 mg Oral TID  . levothyroxine  100 mcg Oral Q0600  . metoprolol tartrate  100 mg Oral BID  . pantoprazole  40 mg Oral Daily  . sodium chloride flush  3 mL Intravenous Once   Continuous Infusions: PRN Meds:.acetaminophen **OR** acetaminophen, clonazepam, LORazepam, ondansetron **OR** ondansetron (ZOFRAN) IV, traMADol  Antibiotics  :   Anti-infectives (From admission, onward)   None       Objective   Vitals:   10/15/19 0028 10/15/19 0115 10/15/19 0520 10/15/19 0850  BP:  118/64 (!) 115/59 (!) 131/53  Pulse: 73 70 70 71  Resp: 20 20 20 20   Temp: 97.6 F (36.4 C) 99.5 F (37.5 C) 98.3 F (36.8 C) 98.8 F (37.1 C)  TempSrc: Oral Oral Oral Oral  SpO2: 96% 95% 94% 97%  Weight:   110.1 kg   Height:   5\' 11"  (1.803 m)     SpO2: 97 % O2 Flow Rate (L/min): 2 L/min  Wt Readings from Last 3 Encounters:  10/15/19 110.1 kg  09/15/19 115 kg  03/16/19 111.6 kg     Intake/Output Summary (Last 24 hours) at 10/15/2019 0855 Last data filed at 10/15/2019 0535 Gross per 24 hour  Intake 240 ml  Output 300 ml  Net -60 ml    Physical Exam:  Awake Alert, Oriented X 3, Normal affect No new F.N deficits,  [place].AT, No JVD  appreciated Conversational dyspnea, crackles at bases, no respiratory distress on 2L RRR,No Gallops,Rubs or new Murmurs,  2+ pitting edema from bilateral ankle up to sacrum Chronic skin changes of bilateral lower legs   I have personally reviewed the following:   Data Reviewed:  CBC Recent Labs  Lab 10/13/19 1343 10/14/19 1735 10/15/19 0252  WBC 5.9 6.1 6.2  HGB 13.8 13.3 12.6*  HCT 42.9 40.0 38.2*  PLT 99* 105* 100*  MCV 112.9* 111.1* 111.0*  MCH 36.3* 36.9* 36.6*  MCHC 32.2 33.3 33.0  RDW 14.9 14.8 15.0  LYMPHSABS  --  1.1  --   MONOABS  --  0.8  --   EOSABS  --  0.2  --   BASOSABS  --  0.0  --     Chemistries  Recent Labs  Lab 10/13/19 1343 10/14/19 1735 10/15/19 0252  NA 141 137 139  K 4.5 4.6 3.8  CL 103 99 102  CO2 31 29 29   GLUCOSE 119* 113* 115*  BUN 28* 22 22  CREATININE 1.42* 1.37* 1.46*  CALCIUM 9.1 8.5* 8.6*  AST  --  41  --   ALT  --  17  --   ALKPHOS  --  164*  --   BILITOT  --  3.4*  --    ------------------------------------------------------------------------------------------------------------------ No results for input(s): CHOL, HDL, LDLCALC, TRIG, CHOLHDL, LDLDIRECT in the last 72 hours.  Lab Results  Component Value Date   HGBA1C 5.9 (H) 06/19/2015   ------------------------------------------------------------------------------------------------------------------ No results for input(s): TSH, T4TOTAL, T3FREE, THYROIDAB in the last 72 hours.  Invalid input(s): FREET3 ------------------------------------------------------------------------------------------------------------------ No results for input(s): VITAMINB12, FOLATE, FERRITIN, TIBC, IRON, RETICCTPCT in the last 72 hours.  Coagulation profile No results for input(s): INR, PROTIME in the last 168 hours.  No results for input(s): DDIMER in the last 72 hours.  Cardiac Enzymes No results for input(s): CKMB, TROPONINI, MYOGLOBIN in the last 168 hours.  Invalid input(s):  CK ------------------------------------------------------------------------------------------------------------------    Component Value Date/Time   BNP 431.5 (H) 10/14/2019 1730    Micro Results Recent Results (from the past 240 hour(s))  SARS CORONAVIRUS 2 (TAT 6-24 HRS) Nasopharyngeal Nasopharyngeal Swab     Status: None   Collection Time: 10/13/19  6:34 PM   Specimen: Nasopharyngeal Swab  Result Value Ref Range Status   SARS Coronavirus 2 NEGATIVE NEGATIVE Final    Comment: (NOTE) SARS-CoV-2 target nucleic acids are NOT DETECTED. The SARS-CoV-2 RNA is generally detectable in upper and lower respiratory specimens during the acute phase of infection. Negative results do not preclude SARS-CoV-2 infection, do not rule out co-infections with other pathogens, and should not be used as the sole basis for treatment or other patient management decisions. Negative results must be combined with clinical observations, patient history, and epidemiological information. The expected result is Negative. Fact Sheet for Patients: SugarRoll.be Fact Sheet for Healthcare Providers: https://www.woods-mathews.com/ This test is not yet approved or cleared by the [place] FDA and  has been authorized for detection and/or diagnosis of SARS-CoV-2 by FDA under an Emergency Use Authorization (EUA). This EUA will remain  in effect (meaning this test can be used) for the duration of the COVID-19 declaration under Section 56 4(b)(1) of the Act, 21 U.S.C. section 360bbb-3(b)(1), unless the authorization is terminated or revoked sooner. Performed at [facility], [place] [address], [place], [place] [address]     Radiology Reports DG Chest 2 View  Result Date: 10/14/2019 CLINICAL DATA:  Shortness of breath, difficulty breathing EXAM: CHEST - 2 VIEW COMPARISON:  08/04/2017, CT abdomen and pelvis from 08/16/2019 FINDINGS: Cardiomediastinal contours remain  enlarged. Changes of median sternotomy and CABG are redemonstrated. Left-sided dual lead pacer device in place, power pack over left chest. Signs of vascular congestion increased interstitial markings bilaterally. No dense consolidation. No signs of pleural effusion on AP or on lateral view. Visualized skeletal structures are unremarkable. IMPRESSION: 1. Signs of vascular congestion and interstitial edema. 2. Cardiomegaly. Electronically Signed   By: Zetta Bills M.D.   On: 10/14/2019 16:09   DG Tibia/Fibula Left  Result Date: 10/13/2019 CLINICAL DATA:  Leg weakness EXAM: LEFT TIBIA AND FIBULA - 2 VIEW COMPARISON:  None. FINDINGS: No fracture or malalignment. Vascular calcifications. Diffuse edema within the lower leg. Ossicle or old fracture at the medial malleolus. Advanced arthritis medial joint space of the knee. IMPRESSION: No acute osseous abnormality. Electronically Signed   By: Donavan Foil M.D.   On: 10/13/2019 19:20   CT HEAD WO CONTRAST  Result Date: 10/14/2019 CLINICAL DATA:  Frequent falls, multiple in MS assessment yesterday EXAM: CT HEAD WITHOUT CONTRAST TECHNIQUE: Contiguous  axial images were obtained from the base of the skull through the vertex without intravenous contrast. COMPARISON:  CT 12/17/2012 FINDINGS: Brain: No evidence of acute infarction, hemorrhage, hydrocephalus, extra-axial collection or mass lesion/mass effect. Symmetric prominence of the ventricles, cisterns and sulci compatible with parenchymal volume loss. Confluent areas of white matter hypoattenuation are most compatible with chronic microvascular angiopathy. Senescent mineralization of the basal ganglia predominantly within the globus pallidi, a benign incidental finding. Vascular: Atherosclerotic calcification of the carotid siphons and intradural vertebral arteries. No hyperdense vessel. Skull: No significant scalp swelling or calvarial fracture. No suspicious osseous lesions. Sinuses/Orbits: Minimal mucosal  thickening in the maxillary sinuses. Remainder of the paranasal sinuses and mastoid air cells are predominantly clear. Orbital structures are unremarkable aside from prior lens extractions. Other: Debris present within the external auditory canals. Mild bilateral TMJ arthrosis, left slightly greater than right. IMPRESSION: 1. No acute intracranial abnormality. 2. Advanced parenchymal volume loss and chronic microvascular angiopathy, similar to prior. 3. Debris in the external auditory canals, consider assessment for cerumen impaction. Electronically Signed   By: Lovena Le M.D.   On: 10/14/2019 06:27   CUP PACEART REMOTE DEVICE CHECK  Result Date: 10/12/2019 Scheduled remote reviewed.  Normal device function.  Next remote 91 days.  VAS [place] LOWER EXTREMITY VENOUS (DVT) (ONLY [facility] & [facility])  Result Date: 10/14/2019  Lower Venous Study Indications: Swelling.  Limitations: Body habitus and poor ultrasound/tissue interface. Comparison Study: Lower extremity venous duplex 05-20-16 negative. Performing Technologist: Baldwin Crown ARDMS, RVT  Examination Guidelines: A complete evaluation includes B-mode imaging, spectral Doppler, color Doppler, and power Doppler as needed of all accessible portions of each vessel. Bilateral testing is considered an integral part of a complete examination. Limited examinations for reoccurring indications may be performed as noted.  +---------+---------------+---------+-----------+----------+--------------+ RIGHT    CompressibilityPhasicitySpontaneityPropertiesThrombus Aging +---------+---------------+---------+-----------+----------+--------------+ CFV      Full           Yes      Yes                                 +---------+---------------+---------+-----------+----------+--------------+ SFJ      Full                                                        +---------+---------------+---------+-----------+----------+--------------+ FV Prox  Full                                                         +---------+---------------+---------+-----------+----------+--------------+ FV Mid   Full                                                        +---------+---------------+---------+-----------+----------+--------------+ FV DistalFull                                                        +---------+---------------+---------+-----------+----------+--------------+  PFV      Full                                                        +---------+---------------+---------+-----------+----------+--------------+ POP      Full           Yes      Yes                                 +---------+---------------+---------+-----------+----------+--------------+ PTV      Full                                                        +---------+---------------+---------+-----------+----------+--------------+ PERO     Full                                                        +---------+---------------+---------+-----------+----------+--------------+   +---------+---------------+---------+-----------+----------+--------------+ LEFT     CompressibilityPhasicitySpontaneityPropertiesThrombus Aging +---------+---------------+---------+-----------+----------+--------------+ CFV      Full           Yes      Yes                                 +---------+---------------+---------+-----------+----------+--------------+ SFJ      Full                                                        +---------+---------------+---------+-----------+----------+--------------+ FV Prox  Full                                                        +---------+---------------+---------+-----------+----------+--------------+ FV Mid   Full                                                        +---------+---------------+---------+-----------+----------+--------------+ FV DistalFull                                                         +---------+---------------+---------+-----------+----------+--------------+ PFV      Full                                                        +---------+---------------+---------+-----------+----------+--------------+   POP      Full           Yes      Yes                                 +---------+---------------+---------+-----------+----------+--------------+ PTV      Full                                                        +---------+---------------+---------+-----------+----------+--------------+ PERO     Full                                                        +---------+---------------+---------+-----------+----------+--------------+ Limited visualization of bilateral lower extremity veins due to patient body habitus and swelling.    Summary: Right: There is no evidence of deep vein thrombosis in the lower extremity. No cystic structure found in the popliteal fossa. Left: There is no evidence of deep vein thrombosis in the lower extremity. No cystic structure found in the popliteal fossa.  *See table(s) above for measurements and observations.    Preliminary      Time Spent in minutes  30     Desiree Hane M.D on 10/15/2019 at 8:55 AM  To page go to [email or web address] - password [email or web address]

## 2019-10-15 NOTE — Progress Notes (Signed)
  Echocardiogram 2D Echocardiogram has been performed.  Mike Price 10/15/2019, 10:41 AM

## 2019-10-15 NOTE — Plan of Care (Signed)
  Problem: Education: Goal: Knowledge of disease or condition will improve Outcome: Progressing   Problem: Respiratory: Goal: Ability to maintain a clear airway will improve Outcome: Progressing Goal: Levels of oxygenation will improve Outcome: Progressing Note: Patient has improved breathing and appears more comfortable. Continue to monitor

## 2019-10-16 ENCOUNTER — Encounter (HOSPITAL_COMMUNITY): Payer: Self-pay | Admitting: Family Medicine

## 2019-10-16 DIAGNOSIS — R0989 Other specified symptoms and signs involving the circulatory and respiratory systems: Secondary | ICD-10-CM

## 2019-10-16 DIAGNOSIS — I509 Heart failure, unspecified: Secondary | ICD-10-CM

## 2019-10-16 LAB — BASIC METABOLIC PANEL
Anion gap: 10 (ref 5–15)
BUN: 21 mg/dL (ref 8–23)
CO2: 31 mmol/L (ref 22–32)
Calcium: 8.4 mg/dL — ABNORMAL LOW (ref 8.9–10.3)
Chloride: 95 mmol/L — ABNORMAL LOW (ref 98–111)
Creatinine, Ser: 1.24 mg/dL (ref 0.61–1.24)
GFR calc Af Amer: >60 mL/min (ref >60)
GFR calc non Af Amer: 54 mL/min — ABNORMAL LOW (ref >60)
Glucose, Bld: 90 mg/dL (ref 70–99)
Potassium: 3.6 mmol/L (ref 3.5–5.1)
Sodium: 136 mmol/L (ref 135–145)

## 2019-10-16 LAB — MAGNESIUM: Magnesium: 1.6 mg/dL — ABNORMAL LOW (ref 1.7–2.4)

## 2019-10-16 MED ORDER — POTASSIUM CHLORIDE CRYS ER 20 MEQ PO TBCR
40.0000 meq | EXTENDED_RELEASE_TABLET | Freq: Once | ORAL | Status: AC
  Administered 2019-10-16: 12:00:00 40 meq via ORAL
  Filled 2019-10-16: qty 2

## 2019-10-16 MED ORDER — MAGNESIUM SULFATE 2 GM/50ML IV SOLN
2.0000 g | Freq: Once | INTRAVENOUS | Status: AC
  Administered 2019-10-16: 16:00:00 2 g via INTRAVENOUS
  Filled 2019-10-16: qty 50

## 2019-10-16 MED ORDER — SENNOSIDES-DOCUSATE SODIUM 8.6-50 MG PO TABS
2.0000 | ORAL_TABLET | Freq: Two times a day (BID) | ORAL | Status: DC
  Administered 2019-10-16 – 2019-10-17 (×4): 2 via ORAL
  Filled 2019-10-16 (×5): qty 2

## 2019-10-16 MED ORDER — POLYETHYLENE GLYCOL 3350 17 G PO PACK
17.0000 g | PACK | Freq: Two times a day (BID) | ORAL | Status: DC
  Administered 2019-10-16 – 2019-10-17 (×3): 17 g via ORAL
  Filled 2019-10-16 (×4): qty 1

## 2019-10-16 NOTE — Progress Notes (Signed)
RT placed pt on CPAP dream station for the night on auto titrate 12 max 6 min w/4Lpm bled into the system. Pt respiratory status stable at this time. RT will continue to monitor.

## 2019-10-16 NOTE — Progress Notes (Signed)
[facility]  PROGRESS NOTE  Mike Price [id number] DOB: 09-26-1935 DOA: 10/13/2019 PCP: Sueanne Margarita, DO Admit date - 10/13/2019   Admitting Physician Mariel Aloe, MD  Outpatient Primary MD for the patient is Sueanne Margarita, DO  LOS - 1 Brief Narrative   Mike Price is a 85 y.o. year old male with medical history significant for CAD status post CABG, CHB status post pacemaker, AF, HTN who presented on 10/13/2019 with progressive dyspnea, generalized weakness, frequent falls presumed related to leg "giving out" who was admitted initially to the ED for work-up of possible stroke. Patient admits to not taking his home lasix very often despite his PCP advising increase in dose ( PCP plan of care note in chart)  ED Course:   Neurology evaluated and based off negative CT scan and nonfocal neurologic exam ruled out stroke.  Patient was initially a planned discharge in the ED but while working with physical therapy had dyspnea with hypoxia requiring supplemental oxygen 2 L via nasal cannula.  Chest x-ray obtained consistent with vascular congestion and patient was brought in under observation for acute hypoxic respiratory failure secondary to CHF exacerbation.  Hospital Course: Patient was given IV Lasix overnight and admitted observation with hope for improvement in respiratory status in 24 hours.  However, patient has continued to have persistent conversational dyspnea, O2 requirements, 2+ pitting edema bilateral lower extremities up to sacrum warranting status being changed from observation to inpatient for continued medical treatment with IV Lasix, close monitoring of kidney function and symptomatic improvement.     Subjective  Feels breathing is improving Has no acute complaints   A & P  Acute Hypoxic respiratory failure secondary to CHF exacerbation Progressive dyspnea, peripheral edema, BNP of 435, chest x-ray with cardiomegaly and pulmonary edema consistent with CHF  activation.  Currently on 2 L maintaining normal oxygen saturation. TTE shows preserved EF but unable to determine diastolic function.  Responded well to IV Lasix with a net negative of 4 L during hospital stay.  Patient still has quite a bit of volume with pitting edema up to sacrum the less than 24 hours ago -Continue IV Lasix 60 mg twice daily, closely monitor output, continue to monitor electrolytes (K greater than 4, mag greater than 2), kidney function -Daily weights, fluid restrict, strict I's and O's -Low-sodium diet  Recurrent falls Patient has chronic weakness in left leg.  Is very likely recurrent falls related to increased edema from above Once more euvolemic would advise checking orthostatics -Otherwise treat above -PT already evaluated patient, recommends home health PT   AKI on CKD Stage 3, in setting of CHF exacerbation Baseline creatinine around 1-1.08.  Peak creatinine of 1.46 on admission, currently downtrending 1.24 in setting of diuresis -We will closely monitor kidney function, monitor output while on diuresis -Holding home losartan and other nephrotoxins  Hypertension, at goal -Holding home losartan (given AKI) -Continue home Lopressor, closely monitor  Chronic thrombocytopenia Stable at baseline.  No signs or symptoms of bleeding -Daily CBC  Chronic medical problems, stable Hyperlipidemia,- Lipitor, Zetia BPH,- doxazosin Paroxysmal atrial fibrillation with history of complete heart block status post pacemaker, rate controlled,- Eliquis Hypothyroidism,- continue Synthroid GERD,- PPI Neuropathy,- home gabapentin MGUS (2016), follow with medical oncology for active surveillance     Family Communication  : Wife updated at bedside on 1/9, will call update on 1/10  Code Status : DNR confirmed on day of admission  Disposition Plan  : Needs continued inpatient stay to  address significant peripheral edema with scheduled IV Lasix given symptomatic CHF  exacerbation, close monitoring kidney function and electrolytes  Consults  : Neurology Procedures  : TTE, 1/9  DVT Prophylaxis  : Eliquis  Lab Results  Component Value Date   PLT 100 (L) 10/15/2019    Diet :  Diet Order            Diet Heart Room service appropriate? Yes; Fluid consistency: Thin  Diet effective now               Inpatient Medications Scheduled Meds: . apixaban  5 mg Oral BID  . atorvastatin  40 mg Oral QPM  . doxazosin  1 mg Oral QHS  . ezetimibe  10 mg Oral QPM  . furosemide  60 mg Intravenous BID  . gabapentin  100 mg Oral TID  . levothyroxine  100 mcg Oral Q0600  . metoprolol tartrate  100 mg Oral BID  . pantoprazole  40 mg Oral Daily  . polyethylene glycol  17 g Oral BID  . senna-docusate  2 tablet Oral BID  . sodium chloride flush  3 mL Intravenous Once   Continuous Infusions: . magnesium sulfate bolus IVPB     PRN Meds:.acetaminophen **OR** acetaminophen, clonazepam, LORazepam, ondansetron **OR** ondansetron (ZOFRAN) IV, traMADol  Antibiotics  :   Anti-infectives (From admission, onward)   None       Objective   Vitals:   10/16/19 0051 10/16/19 0619 10/16/19 0733 10/16/19 1259  BP:  124/63 134/66 (!) 143/121  Pulse:  69 70 71  Resp:  18 20 20   Temp:  98.7 F (37.1 C) 98.7 F (37.1 C) 98 F (36.7 C)  TempSrc:  Oral Oral Oral  SpO2:  94% 97% 94%  Weight: 107.6 kg     Height:        SpO2: 94 % O2 Flow Rate (L/min): 2 L/min  Wt Readings from Last 3 Encounters:  10/16/19 107.6 kg  09/15/19 115 kg  03/16/19 111.6 kg     Intake/Output Summary (Last 24 hours) at 10/16/2019 1338 Last data filed at 10/16/2019 0920 Gross per 24 hour  Intake 500 ml  Output 4825 ml  Net -4325 ml    Physical Exam:  Awake Alert, Oriented X 3, Normal affect No new F.N deficits,  Mike Price, No JVD appreciated crackles at bases, no respiratory distress on 2L RRR,No Gallops,Rubs or new Murmurs,  2+ pitting edema from bilateral ankle up to  sacrum Chronic skin changes of bilateral lower legs   I have personally reviewed the following:   Data Reviewed:  CBC Recent Labs  Lab 10/13/19 1343 10/14/19 1735 10/15/19 0252  WBC 5.9 6.1 6.2  HGB 13.8 13.3 12.6*  HCT 42.9 40.0 38.2*  PLT 99* 105* 100*  MCV 112.9* 111.1* 111.0*  MCH 36.3* 36.9* 36.6*  MCHC 32.2 33.3 33.0  RDW 14.9 14.8 15.0  LYMPHSABS  --  1.1  --   MONOABS  --  0.8  --   EOSABS  --  0.2  --   BASOSABS  --  0.0  --     Chemistries  Recent Labs  Lab 10/13/19 1343 10/14/19 1735 10/15/19 0252 10/16/19 0320 10/16/19 1028  NA 141 137 139 136  --   K 4.5 4.6 3.8 3.6  --   CL 103 99 102 95*  --   CO2 31 29 29 31   --   GLUCOSE 119* 113* 115* 90  --   BUN 28* 22  22 21  --   CREATININE 1.42* 1.37* 1.46* 1.24  --   CALCIUM 9.1 8.5* 8.6* 8.4*  --   MG  --   --   --   --  1.6*  AST  --  41  --   --   --   ALT  --  17  --   --   --   ALKPHOS  --  164*  --   --   --   BILITOT  --  3.4*  --   --   --    ------------------------------------------------------------------------------------------------------------------ No results for input(s): CHOL, HDL, LDLCALC, TRIG, CHOLHDL, LDLDIRECT in the last 72 hours.  Lab Results  Component Value Date   HGBA1C 5.9 (H) 06/19/2015   ------------------------------------------------------------------------------------------------------------------ No results for input(s): TSH, T4TOTAL, T3FREE, THYROIDAB in the last 72 hours.  Invalid input(s): FREET3 ------------------------------------------------------------------------------------------------------------------ No results for input(s): VITAMINB12, FOLATE, FERRITIN, TIBC, IRON, RETICCTPCT in the last 72 hours.  Coagulation profile No results for input(s): INR, PROTIME in the last 168 hours.  No results for input(s): DDIMER in the last 72 hours.  Cardiac Enzymes No results for input(s): CKMB, TROPONINI, MYOGLOBIN in the last 168 hours.  Invalid input(s):  CK ------------------------------------------------------------------------------------------------------------------    Component Value Date/Time   BNP 431.5 (H) 10/14/2019 1730    Micro Results Recent Results (from the past 240 hour(s))  SARS CORONAVIRUS 2 (TAT 6-24 HRS) Nasopharyngeal Nasopharyngeal Swab     Status: None   Collection Time: 10/13/19  6:34 PM   Specimen: Nasopharyngeal Swab  Result Value Ref Range Status   SARS Coronavirus 2 NEGATIVE NEGATIVE Final    Comment: (NOTE) SARS-CoV-2 target nucleic acids are NOT DETECTED. The SARS-CoV-2 RNA is generally detectable in upper and lower respiratory specimens during the acute phase of infection. Negative results do not preclude SARS-CoV-2 infection, do not rule out co-infections with other pathogens, and should not be used as the sole basis for treatment or other patient management decisions. Negative results must be combined with clinical observations, patient history, and epidemiological information. The expected result is Negative. Fact Sheet for Patients: SugarRoll.be Fact Sheet for Healthcare Providers: https://www.woods-mathews.com/ This test is not yet approved or cleared by the [place] FDA and  has been authorized for detection and/or diagnosis of SARS-CoV-2 by FDA under an Emergency Use Authorization (EUA). This EUA will remain  in effect (meaning this test can be used) for the duration of the COVID-19 declaration under Section 56 4(b)(1) of the Act, 21 U.S.C. section 360bbb-3(b)(1), unless the authorization is terminated or revoked sooner. Performed at [facility], [place] [address], [place], [place] [address]     Radiology Reports DG Chest 2 View  Result Date: 10/14/2019 CLINICAL DATA:  Shortness of breath, difficulty breathing EXAM: CHEST - 2 VIEW COMPARISON:  08/04/2017, CT abdomen and pelvis from 08/16/2019 FINDINGS: Cardiomediastinal contours remain  enlarged. Changes of median sternotomy and CABG are redemonstrated. Left-sided dual lead pacer device in place, power pack over left chest. Signs of vascular congestion increased interstitial markings bilaterally. No dense consolidation. No signs of pleural effusion on AP or on lateral view. Visualized skeletal structures are unremarkable. IMPRESSION: 1. Signs of vascular congestion and interstitial edema. 2. Cardiomegaly. Electronically Signed   By: Zetta Bills M.D.   On: 10/14/2019 16:09   DG Tibia/Fibula Left  Result Date: 10/13/2019 CLINICAL DATA:  Leg weakness EXAM: LEFT TIBIA AND FIBULA - 2 VIEW COMPARISON:  None. FINDINGS: No fracture or malalignment. Vascular calcifications. Diffuse  edema within the lower leg. Ossicle or old fracture at the medial malleolus. Advanced arthritis medial joint space of the knee. IMPRESSION: No acute osseous abnormality. Electronically Signed   By: Donavan Foil M.D.   On: 10/13/2019 19:20   CT HEAD WO CONTRAST  Result Date: 10/14/2019 CLINICAL DATA:  Frequent falls, multiple in MS assessment yesterday EXAM: CT HEAD WITHOUT CONTRAST TECHNIQUE: Contiguous axial images were obtained from the base of the skull through the vertex without intravenous contrast. COMPARISON:  CT 12/17/2012 FINDINGS: Brain: No evidence of acute infarction, hemorrhage, hydrocephalus, extra-axial collection or mass lesion/mass effect. Symmetric prominence of the ventricles, cisterns and sulci compatible with parenchymal volume loss. Confluent areas of white matter hypoattenuation are most compatible with chronic microvascular angiopathy. Senescent mineralization of the basal ganglia predominantly within the globus pallidi, a benign incidental finding. Vascular: Atherosclerotic calcification of the carotid siphons and intradural vertebral arteries. No hyperdense vessel. Skull: No significant scalp swelling or calvarial fracture. No suspicious osseous lesions. Sinuses/Orbits: Minimal mucosal  thickening in the maxillary sinuses. Remainder of the paranasal sinuses and mastoid air cells are predominantly clear. Orbital structures are unremarkable aside from prior lens extractions. Other: Debris present within the external auditory canals. Mild bilateral TMJ arthrosis, left slightly greater than right. IMPRESSION: 1. No acute intracranial abnormality. 2. Advanced parenchymal volume loss and chronic microvascular angiopathy, similar to prior. 3. Debris in the external auditory canals, consider assessment for cerumen impaction. Electronically Signed   By: Lovena Le M.D.   On: 10/14/2019 06:27   DG CHEST PORT 1 VIEW  Result Date: 10/15/2019 CLINICAL DATA:  Pulmonary vascular congestion. Negative COVID-19 study. EXAM: PORTABLE CHEST 1 VIEW COMPARISON:  October 14, 2019 FINDINGS: Stable cardiomegaly. Stable pacemaker. The hila and mediastinum are normal. Pulmonary edema is identified, similar in the interval. No other acute abnormalities are noted. IMPRESSION: Cardiomegaly and pulmonary edema are stable in the interval. Electronically Signed   By: Dorise Bullion III M.D   On: 10/15/2019 11:57   ECHOCARDIOGRAM COMPLETE  Result Date: 10/15/2019   ECHOCARDIOGRAM REPORT   Patient Name:   DONTAE DIANTONIO Date of Exam: 10/15/2019 Medical Rec #:  [id number]       Height:       71.0 in Accession #:    [id number]      Weight:       242.7 lb Date of Birth:  Feb 21, 1935       BSA:          2.29 m Patient Age:    89 years        BP:           115/59 mmHg Patient Gender: M               HR:           70 bpm. Exam Location:  Inpatient Procedure: 2D Echo, Cardiac Doppler and Color Doppler Indications:    I51.7 Cardiomegaly  History:        Patient has prior history of Echocardiogram examinations, most                 recent 05/25/2014. CAD, Pacemaker; Risk Factors:Sleep Apnea,                 Dyslipidemia and Hypertension. Dysrhythmia. GERD.                 Hypothyroidism.  Sonographer:    Jonelle Sidle Dance Referring Phys:  [place]  1. Left ventricular  ejection fraction, by visual estimation, is 60 to 65%. The left ventricle has normal function. There is no left ventricular hypertrophy.  2. Left ventricular diastolic parameters are indeterminate.  3. The left ventricle has no regional wall motion abnormalities.  4. Global right ventricle has normal systolic function.The right ventricular size is normal.  5. Left atrial size was mildly dilated.  6. Right atrial size was mildly dilated.  7. The mitral valve is normal in structure. Moderate to severe mitral valve regurgitation. No evidence of mitral stenosis.  8. The tricuspid valve is normal in structure.  9. The aortic valve is tricuspid. Aortic valve regurgitation is trivial. Mild aortic valve sclerosis without stenosis. 10. The pulmonic valve was normal in structure. Pulmonic valve regurgitation is mild. 11. Moderately elevated pulmonary artery systolic pressure. 12. A pacer wire is visualized. 13. The inferior vena cava is dilated in size with <50% respiratory variability, suggesting right atrial pressure of 15 mmHg. 14. Normal LV systolic function; mild biatrial enlargement; moderate to severe MR; moderate TR; moderate pulmonary hypertension. FINDINGS  Left Ventricle: Left ventricular ejection fraction, by visual estimation, is 60 to 65%. The left ventricle has normal function. The left ventricle has no regional wall motion abnormalities. There is no left ventricular hypertrophy. Left ventricular diastolic parameters are indeterminate. Normal left atrial pressure. Right Ventricle: The right ventricular size is normal.Global RV systolic function is has normal systolic function. The tricuspid regurgitant velocity is 3.04 m/s, and with an assumed right atrial pressure of 15 mmHg, the estimated right ventricular systolic pressure is moderately elevated at 51.8 mmHg. Left Atrium: Left atrial size was mildly dilated. Right Atrium: Right atrial size was mildly  dilated Pericardium: There is no evidence of pericardial effusion. Mitral Valve: The mitral valve is normal in structure. Moderate to severe mitral valve regurgitation. No evidence of mitral valve stenosis by observation. Tricuspid Valve: The tricuspid valve is normal in structure. Tricuspid valve regurgitation moderate. Aortic Valve: The aortic valve is tricuspid. Aortic valve regurgitation is trivial. Mild aortic valve sclerosis is present, with no evidence of aortic valve stenosis. Pulmonic Valve: The pulmonic valve was normal in structure. Pulmonic valve regurgitation is mild. Pulmonic regurgitation is mild. Aorta: The aortic root is normal in size and structure. Venous: The inferior vena cava is dilated in size with less than 50% respiratory variability, suggesting right atrial pressure of 15 mmHg. IAS/Shunts: No atrial level shunt detected by color flow Doppler. Additional Comments: Normal LV systolic function; mild biatrial enlargement; moderate to severe MR; moderate TR; moderate pulmonary hypertension. A pacer wire is visualized.  LEFT VENTRICLE PLAX 2D LVIDd:         4.92 cm LVIDs:         3.45 cm LV PW:         1.34 cm LV IVS:        0.90 cm LVOT diam:     2.00 cm LV SV:         65 ml LV SV Index:   27.16 LVOT Area:     3.14 cm  RIGHT VENTRICLE            IVC RV Basal diam:  3.51 cm    IVC diam: 2.92 cm RV Mid diam:    2.65 cm RV S prime:     6.86 cm/s TAPSE (M-mode): 1.3 cm LEFT ATRIUM             Index       RIGHT ATRIUM  Index LA diam:        5.80 cm 2.53 cm/m  RA Area:     26.20 cm LA Vol (A2C):   98.0 ml 42.81 ml/m RA Volume:   85.10 ml  37.17 ml/m LA Vol (A4C):   58.3 ml 25.47 ml/m LA Biplane Vol: 75.7 ml 33.07 ml/m  AORTIC VALVE LVOT Vmax:   108.00 cm/s LVOT Vmean:  68.100 cm/s LVOT VTI:    0.212 m  AORTA Ao Root diam: 3.20 cm Ao Asc diam:  3.10 cm MITRAL VALVE                         TRICUSPID VALVE MV Area (PHT): 3.42 cm              TR Peak grad:   36.8 mmHg MV PHT:        64.38  msec            TR Vmax:        312.00 cm/s MV Decel Time: 222 msec MR Peak grad:    63.8 mmHg           SHUNTS MR Mean grad:    39.0 mmHg           Systemic VTI:  0.21 m MR Vmax:         399.50 cm/s         Systemic Diam: 2.00 cm MR Vmean:        289.5 cm/s MR PISA:         1.57 cm MR PISA Eff ROA: 15 mm MR PISA Radius:  0.50 cm MV E velocity: 127.00 cm/s 103 cm/s MV A velocity: 47.50 cm/s  70.3 cm/s MV E/A ratio:  2.67        1.5  Kirk Ruths MD Electronically signed by Kirk Ruths MD Signature Date/Time: 10/15/2019/12:58:40 PM    Final    CUP PACEART REMOTE DEVICE CHECK  Result Date: 10/12/2019 Scheduled remote reviewed.  Normal device function.  Next remote 91 days.  VAS [place] LOWER EXTREMITY VENOUS (DVT) (ONLY [facility] & [facility])  Result Date: 10/16/2019  Lower Venous Study Indications: Swelling.  Limitations: Body habitus and poor ultrasound/tissue interface. Comparison Study: Lower extremity venous duplex 05-20-16 negative. Performing Technologist: Baldwin Crown ARDMS, RVT  Examination Guidelines: A complete evaluation includes B-mode imaging, spectral Doppler, color Doppler, and power Doppler as needed of all accessible portions of each vessel. Bilateral testing is considered an integral part of a complete examination. Limited examinations for reoccurring indications may be performed as noted.  +---------+---------------+---------+-----------+----------+--------------+ RIGHT    CompressibilityPhasicitySpontaneityPropertiesThrombus Aging +---------+---------------+---------+-----------+----------+--------------+ CFV      Full           Yes      Yes                                 +---------+---------------+---------+-----------+----------+--------------+ SFJ      Full                                                        +---------+---------------+---------+-----------+----------+--------------+ FV Prox  Full                                                         +---------+---------------+---------+-----------+----------+--------------+  FV Mid   Full                                                        +---------+---------------+---------+-----------+----------+--------------+ FV DistalFull                                                        +---------+---------------+---------+-----------+----------+--------------+ PFV      Full                                                        +---------+---------------+---------+-----------+----------+--------------+ POP      Full           Yes      Yes                                 +---------+---------------+---------+-----------+----------+--------------+ PTV      Full                                                        +---------+---------------+---------+-----------+----------+--------------+ PERO     Full                                                        +---------+---------------+---------+-----------+----------+--------------+   +---------+---------------+---------+-----------+----------+--------------+ LEFT     CompressibilityPhasicitySpontaneityPropertiesThrombus Aging +---------+---------------+---------+-----------+----------+--------------+ CFV      Full           Yes      Yes                                 +---------+---------------+---------+-----------+----------+--------------+ SFJ      Full                                                        +---------+---------------+---------+-----------+----------+--------------+ FV Prox  Full                                                        +---------+---------------+---------+-----------+----------+--------------+ FV Mid   Full                                                        +---------+---------------+---------+-----------+----------+--------------+  FV DistalFull                                                         +---------+---------------+---------+-----------+----------+--------------+ PFV      Full                                                        +---------+---------------+---------+-----------+----------+--------------+ POP      Full           Yes      Yes                                 +---------+---------------+---------+-----------+----------+--------------+ PTV      Full                                                        +---------+---------------+---------+-----------+----------+--------------+ PERO     Full                                                        +---------+---------------+---------+-----------+----------+--------------+ Limited visualization of bilateral lower extremity veins due to patient body habitus and swelling.    Summary: Right: There is no evidence of deep vein thrombosis in the lower extremity. No cystic structure found in the popliteal fossa. Left: There is no evidence of deep vein thrombosis in the lower extremity. No cystic structure found in the popliteal fossa.  *See table(s) above for measurements and observations. Electronically signed by Harold Barban MD on 10/16/2019 at 10:49:11 AM.    Final      Time Spent in minutes  30     Desiree Hane M.D on 10/16/2019 at 1:38 PM  To page go to [email or web address] - password [facility]

## 2019-10-16 NOTE — Progress Notes (Signed)
[name] about pt BP 108/49 automatic with cardura and lopressor scheduled.  Per provider recheck BP manually (110/50) and hold lopressor if DBP is less than 60.  Paged again to notify.

## 2019-10-17 DIAGNOSIS — N289 Disorder of kidney and ureter, unspecified: Secondary | ICD-10-CM

## 2019-10-17 DIAGNOSIS — N179 Acute kidney failure, unspecified: Secondary | ICD-10-CM

## 2019-10-17 LAB — CBC
HCT: 40.3 % (ref 39.0–52.0)
Hemoglobin: 13.5 g/dL (ref 13.0–17.0)
MCH: 36.2 pg — ABNORMAL HIGH (ref 26.0–34.0)
MCHC: 33.5 g/dL (ref 30.0–36.0)
MCV: 108 fL — ABNORMAL HIGH (ref 80.0–100.0)
Platelets: 120 10*3/uL — ABNORMAL LOW (ref 150–400)
RBC: 3.73 MIL/uL — ABNORMAL LOW (ref 4.22–5.81)
RDW: 14.6 % (ref 11.5–15.5)
WBC: 6.4 10*3/uL (ref 4.0–10.5)
nRBC: 0 % (ref 0.0–0.2)

## 2019-10-17 LAB — BASIC METABOLIC PANEL
Anion gap: 10 (ref 5–15)
BUN: 26 mg/dL — ABNORMAL HIGH (ref 8–23)
CO2: 31 mmol/L (ref 22–32)
Calcium: 8.5 mg/dL — ABNORMAL LOW (ref 8.9–10.3)
Chloride: 93 mmol/L — ABNORMAL LOW (ref 98–111)
Creatinine, Ser: 1.41 mg/dL — ABNORMAL HIGH (ref 0.61–1.24)
GFR calc Af Amer: 53 mL/min — ABNORMAL LOW (ref >60)
GFR calc non Af Amer: 46 mL/min — ABNORMAL LOW (ref >60)
Glucose, Bld: 197 mg/dL — ABNORMAL HIGH (ref 70–99)
Potassium: 4 mmol/L (ref 3.5–5.1)
Sodium: 134 mmol/L — ABNORMAL LOW (ref 135–145)

## 2019-10-17 MED ORDER — SALINE SPRAY 0.65 % NA SOLN
1.0000 | NASAL | Status: DC | PRN
  Filled 2019-10-17: qty 44

## 2019-10-17 NOTE — Progress Notes (Addendum)
Patient has refused CPAP for tonight.  No distress noted at this time, will continue to monitor.

## 2019-10-17 NOTE — Plan of Care (Signed)
°  Problem: Education: °Goal: Knowledge of disease or condition will improve °Outcome: Progressing °Goal: Knowledge of the prescribed therapeutic regimen will improve °Outcome: Progressing °Goal: Individualized Educational Video(s) °Outcome: Progressing °  °

## 2019-10-17 NOTE — Progress Notes (Signed)
[facility]  PROGRESS NOTE  Mike Price [id number] DOB: 1935-09-05 DOA: 10/13/2019 PCP: Sueanne Margarita, DO Admit date - 10/13/2019   Admitting Physician Mariel Aloe, MD  Outpatient Primary MD for the patient is Sueanne Margarita, DO  LOS - 2 Brief Narrative   Mike Price is a 85 y.o. year old male with medical history significant for CAD status post CABG, CHB status post pacemaker, AF, HTN who presented on 10/13/2019 with progressive dyspnea, generalized weakness, frequent falls presumed related to leg "giving out" who was admitted initially to the ED for work-up of possible stroke. Patient admits to not taking his home lasix very often despite his PCP advising increase in dose ( PCP plan of care note in chart)  ED Course:   Neurology evaluated and based off negative CT scan and nonfocal neurologic exam ruled out stroke.  Patient was initially a planned discharge in the ED but while working with physical therapy had dyspnea with hypoxia requiring supplemental oxygen 2 L via nasal cannula.  Chest x-ray obtained consistent with vascular congestion and patient was brought in under observation for acute hypoxic respiratory failure secondary to CHF exacerbation.  Hospital Course: Patient was given IV Lasix overnight and admitted observation with hope for improvement in respiratory status in 24 hours.  However, patient has continued to have persistent conversational dyspnea, O2 requirements, 2+ pitting edema bilateral lower extremities up to sacrum warranting status being changed from observation to inpatient for continued medical treatment with IV Lasix, close monitoring of kidney function and symptomatic improvement.     Subjective  Blood pressure running a little low overnight with SBP's in the low 100s    A & P  Acute Hypoxic respiratory failure secondary to CHF exacerbation, improving Net -5 L hospital stay Still has dyspnea with minimal exertion Leg edema to bilateral upper  thighs is improved significantly Stool to -Wean O2 to maintain SPO2 greater than 92%, likely need ambulatory O2 test prior to discharge -Continue IV Lasix 60 mg twice daily for 1 more day likely, depending on output may be able to transition to p.o. on 1/12, -Closely monitor output, continue to monitor electrolytes (K greater than 4, mag greater than 2), kidney function -Daily weights, fluid restrict, strict I's and O's -Low-sodium diet  Recurrent falls Patient has chronic weakness in left leg.  Is very likely recurrent falls related to increased edema from above Once more euvolemic would advise rechecking orthostatics -Otherwise treat above -PT already evaluated patient, recommends home health PT/24 H assist   AKI on CKD Stage 3, in setting of CHF exacerbation Baseline creatinine around 1-1.08.  Cr currently 1.4, slightly up after mild decrease to 1.2 yesterday -We will closely monitor kidney function, monitor output while on diuresis -Holding home losartan and other nephrotoxins  Hypertension, at goal Low SBP in the low 100s overnight, currently much improved with blood pressure range 112-126/49-53 -Holding home losartan (given AKI) -Continue home Lopressor, closely monitor  Chronic thrombocytopenia Stable at baseline.  No signs or symptoms of bleeding -Daily CBC  Chronic medical problems, stable Hyperlipidemia,- Lipitor, Zetia BPH,- doxazosin Paroxysmal atrial fibrillation with history of complete heart block status post pacemaker, rate controlled,- Eliquis, Lopressor Hypothyroidism,- continue Synthroid GERD,- PPI Neuropathy,- home gabapentin MGUS (2016), follow with medical oncology for active surveillance     Family Communication  : Wife updated at bedside on 1/9, will call update on 1/11  Code Status : DNR confirmed on day of admission  Disposition Plan  : Needs  continued inpatient stay to address significant peripheral edema with scheduled IV Lasix given  symptomatic CHF exacerbation, close monitoring kidney function and electrolytes  Consults  : Neurology Procedures  : TTE, 1/9  DVT Prophylaxis  : Eliquis  Lab Results  Component Value Date   PLT 120 (L) 10/17/2019    Diet :  Diet Order            Diet Heart Room service appropriate? Yes; Fluid consistency: Thin  Diet effective now               Inpatient Medications Scheduled Meds: . apixaban  5 mg Oral BID  . atorvastatin  40 mg Oral QPM  . doxazosin  1 mg Oral QHS  . ezetimibe  10 mg Oral QPM  . furosemide  60 mg Intravenous BID  . gabapentin  100 mg Oral TID  . levothyroxine  100 mcg Oral Q0600  . metoprolol tartrate  100 mg Oral BID  . pantoprazole  40 mg Oral Daily  . polyethylene glycol  17 g Oral BID  . senna-docusate  2 tablet Oral BID  . sodium chloride flush  3 mL Intravenous Once   Continuous Infusions:  PRN Meds:.acetaminophen **OR** acetaminophen, clonazepam, LORazepam, ondansetron **OR** ondansetron (ZOFRAN) IV, traMADol  Antibiotics  :   Anti-infectives (From admission, onward)   None       Objective   Vitals:   10/16/19 2126 10/17/19 0427 10/17/19 0430 10/17/19 0828  BP: (!) 110/50 (!) 112/53  (!) 126/49  Pulse:  70  70  Resp:  18    Temp:  98.6 F (37 C)    TempSrc:  Oral    SpO2:  96%    Weight:   105.2 kg   Height:        SpO2: 96 % O2 Flow Rate (L/min): 2 L/min  Wt Readings from Last 3 Encounters:  10/17/19 105.2 kg  09/15/19 115 kg  03/16/19 111.6 kg     Intake/Output Summary (Last 24 hours) at 10/17/2019 1314 Last data filed at 10/17/2019 0930 Gross per 24 hour  Intake 1010 ml  Output 1400 ml  Net -390 ml    Physical Exam:  Awake Alert, Oriented X 3, Normal affect No new F.N deficits,  [place].AT, No JVD appreciated No crackles at bases, no respiratory distress on 2L RRR,No Gallops,Rubs or new Murmurs,  2+ pitting edema from bilateral ankle up to thighs Chronic skin changes of bilateral lower legs   I have  personally reviewed the following:   Data Reviewed:  CBC Recent Labs  Lab 10/13/19 1343 10/14/19 1735 10/15/19 0252 10/17/19 1006  WBC 5.9 6.1 6.2 6.4  HGB 13.8 13.3 12.6* 13.5  HCT 42.9 40.0 38.2* 40.3  PLT 99* 105* 100* 120*  MCV 112.9* 111.1* 111.0* 108.0*  MCH 36.3* 36.9* 36.6* 36.2*  MCHC 32.2 33.3 33.0 33.5  RDW 14.9 14.8 15.0 14.6  LYMPHSABS  --  1.1  --   --   MONOABS  --  0.8  --   --   EOSABS  --  0.2  --   --   BASOSABS  --  0.0  --   --     Chemistries  Recent Labs  Lab 10/13/19 1343 10/14/19 1735 10/15/19 0252 10/16/19 0320 10/16/19 1028 10/17/19 1006  NA 141 137 139 136  --  134*  K 4.5 4.6 3.8 3.6  --  4.0  CL 103 99 102 95*  --  93*  CO2  31 29 29 31   --  31  GLUCOSE 119* 113* 115* 90  --  197*  BUN 28* 22 22 21   --  26*  CREATININE 1.42* 1.37* 1.46* 1.24  --  1.41*  CALCIUM 9.1 8.5* 8.6* 8.4*  --  8.5*  MG  --   --   --   --  1.6*  --   AST  --  41  --   --   --   --   ALT  --  17  --   --   --   --   ALKPHOS  --  164*  --   --   --   --   BILITOT  --  3.4*  --   --   --   --    ------------------------------------------------------------------------------------------------------------------ No results for input(s): CHOL, HDL, LDLCALC, TRIG, CHOLHDL, LDLDIRECT in the last 72 hours.  Lab Results  Component Value Date   HGBA1C 5.9 (H) 06/19/2015   ------------------------------------------------------------------------------------------------------------------ No results for input(s): TSH, T4TOTAL, T3FREE, THYROIDAB in the last 72 hours.  Invalid input(s): FREET3 ------------------------------------------------------------------------------------------------------------------ No results for input(s): VITAMINB12, FOLATE, FERRITIN, TIBC, IRON, RETICCTPCT in the last 72 hours.  Coagulation profile No results for input(s): INR, PROTIME in the last 168 hours.  No results for input(s): DDIMER in the last 72 hours.  Cardiac Enzymes No results  for input(s): CKMB, TROPONINI, MYOGLOBIN in the last 168 hours.  Invalid input(s): CK ------------------------------------------------------------------------------------------------------------------    Component Value Date/Time   BNP 431.5 (H) 10/14/2019 1730    Micro Results Recent Results (from the past 240 hour(s))  SARS CORONAVIRUS 2 (TAT 6-24 HRS) Nasopharyngeal Nasopharyngeal Swab     Status: None   Collection Time: 10/13/19  6:34 PM   Specimen: Nasopharyngeal Swab  Result Value Ref Range Status   SARS Coronavirus 2 NEGATIVE NEGATIVE Final    Comment: (NOTE) SARS-CoV-2 target nucleic acids are NOT DETECTED. The SARS-CoV-2 RNA is generally detectable in upper and lower respiratory specimens during the acute phase of infection. Negative results do not preclude SARS-CoV-2 infection, do not rule out co-infections with other pathogens, and should not be used as the sole basis for treatment or other patient management decisions. Negative results must be combined with clinical observations, patient history, and epidemiological information. The expected result is Negative. Fact Sheet for Patients: SugarRoll.be Fact Sheet for Healthcare Providers: https://www.woods-mathews.com/ This test is not yet approved or cleared by the [place] FDA and  has been authorized for detection and/or diagnosis of SARS-CoV-2 by FDA under an Emergency Use Authorization (EUA). This EUA will remain  in effect (meaning this test can be used) for the duration of the COVID-19 declaration under Section 56 4(b)(1) of the Act, 21 U.S.C. section 360bbb-3(b)(1), unless the authorization is terminated or revoked sooner. Performed at [facility], [place] [address] [place], [place] [address]     Radiology Reports DG Chest 2 View  Result Date: 10/14/2019 CLINICAL DATA:  Shortness of breath, difficulty breathing EXAM: CHEST - 2 VIEW COMPARISON:  08/04/2017,  CT abdomen and pelvis from 08/16/2019 FINDINGS: Cardiomediastinal contours remain enlarged. Changes of median sternotomy and CABG are redemonstrated. Left-sided dual lead pacer device in place, power pack over left chest. Signs of vascular congestion increased interstitial markings bilaterally. No dense consolidation. No signs of pleural effusion on AP or on lateral view. Visualized skeletal structures are unremarkable. IMPRESSION: 1. Signs of vascular congestion and interstitial edema. 2. Cardiomegaly. Electronically Signed   By: Cay Schillings  Wile M.D.   On: 10/14/2019 16:09   DG Tibia/Fibula Left  Result Date: 10/13/2019 CLINICAL DATA:  Leg weakness EXAM: LEFT TIBIA AND FIBULA - 2 VIEW COMPARISON:  None. FINDINGS: No fracture or malalignment. Vascular calcifications. Diffuse edema within the lower leg. [place]icle or old fracture at the medial malleolus. Advanced arthritis medial joint space of the knee. IMPRESSION: No acute osseous abnormality. Electronically Signed   By: Donavan Foil M.D.   On: 10/13/2019 19:20   CT HEAD WO CONTRAST  Result Date: 10/14/2019 CLINICAL DATA:  Frequent falls, multiple in MS assessment yesterday EXAM: CT HEAD WITHOUT CONTRAST TECHNIQUE: Contiguous axial images were obtained from the base of the skull through the vertex without intravenous contrast. COMPARISON:  CT 12/17/2012 FINDINGS: Brain: No evidence of acute infarction, hemorrhage, hydrocephalus, extra-axial collection or mass lesion/mass effect. Symmetric prominence of the ventricles, cisterns and sulci compatible with parenchymal volume loss. Confluent areas of white matter hypoattenuation are most compatible with chronic microvascular angiopathy. Senescent mineralization of the basal ganglia predominantly within the globus pallidi, a benign incidental finding. Vascular: Atherosclerotic calcification of the carotid siphons and intradural vertebral arteries. No hyperdense vessel. Skull: No significant scalp swelling or calvarial  fracture. No suspicious osseous lesions. Sinuses/Orbits: Minimal mucosal thickening in the maxillary sinuses. Remainder of the paranasal sinuses and mastoid air cells are predominantly clear. Orbital structures are unremarkable aside from prior lens extractions. Other: Debris present within the external auditory canals. Mild bilateral TMJ arthrosis, left slightly greater than right. IMPRESSION: 1. No acute intracranial abnormality. 2. Advanced parenchymal volume loss and chronic microvascular angiopathy, similar to prior. 3. Debris in the external auditory canals, consider assessment for cerumen impaction. Electronically Signed   By: Lovena Le M.D.   On: 10/14/2019 06:27   DG CHEST PORT 1 VIEW  Result Date: 10/15/2019 CLINICAL DATA:  Pulmonary vascular congestion. Negative COVID-19 study. EXAM: PORTABLE CHEST 1 VIEW COMPARISON:  October 14, 2019 FINDINGS: Stable cardiomegaly. Stable pacemaker. The hila and mediastinum are normal. Pulmonary edema is identified, similar in the interval. No other acute abnormalities are noted. IMPRESSION: Cardiomegaly and pulmonary edema are stable in the interval. Electronically Signed   By: Dorise Bullion III M.D   On: 10/15/2019 11:57   ECHOCARDIOGRAM COMPLETE  Result Date: 10/15/2019   ECHOCARDIOGRAM REPORT   Patient Name:   EDDER THOMLINSON Date of Exam: 10/15/2019 Medical Rec #:  [id number]       Height:       71.0 in Accession #:    [id number]      Weight:       242.7 lb Date of Birth:  01-10-1935       BSA:          2.29 m Patient Age:    54 years        BP:           115/59 mmHg Patient Gender: M               HR:           70 bpm. Exam Location:  Inpatient Procedure: 2D Echo, Cardiac Doppler and Color Doppler Indications:    I51.7 Cardiomegaly  History:        Patient has prior history of Echocardiogram examinations, most                 recent 05/25/2014. CAD, Pacemaker; Risk Factors:Sleep Apnea,                 Dyslipidemia  and Hypertension. Dysrhythmia. GERD.                  Hypothyroidism.  Sonographer:    Jonelle Sidle Dance Referring Phys: [place]  1. Left ventricular ejection fraction, by visual estimation, is 60 to 65%. The left ventricle has normal function. There is no left ventricular hypertrophy.  2. Left ventricular diastolic parameters are indeterminate.  3. The left ventricle has no regional wall motion abnormalities.  4. Global right ventricle has normal systolic function.The right ventricular size is normal.  5. Left atrial size was mildly dilated.  6. Right atrial size was mildly dilated.  7. The mitral valve is normal in structure. Moderate to severe mitral valve regurgitation. No evidence of mitral stenosis.  8. The tricuspid valve is normal in structure.  9. The aortic valve is tricuspid. Aortic valve regurgitation is trivial. Mild aortic valve sclerosis without stenosis. 10. The pulmonic valve was normal in structure. Pulmonic valve regurgitation is mild. 11. Moderately elevated pulmonary artery systolic pressure. 12. A pacer wire is visualized. 13. The inferior vena cava is dilated in size with <50% respiratory variability, suggesting right atrial pressure of 15 mmHg. 14. Normal LV systolic function; mild biatrial enlargement; moderate to severe MR; moderate TR; moderate pulmonary hypertension. FINDINGS  Left Ventricle: Left ventricular ejection fraction, by visual estimation, is 60 to 65%. The left ventricle has normal function. The left ventricle has no regional wall motion abnormalities. There is no left ventricular hypertrophy. Left ventricular diastolic parameters are indeterminate. Normal left atrial pressure. Right Ventricle: The right ventricular size is normal.Global RV systolic function is has normal systolic function. The tricuspid regurgitant velocity is 3.04 m/s, and with an assumed right atrial pressure of 15 mmHg, the estimated right ventricular systolic pressure is moderately elevated at 51.8 mmHg. Left Atrium: Left atrial size  was mildly dilated. Right Atrium: Right atrial size was mildly dilated Pericardium: There is no evidence of pericardial effusion. Mitral Valve: The mitral valve is normal in structure. Moderate to severe mitral valve regurgitation. No evidence of mitral valve stenosis by observation. Tricuspid Valve: The tricuspid valve is normal in structure. Tricuspid valve regurgitation moderate. Aortic Valve: The aortic valve is tricuspid. Aortic valve regurgitation is trivial. Mild aortic valve sclerosis is present, with no evidence of aortic valve stenosis. Pulmonic Valve: The pulmonic valve was normal in structure. Pulmonic valve regurgitation is mild. Pulmonic regurgitation is mild. Aorta: The aortic root is normal in size and structure. Venous: The inferior vena cava is dilated in size with less than 50% respiratory variability, suggesting right atrial pressure of 15 mmHg. IAS/Shunts: No atrial level shunt detected by color flow Doppler. Additional Comments: Normal LV systolic function; mild biatrial enlargement; moderate to severe MR; moderate TR; moderate pulmonary hypertension. A pacer wire is visualized.  LEFT VENTRICLE PLAX 2D LVIDd:         4.92 cm LVIDs:         3.45 cm LV PW:         1.34 cm LV IVS:        0.90 cm LVOT diam:     2.00 cm LV SV:         65 ml LV SV Index:   27.16 LVOT Area:     3.14 cm  RIGHT VENTRICLE            IVC RV Basal diam:  3.51 cm    IVC diam: 2.92 cm RV Mid diam:    2.65 cm  RV S prime:     6.86 cm/s TAPSE (M-mode): 1.3 cm LEFT ATRIUM             Index       RIGHT ATRIUM           Index LA diam:        5.80 cm 2.53 cm/m  RA Area:     26.20 cm LA Vol (A2C):   98.0 ml 42.81 ml/m RA Volume:   85.10 ml  37.17 ml/m LA Vol (A4C):   58.3 ml 25.47 ml/m LA Biplane Vol: 75.7 ml 33.07 ml/m  AORTIC VALVE LVOT Vmax:   108.00 cm/s LVOT Vmean:  68.100 cm/s LVOT VTI:    0.212 m  AORTA Ao Root diam: 3.20 cm Ao Asc diam:  3.10 cm MITRAL VALVE                         TRICUSPID VALVE MV Area (PHT): 3.42  cm              TR Peak grad:   36.8 mmHg MV PHT:        64.38 msec            TR Vmax:        312.00 cm/s MV Decel Time: 222 msec MR Peak grad:    63.8 mmHg           SHUNTS MR Mean grad:    39.0 mmHg           Systemic VTI:  0.21 m MR Vmax:         399.50 cm/s         Systemic Diam: 2.00 cm MR Vmean:        289.5 cm/s MR PISA:         1.57 cm MR PISA Eff ROA: 15 mm MR PISA Radius:  0.50 cm MV E velocity: 127.00 cm/s 103 cm/s MV A velocity: 47.50 cm/s  70.3 cm/s MV E/A ratio:  2.67        1.5  Kirk Ruths MD Electronically signed by Kirk Ruths MD Signature Date/Time: 10/15/2019/12:58:40 PM    Final    CUP PACEART REMOTE DEVICE CHECK  Result Date: 10/12/2019 Scheduled remote reviewed.  Normal device function.  Next remote 91 days.  VAS [place] LOWER EXTREMITY VENOUS (DVT) (ONLY [facility] & [facility])  Result Date: 10/16/2019  Lower Venous Study Indications: Swelling.  Limitations: Body habitus and poor ultrasound/tissue interface. Comparison Study: Lower extremity venous duplex 05-20-16 negative. Performing Technologist: Baldwin Crown ARDMS, RVT  Examination Guidelines: A complete evaluation includes B-mode imaging, spectral Doppler, color Doppler, and power Doppler as needed of all accessible portions of each vessel. Bilateral testing is considered an integral part of a complete examination. Limited examinations for reoccurring indications may be performed as noted.  +---------+---------------+---------+-----------+----------+--------------+ RIGHT    CompressibilityPhasicitySpontaneityPropertiesThrombus Aging +---------+---------------+---------+-----------+----------+--------------+ CFV      Full           Yes      Yes                                 +---------+---------------+---------+-----------+----------+--------------+ SFJ      Full                                                        +---------+---------------+---------+-----------+----------+--------------+  FV Prox  Full                                                         +---------+---------------+---------+-----------+----------+--------------+ FV Mid   Full                                                        +---------+---------------+---------+-----------+----------+--------------+ FV DistalFull                                                        +---------+---------------+---------+-----------+----------+--------------+ PFV      Full                                                        +---------+---------------+---------+-----------+----------+--------------+ POP      Full           Yes      Yes                                 +---------+---------------+---------+-----------+----------+--------------+ PTV      Full                                                        +---------+---------------+---------+-----------+----------+--------------+ PERO     Full                                                        +---------+---------------+---------+-----------+----------+--------------+   +---------+---------------+---------+-----------+----------+--------------+ LEFT     CompressibilityPhasicitySpontaneityPropertiesThrombus Aging +---------+---------------+---------+-----------+----------+--------------+ CFV      Full           Yes      Yes                                 +---------+---------------+---------+-----------+----------+--------------+ SFJ      Full                                                        +---------+---------------+---------+-----------+----------+--------------+ FV Prox  Full                                                        +---------+---------------+---------+-----------+----------+--------------+  FV Mid   Full                                                        +---------+---------------+---------+-----------+----------+--------------+ FV DistalFull                                                         +---------+---------------+---------+-----------+----------+--------------+ PFV      Full                                                        +---------+---------------+---------+-----------+----------+--------------+ POP      Full           Yes      Yes                                 +---------+---------------+---------+-----------+----------+--------------+ PTV      Full                                                        +---------+---------------+---------+-----------+----------+--------------+ PERO     Full                                                        +---------+---------------+---------+-----------+----------+--------------+ Limited visualization of bilateral lower extremity veins due to patient body habitus and swelling.    Summary: Right: There is no evidence of deep vein thrombosis in the lower extremity. No cystic structure found in the popliteal fossa. Left: There is no evidence of deep vein thrombosis in the lower extremity. No cystic structure found in the popliteal fossa.  *See table(s) above for measurements and observations. Electronically signed by Harold Barban MD on 10/16/2019 at 10:49:11 AM.    Final      Time Spent in minutes  30     Desiree Hane M.D on 10/17/2019 at 1:14 PM  To page go to [email or web address] - password [facility]

## 2019-10-18 LAB — BASIC METABOLIC PANEL
Anion gap: 11 (ref 5–15)
BUN: 28 mg/dL — ABNORMAL HIGH (ref 8–23)
CO2: 31 mmol/L (ref 22–32)
Calcium: 8.3 mg/dL — ABNORMAL LOW (ref 8.9–10.3)
Chloride: 94 mmol/L — ABNORMAL LOW (ref 98–111)
Creatinine, Ser: 1.3 mg/dL — ABNORMAL HIGH (ref 0.61–1.24)
GFR calc Af Amer: 59 mL/min — ABNORMAL LOW (ref >60)
GFR calc non Af Amer: 51 mL/min — ABNORMAL LOW (ref >60)
Glucose, Bld: 93 mg/dL (ref 70–99)
Potassium: 4 mmol/L (ref 3.5–5.1)
Sodium: 136 mmol/L (ref 135–145)

## 2019-10-18 LAB — CBC
HCT: 41.4 % (ref 39.0–52.0)
Hemoglobin: 13.8 g/dL (ref 13.0–17.0)
MCH: 36.4 pg — ABNORMAL HIGH (ref 26.0–34.0)
MCHC: 33.3 g/dL (ref 30.0–36.0)
MCV: 109.2 fL — ABNORMAL HIGH (ref 80.0–100.0)
Platelets: 138 10*3/uL — ABNORMAL LOW (ref 150–400)
RBC: 3.79 MIL/uL — ABNORMAL LOW (ref 4.22–5.81)
RDW: 14.5 % (ref 11.5–15.5)
WBC: 5.9 10*3/uL (ref 4.0–10.5)
nRBC: 0 % (ref 0.0–0.2)

## 2019-10-18 MED ORDER — FUROSEMIDE 40 MG PO TABS
60.0000 mg | ORAL_TABLET | Freq: Two times a day (BID) | ORAL | Status: DC
  Administered 2019-10-18 – 2019-10-19 (×3): 60 mg via ORAL
  Filled 2019-10-18 (×3): qty 1

## 2019-10-18 NOTE — Progress Notes (Signed)
Patient has refused CPAP again tonight.  No distress noted, will continue to monitor.

## 2019-10-18 NOTE — Progress Notes (Signed)
[facility]  PROGRESS NOTE  Mike Price [id number] DOB: 1935-05-16 DOA: 10/13/2019 PCP: Sueanne Margarita, DO Admit date - 10/13/2019   Admitting Physician Mariel Aloe, MD  Outpatient Primary MD for the patient is Sueanne Margarita, DO  LOS - 3 Brief Narrative   Mike Price is a 85 y.o. year old male with medical history significant for CAD status post CABG, CHB status post pacemaker, AF, HTN who presented on 10/13/2019 with progressive dyspnea, generalized weakness, frequent falls presumed related to leg "giving out" who was admitted initially to the ED for work-up of possible stroke. Patient admits to not taking his home lasix very often despite his PCP advising increase in dose ( PCP plan of care note in chart)  ED Course:   Neurology evaluated and based off negative CT scan and nonfocal neurologic exam ruled out stroke.  Patient was initially a planned discharge in the ED but while working with physical therapy had dyspnea with hypoxia requiring supplemental oxygen 2 L via nasal cannula.  Chest x-ray obtained consistent with vascular congestion and patient was brought in under observation for acute hypoxic respiratory failure secondary to CHF exacerbation.  Hospital Course: Patient was given IV Lasix overnight and admitted observation with hope for improvement in respiratory status in 24 hours.  However, patient has continued to have persistent conversational dyspnea, O2 requirements, 2+ pitting edema bilateral lower extremities up to sacrum warranting status being changed from observation to inpatient for continued medical treatment with IV Lasix, close monitoring of kidney function and symptomatic improvement.     Subjective  Breathing continues to improve. No chest pain. Hopeful to get out of bed to chair. Had 2 BMs overnight   A & P  Acute Hypoxic respiratory failure secondary to CHF exacerbation, improving Net -7 L hospital stay Leg edema to bilateral upper thighs is  improved significantly -Wean O2 to maintain SPO2 greater than 92%,  ambulatory O2 test, --mobilize out of bed to chair, incentive spirometry -transition to po lasix 60 mg BID and monitor -Closely monitor output, continue to monitor electrolytes (K greater than 4, mag greater than 2), kidney function -Daily weights, fluid restrict, strict I's and O's -Low-sodium diet  Recurrent falls Patient has chronic weakness in left leg.  Is very likely recurrent falls related to increased edema from above Once more euvolemic would advise rechecking orthostatics -Otherwise treat above -PT already evaluated patient, recommends home health PT/24 H assist   AKI on CKD Stage 3, in setting of CHF exacerbation Baseline creatinine around 1-1.08.  Cr currently 1.3,  -We will closely monitor kidney function, monitor output while on diuresis -Holding home losartan and other nephrotoxins  Hypertension, at goal Has had some soft BP with SBP 108-110.  -Holding home losartan (given AKI) -Continue home Lopressor, closely monitor  Chronic thrombocytopenia Stable at baseline.  No signs or symptoms of bleeding -Daily CBC  Chronic medical problems, stable Hyperlipidemia,- Lipitor, Zetia BPH,- doxazosin Paroxysmal atrial fibrillation with history of complete heart block status post pacemaker, rate controlled,- Eliquis, Lopressor Hypothyroidism,- continue Synthroid GERD,- PPI Neuropathy,- home gabapentin MGUS (2016), follow with medical oncology for active surveillance     Family Communication  : Wife updated at bedside on 1/9, will update on 1/12  Code Status : DNR confirmed on day of admission  Disposition Plan  : if volume status/breathing improves/remains stable on oral lasix can discharge on 1/13. Needs ambulatory O2 test to ensure no O2 needs prior to discharge  Consults  : Neurology  Procedures  : TTE, 1/9  DVT Prophylaxis  : Eliquis  Lab Results  Component Value Date   PLT 120 (L)  10/17/2019    Diet :  Diet Order            Diet Heart Room service appropriate? Yes; Fluid consistency: Thin  Diet effective now               Inpatient Medications Scheduled Meds: . apixaban  5 mg Oral BID  . atorvastatin  40 mg Oral QPM  . doxazosin  1 mg Oral QHS  . ezetimibe  10 mg Oral QPM  . furosemide  60 mg Intravenous BID  . gabapentin  100 mg Oral TID  . levothyroxine  100 mcg Oral Q0600  . metoprolol tartrate  100 mg Oral BID  . pantoprazole  40 mg Oral Daily  . polyethylene glycol  17 g Oral BID  . senna-docusate  2 tablet Oral BID  . sodium chloride flush  3 mL Intravenous Once   Continuous Infusions:  PRN Meds:.acetaminophen **OR** acetaminophen, clonazepam, LORazepam, ondansetron **OR** ondansetron (ZOFRAN) IV, sodium chloride, traMADol  Antibiotics  :   Anti-infectives (From admission, onward)   None       Objective   Vitals:   10/17/19 1534 10/17/19 1538 10/17/19 2014 10/18/19 0453  BP: (!) 115/47 (!) 108/49 (!) 111/47 (!) 122/58  Pulse: 69 69 74 70  Resp:   20 20  Temp: 98 F (36.7 C)  98.2 F (36.8 C) 98.2 F (36.8 C)  TempSrc: Oral  Oral Oral  SpO2: 97%  95% 96%  Weight:    104.6 kg  Height:        SpO2: 96 % O2 Flow Rate (L/min): 2 L/min  Wt Readings from Last 3 Encounters:  10/18/19 104.6 kg  09/15/19 115 kg  03/16/19 111.6 kg     Intake/Output Summary (Last 24 hours) at 10/18/2019 0532 Last data filed at 10/18/2019 0454 Gross per 24 hour  Intake 720 ml  Output 2200 ml  Net -1480 ml    Physical Exam:  Awake Alert, Oriented X 3, Normal affect No new F.N deficits,  .AT, No JVD appreciated No crackles at bases, normal respiratory effort on 2L RRR,No Gallops,Rubs or new Murmurs,  Scant pitting edema from bilateral ankle up to thighs(much improved from prior exams) Chronic skin changes of bilateral lower legs   I have personally reviewed the following:   Data Reviewed:  CBC Recent Labs  Lab 10/13/19 1343  10/14/19 1735 10/15/19 0252 10/17/19 1006  WBC 5.9 6.1 6.2 6.4  HGB 13.8 13.3 12.6* 13.5  HCT 42.9 40.0 38.2* 40.3  PLT 99* 105* 100* 120*  MCV 112.9* 111.1* 111.0* 108.0*  MCH 36.3* 36.9* 36.6* 36.2*  MCHC 32.2 33.3 33.0 33.5  RDW 14.9 14.8 15.0 14.6  LYMPHSABS  --  1.1  --   --   MONOABS  --  0.8  --   --   EOSABS  --  0.2  --   --   BASOSABS  --  0.0  --   --     Chemistries  Recent Labs  Lab 10/13/19 1343 10/14/19 1735 10/15/19 0252 10/16/19 0320 10/16/19 1028 10/17/19 1006  NA 141 137 139 136  --  134*  K 4.5 4.6 3.8 3.6  --  4.0  CL 103 99 102 95*  --  93*  CO2 31 29 29 31   --  31  GLUCOSE 119* 113* 115* 90  --  197*  BUN 28* 22 22 21   --  26*  CREATININE 1.42* 1.37* 1.46* 1.24  --  1.41*  CALCIUM 9.1 8.5* 8.6* 8.4*  --  8.5*  MG  --   --   --   --  1.6*  --   AST  --  41  --   --   --   --   ALT  --  17  --   --   --   --   ALKPHOS  --  164*  --   --   --   --   BILITOT  --  3.4*  --   --   --   --    ------------------------------------------------------------------------------------------------------------------ No results for input(s): CHOL, HDL, LDLCALC, TRIG, CHOLHDL, LDLDIRECT in the last 72 hours.  Lab Results  Component Value Date   HGBA1C 5.9 (H) 06/19/2015   ------------------------------------------------------------------------------------------------------------------ No results for input(s): TSH, T4TOTAL, T3FREE, THYROIDAB in the last 72 hours.  Invalid input(s): FREET3 ------------------------------------------------------------------------------------------------------------------ No results for input(s): VITAMINB12, FOLATE, FERRITIN, TIBC, IRON, RETICCTPCT in the last 72 hours.  Coagulation profile No results for input(s): INR, PROTIME in the last 168 hours.  No results for input(s): DDIMER in the last 72 hours.  Cardiac Enzymes No results for input(s): CKMB, TROPONINI, MYOGLOBIN in the last 168 hours.  Invalid input(s):  CK ------------------------------------------------------------------------------------------------------------------    Component Value Date/Time   BNP 431.5 (H) 10/14/2019 1730    Micro Results Recent Results (from the past 240 hour(s))  SARS CORONAVIRUS 2 (TAT 6-24 HRS) Nasopharyngeal Nasopharyngeal Swab     Status: None   Collection Time: 10/13/19  6:34 PM   Specimen: Nasopharyngeal Swab  Result Value Ref Range Status   SARS Coronavirus 2 NEGATIVE NEGATIVE Final    Comment: (NOTE) SARS-CoV-2 target nucleic acids are NOT DETECTED. The SARS-CoV-2 RNA is generally detectable in upper and lower respiratory specimens during the acute phase of infection. Negative results do not preclude SARS-CoV-2 infection, do not rule out co-infections with other pathogens, and should not be used as the sole basis for treatment or other patient management decisions. Negative results must be combined with clinical observations, patient history, and epidemiological information. The expected result is Negative. Fact Sheet for Patients: SugarRoll.be Fact Sheet for Healthcare Providers: https://www.woods-mathews.com/ This test is not yet approved or cleared by the [place] FDA and  has been authorized for detection and/or diagnosis of SARS-CoV-2 by FDA under an Emergency Use Authorization (EUA). This EUA will remain  in effect (meaning this test can be used) for the duration of the COVID-19 declaration under Section 56 4(b)(1) of the Act, 21 U.S.C. section 360bbb-3(b)(1), unless the authorization is terminated or revoked sooner. Performed at [facility], [place] [address], [place], [place] [address]     Radiology Reports DG Chest 2 View  Result Date: 10/14/2019 CLINICAL DATA:  Shortness of breath, difficulty breathing EXAM: CHEST - 2 VIEW COMPARISON:  08/04/2017, CT abdomen and pelvis from 08/16/2019 FINDINGS: Cardiomediastinal contours remain  enlarged. Changes of median sternotomy and CABG are redemonstrated. Left-sided dual lead pacer device in place, power pack over left chest. Signs of vascular congestion increased interstitial markings bilaterally. No dense consolidation. No signs of pleural effusion on AP or on lateral view. Visualized skeletal structures are unremarkable. IMPRESSION: 1. Signs of vascular congestion and interstitial edema. 2. Cardiomegaly. Electronically Signed   By: Zetta Bills M.D.   On: 10/14/2019 16:09   DG Tibia/Fibula Left  Result Date: 10/13/2019 CLINICAL  DATA:  Leg weakness EXAM: LEFT TIBIA AND FIBULA - 2 VIEW COMPARISON:  None. FINDINGS: No fracture or malalignment. Vascular calcifications. Diffuse edema within the lower leg. Ossicle or old fracture at the medial malleolus. Advanced arthritis medial joint space of the knee. IMPRESSION: No acute osseous abnormality. Electronically Signed   By: Donavan Foil M.D.   On: 10/13/2019 19:20   CT HEAD WO CONTRAST  Result Date: 10/14/2019 CLINICAL DATA:  Frequent falls, multiple in MS assessment yesterday EXAM: CT HEAD WITHOUT CONTRAST TECHNIQUE: Contiguous axial images were obtained from the base of the skull through the vertex without intravenous contrast. COMPARISON:  CT 12/17/2012 FINDINGS: Brain: No evidence of acute infarction, hemorrhage, hydrocephalus, extra-axial collection or mass lesion/mass effect. Symmetric prominence of the ventricles, cisterns and sulci compatible with parenchymal volume loss. Confluent areas of white matter hypoattenuation are most compatible with chronic microvascular angiopathy. Senescent mineralization of the basal ganglia predominantly within the globus pallidi, a benign incidental finding. Vascular: Atherosclerotic calcification of the carotid siphons and intradural vertebral arteries. No hyperdense vessel. Skull: No significant scalp swelling or calvarial fracture. No suspicious osseous lesions. Sinuses/Orbits: Minimal mucosal  thickening in the maxillary sinuses. Remainder of the paranasal sinuses and mastoid air cells are predominantly clear. Orbital structures are unremarkable aside from prior lens extractions. Other: Debris present within the external auditory canals. Mild bilateral TMJ arthrosis, left slightly greater than right. IMPRESSION: 1. No acute intracranial abnormality. 2. Advanced parenchymal volume loss and chronic microvascular angiopathy, similar to prior. 3. Debris in the external auditory canals, consider assessment for cerumen impaction. Electronically Signed   By: Lovena Le M.D.   On: 10/14/2019 06:27   DG CHEST PORT 1 VIEW  Result Date: 10/15/2019 CLINICAL DATA:  Pulmonary vascular congestion. Negative COVID-19 study. EXAM: PORTABLE CHEST 1 VIEW COMPARISON:  October 14, 2019 FINDINGS: Stable cardiomegaly. Stable pacemaker. The hila and mediastinum are normal. Pulmonary edema is identified, similar in the interval. No other acute abnormalities are noted. IMPRESSION: Cardiomegaly and pulmonary edema are stable in the interval. Electronically Signed   By: Dorise Bullion III M.D   On: 10/15/2019 11:57   ECHOCARDIOGRAM COMPLETE  Result Date: 10/15/2019   ECHOCARDIOGRAM REPORT   Patient Name:   Mike Price Date of Exam: 10/15/2019 Medical Rec #:  [id number]       Height:       71.0 in Accession #:    [id number]      Weight:       242.7 lb Date of Birth:  1935-10-03       BSA:          2.29 m Patient Age:    69 years        BP:           115/59 mmHg Patient Gender: M               HR:           70 bpm. Exam Location:  Inpatient Procedure: 2D Echo, Cardiac Doppler and Color Doppler Indications:    I51.7 Cardiomegaly  History:        Patient has prior history of Echocardiogram examinations, most                 recent 05/25/2014. CAD, Pacemaker; Risk Factors:Sleep Apnea,                 Dyslipidemia and Hypertension. Dysrhythmia. GERD.  Hypothyroidism.  Sonographer:    Jonelle Sidle Dance Referring Phys:  [place]  1. Left ventricular ejection fraction, by visual estimation, is 60 to 65%. The left ventricle has normal function. There is no left ventricular hypertrophy.  2. Left ventricular diastolic parameters are indeterminate.  3. The left ventricle has no regional wall motion abnormalities.  4. Global right ventricle has normal systolic function.The right ventricular size is normal.  5. Left atrial size was mildly dilated.  6. Right atrial size was mildly dilated.  7. The mitral valve is normal in structure. Moderate to severe mitral valve regurgitation. No evidence of mitral stenosis.  8. The tricuspid valve is normal in structure.  9. The aortic valve is tricuspid. Aortic valve regurgitation is trivial. Mild aortic valve sclerosis without stenosis. 10. The pulmonic valve was normal in structure. Pulmonic valve regurgitation is mild. 11. Moderately elevated pulmonary artery systolic pressure. 12. A pacer wire is visualized. 13. The inferior vena cava is dilated in size with <50% respiratory variability, suggesting right atrial pressure of 15 mmHg. 14. Normal LV systolic function; mild biatrial enlargement; moderate to severe MR; moderate TR; moderate pulmonary hypertension. FINDINGS  Left Ventricle: Left ventricular ejection fraction, by visual estimation, is 60 to 65%. The left ventricle has normal function. The left ventricle has no regional wall motion abnormalities. There is no left ventricular hypertrophy. Left ventricular diastolic parameters are indeterminate. Normal left atrial pressure. Right Ventricle: The right ventricular size is normal.Global RV systolic function is has normal systolic function. The tricuspid regurgitant velocity is 3.04 m/s, and with an assumed right atrial pressure of 15 mmHg, the estimated right ventricular systolic pressure is moderately elevated at 51.8 mmHg. Left Atrium: Left atrial size was mildly dilated. Right Atrium: Right atrial size was mildly  dilated Pericardium: There is no evidence of pericardial effusion. Mitral Valve: The mitral valve is normal in structure. Moderate to severe mitral valve regurgitation. No evidence of mitral valve stenosis by observation. Tricuspid Valve: The tricuspid valve is normal in structure. Tricuspid valve regurgitation moderate. Aortic Valve: The aortic valve is tricuspid. Aortic valve regurgitation is trivial. Mild aortic valve sclerosis is present, with no evidence of aortic valve stenosis. Pulmonic Valve: The pulmonic valve was normal in structure. Pulmonic valve regurgitation is mild. Pulmonic regurgitation is mild. Aorta: The aortic root is normal in size and structure. Venous: The inferior vena cava is dilated in size with less than 50% respiratory variability, suggesting right atrial pressure of 15 mmHg. IAS/Shunts: No atrial level shunt detected by color flow Doppler. Additional Comments: Normal LV systolic function; mild biatrial enlargement; moderate to severe MR; moderate TR; moderate pulmonary hypertension. A pacer wire is visualized.  LEFT VENTRICLE PLAX 2D LVIDd:         4.92 cm LVIDs:         3.45 cm LV PW:         1.34 cm LV IVS:        0.90 cm LVOT diam:     2.00 cm LV SV:         65 ml LV SV Index:   27.16 LVOT Area:     3.14 cm  RIGHT VENTRICLE            IVC RV Basal diam:  3.51 cm    IVC diam: 2.92 cm RV Mid diam:    2.65 cm RV S prime:     6.86 cm/s TAPSE (M-mode): 1.3 cm LEFT ATRIUM  Index       RIGHT ATRIUM           Index LA diam:        5.80 cm 2.53 cm/m  RA Area:     26.20 cm LA Vol (A2C):   98.0 ml 42.81 ml/m RA Volume:   85.10 ml  37.17 ml/m LA Vol (A4C):   58.3 ml 25.47 ml/m LA Biplane Vol: 75.7 ml 33.07 ml/m  AORTIC VALVE LVOT Vmax:   108.00 cm/s LVOT Vmean:  68.100 cm/s LVOT VTI:    0.212 m  AORTA Ao Root diam: 3.20 cm Ao Asc diam:  3.10 cm MITRAL VALVE                         TRICUSPID VALVE MV Area (PHT): 3.42 cm              TR Peak grad:   36.8 mmHg MV PHT:        64.38  msec            TR Vmax:        312.00 cm/s MV Decel Time: 222 msec MR Peak grad:    63.8 mmHg           SHUNTS MR Mean grad:    39.0 mmHg           Systemic VTI:  0.21 m MR Vmax:         399.50 cm/s         Systemic Diam: 2.00 cm MR Vmean:        289.5 cm/s MR PISA:         1.57 cm MR PISA Eff ROA: 15 mm MR PISA Radius:  0.50 cm MV E velocity: 127.00 cm/s 103 cm/s MV A velocity: 47.50 cm/s  70.3 cm/s MV E/A ratio:  2.67        1.5  Kirk Ruths MD Electronically signed by Kirk Ruths MD Signature Date/Time: 10/15/2019/12:58:40 PM    Final    CUP PACEART REMOTE DEVICE CHECK  Result Date: 10/12/2019 Scheduled remote reviewed.  Normal device function.  Next remote 91 days.  VAS [place] LOWER EXTREMITY VENOUS (DVT) (ONLY [facility] & [facility])  Result Date: 10/16/2019  Lower Venous Study Indications: Swelling.  Limitations: Body habitus and poor ultrasound/tissue interface. Comparison Study: Lower extremity venous duplex 05-20-16 negative. Performing Technologist: Baldwin Crown ARDMS, RVT  Examination Guidelines: A complete evaluation includes B-mode imaging, spectral Doppler, color Doppler, and power Doppler as needed of all accessible portions of each vessel. Bilateral testing is considered an integral part of a complete examination. Limited examinations for reoccurring indications may be performed as noted.  +---------+---------------+---------+-----------+----------+--------------+ RIGHT    CompressibilityPhasicitySpontaneityPropertiesThrombus Aging +---------+---------------+---------+-----------+----------+--------------+ CFV      Full           Yes      Yes                                 +---------+---------------+---------+-----------+----------+--------------+ SFJ      Full                                                        +---------+---------------+---------+-----------+----------+--------------+ FV Prox  Full                                                         +---------+---------------+---------+-----------+----------+--------------+  FV Mid   Full                                                        +---------+---------------+---------+-----------+----------+--------------+ FV DistalFull                                                        +---------+---------------+---------+-----------+----------+--------------+ PFV      Full                                                        +---------+---------------+---------+-----------+----------+--------------+ POP      Full           Yes      Yes                                 +---------+---------------+---------+-----------+----------+--------------+ PTV      Full                                                        +---------+---------------+---------+-----------+----------+--------------+ PERO     Full                                                        +---------+---------------+---------+-----------+----------+--------------+   +---------+---------------+---------+-----------+----------+--------------+ LEFT     CompressibilityPhasicitySpontaneityPropertiesThrombus Aging +---------+---------------+---------+-----------+----------+--------------+ CFV      Full           Yes      Yes                                 +---------+---------------+---------+-----------+----------+--------------+ SFJ      Full                                                        +---------+---------------+---------+-----------+----------+--------------+ FV Prox  Full                                                        +---------+---------------+---------+-----------+----------+--------------+ FV Mid   Full                                                        +---------+---------------+---------+-----------+----------+--------------+  FV DistalFull                                                         +---------+---------------+---------+-----------+----------+--------------+ PFV      Full                                                        +---------+---------------+---------+-----------+----------+--------------+ POP      Full           Yes      Yes                                 +---------+---------------+---------+-----------+----------+--------------+ PTV      Full                                                        +---------+---------------+---------+-----------+----------+--------------+ PERO     Full                                                        +---------+---------------+---------+-----------+----------+--------------+ Limited visualization of bilateral lower extremity veins due to patient body habitus and swelling.    Summary: Right: There is no evidence of deep vein thrombosis in the lower extremity. No cystic structure found in the popliteal fossa. Left: There is no evidence of deep vein thrombosis in the lower extremity. No cystic structure found in the popliteal fossa.  *See table(s) above for measurements and observations. Electronically signed by Harold Barban MD on 10/16/2019 at 10:49:11 AM.    Final      Time Spent in minutes  30     Desiree Hane M.D on 10/18/2019 at 5:32 AM  To page go to [email or web address] - password [email or web address]

## 2019-10-18 NOTE — Discharge Instructions (Signed)

## 2019-10-19 DIAGNOSIS — D7589 Other specified diseases of blood and blood-forming organs: Secondary | ICD-10-CM

## 2019-10-19 DIAGNOSIS — I5033 Acute on chronic diastolic (congestive) heart failure: Secondary | ICD-10-CM

## 2019-10-19 LAB — CBC
HCT: 43.8 % (ref 39.0–52.0)
Hemoglobin: 14.6 g/dL (ref 13.0–17.0)
MCH: 36 pg — ABNORMAL HIGH (ref 26.0–34.0)
MCHC: 33.3 g/dL (ref 30.0–36.0)
MCV: 108.1 fL — ABNORMAL HIGH (ref 80.0–100.0)
Platelets: 145 10*3/uL — ABNORMAL LOW (ref 150–400)
RBC: 4.05 MIL/uL — ABNORMAL LOW (ref 4.22–5.81)
RDW: 14.3 % (ref 11.5–15.5)
WBC: 6.1 10*3/uL (ref 4.0–10.5)
nRBC: 0 % (ref 0.0–0.2)

## 2019-10-19 LAB — BASIC METABOLIC PANEL
Anion gap: 10 (ref 5–15)
BUN: 28 mg/dL — ABNORMAL HIGH (ref 8–23)
CO2: 31 mmol/L (ref 22–32)
Calcium: 8.5 mg/dL — ABNORMAL LOW (ref 8.9–10.3)
Chloride: 96 mmol/L — ABNORMAL LOW (ref 98–111)
Creatinine, Ser: 1.26 mg/dL — ABNORMAL HIGH (ref 0.61–1.24)
GFR calc Af Amer: >60 mL/min (ref >60)
GFR calc non Af Amer: 53 mL/min — ABNORMAL LOW (ref >60)
Glucose, Bld: 98 mg/dL (ref 70–99)
Potassium: 3.8 mmol/L (ref 3.5–5.1)
Sodium: 137 mmol/L (ref 135–145)

## 2019-10-19 MED ORDER — FUROSEMIDE 20 MG PO TABS: 40.0000 mg | ORAL_TABLET | Freq: Every day | ORAL | 1 refills | Status: DC

## 2019-10-19 NOTE — Progress Notes (Signed)
Paged [place]. BP 104/53 HR 70. ok to hold metoprolol 100 mg? Upon MD callback at 1048 ok to hold.

## 2019-10-19 NOTE — TOC Transition Note (Signed)
Transition of Care [facility]) - CM/SW Discharge Note   Patient Details  Name: Mike Price MRN: [id number] Date of Birth: [birth year]  Transition of Care [facility]) CM/SW Contact:  Zenon Mayo, RN Phone Number: 10/19/2019, 12:04 PM   Clinical Narrative:    Patient for dc today, NCM spoke with wife she states they are already active with [facility], this was confirmed by Mateo Flow with [facility], she will be set up with HHRN, HHPT, [place], [place], Research officer, political party.  Soc will begin 24 to 48 hrs post dc. She states they do not want the w/chair , but they do want the 3 n 1.  NCM contacted Zack to pick up the w/chair that was in patient's room.    Final next level of care: [place] Barriers to Discharge: No Barriers Identified   Patient Goals and CMS Choice Patient states their goals for this hospitalization and ongoing recovery are:: go home CMS Medicare.gov Compare Post Acute Care list provided to:: Patient Choice offered to / list presented to : Patient, Spouse  Discharge Placement                       Discharge Plan and Services                DME Arranged: 3-N-1 DME Agency: [employer] Date DME Agency Contacted: 10/19/19 Time DME Agency Contacted: 1203 Representative spoke with at DME Agency: zack HH Arranged: RN, PT, OT, Nurse's Aide, Social Work [employer] Agency: [place] ([place]) Date [place]: 10/19/19 Time [place]: 1203 Representative spoke with at [place]: [place] ([place]) Interventions     Readmission Risk Interventions No flowsheet data found.

## 2019-10-19 NOTE — Care Management Important Message (Signed)
Important Message  Patient Details  Name: Mike Price MRN: [id number] Date of Birth: Feb 13, 1935   Medicare Important Message Given:  Yes     Shelda Altes 10/19/2019, 11:35 AM

## 2019-10-19 NOTE — Discharge Summary (Signed)
Physician Discharge Summary  Mike Price [id number] DOB: September 04, 1935 DOA: 10/13/2019  PCP: Sueanne Margarita, DO  Admit date: 10/13/2019 Discharge date: 10/19/2019  Admitted From: home Disposition:  home  Recommendations for Outpatient Follow-up:  1. Follow up with PCP in 1-2 weeks 2. Please obtain BMP/CBC in one week  Home Health: PT, OT, RN Equipment/Devices: walker  Discharge Condition: stable CODE STATUS: DNR Diet recommendation: low sodium  HPI: Per admitting MD, Mike Price is a 85 y.o. male with medical history significant of CAD s/p CABG, complete heart block s/p pacemaker, atrial fibrillation, hypothyroidism, hypertension, MGUS. Patient initially presented to the ED secondary to frequent falls from his left leg "giving out". Patient underwent workup for possible stroke which was negative. Venous duplex was negative as well. While in the ED, patient received a 1L bolus of LR. Today, while ambulating with physical therapy, patient had dyspnea with associated hypoxia requiring 2 L of oxygen via nasal canula. ED Course: Vitals: Afebrile, normal pulse and respirations. Normotensive. On 2 L via  Labs: Pending today; Creatinine of 1.42 and platelets of 99 from yesterday Imaging: Chest x-ray significant for interstitial edema/vascular congestion and cardiomegaly Medications/Course: Home medications  Hospital Course / Discharge diagnoses: Acute Hypoxic respiratory failure secondary to CHF exacerbation, improving - patient with significant fluid overload on admission, placed on diuresis with excellent response, weight has improved from 250 lbs to 224. Edema resolved and appears euvolemic. His renal function has remained stable and he is tolerating oral diuretics with good UOP. He will be discharged home in stable condition and advised to follow up with PCP. His ARB will be held until seen as an outpatient Recurrent falls -PT already evaluated patient, recommends home health PT/24 H  assist, will arrange AKI on CKD Stage 3, in setting of CHF exacerbation - Baseline creatinine around 1-1.08. Cr at baseline on discharge. Hold ARB on d/c Hypertension, at goal - continue home medications except ARB Chronic thrombocytopenia - Stable at baseline.  No signs or symptoms of bleeding Hyperlipidemia- Lipitor, Zetia BPH- doxazosin Paroxysmal atrial fibrillation with history of complete heart block status post pacemaker, rate controlled- Eliquis, Lopressor Hypothyroidism- continue Synthroid GERD- PPI Neuropathy- home gabapentin MGUS (2016)-follow with medical oncology for active surveillance  Discharge Instructions   Allergies as of 10/19/2019      Reactions   Mefloquine Other (See Comments)   "made me crazy" anxious, upset   Lisinopril Cough      Medication List    STOP taking these medications   cephALEXin 500 MG capsule Commonly known as: KEFLEX   losartan 100 MG tablet Commonly known as: COZAAR     TAKE these medications   atorvastatin 40 MG tablet Commonly known as: LIPITOR Take 40 mg by mouth every evening.   clonazePAM 0.5 MG tablet Commonly known as: KLONOPIN Take 0.25-0.5 mg by mouth 3 (three) times daily.   doxazosin 2 MG tablet Commonly known as: CARDURA Take 1 mg by mouth at bedtime.   Eliquis 5 MG Tabs tablet Generic drug: apixaban TAKE ONE TABLET TWICE DAILY What changed: how much to take   eucerin cream Apply 1 application topically daily as needed for dry skin.   ezetimibe 10 MG tablet Commonly known as: ZETIA Take 10 mg by mouth every evening.   furosemide 20 MG tablet Commonly known as: LASIX Take 2 tablets (40 mg total) by mouth daily. What changed: how much to take   gabapentin 100 MG capsule Commonly known as: NEURONTIN TAKE 1 CAPSULE  THREE TIMES A DAY (MUST HAVE APPOINTMENT FOR FURTHER REFILLS) What changed: See the new instructions.   metoprolol tartrate 100 MG tablet Commonly known as: LOPRESSOR Take 100 mg by mouth 2  (two) times daily.   NON FORMULARY 1 each by Other route at bedtime. CPAP   omeprazole 40 MG capsule Commonly known as: PRILOSEC Take 20 mg by mouth daily.   SALONPAS PAIN RELIEF PATCH EX Apply 0.4 % topically daily as needed (pain).   Synthroid 100 MCG tablet Generic drug: levothyroxine Take 100 mcg by mouth daily before breakfast.   traMADol 50 MG tablet Commonly known as: ULTRAM Take 50 mg by mouth as needed.            Durable Medical Equipment  (From admission, onward)         Start     Ordered   10/14/19 1711  For home use only DME lightweight manual wheelchair with seat cushion  Once    Comments: Patient suffers from weakness which impairs their ability to perform daily activities like  in the home.  A walker will not resolve  issue with performing activities of daily living. A wheelchair will allow patient to safely perform daily activities. Patient is not able to propel themselves in the home using a standard weight wheelchair due to weakness. Patient can self propel in the lightweight wheelchair. Length of need 6 months Accessories: elevating leg rests (ELRs), wheel locks, extensions and anti-tippers.   10/14/19 1721   10/14/19 1221  For home use only DME 3 n 1  Once    Comments: With elongated seat   10/14/19 1226           Consultations:  None   Procedures/Studies:  2D echo IMPRESSIONS    1. Left ventricular ejection fraction, by visual estimation, is 60 to 65%. The left ventricle has normal function. There is no left ventricular hypertrophy.  2. Left ventricular diastolic parameters are indeterminate.  3. The left ventricle has no regional wall motion abnormalities.  4. Global right ventricle has normal systolic function.The right ventricular size is normal.  5. Left atrial size was mildly dilated.  6. Right atrial size was mildly dilated.  7. The mitral valve is normal in structure. Moderate to severe mitral valve regurgitation. No evidence  of mitral stenosis.  8. The tricuspid valve is normal in structure.  9. The aortic valve is tricuspid. Aortic valve regurgitation is trivial. Mild aortic valve sclerosis without stenosis. 10. The pulmonic valve was normal in structure. Pulmonic valve regurgitation is mild. 11. Moderately elevated pulmonary artery systolic pressure. 12. A pacer wire is visualized. 13. The inferior vena cava is dilated in size with <50% respiratory variability, suggesting right atrial pressure of 15 mmHg. 14. Normal LV systolic function; mild biatrial enlargement; moderate to severe MR; moderate TR; moderate pulmonary hypertension.  DG Chest 2 View  Result Date: 10/14/2019 CLINICAL DATA:  Shortness of breath, difficulty breathing EXAM: CHEST - 2 VIEW COMPARISON:  08/04/2017, CT abdomen and pelvis from 08/16/2019 FINDINGS: Cardiomediastinal contours remain enlarged. Changes of median sternotomy and CABG are redemonstrated. Left-sided dual lead pacer device in place, power pack over left chest. Signs of vascular congestion increased interstitial markings bilaterally. No dense consolidation. No signs of pleural effusion on AP or on lateral view. Visualized skeletal structures are unremarkable. IMPRESSION: 1. Signs of vascular congestion and interstitial edema. 2. Cardiomegaly. Electronically Signed   By: Zetta Bills M.D.   On: 10/14/2019 16:09   DG Tibia/Fibula Left  Result Date: 10/13/2019 CLINICAL DATA:  Leg weakness EXAM: LEFT TIBIA AND FIBULA - 2 VIEW COMPARISON:  None. FINDINGS: No fracture or malalignment. Vascular calcifications. Diffuse edema within the lower leg. Ossicle or old fracture at the medial malleolus. Advanced arthritis medial joint space of the knee. IMPRESSION: No acute osseous abnormality. Electronically Signed   By: Donavan Foil M.D.   On: 10/13/2019 19:20   CT HEAD WO CONTRAST  Result Date: 10/14/2019 CLINICAL DATA:  Frequent falls, multiple in MS assessment yesterday EXAM: CT HEAD WITHOUT  CONTRAST TECHNIQUE: Contiguous axial images were obtained from the base of the skull through the vertex without intravenous contrast. COMPARISON:  CT 12/17/2012 FINDINGS: Brain: No evidence of acute infarction, hemorrhage, hydrocephalus, extra-axial collection or mass lesion/mass effect. Symmetric prominence of the ventricles, cisterns and sulci compatible with parenchymal volume loss. Confluent areas of white matter hypoattenuation are most compatible with chronic microvascular angiopathy. Senescent mineralization of the basal ganglia predominantly within the globus pallidi, a benign incidental finding. Vascular: Atherosclerotic calcification of the carotid siphons and intradural vertebral arteries. No hyperdense vessel. Skull: No significant scalp swelling or calvarial fracture. No suspicious osseous lesions. Sinuses/Orbits: Minimal mucosal thickening in the maxillary sinuses. Remainder of the paranasal sinuses and mastoid air cells are predominantly clear. Orbital structures are unremarkable aside from prior lens extractions. Other: Debris present within the external auditory canals. Mild bilateral TMJ arthrosis, left slightly greater than right. IMPRESSION: 1. No acute intracranial abnormality. 2. Advanced parenchymal volume loss and chronic microvascular angiopathy, similar to prior. 3. Debris in the external auditory canals, consider assessment for cerumen impaction. Electronically Signed   By: Lovena Le M.D.   On: 10/14/2019 06:27   DG CHEST PORT 1 VIEW  Result Date: 10/15/2019 CLINICAL DATA:  Pulmonary vascular congestion. Negative COVID-19 study. EXAM: PORTABLE CHEST 1 VIEW COMPARISON:  October 14, 2019 FINDINGS: Stable cardiomegaly. Stable pacemaker. The hila and mediastinum are normal. Pulmonary edema is identified, similar in the interval. No other acute abnormalities are noted. IMPRESSION: Cardiomegaly and pulmonary edema are stable in the interval. Electronically Signed   By: Dorise Bullion III  M.D   On: 10/15/2019 11:57   ECHOCARDIOGRAM COMPLETE  Result Date: 10/15/2019   ECHOCARDIOGRAM REPORT   Patient Name:   BRAXTAN LACAP Date of Exam: 10/15/2019 Medical Rec #:  [id number]       Height:       71.0 in Accession #:    [id number]      Weight:       242.7 lb Date of Birth:  18-Apr-1935       BSA:          2.29 m Patient Age:    85 years        BP:           115/59 mmHg Patient Gender: M               HR:           70 bpm. Exam Location:  Inpatient Procedure: 2D Echo, Cardiac Doppler and Color Doppler Indications:    I51.7 Cardiomegaly  History:        Patient has prior history of Echocardiogram examinations, most                 recent 05/25/2014. CAD, Pacemaker; Risk Factors:Sleep Apnea,                 Dyslipidemia and Hypertension. Dysrhythmia. GERD.  Hypothyroidism.  Sonographer:    Jonelle Sidle Dance Referring Phys: [place]  1. Left ventricular ejection fraction, by visual estimation, is 60 to 65%. The left ventricle has normal function. There is no left ventricular hypertrophy.  2. Left ventricular diastolic parameters are indeterminate.  3. The left ventricle has no regional wall motion abnormalities.  4. Global right ventricle has normal systolic function.The right ventricular size is normal.  5. Left atrial size was mildly dilated.  6. Right atrial size was mildly dilated.  7. The mitral valve is normal in structure. Moderate to severe mitral valve regurgitation. No evidence of mitral stenosis.  8. The tricuspid valve is normal in structure.  9. The aortic valve is tricuspid. Aortic valve regurgitation is trivial. Mild aortic valve sclerosis without stenosis. 10. The pulmonic valve was normal in structure. Pulmonic valve regurgitation is mild. 11. Moderately elevated pulmonary artery systolic pressure. 12. A pacer wire is visualized. 13. The inferior vena cava is dilated in size with <50% respiratory variability, suggesting right atrial pressure of 15 mmHg. 14.  Normal LV systolic function; mild biatrial enlargement; moderate to severe MR; moderate TR; moderate pulmonary hypertension. FINDINGS  Left Ventricle: Left ventricular ejection fraction, by visual estimation, is 60 to 65%. The left ventricle has normal function. The left ventricle has no regional wall motion abnormalities. There is no left ventricular hypertrophy. Left ventricular diastolic parameters are indeterminate. Normal left atrial pressure. Right Ventricle: The right ventricular size is normal.Global RV systolic function is has normal systolic function. The tricuspid regurgitant velocity is 3.04 m/s, and with an assumed right atrial pressure of 15 mmHg, the estimated right ventricular systolic pressure is moderately elevated at 51.8 mmHg. Left Atrium: Left atrial size was mildly dilated. Right Atrium: Right atrial size was mildly dilated Pericardium: There is no evidence of pericardial effusion. Mitral Valve: The mitral valve is normal in structure. Moderate to severe mitral valve regurgitation. No evidence of mitral valve stenosis by observation. Tricuspid Valve: The tricuspid valve is normal in structure. Tricuspid valve regurgitation moderate. Aortic Valve: The aortic valve is tricuspid. Aortic valve regurgitation is trivial. Mild aortic valve sclerosis is present, with no evidence of aortic valve stenosis. Pulmonic Valve: The pulmonic valve was normal in structure. Pulmonic valve regurgitation is mild. Pulmonic regurgitation is mild. Aorta: The aortic root is normal in size and structure. Venous: The inferior vena cava is dilated in size with less than 50% respiratory variability, suggesting right atrial pressure of 15 mmHg. IAS/Shunts: No atrial level shunt detected by color flow Doppler. Additional Comments: Normal LV systolic function; mild biatrial enlargement; moderate to severe MR; moderate TR; moderate pulmonary hypertension. A pacer wire is visualized.  LEFT VENTRICLE PLAX 2D LVIDd:         4.92  cm LVIDs:         3.45 cm LV PW:         1.34 cm LV IVS:        0.90 cm LVOT diam:     2.00 cm LV SV:         65 ml LV SV Index:   27.16 LVOT Area:     3.14 cm  RIGHT VENTRICLE            IVC RV Basal diam:  3.51 cm    IVC diam: 2.92 cm RV Mid diam:    2.65 cm RV S prime:     6.86 cm/s TAPSE (M-mode): 1.3 cm LEFT ATRIUM  Index       RIGHT ATRIUM           Index LA diam:        5.80 cm 2.53 cm/m  RA Area:     26.20 cm LA Vol (A2C):   98.0 ml 42.81 ml/m RA Volume:   85.10 ml  37.17 ml/m LA Vol (A4C):   58.3 ml 25.47 ml/m LA Biplane Vol: 75.7 ml 33.07 ml/m  AORTIC VALVE LVOT Vmax:   108.00 cm/s LVOT Vmean:  68.100 cm/s LVOT VTI:    0.212 m  AORTA Ao Root diam: 3.20 cm Ao Asc diam:  3.10 cm MITRAL VALVE                         TRICUSPID VALVE MV Area (PHT): 3.42 cm              TR Peak grad:   36.8 mmHg MV PHT:        64.38 msec            TR Vmax:        312.00 cm/s MV Decel Time: 222 msec MR Peak grad:    63.8 mmHg           SHUNTS MR Mean grad:    39.0 mmHg           Systemic VTI:  0.21 m MR Vmax:         399.50 cm/s         Systemic Diam: 2.00 cm MR Vmean:        289.5 cm/s MR PISA:         1.57 cm MR PISA Eff ROA: 15 mm MR PISA Radius:  0.50 cm MV E velocity: 127.00 cm/s 103 cm/s MV A velocity: 47.50 cm/s  70.3 cm/s MV E/A ratio:  2.67        1.5  Kirk Ruths MD Electronically signed by Kirk Ruths MD Signature Date/Time: 10/15/2019/12:58:40 PM    Final    CUP PACEART REMOTE DEVICE CHECK  Result Date: 10/12/2019 Scheduled remote reviewed.  Normal device function.  Next remote 91 days.  VAS [place] LOWER EXTREMITY VENOUS (DVT) (ONLY [facility] & [facility])  Result Date: 10/16/2019  Lower Venous Study Indications: Swelling.  Limitations: Body habitus and poor ultrasound/tissue interface. Comparison Study: Lower extremity venous duplex 05-20-16 negative. Performing Technologist: Baldwin Crown ARDMS, RVT  Examination Guidelines: A complete evaluation includes B-mode imaging, spectral Doppler, color  Doppler, and power Doppler as needed of all accessible portions of each vessel. Bilateral testing is considered an integral part of a complete examination. Limited examinations for reoccurring indications may be performed as noted.  +---------+---------------+---------+-----------+----------+--------------+ RIGHT    CompressibilityPhasicitySpontaneityPropertiesThrombus Aging +---------+---------------+---------+-----------+----------+--------------+ CFV      Full           Yes      Yes                                 +---------+---------------+---------+-----------+----------+--------------+ SFJ      Full                                                        +---------+---------------+---------+-----------+----------+--------------+ FV Prox  Full                                                        +---------+---------------+---------+-----------+----------+--------------+  FV Mid   Full                                                        +---------+---------------+---------+-----------+----------+--------------+ FV DistalFull                                                        +---------+---------------+---------+-----------+----------+--------------+ PFV      Full                                                        +---------+---------------+---------+-----------+----------+--------------+ POP      Full           Yes      Yes                                 +---------+---------------+---------+-----------+----------+--------------+ PTV      Full                                                        +---------+---------------+---------+-----------+----------+--------------+ PERO     Full                                                        +---------+---------------+---------+-----------+----------+--------------+   +---------+---------------+---------+-----------+----------+--------------+ LEFT      CompressibilityPhasicitySpontaneityPropertiesThrombus Aging +---------+---------------+---------+-----------+----------+--------------+ CFV      Full           Yes      Yes                                 +---------+---------------+---------+-----------+----------+--------------+ SFJ      Full                                                        +---------+---------------+---------+-----------+----------+--------------+ FV Prox  Full                                                        +---------+---------------+---------+-----------+----------+--------------+ FV Mid   Full                                                        +---------+---------------+---------+-----------+----------+--------------+  FV DistalFull                                                        +---------+---------------+---------+-----------+----------+--------------+ PFV      Full                                                        +---------+---------------+---------+-----------+----------+--------------+ POP      Full           Yes      Yes                                 +---------+---------------+---------+-----------+----------+--------------+ PTV      Full                                                        +---------+---------------+---------+-----------+----------+--------------+ PERO     Full                                                        +---------+---------------+---------+-----------+----------+--------------+ Limited visualization of bilateral lower extremity veins due to patient body habitus and swelling.    Summary: Right: There is no evidence of deep vein thrombosis in the lower extremity. No cystic structure found in the popliteal fossa. Left: There is no evidence of deep vein thrombosis in the lower extremity. No cystic structure found in the popliteal fossa.  *See table(s) above for measurements and observations. Electronically signed  by Harold Barban MD on 10/16/2019 at 10:49:11 AM.    Final      Subjective: - no chest pain, shortness of breath, no abdominal pain, nausea or vomiting.   Discharge Exam: BP (!) 100/52 (BP Location: Left Arm)   Pulse 69   Temp 98.6 F (37 C) (Oral)   Resp 20   Ht 5\' 11"  (1.803 m)   Wt 101.9 kg   SpO2 (!) 89%   BMI 31.33 kg/m   General: Pt is alert, awake, not in acute distress Cardiovascular: RRR, S1/S2 +, no rubs, no gallops Respiratory: CTA bilaterally, no wheezing, no rhonchi Abdominal: Soft, NT, ND, bowel sounds + Extremities: no edema, no cyanosis    The results of significant diagnostics from this hospitalization (including imaging, microbiology, ancillary and laboratory) are listed below for reference.     Microbiology: Recent Results (from the past 240 hour(s))  SARS CORONAVIRUS 2 (TAT 6-24 HRS) Nasopharyngeal Nasopharyngeal Swab     Status: None   Collection Time: 10/13/19  6:34 PM   Specimen: Nasopharyngeal Swab  Result Value Ref Range Status   SARS Coronavirus 2 NEGATIVE NEGATIVE Final    Comment: (NOTE) SARS-CoV-2 target nucleic acids are NOT DETECTED. The SARS-CoV-2 RNA is generally detectable in upper and lower respiratory specimens during the acute phase of infection.  Negative results do not preclude SARS-CoV-2 infection, do not rule out co-infections with other pathogens, and should not be used as the sole basis for treatment or other patient management decisions. Negative results must be combined with clinical observations, patient history, and epidemiological information. The expected result is Negative. Fact Sheet for Patients: SugarRoll.be Fact Sheet for Healthcare Providers: https://www.woods-mathews.com/ This test is not yet approved or cleared by the [place] FDA and  has been authorized for detection and/or diagnosis of SARS-CoV-2 by FDA under an Emergency Use Authorization (EUA). This EUA will  remain  in effect (meaning this test can be used) for the duration of the COVID-19 declaration under Section 56 4(b)(1) of the Act, 21 U.S.C. section 360bbb-3(b)(1), unless the authorization is terminated or revoked sooner. Performed at [facility], [place] [address], [place], [place] [address]      Labs: Basic Metabolic Panel: Recent Labs  Lab 10/15/19 0252 10/16/19 0320 10/16/19 1028 10/17/19 1006 10/18/19 0639 10/19/19 0425  NA 139 136  --  134* 136 137  K 3.8 3.6  --  4.0 4.0 3.8  CL 102 95*  --  93* 94* 96*  CO2 29 31  --  31 31 31   GLUCOSE 115* 90  --  197* 93 98  BUN 22 21  --  26* 28* 28*  CREATININE 1.46* 1.24  --  1.41* 1.30* 1.26*  CALCIUM 8.6* 8.4*  --  8.5* 8.3* 8.5*  MG  --   --  1.6*  --   --   --    Liver Function Tests: Recent Labs  Lab 10/14/19 1735  AST 41  ALT 17  ALKPHOS 164*  BILITOT 3.4*  PROT 6.3*  ALBUMIN 2.8*   CBC: Recent Labs  Lab 10/14/19 1735 10/15/19 0252 10/17/19 1006 10/18/19 0639 10/19/19 0425  WBC 6.1 6.2 6.4 5.9 6.1  NEUTROABS 4.0  --   --   --   --   HGB 13.3 12.6* 13.5 13.8 14.6  HCT 40.0 38.2* 40.3 41.4 43.8  MCV 111.1* 111.0* 108.0* 109.2* 108.1*  PLT 105* 100* 120* 138* 145*   CBG: Recent Labs  Lab 10/13/19 1719  GLUCAP 73   Hgb A1c No results for input(s): HGBA1C in the last 72 hours. Lipid Profile No results for input(s): CHOL, HDL, LDLCALC, TRIG, CHOLHDL, LDLDIRECT in the last 72 hours. Thyroid function studies No results for input(s): TSH, T4TOTAL, T3FREE, THYROIDAB in the last 72 hours.  Invalid input(s): FREET3 Urinalysis    Component Value Date/Time   COLORURINE AMBER (A) 10/13/2019 1708   APPEARANCEUR CLEAR 10/13/2019 1708   LABSPEC 1.021 10/13/2019 1708   PHURINE 6.0 10/13/2019 1708   GLUCOSEU NEGATIVE 10/13/2019 1708   HGBUR NEGATIVE 10/13/2019 1708   BILIRUBINUR NEGATIVE 10/13/2019 1708   KETONESUR NEGATIVE 10/13/2019 1708   PROTEINUR 100 (A) 10/13/2019 1708   UROBILINOGEN 1.0  01/11/2013 1100   NITRITE NEGATIVE 10/13/2019 1708   LEUKOCYTESUR NEGATIVE 10/13/2019 1708    FURTHER DISCHARGE INSTRUCTIONS:   Get Medicines reviewed and adjusted: Please take all your medications with you for your next visit with your Primary MD   Laboratory/radiological data: Please request your Primary MD to go over all hospital tests and procedure/radiological results at the follow up, please ask your Primary MD to get all Hospital records sent to his/her office.   In some cases, they will be blood work, cultures and biopsy results pending at the time of your discharge. Please request that your primary care  M.D. goes through all the records of your hospital data and follows up on these results.   Also Note the following: If you experience worsening of your admission symptoms, develop shortness of breath, life threatening emergency, suicidal or homicidal thoughts you must seek medical attention immediately by calling 911 or calling your MD immediately  if symptoms less severe.   You must read complete instructions/literature along with all the possible adverse reactions/side effects for all the Medicines you take and that have been prescribed to you. Take any new Medicines after you have completely understood and accpet all the possible adverse reactions/side effects.    Do not drive when taking Pain medications or sleeping medications (Benzodaizepines)   Do not take more than prescribed Pain, Sleep and Anxiety Medications. It is not advisable to combine anxiety,sleep and pain medications without talking with your primary care practitioner   Special Instructions: If you have smoked or chewed Tobacco  in the last 2 yrs please stop smoking, stop any regular Alcohol  and or any Recreational drug use.   Wear Seat belts while driving.   Please note: You were cared for by a hospitalist during your hospital stay. Once you are discharged, your primary care physician will handle any further  medical issues. Please note that NO REFILLS for any discharge medications will be authorized once you are discharged, as it is imperative that you return to your primary care physician (or establish a relationship with a primary care physician if you do not have one) for your post hospital discharge needs so that they can reassess your need for medications and monitor your lab values.  Time coordinating discharge: 35 minutes  SIGNED:  Marzetta Board, MD, PhD 10/19/2019, 9:32 AM

## 2019-11-16 ENCOUNTER — Ambulatory Visit (INDEPENDENT_AMBULATORY_CARE_PROVIDER_SITE_OTHER): Payer: Medicare Other | Admitting: Podiatry

## 2019-11-16 ENCOUNTER — Encounter: Payer: Self-pay | Admitting: Podiatry

## 2019-11-16 ENCOUNTER — Other Ambulatory Visit: Payer: Self-pay

## 2019-11-16 DIAGNOSIS — B351 Tinea unguium: Secondary | ICD-10-CM | POA: Diagnosis not present

## 2019-11-16 DIAGNOSIS — M79675 Pain in left toe(s): Secondary | ICD-10-CM | POA: Diagnosis not present

## 2019-11-16 DIAGNOSIS — M79674 Pain in right toe(s): Secondary | ICD-10-CM

## 2019-11-16 DIAGNOSIS — I878 Other specified disorders of veins: Secondary | ICD-10-CM | POA: Diagnosis not present

## 2019-11-16 DIAGNOSIS — G629 Polyneuropathy, unspecified: Secondary | ICD-10-CM | POA: Diagnosis not present

## 2019-11-16 NOTE — Progress Notes (Signed)
Complaint:  Visit Type: Patient returns to my office for continued preventative foot care services. Complaint: Patient states" my nails have grown long and thick and become painful to walk and wear shoes" Patient has been diagnosed with neuropathy and is taking gabapentin.. The patient presents for preventative foot care services. .  Patient is taking eliquiss. Patient is in a wheelchair.  Podiatric Exam: Vascular: dorsalis pedis and posterior tibial pulses are absent  bilateral. Capillary return is immediate. Temperature gradient is WNL. Skin turgor WNL  Sensorium: Diminished  Semmes Weinstein monofilament test. Diminished  tactile sensation bilaterally. Nail Exam: Pt has thick disfigured discolored nails with subungual debris noted bilateral entire nail hallux through fifth toenails Ulcer Exam: There is no evidence of ulcer or pre-ulcerative changes or infection. Orthopedic Exam: Muscle tone and strength are WNL. No limitations in general ROM. No crepitus or effusions noted. Foot type and digits show no abnormalities. Tailors bunion 5th left foot. Skin: No Porokeratosis. No infection or ulcers.  Red  Thin shiny skin dorsolateral aspect fifth metatarsal left foot.  Diagnosis:  Onychomycosis, , Pain in right toe, pain in left toes  Treatment & Plan Procedures and Treatment: Consent by patient was obtained for treatment procedures.   Debridement of mycotic and hypertrophic toenails, 1 through 5 bilateral and clearing of subungual debris. No ulceration, no infection noted.  Return Visit-Office Procedure: Patient instructed to return to the office for a follow up visit 3 months for continued evaluation and treatment.    Gardiner Barefoot DPM

## 2019-12-03 ENCOUNTER — Ambulatory Visit: Payer: Medicare Other | Attending: Internal Medicine

## 2019-12-03 DIAGNOSIS — Z23 Encounter for immunization: Secondary | ICD-10-CM | POA: Insufficient documentation

## 2019-12-03 NOTE — Progress Notes (Signed)
   Covid-19 Vaccination Clinic  Name:  Mike Price    MRN: [id number] DOB: 08-Mar-1935  12/03/2019  Mr. Novacek was observed post Covid-19 immunization for 15 minutes without incidence. He was provided with Vaccine Information Sheet and instruction to access the V-Safe system.   Mr. Noor was instructed to call 911 with any severe reactions post vaccine: [name] Kitchen Difficulty breathing  . Swelling of your face and throat  . A fast heartbeat  . A bad rash all over your body  . Dizziness and weakness    Immunizations Administered    Name Date Dose VIS Date Route   Pfizer COVID-19 Vaccine 12/03/2019  5:43 PM 0.3 mL 09/16/2019 Intramuscular   Manufacturer: [place]   Lot: [id number]   [place]: [id number]

## 2020-01-09 ENCOUNTER — Ambulatory Visit (INDEPENDENT_AMBULATORY_CARE_PROVIDER_SITE_OTHER): Payer: Medicare Other | Admitting: *Deleted

## 2020-01-09 DIAGNOSIS — I442 Atrioventricular block, complete: Secondary | ICD-10-CM | POA: Diagnosis not present

## 2020-01-09 LAB — CUP PACEART REMOTE DEVICE CHECK
Battery Impedance: 208 Ohm
Battery Remaining Longevity: 138 mo
Battery Voltage: 2.79 V
Brady Statistic RV Percent Paced: 99 %
Date Time Interrogation Session: 20210405140652
Implantable Lead Implant Date: 20080311
Implantable Lead Implant Date: 20080311
Implantable Lead Location: 753859
Implantable Lead Location: 753860
Implantable Lead Model: 5076
Implantable Lead Model: 5076
Implantable Pulse Generator Implant Date: 20161206
Lead Channel Impedance Value: 650 Ohm
Lead Channel Impedance Value: 67 Ohm
Lead Channel Pacing Threshold Amplitude: 1 V
Lead Channel Pacing Threshold Pulse Width: 0.4 ms
Lead Channel Setting Pacing Amplitude: 2 V
Lead Channel Setting Pacing Pulse Width: 0.4 ms
Lead Channel Setting Sensing Sensitivity: 2.8 mV

## 2020-01-10 NOTE — Progress Notes (Signed)
PPM Remote  

## 2020-01-24 ENCOUNTER — Encounter: Payer: Self-pay | Admitting: Cardiovascular Disease

## 2020-01-24 ENCOUNTER — Other Ambulatory Visit: Payer: Self-pay

## 2020-01-24 ENCOUNTER — Ambulatory Visit (INDEPENDENT_AMBULATORY_CARE_PROVIDER_SITE_OTHER): Payer: Medicare Other | Admitting: Cardiovascular Disease

## 2020-01-24 VITALS — BP 119/68 | HR 75 | Temp 97.4°F | Resp 20 | Ht 71.0 in | Wt 233.2 lb

## 2020-01-24 DIAGNOSIS — I25810 Atherosclerosis of coronary artery bypass graft(s) without angina pectoris: Secondary | ICD-10-CM

## 2020-01-24 DIAGNOSIS — I442 Atrioventricular block, complete: Secondary | ICD-10-CM | POA: Diagnosis not present

## 2020-01-24 DIAGNOSIS — I340 Nonrheumatic mitral (valve) insufficiency: Secondary | ICD-10-CM

## 2020-01-24 DIAGNOSIS — I4821 Permanent atrial fibrillation: Secondary | ICD-10-CM

## 2020-01-24 DIAGNOSIS — I10 Essential (primary) hypertension: Secondary | ICD-10-CM

## 2020-01-24 DIAGNOSIS — Z950 Presence of cardiac pacemaker: Secondary | ICD-10-CM

## 2020-01-24 DIAGNOSIS — Z7901 Long term (current) use of anticoagulants: Secondary | ICD-10-CM

## 2020-01-24 DIAGNOSIS — I5032 Chronic diastolic (congestive) heart failure: Secondary | ICD-10-CM | POA: Diagnosis not present

## 2020-01-24 NOTE — Patient Instructions (Signed)
Medication Instructions:  STOP the Zetia *If you need a refill on your cardiac medications before your next appointment, please call your pharmacy*   Lab Work: None ordered If you have labs (blood work) drawn today and your tests are completely normal, you will receive your results only by: [name] Kitchen MyChart Message (if you have MyChart) OR . A paper copy in the mail If you have any lab test that is abnormal or we need to change your treatment, we will call you to review the results.   Testing/Procedures: None ordered   Follow-Up: At [facility], you and your health needs are our priority.  As part of our continuing mission to provide you with exceptional heart care, we have created designated Provider Care Teams.  These Care Teams include your primary Cardiologist (physician) and Advanced Practice Providers (APPs -  Physician Assistants and Nurse Practitioners) who all work together to provide you with the care you need, when you need it.  We recommend signing up for the patient portal called "MyChart".  Sign up information is provided on this After Visit Summary.  MyChart is used to connect with patients for Virtual Visits (Telemedicine).  Patients are able to view lab/test results, encounter notes, upcoming appointments, etc.  Non-urgent messages can be sent to your provider as well.   To learn more about what you can do with MyChart, go to [email or web address].    Your next appointment:   3 month(s)  The format for your next appointment:   In Person  Provider:   You may see Sanda Klein, MD or one of the following Advanced Practice Providers on your designated Care Team:    Almyra Deforest, PA-C  Fabian Sharp, [place] or   Roby Lofts, [place]    Other Instructions Please call if your weight gets 235 pounds or above.

## 2020-01-24 NOTE — Progress Notes (Signed)
Cardiology Office  Note    Evaluation Performed:  Follow-up visit  Date:  01/27/2020   ID:  [name], [name] 1935/04/30, MRN [id number]  PCP:  Sueanne Margarita, DO  Cardiologist:  Daiva Nakayama (pacemaker) Electrophysiologist:  None   Chief Complaint:  Pacemaker follow up  History of Present Illness:    Mike Price is a 85 y.o. male with permanent atrial fibrillation and complete heart block (pacemaker dependent). He has remote CAD issues (CABG x 4 , 2000), followed by Dr. Gwenlyn Found.  Additional medical problems include treated hypothyroidism, essential hypertension and monoclonal gammopathy of uncertain significance.  Mike Price was hospitalized in January with marked fluid overload.  He presented with frequent falls and underwent a negative work-up for possible stroke.  Chest x-ray and BNP were consistent with heart failure and he required oxygen supplementation initially.  Cardiology was not consulted during that hospitalization.  Improved after diuresis of about 25 pounds.  Discharge weight was 224 pounds.  His weight has been steady at home at around 230 pounds on his home scale (3 pounds less than our office scale today).  BNP was 431 when he was admitted, but there is no previous baseline for comparison.  The echocardiogram performed during that hospital stay shows new moderate to severe mitral insufficiency.  Left ventricular systolic function remains normal with EF of 60-65%.  The estimated systolic PA pressure during that hospitalization was 52 mmHg, estimated RA pressure of 15 mmHg.  He is moving very slowly leaning heavily on his walker and is very cautious.  He complains of a variety of musculoskeletal issues and urinary leakage, unchanged from before.  He does not have angina pectoris, orthopnea, PND, palpitations, dizziness, syncope, focal neurological events or bleeding problems.  He is compliant with anticoagulation with Eliquis.  Pacemaker download shows normal device  function.  His generator longevity is estimated at 11.5 years and he has 99.4% ventricular pacing (underlying permanent atrial fibrillation with complete heart block and occasional PVCs).  Mike Price reports that he will be moving to [facility] in the next 1 or 2 months.  The patient does not have symptoms concerning for COVID-19 infection (fever, chills, cough, or new shortness of breath).    Past Medical History:  Diagnosis Date  . Anemia   . Anxiety 01-11-13   tx. Clonazepam.  . Arthritis    Osteoarthritis-knees, fingers  . CAD (coronary artery disease)   . Diverticulitis   . Dysrhythmia 01-11-13    hx. A. Fib-Pacemaker implanted left chest  . Edema extremities 01-11-13   retains fluid in legs-right greater than left.  [name] Kitchen GERD (gastroesophageal reflux disease)   . Hyperlipemia   . Hypertension   . Hypothyroidism   [name] 07/10/2015   IgA 542 mg% 06/19/15. IgA lambda paraprotein on IFE, normal IgG & IgM  . Neuromuscular disorder (HCC)    neuropathy in feet  . Obstructive sleep apnea    on C Pap  . Pacemaker 12/15/06   3'08  . Thyroid disease   . TMJ tenderness 01-11-13   right side-has issues from time to time.   Past Surgical History:  Procedure Laterality Date  . APPENDECTOMY    . CARDIAC CATHETERIZATION  03/28/08   patent grafts, nl EF  . CARPAL TUNNEL RELEASE Right 2018  . CERVICAL LAMINECTOMY    . CORONARY ARTERY BYPASS GRAFT  2001   4 vessels-'00  . EP IMPLANTABLE DEVICE N/A 09/11/2015   Procedure: PPM Generator Changeout;  Surgeon: Sanda Klein, MD;  Location: [facility];  Service: Cardiovascular;  Laterality: N/A;  . HERNIA REPAIR    . INSERT / REPLACE / REMOVE PACEMAKER     '08-inserted left chest  . JOINT REPLACEMENT     LTHA  . NM MYOCAR PERF WALL MOTION  02/12/2012   small fixed distal anteroapical/apical defect. No reversible ischemia  . PACEMAKER INSERTION  12/15/06   medtronic  . TONSILLECTOMY     child  .  TOTAL KNEE ARTHROPLASTY Right 01/17/2013   Procedure: RIGHT TOTAL KNEE ARTHROPLASTY;  Surgeon: Gearlean Alf, MD;  Location: [facility];  Service: Orthopedics;  Laterality: Right;  . US ECHOCARDIOGRAPHY  12/28/2007   LA mod. dilated,RA mildly dilated     Current Meds  Medication Sig  . atorvastatin (LIPITOR) 40 MG tablet Take 40 mg by mouth every evening.   . clonazePAM (KLONOPIN) 0.5 MG tablet Take 0.25-0.5 mg by mouth 3 (three) times daily.   [name] Kitchen doxazosin (CARDURA) 2 MG tablet Take 1 mg by mouth at bedtime.  [name] Kitchen ELIQUIS 5 MG TABS tablet TAKE ONE TABLET TWICE DAILY (Patient taking differently: Take 5 mg by mouth 2 (two) times daily. )  . furosemide (LASIX) 20 MG tablet Take 2 tablets (40 mg total) by mouth daily.  [name] Kitchen gabapentin (NEURONTIN) 100 MG capsule TAKE 1 CAPSULE THREE TIMES A DAY (MUST HAVE APPOINTMENT FOR FURTHER REFILLS) (Patient taking differently: Take 100 mg by mouth at bedtime. )  . Liniments (SALONPAS PAIN RELIEF PATCH EX) Apply 0.4 % topically daily as needed (pain).   . metoprolol (LOPRESSOR) 100 MG tablet Take 100 mg by mouth 2 (two) times daily.  . NON FORMULARY 1 each by Other route at bedtime. CPAP  . omeprazole (PRILOSEC) 40 MG capsule Take 20 mg by mouth daily.   . Skin Protectants, Misc. (EUCERIN) cream Apply 1 application topically daily as needed for dry skin.  [name] Kitchen SYNTHROID 100 MCG tablet Take 100 mcg by mouth daily before breakfast.   . traMADol (ULTRAM) 50 MG tablet Take 50 mg by mouth as needed.  . [DISCONTINUED] ezetimibe (ZETIA) 10 MG tablet Take 10 mg by mouth every evening.      Allergies:   Mefloquine and Lisinopril   Social History   Tobacco Use  . Smoking status: Former Smoker    Quit date: 01/12/1984    Years since quitting: 36.0  . Smokeless tobacco: Never Used  Substance Use Topics  . Alcohol use: Yes    Alcohol/week: 3.0 standard drinks    Types: 3 Shots of liquor per week    Comment: 2-3 drinks daily  . Drug use: No     Family Hx: The patient's  family history includes Stroke in an other family member. There is no history of Neuropathy.  ROS:   Please see the history of present illness.     All other systems reviewed and are negative.   Prior CV studies:   The following studies were reviewed today:  Full pacemaker download performed by me in the office today Echcardiogram 10/15/2019 compared to previous echo from 20158   Labs/Other Tests and Data Reviewed:    EKG:   Recent Labs: 10/14/2019: ALT 17; B Natriuretic Peptide 431.5 10/16/2019: Magnesium 1.6 10/19/2019: BUN 28; Creatinine, Ser 1.26; Hemoglobin 14.6; Platelets 145; Potassium 3.8; Sodium 137   Recent Lipid Panel No results found for: CHOL, TRIG, HDL, CHOLHDL, LDLCALC, LDLDIRECT   10/15/2019 Total cholesterol 71, HDL 33, triglycerides 75, LDL 30  Wt Readings from Last 3 Encounters:  01/24/20 233 lb 3.2 oz (105.8 kg)  10/19/19 224 lb 10.4 oz (101.9 kg)  09/15/19 253 lb 8 oz (115 kg)     Objective:    Vital Signs:  BP 119/68   Pulse 75   Temp (!) 97.4 F (36.3 C)   Resp 20   Ht 5\' 11"  (1.803 m)   Wt 233 lb 3.2 oz (105.8 kg)   SpO2 92%   BMI 32.52 kg/m     General: Alert, oriented x3, no distress, mildly obese.  Appears elderly and rather frail Head: no evidence of trauma, PERRL, EOMI, no exophtalmos or lid lag, no myxedema, no xanthelasma; normal ears, nose and oropharynx Neck: normal jugular venous pulsations and no hepatojugular reflux; brisk carotid pulses without delay and no carotid bruits Chest: clear to auscultation, no signs of consolidation by percussion or palpation, normal fremitus, symmetrical and full respiratory excursions Cardiovascular: normal position and quality of the apical impulse, regular rhythm, normal first and paradoxically split second heart sounds, [id number] holosystolic murmur at the apex, radiating towards the axilla; no diastolic murmurs, rubs or gallops.  Healthy left subclavian pacemaker site. Abdomen: no tenderness or  distention, no masses by palpation, no abnormal pulsatility or arterial bruits, normal bowel sounds, no hepatosplenomegaly Extremities: no clubbing, cyanosis; 1+ symmetrical pedal edema; 2+ radial, ulnar and brachial pulses bilaterally; 2+ right femoral, posterior tibial and dorsalis pedis pulses; 2+ left femoral, posterior tibial and dorsalis pedis pulses; no subclavian or femoral bruits Neurological: grossly nonfocal.  Walks with a very slow tentative gait using his walker. Psych: Normal mood and affect   ASSESSMENT & PLAN:    1. CHF: The only identifiable culprit appears to be mitral insufficiency, which is new compared to 2015.  He had signs and symptoms of both right and left heart failure when he was hospitalized in January, despite the fact that he has well-preserved left ventricular systolic function and does not have regional wall motion abnormalities.  He does not have angina pectoris to suggest coronary insufficiency.  He does not have significant left ventricular hypertrophy to suggest cardiac amyloidosis.  He has some dyssynchrony related to RV pacing, but he has had complete heart block for years with 100% pacing, without heart failure manifestations.  He has minimum signs of hypervolemia today, just slightly puffy feet. 2. MR: Mechanism unclear from the transthoracic echo.  The leaflets are mildly thickened, not unexpected for age 20, without the typical appearance of rheumatic or myxomatous degeneration.  The jet of mitral insufficiency appears to originate from the central commissure with a slight posterior eccentricity.  Would suggest repeating the transthoracic echo to see if MR has improved following treatment with diuretics which would suggest secondary MR.  If repeat imaging still shows moderate to severe or severe MR he might benefit from transesophageal echocardiography.  I do not think Mike Price is a candidate for redo heart surgery, but he might be a candidate for MitraClip.   Obviously, his move to [facility] will have some impact on where this evaluation and treatment is performed. 3. Permanent AFib: Longstanding abnormality, on appropriate anticoagulation without known embolic events.  CHA2DS2-VASc 5 (age 25, CHF, CAD, HTN). 4. CHB: Pacemaker dependent.  Despite RV apical pacing, left ventricular systolic function is normal. 5. Pacemaker: Normal device function.  Continue remote downloads every 3 months through our office until he finds a new cardiologist in [place]. 6. CAD: Well over 20 years since his bypass procedure.  Has preserved  left ventricular systolic function.  Denies angina pectoris.  On combination statin and ezetimibe with LDL cholesterol that is very low.  I think he can stop the ezetimibe. 7. HTN: well-controlled 8. Eliquis: Well-tolerated without bleeding complications   [id number] Education: The signs and symptoms of COVID-19 were discussed with the patient and how to seek care for testing (follow up with PCP or arrange E-visit).  The importance of social distancing was discussed today.  Time:   Today, I have spent 16 minutes with the patient with telehealth technology discussing the above problems.     Medication Adjustments/Labs and Tests Ordered: Current medicines are reviewed at length with the patient today.  Concerns regarding medicines are outlined above.   Tests Ordered: Orders Placed This Encounter  Procedures  . EKG 12-Lead    Medication Changes: No orders of the defined types were placed in this encounter.   Disposition:  Follow up 12 months  Signed, Sanda Klein, MD  01/27/2020 1:53 PM    [place]

## 2020-01-27 DIAGNOSIS — I5032 Chronic diastolic (congestive) heart failure: Secondary | ICD-10-CM | POA: Insufficient documentation

## 2020-01-27 DIAGNOSIS — I340 Nonrheumatic mitral (valve) insufficiency: Secondary | ICD-10-CM | POA: Insufficient documentation

## 2020-01-30 ENCOUNTER — Telehealth: Payer: Self-pay | Admitting: Cardiovascular Disease

## 2020-01-30 NOTE — Telephone Encounter (Signed)
Called and left vm on pts cell phone to call back, able to get in touch with pts wife and discussed Echo per DPR. Notified that per Dr.Croitoru that the pt still needs to have the echo completed. Reminded that it is on 02/02/20 at 1:45pm. Wife thankful for the call and verbalized understanding. No other questions at this time.

## 2020-01-30 NOTE — Telephone Encounter (Signed)
Patient is calling to make Dr. Sallyanne Kuster aware that he had an echocardiogram completed on 10/15/19. He would like to know if echocardiogram scheduled for 02/02/20 is still necessary. Please advise.

## 2020-02-02 ENCOUNTER — Other Ambulatory Visit: Payer: Self-pay

## 2020-02-02 ENCOUNTER — Ambulatory Visit (HOSPITAL_COMMUNITY): Payer: Medicare Other | Attending: Internal Medicine

## 2020-02-02 DIAGNOSIS — I4821 Permanent atrial fibrillation: Secondary | ICD-10-CM | POA: Diagnosis not present

## 2020-02-02 DIAGNOSIS — I340 Nonrheumatic mitral (valve) insufficiency: Secondary | ICD-10-CM | POA: Diagnosis not present

## 2020-02-02 DIAGNOSIS — I25810 Atherosclerosis of coronary artery bypass graft(s) without angina pectoris: Secondary | ICD-10-CM | POA: Diagnosis present

## 2020-02-02 DIAGNOSIS — I5032 Chronic diastolic (congestive) heart failure: Secondary | ICD-10-CM | POA: Insufficient documentation

## 2020-02-02 MED ORDER — PERFLUTREN LIPID MICROSPHERE
1.0000 mL | INTRAVENOUS | Status: AC | PRN
  Administered 2020-02-02: 2 mL via INTRAVENOUS

## 2020-02-06 ENCOUNTER — Telehealth: Payer: Self-pay | Admitting: Cardiovascular Disease

## 2020-02-06 MED ORDER — FUROSEMIDE 20 MG PO TABS: 60.0000 mg | ORAL_TABLET | Freq: Every day | ORAL | 1 refills | Status: AC

## 2020-02-06 MED ORDER — POTASSIUM CHLORIDE CRYS ER 20 MEQ PO TBCR: 20.0000 meq | EXTENDED_RELEASE_TABLET | Freq: Every day | ORAL | 3 refills | Status: AC

## 2020-02-06 NOTE — Telephone Encounter (Signed)
Patient would like to go over his Echo results with the nurse.

## 2020-02-06 NOTE — Telephone Encounter (Signed)
Returned call to patient to discuss echo results:   Croitoru, Mihai, MD  02/04/2020 9:51 AM EDT    Echo still shows a moderate leak in the mitral valve and I am concerned that we may be underestimating its severity. There is also still evidence of excess fluid. I would recommend increasing the furosemide to 60 mg daily and adding KCl 20 mEq daily.  I also think we need to better assess the mitral valve leak with transesophageal echo to see if it is severe enough to warrant fixing with a Mitraclip (anatomically I believe it would be suitable for a MitraClip). I did discuss this with him to some extent at the office appointment. I know Eduard Clos is moving to [place] soon. He may prefer to have the TEE workup (and if necessary, the treatment) down there. If he would like to go ahead with TEE in [place], I could do it on May 11 or May 13 or even May 15.   Patient aware of results.   Patient is unsure about procedure and request an in depth discussion with Dr. Sallyanne Kuster prior to deciding.   Advised Dr. Sallyanne Kuster is OOO this week, nurse is out today.   Advised would send message to primary nurse to see when possible to discuss with Dr. Loletha Grayer.    Patient moves to [facility] the end of the month.      He verbalized understanding of medication changes and rx sent to pharmacy for potassium.

## 2020-02-07 NOTE — Telephone Encounter (Signed)
The patient has been educated on the TEE and what is entails. He would like to speak to Dr. Sallyanne Kuster about the ECHO results and findings.   He has been made aware that Dr. Sallyanne Kuster is out of the office this week so it may be next week before he will be contacted. He has also been made aware that the TEE may not be possible here in [place] if he waits until next week to decide. He stated that he was fine to do this in [place] if need be since they are moving at the end of the month.

## 2020-02-07 NOTE — Telephone Encounter (Signed)
Left a message for the patient to call back.  

## 2020-02-07 NOTE — Telephone Encounter (Signed)
Patient returning call.

## 2020-02-13 NOTE — Telephone Encounter (Signed)
Attempted to call. The voicemail was full.

## 2020-02-13 NOTE — Telephone Encounter (Signed)
Tried to call. Voicemal full, could not leave message. It's quite appropriate to wait until [facility] to have the TEE. Does he need a referral to a local Cardiologist? I would suggest Casimer Lanius, MD at [facility]

## 2020-02-14 ENCOUNTER — Encounter: Payer: Self-pay | Admitting: Podiatry

## 2020-02-14 ENCOUNTER — Other Ambulatory Visit: Payer: Self-pay

## 2020-02-14 ENCOUNTER — Ambulatory Visit (INDEPENDENT_AMBULATORY_CARE_PROVIDER_SITE_OTHER): Payer: Medicare Other | Admitting: Podiatry

## 2020-02-14 VITALS — Temp 96.9°F

## 2020-02-14 DIAGNOSIS — M79675 Pain in left toe(s): Secondary | ICD-10-CM

## 2020-02-14 DIAGNOSIS — B351 Tinea unguium: Secondary | ICD-10-CM

## 2020-02-14 DIAGNOSIS — I878 Other specified disorders of veins: Secondary | ICD-10-CM

## 2020-02-14 DIAGNOSIS — G629 Polyneuropathy, unspecified: Secondary | ICD-10-CM

## 2020-02-14 DIAGNOSIS — M79674 Pain in right toe(s): Secondary | ICD-10-CM | POA: Diagnosis not present

## 2020-02-14 NOTE — Telephone Encounter (Signed)
The patient has been made aware for the recommendations for a cardiologist and to follow up with the TEE.

## 2020-02-14 NOTE — Progress Notes (Signed)
This patient returns to my office for at risk foot care.  This patient requires this care by a professional since this patient will be at risk due to having venous stasis  and coagulation defect.  Patient is taking eliquiss. This patient is unable to cut nails himself since the patient cannot reach him nails.These nails are painful walking and wearing shoes.  This patient presents for at risk foot care today.  General Appearance  Alert, conversant and in no acute stress.  Vascular  Dorsalis pedis   pulses are absent  bilaterally. Posterior tibial pulses are absent  B/L. Capillary return is within normal limits  bilaterally. Temperature is within normal limits  bilaterally.  Neurologic  Senn-Weinstein monofilament wire test diminished   bilaterally. Muscle power within normal limits bilaterally.  Nails Thick disfigured discolored nails with subungual debris  from hallux to fifth toes bilaterally. No evidence of bacterial infection or drainage bilaterally.  Pincer nails  B/L.  Orthopedic  No limitations of motion  feet .  No crepitus or effusions noted.  No bony pathology or digital deformities noted.  Tailors fifth toe left foot.  Skin  normotropic skin with no porokeratosis noted bilaterally.  No signs of infections or ulcers noted.     Onychomycosis  Pain in right toes  Pain in left toes  Consent was obtained for treatment procedures.   Mechanical debridement of nails 1-5  bilaterally performed with a nail nipper.  Filed with dremel without incident.    Return office visit    prn                 Told patient to return for periodic foot care and evaluation due to potential at risk complications.   Gardiner Barefoot DPM

## 2020-02-20 ENCOUNTER — Other Ambulatory Visit: Payer: Self-pay | Admitting: *Deleted

## 2020-02-20 MED ORDER — GABAPENTIN 100 MG PO CAPS: ORAL_CAPSULE | ORAL | 0 refills | Status: AC

## 2020-02-20 NOTE — Telephone Encounter (Signed)
Received Gabapentin 100 mg new Rx request from [facility], requesting 3 month supply. Pt needs a follow for further refills. Spoke with Dr. Jaynee Eagles. Will provide 1 month of refill with message stating pt needs an appt.

## 2020-03-08 ENCOUNTER — Telehealth: Payer: Self-pay | Admitting: Cardiovascular Disease

## 2020-03-08 NOTE — Telephone Encounter (Signed)
Patient called in with questions about rx and transferring prescriptions. Will call back once he has a pharmacy in his new area([place])

## 2020-03-12 ENCOUNTER — Telehealth: Payer: Self-pay

## 2020-03-12 NOTE — Telephone Encounter (Signed)
TC to Pt in reference to access nurse correspondence about Pt's upcoming appointment. Spoke with Pt who stated that he moved to [place]. Wanted to thank Dr.Shadad for everything.

## 2020-03-15 ENCOUNTER — Ambulatory Visit: Payer: Medicare Other | Admitting: Oncology

## 2020-03-15 ENCOUNTER — Other Ambulatory Visit: Payer: Medicare Other

## 2020-04-10 ENCOUNTER — Ambulatory Visit (INDEPENDENT_AMBULATORY_CARE_PROVIDER_SITE_OTHER): Payer: Medicare Other | Admitting: *Deleted

## 2020-04-10 DIAGNOSIS — I442 Atrioventricular block, complete: Secondary | ICD-10-CM

## 2020-04-11 LAB — CUP PACEART REMOTE DEVICE CHECK
Battery Impedance: 208 Ohm
Battery Remaining Longevity: 137 mo
Battery Voltage: 2.79 V
Brady Statistic RV Percent Paced: 100 %
Date Time Interrogation Session: 20210707125639
Implantable Lead Implant Date: 20080311
Implantable Lead Implant Date: 20080311
Implantable Lead Location: 753859
Implantable Lead Location: 753860
Implantable Lead Model: 5076
Implantable Lead Model: 5076
Implantable Pulse Generator Implant Date: 20161206
Lead Channel Impedance Value: 67 Ohm
Lead Channel Impedance Value: 684 Ohm
Lead Channel Pacing Threshold Amplitude: 1.125 V
Lead Channel Pacing Threshold Pulse Width: 0.4 ms
Lead Channel Setting Pacing Amplitude: 2.25 V
Lead Channel Setting Pacing Pulse Width: 0.4 ms
Lead Channel Setting Sensing Sensitivity: 2.8 mV

## 2020-04-12 NOTE — Progress Notes (Signed)
Remote pacemaker transmission.   

## 2020-05-09 ENCOUNTER — Telehealth: Payer: Self-pay | Admitting: Cardiovascular Disease

## 2020-05-09 NOTE — Telephone Encounter (Signed)
Patient has moved to [place]. 

## 2020-07-09 ENCOUNTER — Ambulatory Visit (INDEPENDENT_AMBULATORY_CARE_PROVIDER_SITE_OTHER): Payer: Medicare Other

## 2020-07-09 DIAGNOSIS — I442 Atrioventricular block, complete: Secondary | ICD-10-CM | POA: Diagnosis not present

## 2020-07-11 LAB — CUP PACEART REMOTE DEVICE CHECK
Battery Impedance: 208 Ohm
Battery Remaining Longevity: 139 mo
Battery Voltage: 2.79 V
Brady Statistic RV Percent Paced: 100 %
Date Time Interrogation Session: 20211005122114
Implantable Lead Implant Date: 20080311
Implantable Lead Implant Date: 20080311
Implantable Lead Location: 753859
Implantable Lead Location: 753860
Implantable Lead Model: 5076
Implantable Lead Model: 5076
Implantable Pulse Generator Implant Date: 20161206
Lead Channel Impedance Value: 67 Ohm
Lead Channel Impedance Value: 680 Ohm
Lead Channel Pacing Threshold Amplitude: 1 V
Lead Channel Pacing Threshold Pulse Width: 0.4 ms
Lead Channel Setting Pacing Amplitude: 2 V
Lead Channel Setting Pacing Pulse Width: 0.4 ms
Lead Channel Setting Sensing Sensitivity: 2.8 mV

## 2020-07-12 NOTE — Progress Notes (Signed)
Remote pacemaker transmission.   

## 2020-10-08 ENCOUNTER — Ambulatory Visit (INDEPENDENT_AMBULATORY_CARE_PROVIDER_SITE_OTHER): Payer: Medicare Other

## 2020-10-08 DIAGNOSIS — I442 Atrioventricular block, complete: Secondary | ICD-10-CM

## 2020-10-09 LAB — CUP PACEART REMOTE DEVICE CHECK
Battery Impedance: 232 Ohm
Battery Remaining Longevity: 135 mo
Battery Voltage: 2.79 V
Brady Statistic RV Percent Paced: 100 %
Date Time Interrogation Session: 20220104155424
Implantable Lead Implant Date: 20080311
Implantable Lead Implant Date: 20080311
Implantable Lead Location: 753859
Implantable Lead Location: 753860
Implantable Lead Model: 5076
Implantable Lead Model: 5076
Implantable Pulse Generator Implant Date: 20161206
Lead Channel Impedance Value: 67 Ohm
Lead Channel Impedance Value: 680 Ohm
Lead Channel Pacing Threshold Amplitude: 0.875 V
Lead Channel Pacing Threshold Pulse Width: 0.4 ms
Lead Channel Setting Pacing Amplitude: 2 V
Lead Channel Setting Pacing Pulse Width: 0.4 ms
Lead Channel Setting Sensing Sensitivity: 2.8 mV

## 2020-10-22 NOTE — Progress Notes (Signed)
Remote pacemaker transmission.   

## 2020-12-18 ENCOUNTER — Telehealth: Payer: Self-pay | Admitting: *Deleted

## 2020-12-18 NOTE — Telephone Encounter (Signed)
   [facility] Pre-operative Risk Assessment    HEARTCARE STAFF: - Please ensure there is not already an duplicate clearance open for this procedure. - Under Visit Info/Reason for Call, type in Other and utilize the format Clearance MM/DD/YY or Clearance TBD. Do not use dashes or single digits. - If request is for dental extraction, please clarify the # of teeth to be extracted.  Request for surgical clearance:  1. What type of surgery is being performed? colonoscopy   2. When is this surgery scheduled? TBD   3. What type of clearance is required (medical clearance vs. Pharmacy clearance to hold med vs. Both)? both  4. Are there any medications that need to be held prior to surgery and how long?eliquis-need direction   5. Practice name and name of physician performing surgery? Dr Deon Pilling   6. What is the office phone number? [phone]   7.   What is the office fax number? [phone]  8.   Anesthesia type (None, local, MAC, general) ? Not listed   Fredia Beets 12/18/2020, 4:57 PM  _________________________________________________________________   (provider comments below)

## 2020-12-19 NOTE — Telephone Encounter (Signed)
   Primary Cardiologist: Previously followed by Dr. Sallyanne Kuster  Chart reviewed as part of pre-operative protocol coverage.  It appears patient has relocated to [place] and established care with Dr. Herma Ard with [facility].   Left a voicemail for patient to call back to clarify who will be managing his cardiology care going forward. Suspect preop assessment will be deferred to his new cardiology group. Will await call back.    Abigail Butts, PA-C 12/19/2020, 1:18 PM

## 2020-12-24 NOTE — Telephone Encounter (Signed)
   Primary Cardiologist: Hazeline Junker Achtchi  Chart reviewed as part of pre-operative protocol coverage. I tried calling patient again and was able to reach him. He relocated to [place]. He clarified he is now followed by cardiologist in [place] named [place]. Will route to requesting provider to let them know clearance should be routed to his primary cardiologist. Please call with questions.  Charlie Pitter, PA-C 12/24/2020, 9:38 AM

## 2021-01-07 ENCOUNTER — Ambulatory Visit (INDEPENDENT_AMBULATORY_CARE_PROVIDER_SITE_OTHER): Payer: Medicare Other

## 2021-01-07 DIAGNOSIS — I442 Atrioventricular block, complete: Secondary | ICD-10-CM | POA: Diagnosis not present

## 2021-01-10 LAB — CUP PACEART REMOTE DEVICE CHECK
Battery Impedance: 257 Ohm
Battery Remaining Longevity: 131 mo
Battery Voltage: 2.79 V
Brady Statistic RV Percent Paced: 100 %
Date Time Interrogation Session: 20220407153815
Implantable Lead Implant Date: 20080311
Implantable Lead Implant Date: 20080311
Implantable Lead Location: 753859
Implantable Lead Location: 753860
Implantable Lead Model: 5076
Implantable Lead Model: 5076
Implantable Pulse Generator Implant Date: 20161206
Lead Channel Impedance Value: 661 Ohm
Lead Channel Impedance Value: 67 Ohm
Lead Channel Pacing Threshold Amplitude: 1 V
Lead Channel Pacing Threshold Pulse Width: 0.4 ms
Lead Channel Setting Pacing Amplitude: 2 V
Lead Channel Setting Pacing Pulse Width: 0.4 ms
Lead Channel Setting Sensing Sensitivity: 2.8 mV

## 2021-01-17 NOTE — Progress Notes (Signed)
Remote pacemaker transmission.   

## 2021-05-06 DEATH — deceased
